# Patient Record
Sex: Male | Born: 1965
Health system: Southern US, Community
[De-identification: ages and names within clinical notes are randomized; demographics above are authoritative.]

## PROBLEM LIST (undated history)

## (undated) ENCOUNTER — Emergency Department (HOSPITAL_COMMUNITY): Admission: EM | Discharge status: Absent without leave | Payer: 59

## (undated) DIAGNOSIS — Z8659 Personal history of other mental and behavioral disorders: Secondary | ICD-10-CM

## (undated) DIAGNOSIS — E559 Vitamin D deficiency, unspecified: Secondary | ICD-10-CM

## (undated) DIAGNOSIS — G8929 Other chronic pain: Secondary | ICD-10-CM

## (undated) DIAGNOSIS — M5440 Lumbago with sciatica, unspecified side: Secondary | ICD-10-CM

## (undated) DIAGNOSIS — S069XAA Unspecified intracranial injury with loss of consciousness status unknown, initial encounter: Secondary | ICD-10-CM

## (undated) DIAGNOSIS — M549 Dorsalgia, unspecified: Secondary | ICD-10-CM

## (undated) DIAGNOSIS — S069X9A Unspecified intracranial injury with loss of consciousness of unspecified duration, initial encounter: Secondary | ICD-10-CM

## (undated) HISTORY — DX: Lumbago with sciatica, unspecified side: M54.40

## (undated) HISTORY — PX: BACK SURGERY: SHX140

---

## 2002-11-24 ENCOUNTER — Emergency Department (HOSPITAL_COMMUNITY): Admission: EM | Admit: 2002-11-24 | Discharge: 2002-11-25 | Payer: Self-pay | Admitting: Emergency Medicine

## 2002-11-30 ENCOUNTER — Emergency Department (HOSPITAL_COMMUNITY): Admission: EM | Admit: 2002-11-30 | Discharge: 2002-11-30 | Payer: Self-pay | Admitting: Emergency Medicine

## 2002-11-30 ENCOUNTER — Encounter: Payer: Self-pay | Admitting: Emergency Medicine

## 2004-08-14 ENCOUNTER — Emergency Department (HOSPITAL_COMMUNITY): Admission: EM | Admit: 2004-08-14 | Discharge: 2004-08-15 | Payer: Self-pay

## 2010-05-24 ENCOUNTER — Emergency Department (HOSPITAL_COMMUNITY): Admission: EM | Admit: 2010-05-24 | Discharge: 2010-05-24 | Payer: Self-pay | Admitting: Emergency Medicine

## 2010-05-29 ENCOUNTER — Emergency Department (HOSPITAL_COMMUNITY): Admission: EM | Admit: 2010-05-29 | Discharge: 2010-05-29 | Payer: Self-pay | Admitting: Emergency Medicine

## 2010-09-28 ENCOUNTER — Emergency Department (HOSPITAL_COMMUNITY)
Admission: EM | Admit: 2010-09-28 | Discharge: 2010-09-28 | Payer: 59 | Source: Home / Self Care | Admitting: Emergency Medicine

## 2012-06-11 ENCOUNTER — Emergency Department (HOSPITAL_COMMUNITY)
Admission: EM | Admit: 2012-06-11 | Discharge: 2012-06-11 | Disposition: A | Discharge status: Home Health | Payer: Worker's Compensation | Attending: Emergency Medicine | Admitting: Emergency Medicine

## 2012-06-11 ENCOUNTER — Emergency Department (HOSPITAL_COMMUNITY): Payer: Worker's Compensation

## 2012-06-11 ENCOUNTER — Encounter (HOSPITAL_COMMUNITY): Payer: Self-pay | Admitting: *Deleted

## 2012-06-11 DIAGNOSIS — F17200 Nicotine dependence, unspecified, uncomplicated: Secondary | ICD-10-CM | POA: Insufficient documentation

## 2012-06-11 DIAGNOSIS — S46912A Strain of unspecified muscle, fascia and tendon at shoulder and upper arm level, left arm, initial encounter: Secondary | ICD-10-CM

## 2012-06-11 DIAGNOSIS — W010XXA Fall on same level from slipping, tripping and stumbling without subsequent striking against object, initial encounter: Secondary | ICD-10-CM | POA: Insufficient documentation

## 2012-06-11 DIAGNOSIS — Y93G1 Activity, food preparation and clean up: Secondary | ICD-10-CM | POA: Insufficient documentation

## 2012-06-11 DIAGNOSIS — IMO0002 Reserved for concepts with insufficient information to code with codable children: Secondary | ICD-10-CM | POA: Insufficient documentation

## 2012-06-11 DIAGNOSIS — Y9269 Other specified industrial and construction area as the place of occurrence of the external cause: Secondary | ICD-10-CM | POA: Insufficient documentation

## 2012-06-11 MED ORDER — DIAZEPAM 5 MG PO TABS
5.0000 mg | ORAL_TABLET | Freq: Once | ORAL | Status: AC
  Administered 2012-06-11: 5 mg via ORAL
  Filled 2012-06-11: qty 1

## 2012-06-11 MED ORDER — IBUPROFEN 800 MG PO TABS
800.0000 mg | ORAL_TABLET | Freq: Once | ORAL | Status: AC
  Administered 2012-06-11: 800 mg via ORAL
  Filled 2012-06-11: qty 1

## 2012-06-11 MED ORDER — OXYCODONE-ACETAMINOPHEN 5-325 MG PO TABS
2.0000 | ORAL_TABLET | Freq: Once | ORAL | Status: AC
  Administered 2012-06-11: 2 via ORAL
  Filled 2012-06-11: qty 2

## 2012-06-11 MED ORDER — DIAZEPAM 5 MG PO TABS: 5.0000 mg | ORAL_TABLET | Freq: Three times a day (TID) | ORAL | Status: DC | PRN

## 2012-06-11 MED ORDER — IBUPROFEN 800 MG PO TABS: 800.0000 mg | ORAL_TABLET | Freq: Three times a day (TID) | ORAL | Status: DC

## 2012-06-11 MED ORDER — OXYCODONE-ACETAMINOPHEN 5-325 MG PO TABS: 2.0000 | ORAL_TABLET | ORAL | Status: DC | PRN

## 2012-06-11 NOTE — ED Provider Notes (Signed)
History     CSN: [id number]  Arrival date & time 06/11/12  0120   First MD Initiated Contact with Patient 06/11/12 0250      Chief Complaint  Patient presents with  . Shoulder Injury    (Consider location/radiation/quality/duration/timing/severity/associated sxs/prior treatment) HPI 46 year old male presents to the emergency department after having fall at work. Patient reports they were cleaning the kitchen when he slipped on some water landing on his left side. He is complaining of pain to his left shoulder, and left paraspinal upper back area. No numbness no weakness. Patient is right-hand dominant. Patient denies any numbness or tingling down to his arm. No shortness of breath no other injuries  History reviewed. No pertinent past medical history.  History reviewed. No pertinent past surgical history.  No family history on file.  History  Substance Use Topics  . Smoking status: Current Every Day Smoker  . Smokeless tobacco: Not on file  . Alcohol Use: No      Review of Systems  All other systems reviewed and are negative.    Allergies  Review of patient's allergies indicates no known allergies.  Home Medications   Current Outpatient Rx  Name Route Sig Dispense Refill  . DIAZEPAM 5 MG PO TABS Oral Take 1 tablet (5 mg total) by mouth every 8 (eight) hours as needed for anxiety (muscle spasm). 15 tablet 0  . IBUPROFEN 800 MG PO TABS Oral Take 1 tablet (800 mg total) by mouth 3 (three) times daily. 21 tablet 0  . OXYCODONE-ACETAMINOPHEN 5-325 MG PO TABS Oral Take 2 tablets by mouth every 4 (four) hours as needed for pain. 20 tablet 0    BP 155/82  Pulse 81  Temp 97.7 F (36.5 C) (Oral)  Resp 18  SpO2 95%  Physical Exam  Nursing note and vitals reviewed. Constitutional: He appears well-developed and well-nourished. He appears distressed (Uncomfortable appearing).  HENT:  Head: Normocephalic and atraumatic.  Nose: Nose normal.  Mouth/Throat: Oropharynx is  clear and moist.  Eyes: EOM are normal. Pupils are equal, round, and reactive to light.  Neck: Normal range of motion. Neck supple. No JVD present. No tracheal deviation present. No thyromegaly present.       Left sided paraspinal muscle tenderness with palpation and spasm noted no tenderness in midline, no step-off no crepitus  Pulmonary/Chest: No stridor.  Musculoskeletal: He exhibits tenderness (tenderness across left shoulder mainly across rotator cuff muscles. No bony tenderness, limited range of motion secondary to pain but no crepitus or deformity). He exhibits no edema.  Lymphadenopathy:    He has no cervical adenopathy.    ED Course  Procedures (including critical care time)  Labs Reviewed - No data to display Dg Shoulder Left  06/11/2012  *RADIOLOGY REPORT*  Clinical Data: Fall.  Landed on left shoulder.  Shoulder pain.  LEFT SHOULDER - 2+ VIEW  Comparison: None.  Findings: No evidence for fracture.  No findings to suggest shoulder separation or dislocation. No worrisome lytic or sclerotic osseous lesion.  IMPRESSION: No acute bony findings.   Original Report Authenticated By: ERIC A. MANSELL, M.D.      1. Left shoulder strain       MDM  46 year old male with left shoulder pain. Suspect shoulder strain after fall. We'll place in sling, give pain and spasm medication. Patient instructed to followup with his primary care doctor and/or orthopedics for further evaluation if symptoms are ongoing past a week time        [place]  Kellie Simmering, MD 06/11/12 [phone]

## 2012-06-11 NOTE — ED Notes (Signed)
Ortho paged for sling immobilizer  

## 2012-06-11 NOTE — ED Notes (Signed)
The pt fell earl;ier today and injured his lt shoulder.  C/o pain

## 2012-06-14 ENCOUNTER — Emergency Department (HOSPITAL_COMMUNITY): Payer: Worker's Compensation

## 2012-06-14 ENCOUNTER — Emergency Department (HOSPITAL_COMMUNITY)
Admission: EM | Admit: 2012-06-14 | Discharge: 2012-06-14 | Disposition: A | Discharge status: Home / Self Care | Payer: Worker's Compensation | Attending: Emergency Medicine | Admitting: Emergency Medicine

## 2012-06-14 ENCOUNTER — Encounter (HOSPITAL_COMMUNITY): Payer: Self-pay | Admitting: Emergency Medicine

## 2012-06-14 DIAGNOSIS — F17200 Nicotine dependence, unspecified, uncomplicated: Secondary | ICD-10-CM | POA: Insufficient documentation

## 2012-06-14 DIAGNOSIS — S161XXA Strain of muscle, fascia and tendon at neck level, initial encounter: Secondary | ICD-10-CM

## 2012-06-14 DIAGNOSIS — S139XXA Sprain of joints and ligaments of unspecified parts of neck, initial encounter: Secondary | ICD-10-CM | POA: Insufficient documentation

## 2012-06-14 DIAGNOSIS — W010XXA Fall on same level from slipping, tripping and stumbling without subsequent striking against object, initial encounter: Secondary | ICD-10-CM | POA: Insufficient documentation

## 2012-06-14 DIAGNOSIS — Y9269 Other specified industrial and construction area as the place of occurrence of the external cause: Secondary | ICD-10-CM | POA: Insufficient documentation

## 2012-06-14 MED ORDER — DIAZEPAM 5 MG PO TABS: 5.0000 mg | ORAL_TABLET | Freq: Three times a day (TID) | ORAL | Status: DC | PRN

## 2012-06-14 MED ORDER — IBUPROFEN 600 MG PO TABS: 600.0000 mg | ORAL_TABLET | Freq: Four times a day (QID) | ORAL | Status: DC | PRN

## 2012-06-14 NOTE — ED Notes (Signed)
Pt reports fell Tuesday night, pt seen here at that time. Pt reports only had his left shoulder x-rayed at that time. Pt c/o pain from low neck down spine. Pt also c/o pain to back of left leg.

## 2012-06-14 NOTE — ED Provider Notes (Signed)
History     CSN: [id number]  Arrival date & time 06/14/12  4098   First MD Initiated Contact with Patient 06/14/12 1009      Chief Complaint  Patient presents with  . Back Pain    (Consider location/radiation/quality/duration/timing/severity/associated sxs/prior treatment) HPI Comments: DOMENIQUE SOUTHERS is a 46 y.o. Male who presents with complaint of neck pain. Pt states he fell 3 days ago on some grease at work, states landed onto left shoulder states was seen here in ED, had x-rays done which were negative. States he also complained of neck pain but no x-rays were taken. States neck pain has been worsening since. States pain is midline radiating into right upper back. Pain worsened with movement of the head. Denies numbness or weakness in hands or legs. Taking percocet, valium, ibuprofen with no relief.    History reviewed. No pertinent past medical history.  History reviewed. No pertinent past surgical history.  No family history on file.  History  Substance Use Topics  . Smoking status: Current Every Day Smoker    Types: Cigarettes  . Smokeless tobacco: Not on file  . Alcohol Use: No      Review of Systems  Constitutional: Negative for fever and chills.  HENT: Positive for neck pain and neck stiffness.   Respiratory: Negative.   Cardiovascular: Negative.   Musculoskeletal: Positive for back pain.  Skin: Negative.   Neurological: Negative for dizziness, weakness, numbness and headaches.    Allergies  Review of patient's allergies indicates no known allergies.  Home Medications   Current Outpatient Rx  Name Route Sig Dispense Refill  . TENSION HEADACHE RELIEF PO Oral Take 1 tablet by mouth daily as needed. For headache    . DIAZEPAM 5 MG PO TABS Oral Take 5 mg by mouth every 8 (eight) hours as needed. For muscle spasms    . IBUPROFEN 800 MG PO TABS Oral Take 800 mg by mouth every 8 (eight) hours as needed. For pain    . OXYCODONE-ACETAMINOPHEN 5-325 MG PO  TABS Oral Take 2 tablets by mouth every 4 (four) hours as needed. For pain      BP 117/68  Pulse 70  Temp 97.8 F (36.6 C) (Oral)  Resp 20  SpO2 96%  Physical Exam  Constitutional: He is oriented to person, place, and time. He appears well-developed and well-nourished. No distress.  HENT:  Head: Normocephalic.  Eyes: Conjunctivae normal are normal.  Neck: Normal range of motion. Neck supple.       Cervical spine tender over midline and left paracervical muscles. No bruising, swelling, deformity  Cardiovascular: Normal rate, regular rhythm and normal heart sounds.   Pulmonary/Chest: Effort normal and breath sounds normal. No respiratory distress. He has no wheezes. He has no rales.  Musculoskeletal: He exhibits no edema.  Neurological: He is alert and oriented to person, place, and time.       Equal upper and lower extremity strength bilaterally. 5/5 and equal grip strength  Skin: Skin is warm and dry.  Psychiatric: He has a normal mood and affect.    ED Course  Procedures (including critical care time)  Pt with a fall 3 days ago. Here with neck pain. Will get x-rays. Pt is neurovascularly intact. Ambulatory. Ran out of pain meds at home.   No results found for this or any previous visit. Dg Cervical Spine Complete  06/14/2012  *RADIOLOGY REPORT*  Clinical Data: Back pain.  Neck pain.  Fell 4 days ago.  Weakness, numbness in the left hand, left leg pain.  CERVICAL SPINE - COMPLETE 4+ VIEW  Comparison: 05/24/2010  Findings: There are moderate degenerative changes in the mid cervical spine.  Disc height loss and osteophytes are present especially at C5-6 and C6-7.  There is no evidence for acute fracture or subluxation.  Lung apices are clear.  IMPRESSION: Moderate degenerative changes. No evidence for acute  abnormality.   Original Report Authenticated By: Patterson Hammersmith, M.D.    Negative c-spine except for probably degenerative disc disease. Pt to d/c home with more medications  and close follow up. He is neurovascularly intact.        1. Cervical strain       MDM          Lottie Mussel, PA 06/14/12 1641

## 2012-06-15 NOTE — ED Provider Notes (Signed)
Medical screening examination/treatment/procedure(s) were performed by non-physician practitioner and as supervising physician I was immediately available for consultation/collaboration.   Loren Racer, MD 06/15/12 0730

## 2012-11-15 ENCOUNTER — Encounter (HOSPITAL_COMMUNITY): Payer: Self-pay | Admitting: Emergency Medicine

## 2012-11-15 ENCOUNTER — Emergency Department (HOSPITAL_COMMUNITY)
Admission: EM | Admit: 2012-11-15 | Discharge: 2012-11-16 | Disposition: A | Discharge status: Home / Self Care | Payer: BC Managed Care – PPO | Attending: Emergency Medicine | Admitting: Emergency Medicine

## 2012-11-15 ENCOUNTER — Emergency Department (HOSPITAL_COMMUNITY): Payer: BC Managed Care – PPO

## 2012-11-15 DIAGNOSIS — W009XXA Unspecified fall due to ice and snow, initial encounter: Secondary | ICD-10-CM

## 2012-11-15 DIAGNOSIS — Y9289 Other specified places as the place of occurrence of the external cause: Secondary | ICD-10-CM | POA: Insufficient documentation

## 2012-11-15 DIAGNOSIS — F17200 Nicotine dependence, unspecified, uncomplicated: Secondary | ICD-10-CM | POA: Insufficient documentation

## 2012-11-15 DIAGNOSIS — W010XXA Fall on same level from slipping, tripping and stumbling without subsequent striking against object, initial encounter: Secondary | ICD-10-CM | POA: Insufficient documentation

## 2012-11-15 DIAGNOSIS — S99929A Unspecified injury of unspecified foot, initial encounter: Secondary | ICD-10-CM | POA: Insufficient documentation

## 2012-11-15 DIAGNOSIS — S99912A Unspecified injury of left ankle, initial encounter: Secondary | ICD-10-CM

## 2012-11-15 DIAGNOSIS — Y9329 Activity, other involving ice and snow: Secondary | ICD-10-CM | POA: Insufficient documentation

## 2012-11-15 DIAGNOSIS — S8990XA Unspecified injury of unspecified lower leg, initial encounter: Secondary | ICD-10-CM | POA: Insufficient documentation

## 2012-11-15 NOTE — ED Notes (Signed)
Pt c/o left ankle pain, reports he slipped on the ice today. Pt has swelling to left ankle. Pt in nad, skin warm and dry, resp e/u.

## 2012-11-15 NOTE — ED Provider Notes (Signed)
History    This chart was scribed for non-physician practitioner working with Vincent Skene, MD by [name], ED Scribe. This patient was seen in room TR10C/TR10C and the patient's care was started at 11:53 PM.    CSN: [id number]  Arrival date & time 11/15/12  5784   First MD Initiated Contact with Patient 11/15/12 2341      Chief Complaint  Patient presents with  . Fall     The history is provided by the patient. No language interpreter was used.  [name] is a 47 y.o. male who presents to the Emergency Department complaining of left ankle pain and swelling with sudden onset at 11:00 AM today when he slipped on ice and heard a "pop."  Pt has used ice and ibuprofen at home with no improvements to pain.  Unable to bear weight secondary to pain.  No further injuries as a result.     History reviewed. No pertinent past medical history.  History reviewed. No pertinent past surgical history.  No family history on file.  History  Substance Use Topics  . Smoking status: Current Every Day Smoker    Types: Cigarettes  . Smokeless tobacco: Not on file  . Alcohol Use: No      Review of Systems  Musculoskeletal: Positive for arthralgias (left ankle).  Skin: Negative for wound.  Neurological: Negative for numbness.  All other systems reviewed and are negative.    Allergies  Review of patient's allergies indicates no known allergies.  Home Medications   Current Outpatient Rx  Name  Route  Sig  Dispense  Refill  . ibuprofen (ADVIL,MOTRIN) 800 MG tablet   Oral   Take 800 mg by mouth every 8 (eight) hours as needed. For pain         . oxyCODONE-acetaminophen (PERCOCET/ROXICET) 5-325 MG per tablet   Oral   Take 2 tablets by mouth every 4 (four) hours as needed. For pain         . naproxen (NAPROSYN) 500 MG tablet   Oral   Take 1 tablet (500 mg total) by mouth 2 (two) times daily with a meal.   30 tablet   0   . oxyCODONE-acetaminophen (PERCOCET) 5-325 MG  per tablet   Oral   Take 1 tablet by mouth every 4 (four) hours as needed for pain.   10 tablet   0     BP 138/95  Pulse 84  Temp(Src) 98.5 F (36.9 C) (Oral)  Resp 18  SpO2 96%  Physical Exam  Nursing note and vitals reviewed. Constitutional: He is oriented to person, place, and time. He appears well-developed and well-nourished. No distress.  HENT:  Head: Normocephalic and atraumatic.  Eyes: Conjunctivae and EOM are normal.  Neck: Normal range of motion. Neck supple.  Cardiovascular: Normal rate, regular rhythm and normal heart sounds.   Intact distal pulses, capillary refill < 3 seconds  Pulmonary/Chest: Effort normal and breath sounds normal. No respiratory distress. He has no wheezes.  Musculoskeletal:  Swelling and pain to palpation on lateral malleolus, decreased ROM of left ankle secondary to pain, left knee good ROM.  Neurological: He is alert and oriented to person, place, and time.  No sensory deficit  Skin: Skin is warm. He is not diaphoretic.  Skin intact, no tenting  Psychiatric: He has a normal mood and affect. His behavior is normal.    ED Course  Procedures (including critical care time) DIAGNOSTIC STUDIES: Oxygen Saturation is 96% on room  air, adequate by my interpretation.    COORDINATION OF CARE: 11:56 PM- Patient informed of clinical course, understands medical decision-making process, and agrees with plan.   Dg Ankle Complete Left  11/15/2012  *RADIOLOGY REPORT*  Clinical Data: Fall, left ankle pain  LEFT ANKLE COMPLETE - 3+ VIEW  Comparison: Concurrently obtained radiographs of the left foot.  Findings: Soft tissue swelling is noted about the ankle, particularly about the medial malleolus.  No definite acute fracture, malalignment or ankle joint effusion.  Osseous mineralization is within normal limits.  IMPRESSION:  1.  No acute fracture, malalignment or joint effusion. 2.  Soft tissue swelling about the ankle anteriorly and around the medial  malleolus.   Original Report Authenticated By: [name], M.D.    Dg Foot Complete Left  11/15/2012  *RADIOLOGY REPORT*  Clinical Data: Fall, left ankle and foot pain  LEFT FOOT - COMPLETE 3+ VIEW  Comparison: Concurrently obtained radiographs of the ankle  Findings: No acute fracture, or malalignment.  Bony mineralization is within normal limits.  Soft tissue swelling noted anterior to the ankle joint.  Small plantar calcaneal spur.  IMPRESSION: No acute osseous abnormality.   Original Report Authenticated By: [name], M.D.      1. Fall from slipping on ice, initial encounter   2. Ankle injury, left, initial encounter       MDM  [name] presents with fall and L ankle pain.  Patient X-Ray negative for obvious fracture or dislocation. Pain managed in ED. Pt advised to follow up with orthopedics if symptoms persist for possibility of missed fracture diagnosis. Patient given brace while in ED, conservative therapy recommended and discussed. Patient will be dc home & is agreeable with above plan.  1. Medications: naproxyn, vicodin, usual home medications 2. Treatment: rest, drink plenty of fluids, compress, elevate, ice 3. Follow Up: Please followup with your primary doctor for discussion of your diagnoses and further evaluation after today's visit; if you do not have a primary care doctor use the resource guide provided to find one; follow-up with orthopedics  I personally performed the services described in this documentation, which was scribed in my presence. The recorded information has been reviewed and is accurate.        Vincent Client Muthersbaugh, PA-C 11/16/12 0021

## 2012-11-15 NOTE — ED Notes (Signed)
Patient here for fall, patient fell this morning at 11.  Patient now having foot and ankle pain on the left.  Swelling noted to left foot.

## 2012-11-16 MED ORDER — POTASSIUM CHLORIDE CRYS ER 20 MEQ PO TBCR
EXTENDED_RELEASE_TABLET | ORAL | Status: AC
  Filled 2012-11-16: qty 2

## 2012-11-16 MED ORDER — NAPROXEN 500 MG PO TABS: 500.0000 mg | ORAL_TABLET | Freq: Two times a day (BID) | ORAL | Status: DC

## 2012-11-16 MED ORDER — OXYCODONE-ACETAMINOPHEN 5-325 MG PO TABS
2.0000 | ORAL_TABLET | Freq: Once | ORAL | Status: AC
  Administered 2012-11-16: 2 via ORAL
  Filled 2012-11-16: qty 2

## 2012-11-16 MED ORDER — OXYCODONE-ACETAMINOPHEN 5-325 MG PO TABS: 1.0000 | ORAL_TABLET | ORAL | Status: DC | PRN

## 2012-11-16 NOTE — ED Notes (Signed)
Pt noted to be walking out once the ortho tech fitted him with his ankle brace and crutches.  This RN asked the pt what was wrong.  Pt stated he needed to go "I have been here for 9 hours.  I have to go.  I can't stay here anymore." RN noted pt was up for discharge.  Discharge paperwork given to pt.  Pt verbalized understanding of discharge and f/u; no additional questions by pt regarding d/c; e-signature obtained; resps e/u;

## 2012-11-18 NOTE — ED Provider Notes (Signed)
Medical screening examination/treatment/procedure(s) were performed by non-physician practitioner and as supervising physician I was immediately available for consultation/collaboration.  John-Adam Dolton Shaker, M.D.     John-Adam Jettson Crable, MD 11/18/12 0728 

## 2012-11-25 ENCOUNTER — Ambulatory Visit: Payer: Self-pay | Admitting: Emergency Medicine

## 2013-11-04 ENCOUNTER — Encounter (HOSPITAL_COMMUNITY): Payer: Self-pay | Admitting: Emergency Medicine

## 2013-11-04 ENCOUNTER — Emergency Department (HOSPITAL_COMMUNITY)
Admission: EM | Admit: 2013-11-04 | Discharge: 2013-11-04 | Disposition: A | Discharge status: Home / Self Care | Payer: PRIVATE HEALTH INSURANCE | Attending: Emergency Medicine | Admitting: Emergency Medicine

## 2013-11-04 ENCOUNTER — Emergency Department (HOSPITAL_COMMUNITY): Payer: PRIVATE HEALTH INSURANCE

## 2013-11-04 DIAGNOSIS — Z8639 Personal history of other endocrine, nutritional and metabolic disease: Secondary | ICD-10-CM | POA: Insufficient documentation

## 2013-11-04 DIAGNOSIS — S39012A Strain of muscle, fascia and tendon of lower back, initial encounter: Secondary | ICD-10-CM

## 2013-11-04 DIAGNOSIS — S335XXA Sprain of ligaments of lumbar spine, initial encounter: Secondary | ICD-10-CM | POA: Insufficient documentation

## 2013-11-04 DIAGNOSIS — Z791 Long term (current) use of non-steroidal anti-inflammatories (NSAID): Secondary | ICD-10-CM | POA: Insufficient documentation

## 2013-11-04 DIAGNOSIS — Y9389 Activity, other specified: Secondary | ICD-10-CM | POA: Insufficient documentation

## 2013-11-04 DIAGNOSIS — IMO0002 Reserved for concepts with insufficient information to code with codable children: Secondary | ICD-10-CM

## 2013-11-04 DIAGNOSIS — Y92410 Unspecified street and highway as the place of occurrence of the external cause: Secondary | ICD-10-CM | POA: Insufficient documentation

## 2013-11-04 DIAGNOSIS — S239XXA Sprain of unspecified parts of thorax, initial encounter: Secondary | ICD-10-CM | POA: Insufficient documentation

## 2013-11-04 DIAGNOSIS — F17200 Nicotine dependence, unspecified, uncomplicated: Secondary | ICD-10-CM | POA: Insufficient documentation

## 2013-11-04 DIAGNOSIS — Z862 Personal history of diseases of the blood and blood-forming organs and certain disorders involving the immune mechanism: Secondary | ICD-10-CM | POA: Insufficient documentation

## 2013-11-04 HISTORY — DX: Vitamin D deficiency, unspecified: E55.9

## 2013-11-04 MED ORDER — HYDROCODONE-ACETAMINOPHEN 5-325 MG PO TABS: 1.0000 | ORAL_TABLET | Freq: Four times a day (QID) | ORAL | Status: DC | PRN

## 2013-11-04 MED ORDER — IBUPROFEN 800 MG PO TABS: 800.0000 mg | ORAL_TABLET | Freq: Three times a day (TID) | ORAL | Status: DC | PRN
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
## 2013-11-04 NOTE — ED Provider Notes (Signed)
Medical screening examination/treatment/procedure(s) were performed by non-physician practitioner and as supervising physician I was immediately available for consultation/collaboration.  EKG Interpretation   None         Delice Bison Ahja Martello, DO 11/04/13 1352

## 2013-11-04 NOTE — Discharge Instructions (Signed)
Return here as needed. Follow up with your doctor. Ice and heat on your back. Your x-rays were normal.
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
## 2015-11-02 ENCOUNTER — Ambulatory Visit (INDEPENDENT_AMBULATORY_CARE_PROVIDER_SITE_OTHER): Payer: 59 | Admitting: Diagnostic Neuroimaging

## 2015-11-02 ENCOUNTER — Encounter: Payer: Self-pay | Admitting: Diagnostic Neuroimaging

## 2015-11-02 ENCOUNTER — Telehealth: Payer: Self-pay | Admitting: Diagnostic Neuroimaging

## 2015-11-02 VITALS — BP 125/75 | HR 70 | Ht 65.5 in | Wt 196.0 lb

## 2015-11-02 DIAGNOSIS — H538 Other visual disturbances: Secondary | ICD-10-CM | POA: Diagnosis not present

## 2015-11-02 DIAGNOSIS — H532 Diplopia: Secondary | ICD-10-CM

## 2015-11-02 DIAGNOSIS — M5441 Lumbago with sciatica, right side: Secondary | ICD-10-CM | POA: Diagnosis not present

## 2015-11-02 DIAGNOSIS — R202 Paresthesia of skin: Secondary | ICD-10-CM

## 2015-11-02 DIAGNOSIS — M5442 Lumbago with sciatica, left side: Secondary | ICD-10-CM | POA: Diagnosis not present

## 2015-11-02 DIAGNOSIS — R29898 Other symptoms and signs involving the musculoskeletal system: Secondary | ICD-10-CM | POA: Diagnosis not present

## 2015-11-02 DIAGNOSIS — R200 Anesthesia of skin: Secondary | ICD-10-CM

## 2015-11-02 DIAGNOSIS — R208 Other disturbances of skin sensation: Secondary | ICD-10-CM

## 2015-11-02 MED ORDER — ALPRAZOLAM 0.5 MG PO TABS
0.5000 mg | ORAL_TABLET | ORAL | Status: DC | PRN
Start: 2015-11-02 — End: 2017-01-12

## 2015-11-02 MED ORDER — GABAPENTIN 300 MG PO CAPS: 300.0000 mg | ORAL_CAPSULE | Freq: Three times a day (TID) | ORAL | Status: DC

## 2015-11-02 NOTE — Telephone Encounter (Signed)
Patient called, was seen today by Dr. Leta Baptist, forgot to ask about getting a back brace to help him while sitting and/or standing.

## 2015-11-02 NOTE — Patient Instructions (Signed)
Thank you for coming to see [place] at [facility]. I hope we have been able to provide you high quality care today.  You may receive a patient satisfaction survey over the next few weeks. We would appreciate your feedback and comments so that we may continue to improve ourselves and the health of our patients.  - I will check additional testing (MRI scans, labs) - I will send you for physical therapy evaluation - try gabapentin 387m at bedtime; increase up to three times per day   ~~~~~~~~~~~~~~~~~~~~~~~~~~~~~~~~~~~~~~~~~~~~~~~~~~~~~~~~~~~~~~~~~  DR. Laresa Oshiro'S GUIDE TO HAPPY AND HEALTHY LIVING These are some of my general health and wellness recommendations. Some of them may apply to you better than others. Please use common sense as you try these suggestions and feel free to ask me any questions.   ACTIVITY/FITNESS Mental, social, emotional and physical stimulation are very important for brain and body health. Try learning a new activity (arts, music, language, sports, games).  Keep moving your body to the best of your abilities. You can do this at home, inside or outside, the park, community center, gym or anywhere you like. Consider a physical therapist or personal trainer to get started. Consider the app Sworkit. Fitness trackers such as smart-watches, smart-phones or Fitbits can help as well.   NUTRITION Eat more plants: colorful vegetables, nuts, seeds and berries.  Eat less sugar, salt, preservatives and processed foods.  Avoid toxins such as cigarettes and alcohol.  Drink water when you are thirsty. Warm water with a slice of lemon is an excellent morning drink to start the day.  Consider these websites for more information The Nutrition Source (hhttps://www.henry-hernandez.biz/ [employer] Nutrition (wWindowBlog.ch   RELAXATION Consider practicing mindfulness meditation or other relaxation techniques such as deep  breathing, prayer, yoga, tai chi, massage. See website mindful.org or the apps Headspace or Calm to help get started.   SLEEP Try to get at least 7-8+ hours sleep per day. Regular exercise and reduced caffeine will help you sleep better. Practice good sleep hygeine techniques. See website sleep.org for more information.   PLANNING Prepare estate planning, living will, healthcare POA documents. Sometimes this is best planned with the help of an attorney. Theconversationproject.org and agingwithdignity.org are excellent resources.

## 2015-11-02 NOTE — Telephone Encounter (Signed)
SPoke with patient and informed him Dr Leta Baptist doesn't recommend back brace. Patient stated "back brace takes pressure off his back". Dr Leta Baptist stated he would recommend only if PT recommends. Patient stated he has already received call from PT and will discuss with PT when he sees them. He verbalized understanding, appreciation for call.

## 2015-11-02 NOTE — Progress Notes (Signed)
[facility]  PATIENT: [name] DOB: Jul 26, 1966  REFERRING CLINICIAN: Othelia Pulling HISTORY FROM: patient  REASON FOR VISIT: new consult    HISTORICAL  CHIEF COMPLAINT:  Chief Complaint  Patient presents with  . Lumbago with sciatica, left side    rm 7, New Patient    HISTORY OF PRESENT ILLNESS:   50 year old right-handed male here for evaluation of low back pain. Symptoms of been going on since 2011 or 2012. He would have intermittent flareups of symptoms once or twice a month which would be self-limited and resolve. Sometimes he would have pain rating his left leg, sometimes right leg. Sometimes he would have weakness in his legs lasting 1-2 months at time and then resolve. Over time he has noticed some intermittent symptoms of blurred vision, especially in the right eye. Sometimes he sees double vision. Sometimes he has to look at the TV using his left eye with his head tilted to the right. Patient has also noticed decreased sensation and numbness in his right arm and right leg. Patient was evaluated by neurology in [place] a few years ago, recommended to have MRI testing and further workup but patient never followed up as patient moved to [place].  Symptoms are worse if he is sitting for a long time and then he has to stand up and walk around. Patient having significant pain at this time. Patient was treated with intramuscular injection of steroid and pain reliever.  Patient reports remote history of low back disc herniation he thinks L5 level, with pain rating left leg, status post discectomy in 1980's, with good results.   REVIEW OF SYSTEMS: Full 14 system review of systems performed and notable only for numbness weakness joint pain aching muscles.  ALLERGIES: No Known Allergies  HOME MEDICATIONS: Outpatient Prescriptions Prior to Visit  Medication Sig Dispense Refill  . Cholecalciferol (VITAMIN D PO) Take 1 capsule by mouth daily.  Reported on 11/02/2015    . HYDROcodone-acetaminophen (NORCO/VICODIN) 5-325 MG per tablet Take 1 tablet by mouth every 6 (six) hours as needed for moderate pain. (Patient not taking: Reported on 11/02/2015) 15 tablet 0  . ibuprofen (ADVIL,MOTRIN) 800 MG tablet Take 1 tablet (800 mg total) by mouth every 8 (eight) hours as needed. (Patient not taking: Reported on 11/02/2015) 21 tablet 0  . Menthol, Topical Analgesic, (BIOFREEZE EX) Apply 1 application topically 2 (two) times daily as needed (back pain).    . Naproxen Sodium (ALEVE PO) Take 2 tablets by mouth daily as needed.     No facility-administered medications prior to visit.    PAST MEDICAL HISTORY: Past Medical History  Diagnosis Date  . Vitamin D deficiency   . Lumbago with sciatica     left side    PAST SURGICAL HISTORY: Past Surgical History  Procedure Laterality Date  . Back surgery      yrs ago    FAMILY HISTORY: Family History  Problem Relation Age of Onset  . Hypothyroidism Mother   . Hypertension Father     SOCIAL HISTORY:  Social History   Social History  . Marital Status: Married    Spouse Name: [name]  . Number of Children: 4  . Years of Education: 12   Occupational History  .      unemployed   Social History Main Topics  . Smoking status: Current Every Day Smoker    Types: Cigarettes  . Smokeless tobacco: Not on file     Comment: 11/02/15 3-4 cigs/day,  trying to quit  . Alcohol Use: No  . Drug Use: No  . Sexual Activity: Not on file   Other Topics Concern  . Not on file   Social History Narrative   Lives at home wife, children   Caffeine use- coffee 2-3 cups      PHYSICAL EXAM  GENERAL EXAM/CONSTITUTIONAL: Vitals:  Filed Vitals:   11/02/15 0820  BP: 125/75  Pulse: 70  Height: 5' 5.5" (1.664 m)  Weight: 196 lb (88.905 kg)     Body mass index is 32.11 kg/(m^2).  Visual Acuity Screening   Right eye Left eye Both eyes  Without correction: 20/50 20/50   With correction:         Patient is in no distress; well developed, nourished and groomed  [place] FLEX  CARDIOVASCULAR:  Examination of carotid arteries is normal; no carotid bruits  Regular rate and rhythm, no murmurs  Examination of peripheral vascular system by observation and palpation is normal  EYES:  Ophthalmoscopic exam of optic discs and posterior segments is normal; no papilledema or hemorrhages  MUSCULOSKELETAL:  Gait, strength, tone, movements noted in Neurologic exam below  NEUROLOGIC: MENTAL STATUS:  No flowsheet data found.  awake, alert, oriented to person, place and time  recent and remote memory intact  normal attention and concentration  language fluent, comprehension intact, naming intact,   fund of knowledge appropriate  CRANIAL NERVE:   2nd - no papilledema on fundoscopic exam  2nd, 3rd, 4th, 6th - pupils equal and reactive to light, visual fields full to confrontation, extraocular muscles --> HORIZONTAL BINOCULAR DOUBLE VISION IN PRIMARY GAZE; RIGHT AND LEFT LATERAL GAZE NORMAL; no nystagmus; LIGHT SENSITIVITY IN RIGHT EYE; BLURRED VISION IN RIGHT EYE  5th - facial sensation symmetric  7th - facial strength symmetric  8th - hearing intact  9th - palate elevates symmetrically, uvula midline  11th - shoulder shrug symmetric  12th - tongue protrusion midline  MOTOR:   normal bulk; DECR TONE IN BLE; full strength in the BUE EXCEPT DECR RIGHT HAND FINGER ABDUCTION; BLE (HIP FLEX 3; KNEE EXT 3-4; KNEE FLEX 3-4; RIGHT FOOT DORSIFLEX 2; LEFT FOOT DORSIFLEX 3; RIGHT TOE EXT 0; LEFT TOE EXT 2)  SENSORY:   DECR IN RUE COMPARED TO LUE; SIG DECR IN BLE (RIGHT FOOT WORSE THAN LEFT) TO ALL MODALITIES  COORDINATION:   finger-nose-finger, fine finger movements normal  REFLEXES:   deep tendon reflexes --> BUE 1; BLE 0; DOWN GOING TOES  GAIT/STATION:   ANTALGIC GAIT; CANNOT WALK ON TOES OR HEELS; romberg is  negative    DIAGNOSTIC DATA (LABS, IMAGING, TESTING) - I reviewed patient records, labs, notes, testing and imaging myself where available.  No results found for: WBC, HGB, HCT, MCV, PLT No results found for: NA, K, CL, CO2, GLUCOSE, BUN, CREATININE, CALCIUM, PROT, ALBUMIN, AST, ALT, ALKPHOS, BILITOT, GFRNONAA, GFRAA No results found for: CHOL, HDL, LDLCALC, LDLDIRECT, TRIG, CHOLHDL No results found for: HGBA1C No results found for: VITAMINB12 No results found for: TSH   11/04/13 xray lumbar [I reviewed images myself and agree with interpretation. -VRP]  1. Lower lumbar spondylosis and degenerative disc disease. Stable mild retrolisthesis at L3-4 and L4-5. No acute bony findings.  11/04/13 xray thoracic [I reviewed images myself and agree with interpretation. -VRP]  1. No acute thoracic spine findings. Thoracic spondylosis and degenerative disc disease noted. 2. Probably incidental exaggerated curvature of the right clavicle. If the patient has pain over the right  clavicle then dedicated shoulder or clavicular imaging would be recommended.      ASSESSMENT AND PLAN  50 y.o. year old male here with history of lumbar spine disc herniation status post surgery in the 1980s, now with increasing pain in the low back radiating to left leg since 2012. Patient also reports right eye blurred vision, binocular double vision, right arm and leg numbness, and intermittent lower extremity weakness episodes lasting 1-2 months at a time. Symptoms suspicious for central nervous system pathology in addition to lumbar spine pathology. Will check additional testing with MRI of the brain and lumbar spine. Will check additional lab testing as well. Symptomatically we will try to help patient with gabapentin and physical therapy evaluation.   Ddx: lumbar radiculopathy / spinal stenosis, neuropathy, cervical radiculopathy, CNS autoimmune/inflamm, metabolic  1. Bilateral low back pain with sciatica, sciatica  laterality unspecified   2. Blurred vision, right eye   3. Weakness of both lower extremities   4. Lower extremity numbness   5. Numbness and tingling in right hand   6. Double vision with both eyes open      PLAN: - I will check additional testing (MRI scans, labs) - I will send you for physical therapy evaluation - try gabapentin 300mg  at bedtime; increase up to three times per day   Orders Placed This Encounter  Procedures  . MR Brain W Wo Contrast  . MR Lumbar Spine Wo Contrast  . CBC with Differential/Platelet  . Comprehensive metabolic panel  . Hemoglobin A1c  . Vitamin B12  . VITAMIN D 25 Hydroxy (Vit-D Deficiency, Fractures)  . TSH  . Ambulatory referral to Physical Therapy   Meds ordered this encounter  Medications  . gabapentin (NEURONTIN) 300 MG capsule    Sig: Take 1 capsule (300 mg total) by mouth 3 (three) times daily.    Dispense:  90 capsule    Refill:  11  . ALPRAZolam (XANAX) 0.5 MG tablet    Sig: Take 1 tablet (0.5 mg total) by mouth as needed for anxiety (for sedation before MRI scan; take 1 hour before scan; may repeat 15 min before scan).    Dispense:  3 tablet    Refill:  0   Return in about 1 month (around 11/30/2015).  I reviewed images, labs, notes, records myself. I summarized findings and reviewed with patient, for this high risk condition (low back pain; lower ext weakness; blurred vision; double vision; possible CNS pathology) requiring high complexity decision making.    Penni Bombard, MD [id number], [id number] AM Certified in Neurology, Neurophysiology and Neuroimaging  [facility] [address], [place], [place] [address] [phone]

## 2015-11-03 ENCOUNTER — Telehealth: Payer: Self-pay | Admitting: *Deleted

## 2015-11-03 LAB — CBC WITH DIFFERENTIAL/PLATELET
BASOS ABS: 0 10*3/uL (ref 0.0–0.2)
Basos: 0 %
EOS (ABSOLUTE): 0.2 10*3/uL (ref 0.0–0.4)
Eos: 3 %
Hematocrit: 44.1 % (ref 37.5–51.0)
Hemoglobin: 14.6 g/dL (ref 12.6–17.7)
IMMATURE GRANS (ABS): 0 10*3/uL (ref 0.0–0.1)
Immature Granulocytes: 0 %
LYMPHS ABS: 2.7 10*3/uL (ref 0.7–3.1)
LYMPHS: 47 %
MCH: 28 pg (ref 26.6–33.0)
MCHC: 33.1 g/dL (ref 31.5–35.7)
MCV: 85 fL (ref 79–97)
MONOCYTES: 11 %
Monocytes Absolute: 0.6 10*3/uL (ref 0.1–0.9)
NEUTROS ABS: 2.3 10*3/uL (ref 1.4–7.0)
Neutrophils: 39 %
PLATELETS: 243 10*3/uL (ref 150–379)
RBC: 5.21 x10E6/uL (ref 4.14–5.80)
RDW: 14.9 % (ref 12.3–15.4)
WBC: 5.8 10*3/uL (ref 3.4–10.8)

## 2015-11-03 LAB — COMPREHENSIVE METABOLIC PANEL
A/G RATIO: 1.7 (ref 1.1–2.5)
ALBUMIN: 4.5 g/dL (ref 3.5–5.5)
ALT: 26 IU/L (ref 0–44)
AST: 17 IU/L (ref 0–40)
Alkaline Phosphatase: 78 IU/L (ref 39–117)
BILIRUBIN TOTAL: 0.6 mg/dL (ref 0.0–1.2)
BUN / CREAT RATIO: 15 (ref 9–20)
BUN: 13 mg/dL (ref 6–24)
CHLORIDE: 99 mmol/L (ref 96–106)
CO2: 25 mmol/L (ref 18–29)
Calcium: 9.3 mg/dL (ref 8.7–10.2)
Creatinine, Ser: 0.84 mg/dL (ref 0.76–1.27)
GFR calc non Af Amer: 104 mL/min/{1.73_m2} (ref >59)
GFR, EST AFRICAN AMERICAN: 120 mL/min/{1.73_m2} (ref >59)
GLOBULIN, TOTAL: 2.6 g/dL (ref 1.5–4.5)
GLUCOSE: 99 mg/dL (ref 65–99)
POTASSIUM: 4.6 mmol/L (ref 3.5–5.2)
Sodium: 138 mmol/L (ref 134–144)
TOTAL PROTEIN: 7.1 g/dL (ref 6.0–8.5)

## 2015-11-03 LAB — HEMOGLOBIN A1C
Est. average glucose Bld gHb Est-mCnc: 123 mg/dL
Hgb A1c MFr Bld: 5.9 % — ABNORMAL HIGH (ref 4.8–5.6)

## 2015-11-03 LAB — VITAMIN B12: Vitamin B-12: 206 pg/mL — ABNORMAL LOW (ref 211–946)

## 2015-11-03 LAB — VITAMIN D 25 HYDROXY (VIT D DEFICIENCY, FRACTURES): Vit D, 25-Hydroxy: 13.9 ng/mL — ABNORMAL LOW (ref 30.0–100.0)

## 2015-11-03 LAB — TSH: TSH: 1.5 u[IU]/mL (ref 0.450–4.500)

## 2015-11-03 NOTE — Telephone Encounter (Signed)
Spoke with patient and informed him per Dr Leta Baptist, his lab results are normal with these exceptions: Vit B12 level low, Vit D low, Hgb A1c elevated. Advised he take multivitamin or Vit B12 OTC and begin taking OTC Vit D 1000 units daily,  He has appointment with PCP next month and will discuss lab results with PCP then. PCP is in [facility]. Patient verbalized understanding, appreciation.

## 2015-11-09 ENCOUNTER — Ambulatory Visit: Payer: 59 | Attending: Diagnostic Neuroimaging | Admitting: Physical Therapy

## 2015-11-09 DIAGNOSIS — R269 Unspecified abnormalities of gait and mobility: Secondary | ICD-10-CM | POA: Insufficient documentation

## 2015-11-09 DIAGNOSIS — M5440 Lumbago with sciatica, unspecified side: Secondary | ICD-10-CM | POA: Diagnosis not present

## 2015-11-09 DIAGNOSIS — R29898 Other symptoms and signs involving the musculoskeletal system: Secondary | ICD-10-CM | POA: Insufficient documentation

## 2015-11-09 NOTE — Therapy (Signed)
[facility] [address] [place], [place], [address] Phone: [phone]   Fax:  [phone]  Physical Therapy Evaluation  Patient Details  Name: [name] MRN: [id number] Date of Birth: 07/15/1966 Referring Provider: Dr. Leta Baptist  Encounter Date: 11/09/2015      PT End of Session - 11/09/15 1220    Visit Number 1   Number of Visits 12   Date for PT Re-Evaluation 12/21/15   PT Start Time 1110  pt arrived late   PT Stop Time 1145   PT Time Calculation (min) 35 min   Activity Tolerance Patient tolerated treatment well   Behavior During Therapy [facility] for tasks assessed/performed      Past Medical History  Diagnosis Date  . Vitamin D deficiency   . Lumbago with sciatica     left side    Past Surgical History  Procedure Laterality Date  . Back surgery      yrs ago    There were no vitals filed for this visit.  Visit Diagnosis:  Bilateral low back pain with sciatica, sciatica laterality unspecified, unspecified chronicity - Plan: PT plan of care cert/re-cert  Weakness of both lower extremities - Plan: PT plan of care cert/re-cert  Abnormality of gait - Plan: PT plan of care cert/re-cert      Subjective Assessment - 11/09/15 1114    Subjective Pt is a 50 y/o male who presents to OPPT for constant LBP x 3-4 weeks.  Pt c/o pain radiating in both legs with LLE worse and constant.  Pt reports 10 year hx of intermittent back pain but reports able to remain active.  Pt reports PT for back "years ago."   Pertinent History chronic LBP   Limitations Sitting;Standing;Walking;House hold activities   How long can you sit comfortably? 20-30 min   How long can you stand comfortably? "I can stand longer than I can sit." 30-45 min   How long can you walk comfortably? about 1/4 mile   Diagnostic tests MRI brain and lumbar spine scheduled   Patient Stated Goals remind pt how to use body to ease pain; inquiring about  back brace as this has worked in the past; improve pain, return to gym   Currently in Pain? Yes   Pain Score 3    Pain Location Back   Pain Orientation Left;Right;Mid;Lower  L>R   Pain Descriptors / Indicators Numbness   Pain Type Chronic pain   Pain Radiating Towards c/o RLE numbness; and LLE pain   Pain Onset More than a month ago  with acute exacerbation   Pain Frequency Intermittent   Aggravating Factors  "just trying to get comfortable" sitting, standing, lifting, bending   Pain Relieving Factors lying on back; massage/heat pad            [facility] PT Assessment - 11/09/15 1121    Assessment   Medical Diagnosis LBP   Referring Provider Dr. Leta Baptist   Onset Date/Surgical Date --  10 yr hx with 3-4 week exacerbation   Next MD Visit 11/30/15   Prior Therapy "years ago"   Precautions   Precautions None   Restrictions   Weight Bearing Restrictions No   Balance Screen   Has the patient fallen in the past 6 months No   Has the patient had a decrease in activity level because of a fear of falling?  Yes   Is the patient reluctant to leave their home because of a fear of falling?  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[employer]; catering orders, etc   Leisure fishing   Cognition   Overall Cognitive Status Within Functional Limits for tasks assessed   Attention Selective   Selective Attention Appears intact   Observation/Other Assessments   Focus on Therapeutic Outcomes (FOTO)  54 (46% limited; predicted 36% limited)   Oswestry Disability Index  56%   Posture/Postural Control   Posture/Postural Control Postural limitations   Postural Limitations Rounded Shoulders;Forward  head   Posture Comments pt frequently readjusting while sitting on mat table   AROM   AROM Assessment Site Lumbar   Lumbar Flexion 37   Lumbar Extension 15   Lumbar - Right Side Bend 19   Lumbar - Left Side Bend 32   Strength   Strength Assessment Site Hip;Knee;Ankle   Right Hip Flexion 3-/5   Right Hip Extension 3-/5   Right Hip ABduction 3-/5   Left Hip Flexion 4/5   Left Hip Extension 3/5   Left Hip ABduction 3/5   Right/Left Knee Right;Left   Right Knee Flexion 3+/5   Right Knee Extension 4/5   Left Knee Flexion 3+/5   Left Knee Extension 5/5   Right Ankle Dorsiflexion 3-/5   Left Ankle Dorsiflexion 4/5   Palpation   Palpation comment pt with significant tenderness to light palpation in low back and bil hips (L>R)   Special Tests    Special Tests Lumbar   Lumbar Tests Straight Leg Raise   Straight Leg Raise   Findings Positive   Side  Left  and Rt   Comment increased pain worse on L than R   Ambulation/Gait   Ambulation/Gait Yes   Ambulation/Gait Assistance 6: Modified independent (Device/Increase time)   Ambulation Distance (Feet) 150 Feet   Assistive device None   Gait Pattern Decreased stance time - left;Decreased dorsiflexion - right;Trendelenburg;Lateral hip instability;Lateral trunk lean to left  R foot slap   Gait velocity 2.86 ft/sec                           PT Education - 11/09/15 1219    Education provided Yes   Education Details clinical findings; POC, transfer to [employer], hamstring stretch   Person(s) Educated Patient   Methods Explanation;Demonstration;Handout             PT Long Term Goals - 11/09/15 1250    PT LONG TERM GOAL #1   Title independent with HEP (12/21/15)   Time 6   Period Weeks   Status New   PT LONG TERM GOAL #2   Title verbalize understanding of posture/body mechanics to decrease risk of reinjury (12/21/15)   Time 6   Period Weeks   Status New   PT LONG TERM GOAL #3   Title report ability to walk >  1/2 mile (~30 min) without increase in pain for improved function (12/21/15)   Time 6   Period Weeks   Status New   PT LONG TERM GOAL #4   Title improve lumbar ROM by 10 degrees forward flexion for improved mobility (12/21/15)   Time  6   Period Weeks   Status New               Plan - 11/09/15 1240    Clinical Impression Statement Pt is a 50 y/o male who presents to OPPT for moderate complexity evaluation of LBP.  Pt's reports 10 year hx of LBP with recent exacerbation resulting in RLE weakness and LLE pain with unknown mechanism of injury.  Pt demonstrates gait abnormalities, weakness, decreased ROM and decreased sitting and standing tolerance and functional mobility.  Pt will benefit from PT to maximize function and improve mobility and strength.   Pt will benefit from skilled therapeutic intervention in order to improve on the following deficits Abnormal gait;Decreased strength;Decreased mobility;Postural dysfunction;Pain;Decreased range of motion   Rehab Potential Fair   PT Frequency 2x / week   PT Duration 6 weeks   PT Treatment/Interventions ADLs/Self Care Home Management;Electrical Stimulation;Cryotherapy;Moist Heat;Therapeutic exercise;Therapeutic activities;Functional mobility training;Gait training;Ultrasound;Traction;Neuromuscular re-education;Balance training;Patient/family education;Manual techniques;Dry needling;Passive range of motion   PT Next Visit Plan modalities PRN, HEP for strengthening/stretching, traction   Consulted and Agree with Plan of Care Patient         Problem List There are no active problems to display for this patient.  Laureen Abrahams, PT, DPT 11/09/2015 12:54 PM  [facility] [address] [place], [place], [address] Phone: [phone]   Fax:  [phone]  Name: [name] MRN: [id number] Date of Birth: [birth year]

## 2015-11-14 ENCOUNTER — Ambulatory Visit
Admission: RE | Admit: 2015-11-14 | Discharge: 2015-11-14 | Disposition: A | Discharge status: Home / Self Care | Payer: 59 | Source: Ambulatory Visit | Attending: Diagnostic Neuroimaging | Admitting: Diagnostic Neuroimaging

## 2015-11-14 DIAGNOSIS — H538 Other visual disturbances: Secondary | ICD-10-CM

## 2015-11-14 DIAGNOSIS — R200 Anesthesia of skin: Secondary | ICD-10-CM

## 2015-11-14 DIAGNOSIS — M5441 Lumbago with sciatica, right side: Secondary | ICD-10-CM | POA: Diagnosis not present

## 2015-11-14 DIAGNOSIS — R202 Paresthesia of skin: Secondary | ICD-10-CM

## 2015-11-14 DIAGNOSIS — M5442 Lumbago with sciatica, left side: Principal | ICD-10-CM

## 2015-11-14 DIAGNOSIS — R29898 Other symptoms and signs involving the musculoskeletal system: Secondary | ICD-10-CM

## 2015-11-14 DIAGNOSIS — H532 Diplopia: Secondary | ICD-10-CM

## 2015-11-14 MED ORDER — GADOBENATE DIMEGLUMINE 529 MG/ML IV SOLN
18.0000 mL | Freq: Once | INTRAVENOUS | Status: AC | PRN
  Administered 2015-11-14: 18 mL via INTRAVENOUS

## 2015-11-15 ENCOUNTER — Ambulatory Visit: Payer: 59 | Admitting: Physical Therapy

## 2015-11-15 DIAGNOSIS — R29898 Other symptoms and signs involving the musculoskeletal system: Secondary | ICD-10-CM

## 2015-11-15 DIAGNOSIS — M5440 Lumbago with sciatica, unspecified side: Secondary | ICD-10-CM

## 2015-11-15 DIAGNOSIS — R269 Unspecified abnormalities of gait and mobility: Secondary | ICD-10-CM

## 2015-11-15 NOTE — Patient Instructions (Signed)
Wall Lean Stretch   With right  hand against wall, slowly stretch hips toward wall, other arm supporting trunk. Hold _15 -30 ___ seconds. Relax. Repeat _3___ times per set. Do __1__ sets per session. Do _2-3___ sessions per day.     Elbow Prop (Extension)   Prop body up on elbows for ____ seconds. Slowly lower it as shown in clinic Repeat __10__ times. Do _2-3___ sessions per day to relieve symptoms  Extension   Lie face down, hands close to chest. Press trunk up, arching back. Keep neck long, shoulders down. Tighten buttocks to protect lower back. Hold _3___ seconds. Repeat _5___ times. To relieve symptoms. Do _2___ sessions per day.  Backward Bend (Standing)   Arch backward to make hollow of back deeper. Hold __3__ seconds. Repeat __3__ times per set. Do _1___ sets per session.  Every hour you are up ,stand and extend back  Copyright  VHI. All rights reserved.   Pelvic Tilt against wall     With back against the wall. Perform pelvic tilt 5 times, hold for 10 seconds 3 times a day.    Copyright  VHI. All rights reserved.    Voncille Lo, PT 11/15/2015 8:24 AM Phone: [phone] Fax: [phone]

## 2015-11-15 NOTE — Therapy (Signed)
[place], [place], [address] Phone: [phone]   Fax:  [phone]  Physical Therapy Treatment  Patient Details  Name: [name] MRN: [id number] Date of Birth: 1966-06-26 Referring Provider: Dr. Leta Baptist  Encounter Date: 11/15/2015      PT End of Session - 11/15/15 0757    Visit Number 2   Number of Visits 12   Date for PT Re-Evaluation 12/21/15   Authorization Type AEtna   PT Start Time 0757   PT Stop Time 0850   PT Time Calculation (min) 53 min   Activity Tolerance Patient tolerated treatment well   Behavior During Therapy [facility] for tasks assessed/performed      Past Medical History  Diagnosis Date  . Vitamin D deficiency   . Lumbago with sciatica     left side    Past Surgical History  Procedure Laterality Date  . Back surgery      yrs ago    There were no vitals filed for this visit.  Visit Diagnosis:  Bilateral low back pain with sciatica, sciatica laterality unspecified, unspecified chronicity  Weakness of both lower extremities  Abnormality of gait      Subjective Assessment - 11/15/15 0758    Subjective I havent slept well in last couple of days. Pt point to upper back for pain in shoulder blades more on right than left. I have not been able to work for 1 month .  I am using a  heating pad.  Left leg keeps giving out on me so I am using a cane now and  my Right leg is numb   Pertinent History chronic LBP   Limitations Sitting;Standing;Walking;House hold activities   Patient Stated Goals remind pt how to use body to ease pain; inquiring about back brace as this has worked in the past; improve pain, return to gym   Currently in Pain? Yes   Pain Score 7    Pain Location Back   Pain Orientation Left;Right;Mid;Lower   Pain Descriptors / Indicators Numbness   Pain Type Chronic pain   Pain Radiating Towards c/0 RLE numbness and LLE pain   Pain Onset More than a month ago    Pain Score 6   Pain Location Thoracic  T-4 to T 6   Pain Orientation Mid   Pain Descriptors / Indicators Aching   Pain Type Acute pain   Pain Onset 1 to 4 weeks ago                         [facility] Adult PT Treatment/Exercise - 11/15/15 0807    Self-Care   Self-Care Posture   Posture educated on  sitting and standing posture   Lumbar Exercises: Standing   Other Standing Lumbar Exercises pelvic tilt next to wall x 10 , wall lean stretch with right hadn against wall and lateral lean toward wall.     Other Standing Lumbar Exercises McKenzie extension x 5 and then left rotation with extension x 5   decreased radiating pain in left leg   Lumbar Exercises: Prone   Other Prone Lumbar Exercises Prone press ups x 5  helps relieve symptoms   Other Prone Lumbar Exercises Prone elbow prop with scap protraction and retraction x 10    Traction   Type of Traction Lumbar   Min (lbs) 50   Max (lbs) 100  Pt tolerated 10 minutes and then was taken off of traction  Hold Time 60   Rest Time 10   Time 15   Manual Therapy   Manual Therapy Joint mobilization   Joint Mobilization Thoracic PA in sitting and prone T-3 to T-7 PA grade 3   pain symptoms decreased post manual                PT Education - 11/15/15 0830    Education provided Yes   Education Details explanation of mckenzie extension exericises and given handout, explanation of traction   Person(s) Educated Patient   Methods Explanation;Demonstration;Tactile cues;Verbal cues;Handout   Comprehension Verbalized understanding;Returned demonstration             PT Long Term Goals - 11/15/15 1043    PT LONG TERM GOAL #1   Title independent with HEP (12/21/15)   Time 6   Period Weeks   Status On-going   PT LONG TERM GOAL #2   Title verbalize understanding of posture/body mechanics to decrease risk of reinjury (12/21/15)   Time 6   Period Weeks   Status On-going   PT LONG TERM GOAL #3   Title report  ability to walk > 1/2 mile (~30 min) without increase in pain for improved function (12/21/15)   Time 6   Period Weeks   Status On-going   PT LONG TERM GOAL #4   Title improve lumbar ROM by 10 degrees forward flexion for improved mobility (12/21/15)   Time 6   Period Weeks   Status On-going               Plan - 11/15/15 0844    Clinical Impression Statement Pt enters clinic with straight point cane and radiating pain in left LE.  Pt is educated on traction and proceeds to try traction after exericises for [name] extensiotn.  Pt with pain in R thoracic T-3 to T-5 region.  which is reduced with manual PA mobs in sitting and prone.  Mckenzie extension excericises reduces radiation of pain and [name] proceeds with traction.  Pt  on max 100 pounds. ( pt weight 200lb). Pt tolerated 10 minutes and becomes anxious and wants to stop traction. Pt was with PT monitoring the entire time.  Pt  may try traction next time with reduced weight  but may not tolerate traction due to anxiety.  Pt responded well to McKenzie exericise and may concentrate on  that for next session. Pt  completed MRI and results should be available next session.   Pt will benefit from skilled therapeutic intervention in order to improve on the following deficits Abnormal gait;Decreased strength;Decreased mobility;Postural dysfunction;Pain;Decreased range of motion   Rehab Potential Fair   PT Frequency 2x / week   PT Duration 6 weeks   PT Treatment/Interventions ADLs/Self Care Home Management;Electrical Stimulation;Cryotherapy;Moist Heat;Therapeutic exercise;Therapeutic activities;Functional mobility training;Gait training;Ultrasound;Traction;Neuromuscular re-education;Balance training;Patient/family education;Manual techniques;Dry needling;Passive range of motion   PT Next Visit Plan modalities PRN, HEP for strengthening/stretching,  assess need/benefit of traction use weight lower than 100#   PT Home Exercise Plan  Mckenzie HEP given extension   Consulted and Agree with Plan of Care Patient        Problem List There are no active problems to display for this patient.   [name], PT 11/15/2015 10:50 AM Phone: [phone] Fax: [facility] [address] [place], [place], [address] Phone: [phone]   Fax:  [phone]  Name: [name] MRN: [id number] Date of Birth: 04/30/1966

## 2015-11-16 ENCOUNTER — Ambulatory Visit: Payer: 59 | Admitting: Physical Therapy

## 2015-11-16 DIAGNOSIS — R29898 Other symptoms and signs involving the musculoskeletal system: Secondary | ICD-10-CM

## 2015-11-16 DIAGNOSIS — R269 Unspecified abnormalities of gait and mobility: Secondary | ICD-10-CM

## 2015-11-16 DIAGNOSIS — M5440 Lumbago with sciatica, unspecified side: Secondary | ICD-10-CM | POA: Diagnosis not present

## 2015-11-16 NOTE — Therapy (Signed)
[place], [place], [address] Phone: [phone]   Fax:  [phone]  Physical Therapy Treatment  Patient Details  Name: [name] MRN: [id number] Date of Birth: [birth year] Referring Provider: Dr. Leta Baptist  Encounter Date: 11/16/2015      PT End of Session - 11/16/15 1038    Visit Number 3   Number of Visits 12   Date for PT Re-Evaluation 12/21/15   Authorization Type AEtna   PT Start Time 1015   PT Stop Time 1050   PT Time Calculation (min) 35 min      Past Medical History  Diagnosis Date  . Vitamin D deficiency   . Lumbago with sciatica     left side    Past Surgical History  Procedure Laterality Date  . Back surgery      yrs ago    There were no vitals filed for this visit.  Visit Diagnosis:  Bilateral low back pain with sciatica, sciatica laterality unspecified, unspecified chronicity  Weakness of both lower extremities  Abnormality of gait      Subjective Assessment - 11/16/15 1036    Subjective The machine yesterday felt like it was breaking me.    Currently in Pain? Yes   Pain Score 8    Pain Location Back   Pain Orientation Lower;Right;Left   Pain Radiating Towards Left leg worse than right                         [facility] Adult PT Treatment/Exercise - 11/16/15 0001    Self-Care   Self-Care Other Self-Care Comments   Other Self-Care Comments  Benefits of swimming for LBP, use of TENS, heat, cold for pain relief   Lumbar Exercises: Stretches   Prone on Elbows Stretch Limitations Prone lying , unable to move to POE   Lumbar Exercises: Standing   Other Standing Lumbar Exercises McKenzie extension x 5 and then left rotation with extension x 5   decreased radiating pain in left leg   Modalities   Modalities Moist Heat;Electrical Stimulation   Moist Heat Therapy   Number Minutes Moist Heat 15 Minutes   Moist Heat Location Lumbar Spine   Electrical  Stimulation   Electrical Stimulation Location Lumbar   Electrical Stimulation Action IFC x 15   Electrical Stimulation Parameters 12 mA   Electrical Stimulation Goals Pain                PT Education - 11/15/15 0830    Education provided Yes   Education Details explanation of mckenzie extension exericises and given handout, explanation of traction   Person(s) Educated Patient   Methods Explanation;Demonstration;Tactile cues;Verbal cues;Handout   Comprehension Verbalized understanding;Returned demonstration             PT Long Term Goals - 11/15/15 1043    PT LONG TERM GOAL #1   Title independent with HEP (12/21/15)   Time 6   Period Weeks   Status On-going   PT LONG TERM GOAL #2   Title verbalize understanding of posture/body mechanics to decrease risk of reinjury (12/21/15)   Time 6   Period Weeks   Status On-going   PT LONG TERM GOAL #3   Title report ability to walk > 1/2 mile (~30 min) without increase in pain for improved function (12/21/15)   Time 6   Period Weeks   Status On-going   PT LONG TERM GOAL #4  Title improve lumbar ROM by 10 degrees forward flexion for improved mobility (12/21/15)   Time 6   Period Weeks   Status On-going               Plan - 11/16/15 1039    Clinical Impression Statement Pt reports the exercises and traction yesterday did not make him worse, however they did not help. Began with lumbar extensions which pt reports take the pressure off the front of his left leg. Moved to prone position with pt unable to get comfortable. Pt unable to find comfort in any position. Trial of estim and heat to lumbar spine with pt choosing to lie prone without pillows. Pt tolerated postion well and reports pain decrease to 4/10 after treatment. Encourgaed pt to call MD for MRI results.    PT Next Visit Plan modalities PRN, HEP for strengthening/stretching,  assess need/benefit of traction use weight lower than 100#- pt did not like traction,  continue IFC   PT Home Exercise Plan Mckenzie HEP given extension        Problem List There are no active problems to display for this patient.   Dorene Ar, [place] 11/16/2015, 10:59 AM  [place], [place], [address] Phone: [phone]   Fax:  [phone]  Name: MANAV SIGEL MRN: [id number] Date of Birth: [birth year]

## 2015-11-28 ENCOUNTER — Ambulatory Visit: Payer: 59

## 2015-11-28 ENCOUNTER — Ambulatory Visit: Payer: 59 | Admitting: Physical Therapy

## 2015-11-28 DIAGNOSIS — R269 Unspecified abnormalities of gait and mobility: Secondary | ICD-10-CM

## 2015-11-28 DIAGNOSIS — M5440 Lumbago with sciatica, unspecified side: Secondary | ICD-10-CM

## 2015-11-28 DIAGNOSIS — R29898 Other symptoms and signs involving the musculoskeletal system: Secondary | ICD-10-CM

## 2015-11-28 NOTE — Therapy (Signed)
[place], [place], [address] Phone: [phone]   Fax:  [phone]  Physical Therapy Treatment  Patient Details  Name: [name] MRN: [id number] Date of Birth: 22-Sep-1965 Referring Provider: Dr. Leta Baptist  Encounter Date: 11/28/2015      PT End of Session - 11/28/15 1240    Visit Number 4   Number of Visits 12   Date for PT Re-Evaluation 12/21/15   Authorization Type AEtna   PT Start Time 1140   PT Stop Time 1225   PT Time Calculation (min) 45 min   Activity Tolerance Patient tolerated treatment well   Behavior During Therapy [facility] for tasks assessed/performed      Past Medical History  Diagnosis Date  . Vitamin D deficiency   . Lumbago with sciatica     left side    Past Surgical History  Procedure Laterality Date  . Back surgery      yrs ago    There were no vitals filed for this visit.  Visit Diagnosis:  Bilateral low back pain with sciatica, sciatica laterality unspecified, unspecified chronicity  Weakness of both lower extremities  Abnormality of gait      Subjective Assessment - 11/28/15 1230    Subjective Pt has follow-up with MD on Weds regarding MRI results. Pain was 4/10 upon arrival. Plans to ask MD for different pain meds.    Currently in Pain? Yes   Pain Score 4   7/10 at worst   Pain Location Back   Pain Orientation Right;Left;Lower   Pain Descriptors / Indicators Tingling;Spasm;Sore;Aching   Pain Type Chronic pain   Pain Onset More than a month ago   Pain Frequency Intermittent                         [facility] Adult PT Treatment/Exercise - 11/28/15 0001    Self-Care   Other Self-Care Comments  EDUcation of pathophys of discal herniation, posture, positions to avoid, sleepin gon stomach, sittin gin hard chair, not inm soft chair or recliner, positioning for car, HEP propping on elbows hourly.    Lumbar Exercises: Stretches   Prone on Elbows  Stretch 5 reps;60 seconds   Prone on Elbows Stretch Limitations alternating with lying prone   x 10 mins    Lumbar Exercises: Prone   Other Prone Lumbar Exercises Prone press ups x 5  increased pain centrally, unable to perform with good tech.    Modalities   Modalities Moist Heat;Electrical Stimulation   Moist Heat Therapy   Number Minutes Moist Heat 20 Minutes   Moist Heat Location Lumbar Spine   Manual Therapy   Manual Therapy Soft tissue mobilization   Soft tissue mobilization STM to lumbar paraspinals, QL, glutes,                 PT Education - 11/28/15 1238    Education provided Yes   Education Details Education of pathophys of discal herniation, posture, positions to avoid, sleeping on stomach, sitting in hard chair, not in soft chair or recliner, positioning for car, HEP propping on elbows hourly.    Person(s) Educated Patient   Methods Explanation;Demonstration   Comprehension Verbalized understanding             PT Long Term Goals - 11/15/15 1043    PT LONG TERM GOAL #1   Title independent with HEP (12/21/15)   Time 6   Period Weeks   Status  On-going   PT LONG TERM GOAL #2   Title verbalize understanding of posture/body mechanics to decrease risk of reinjury (12/21/15)   Time 6   Period Weeks   Status On-going   PT LONG TERM GOAL #3   Title report ability to walk > 1/2 mile (~30 min) without increase in pain for improved function (12/21/15)   Time 6   Period Weeks   Status On-going   PT LONG TERM GOAL #4   Title improve lumbar ROM by 10 degrees forward flexion for improved mobility (12/21/15)   Time 6   Period Weeks   Status On-going               Plan - 11/28/15 1240    Clinical Impression Statement Good relief from IFC at last visit. Pt was appreciative of extensive education about positions to avoid and pathophys of discal herniation with use of spinal model. Pt had decreased pain with prone prop and good relief following STW.    PT  Next Visit Plan modalities PRN, MT: STW- trial of manual traction? HEP for strengthening/stretching,  pt did not like mechanical traction, continue IFC   PT Home Exercise Plan Mckenzie HEP given extension: prone prop hourly.    Consulted and Agree with Plan of Care Patient        Problem List There are no active problems to display for this patient.   Dollene Cleveland, PT 11/28/2015, 12:47 PM  [facility] [address] [place], [place], [address] Phone: [phone]   Fax:  [phone]  Name: [name] MRN: [id number] Date of Birth: 1966-05-19

## 2015-11-28 NOTE — Patient Instructions (Signed)

## 2015-11-30 ENCOUNTER — Ambulatory Visit: Payer: 59 | Admitting: Diagnostic Neuroimaging

## 2015-11-30 ENCOUNTER — Telehealth: Payer: Self-pay | Admitting: Diagnostic Neuroimaging

## 2015-11-30 NOTE — Telephone Encounter (Signed)
Returned patient's call and advised he contact PCP with this new problem. He stated he has appt with PCP tomorrow. He inquired about his MRI brain, if results indicate reason for dizziness. Informed him MRI brain results are normal. Re: balance issues, patient has completed three PT sessions; advised he needs to continue with PT which will further increase his strength and help his balance. He is rescheduled to see Dr Leta Baptist on 12/06/15; advised he can discuss MRI results and balance with dr then. He verbalized understanding, appreciation for call back.

## 2015-11-30 NOTE — Telephone Encounter (Signed)
Called pt to r/s his apt frm 11/30/15 pt r/s to 12/06/15 but he is feeling dizzy and off balance and was planning discussing this at his apt today. Pt needs for the nurse to call him.

## 2015-12-06 ENCOUNTER — Encounter: Payer: Self-pay | Admitting: Diagnostic Neuroimaging

## 2015-12-06 ENCOUNTER — Ambulatory Visit (INDEPENDENT_AMBULATORY_CARE_PROVIDER_SITE_OTHER): Payer: 59 | Admitting: Diagnostic Neuroimaging

## 2015-12-06 VITALS — BP 143/83 | HR 79 | Ht 65.5 in | Wt 200.8 lb

## 2015-12-06 DIAGNOSIS — M5441 Lumbago with sciatica, right side: Secondary | ICD-10-CM

## 2015-12-06 DIAGNOSIS — M5442 Lumbago with sciatica, left side: Secondary | ICD-10-CM

## 2015-12-06 NOTE — Progress Notes (Signed)
[facility]  PATIENT: [name] DOB: 05/20/1966  REFERRING CLINICIAN: Othelia Pulling HISTORY FROM: patient  REASON FOR VISIT: follow up     HISTORICAL  CHIEF COMPLAINT:  Chief Complaint  Patient presents with  . Pain    rm 7, low back pain/sciatica, "discuss MRI results; Ibuprofen helps with back pain""  . Follow-up    1 month    HISTORY OF PRESENT ILLNESS:   UPDATE 12/06/15: Since last visit, doing slightly better. Still with back pain issues. Patient struggling with sleep and pain issues. Gabapentin helped a little.   PRIOR HPI (11/02/15): 50 year old right-handed male here for evaluation of low back pain. Symptoms of been going on since 2011 or 2012. He would have intermittent flareups of symptoms once or twice a month which would be self-limited and resolve. Sometimes he would have pain rating his left leg, sometimes right leg. Sometimes he would have weakness in his legs lasting 1-2 months at time and then resolve. Over time he has noticed some intermittent symptoms of blurred vision, especially in the right eye. Sometimes he sees double vision. Sometimes he has to look at the TV using his left eye with his head tilted to the right. Patient has also noticed decreased sensation and numbness in his right arm and right leg. Patient was evaluated by neurology in [place] a few years ago, recommended to have MRI testing and further workup but patient never followed up as patient moved to [place]. Symptoms are worse if he is sitting for a long time and then he has to stand up and walk around. Patient having significant pain at this time. Patient was treated with intramuscular injection of steroid and pain reliever. Patient reports remote history of low back disc herniation he thinks L5 level, with pain rating left leg, status post discectomy in 1980's, with good results.   REVIEW OF SYSTEMS: Full 14 system review of systems performed and notable only for  numbness weakness joint pain aching muscles.  ALLERGIES: No Known Allergies  HOME MEDICATIONS: Outpatient Prescriptions Prior to Visit  Medication Sig Dispense Refill  . ALPRAZolam (XANAX) 0.5 MG tablet Take 1 tablet (0.5 mg total) by mouth as needed for anxiety (for sedation before MRI scan; take 1 hour before scan; may repeat 15 min before scan). 3 tablet 0  . Cholecalciferol (VITAMIN D PO) Take 1 capsule by mouth daily. Reported on 11/02/2015    . diclofenac sodium (VOLTAREN) 1 % GEL Apply topically 4 (four) times daily. Reported on 11/09/2015    . ibuprofen (ADVIL,MOTRIN) 800 MG tablet Take 1 tablet (800 mg total) by mouth every 8 (eight) hours as needed. 21 tablet 0  . HYDROcodone-acetaminophen (NORCO/VICODIN) 5-325 MG per tablet Take 1 tablet by mouth every 6 (six) hours as needed for moderate pain. 15 tablet 0  . gabapentin (NEURONTIN) 300 MG capsule Take 1 capsule (300 mg total) by mouth 3 (three) times daily. (Patient not taking: Reported on 12/06/2015) 90 capsule 11  . traMADol (ULTRAM) 50 MG tablet Take by mouth every 6 (six) hours as needed. Reported on 12/06/2015     No facility-administered medications prior to visit.    PAST MEDICAL HISTORY: Past Medical History  Diagnosis Date  . Vitamin D deficiency   . Lumbago with sciatica     left side    PAST SURGICAL HISTORY: Past Surgical History  Procedure Laterality Date  . Back surgery      yrs ago    FAMILY HISTORY: Family  History  Problem Relation Age of Onset  . Hypothyroidism Mother   . Hypertension Father     SOCIAL HISTORY:  Social History   Social History  . Marital Status: Married    Spouse Name: [name]  . Number of Children: 4  . Years of Education: 12   Occupational History  .      unemployed   Social History Main Topics  . Smoking status: Current Every Day Smoker    Types: Cigarettes  . Smokeless tobacco: Not on file     Comment: 11/02/15 3-4 cigs/day, trying to quit  . Alcohol Use: No  .  Drug Use: No  . Sexual Activity: Not on file   Other Topics Concern  . Not on file   Social History Narrative   Lives at home wife, children   Caffeine use- coffee 2-3 cups      PHYSICAL EXAM  GENERAL EXAM/CONSTITUTIONAL: Vitals:  Filed Vitals:   12/06/15 1450  BP: 143/83  Pulse: 79  Height: 5' 5.5" (1.664 m)  Weight: 200 lb 12.8 oz (91.082 kg)   Body mass index is 32.89 kg/(m^2). No exam data present  Patient is in no distress; well developed, nourished and groomed  CARDIOVASCULAR:  Examination of carotid arteries is normal; no carotid bruits  Regular rate and rhythm, no murmurs  Examination of peripheral vascular system by observation and palpation is normal  EYES:  Ophthalmoscopic exam of optic discs and posterior segments is normal; no papilledema or hemorrhages  MUSCULOSKELETAL:  Gait, strength, tone, movements noted in Neurologic exam below  NEUROLOGIC: MENTAL STATUS:  No flowsheet data found.  awake, alert, oriented to person, place and time  recent and remote memory intact  normal attention and concentration  language fluent, comprehension intact, naming intact,   fund of knowledge appropriate  CRANIAL NERVE:   2nd - no papilledema on fundoscopic exam  2nd, 3rd, 4th, 6th - pupils equal and reactive to light, visual fields full to confrontation, extraocular muscles --> HORIZONTAL BINOCULAR DOUBLE VISION IN PRIMARY GAZE; RIGHT AND LEFT LATERAL GAZE NORMAL; no nystagmus; LIGHT SENSITIVITY IN RIGHT EYE; BLURRED VISION IN RIGHT EYE  5th - facial sensation symmetric  7th - facial strength symmetric  8th - hearing intact  9th - palate elevates symmetrically, uvula midline  11th - shoulder shrug symmetric  12th - tongue protrusion midline  MOTOR:   normal bulk; DECR TONE IN BLE; full strength in the BUE EXCEPT DECR RIGHT HAND FINGER ABDUCTION; BLE (HIP FLEX 3; KNEE EXT 3-4; KNEE FLEX 3-4; RIGHT FOOT DORSIFLEX 2; LEFT FOOT DORSIFLEX 3;  RIGHT TOE EXT 0; LEFT TOE EXT 2)  SENSORY:   DECR IN RUE COMPARED TO LUE; SIG DECR IN BLE (RIGHT FOOT WORSE THAN LEFT) TO ALL MODALITIES  COORDINATION:   finger-nose-finger, fine finger movements normal  REFLEXES:   deep tendon reflexes --> BUE 1; BLE 0; DOWN GOING TOES  GAIT/STATION:   ANTALGIC GAIT; CANNOT WALK ON TOES OR HEELS; romberg is negative    DIAGNOSTIC DATA (LABS, IMAGING, TESTING) - I reviewed patient records, labs, notes, testing and imaging myself where available.  Lab Results  Component Value Date   WBC 5.8 11/02/2015   HCT 44.1 11/02/2015   MCV 85 11/02/2015   PLT 243 11/02/2015      Component Value Date/Time   NA 138 11/02/2015 0931   K 4.6 11/02/2015 0931   CL 99 11/02/2015 0931   CO2 25 11/02/2015 0931   GLUCOSE 99 11/02/2015 0931  BUN 13 11/02/2015 0931   CREATININE 0.84 11/02/2015 0931   CALCIUM 9.3 11/02/2015 0931   PROT 7.1 11/02/2015 0931   ALBUMIN 4.5 11/02/2015 0931   AST 17 11/02/2015 0931   ALT 26 11/02/2015 0931   ALKPHOS 78 11/02/2015 0931   BILITOT 0.6 11/02/2015 0931   GFRNONAA 104 11/02/2015 0931   GFRAA 120 11/02/2015 0931   No results found for: CHOL, HDL, LDLCALC, LDLDIRECT, TRIG, CHOLHDL Lab Results  Component Value Date   HGBA1C 5.9* 11/02/2015   Lab Results  Component Value Date   VITAMINB12 206* 11/02/2015   Lab Results  Component Value Date   TSH 1.500 11/02/2015     11/04/13 xray lumbar [I reviewed images myself and agree with interpretation. -VRP]  1. Lower lumbar spondylosis and degenerative disc disease. Stable mild retrolisthesis at L3-4 and L4-5. No acute bony findings.  11/04/13 xray thoracic [I reviewed images myself and agree with interpretation. -VRP]  1. No acute thoracic spine findings. Thoracic spondylosis and degenerative disc disease noted. 2. Probably incidental exaggerated curvature of the right clavicle. If the patient has pain over the right clavicle then dedicated shoulder or clavicular  imaging would be recommended.   11/14/15 MRI lumbar spine 1. At L3-4: disc bulging and facet hypertrophy with mild spinal stenosis and mild biforaminal stenosis 2. At L4-5: disc bulging and facet hypertrophy, right laminectomy, with mild biforaminal stenosis 3. At L5-S1: disc bulging and facet hypertrophy with mild right and moderate left foraminal stenosis  11/14/15 MRI brain - normal     ASSESSMENT AND PLAN  50 y.o. year old male here with history of lumbar spine disc herniation status post surgery in the 1980s, now with increasing pain in the low back radiating to left leg since 2012. Patient also reports right eye blurred vision, binocular double vision, right arm and leg numbness, and intermittent lower extremity weakness episodes lasting 1-2 months at a time. MRI brain normal.    Dx: lumbar radiculopathy / spinal stenosis  1. Bilateral low back pain with sciatica, sciatica laterality unspecified      PLAN: I spent 25 minutes of face to face time with patient. Greater than 50% of time was spent in counseling and coordination of care with patient. In summary we discussed:  - continue physical therapy  - consider referral to pain mgmt clinic in 1-2 month if not improving - start B12 replacement orally  Return in about 6 months (around 06/07/2016).     Penni Bombard, MD [id number], 123456 PM Certified in Neurology, Neurophysiology and Neuroimaging  [facility] [address], [place], [place] [address] [phone]

## 2015-12-06 NOTE — Patient Instructions (Signed)
Thank you for coming to see [place] at [facility]. I hope we have been able to provide you high quality care today.  You may receive a patient satisfaction survey over the next few weeks. We would appreciate your feedback and comments so that we may continue to improve ourselves and the health of our patients.  - start vitamin B12 supplement (1000 mcg per day) - continue physical therapy exercises and training   ~~~~~~~~~~~~~~~~~~~~~~~~~~~~~~~~~~~~~~~~~~~~~~~~~~~~~~~~~~~~~~~~~  DR. PENUMALLI'S GUIDE TO HAPPY AND HEALTHY LIVING These are some of my general health and wellness recommendations. Some of them may apply to you better than others. Please use common sense as you try these suggestions and feel free to ask me any questions.   ACTIVITY/FITNESS Mental, social, emotional and physical stimulation are very important for brain and body health. Try learning a new activity (arts, music, language, sports, games).  Keep moving your body to the best of your abilities. You can do this at home, inside or outside, the park, community center, gym or anywhere you like. Consider a physical therapist or personal trainer to get started. Consider the app Sworkit. Fitness trackers such as smart-watches, smart-phones or Fitbits can help as well.   NUTRITION Eat more plants: colorful vegetables, nuts, seeds and berries.  Eat less sugar, salt, preservatives and processed foods.  Avoid toxins such as cigarettes and alcohol.  Drink water when you are thirsty. Warm water with a slice of lemon is an excellent morning drink to start the day.  Consider these websites for more information The Nutrition Source (https://www.henry-hernandez.biz/) Precision Nutrition (WindowBlog.ch)   RELAXATION Consider practicing mindfulness meditation or other relaxation techniques such as deep breathing, prayer, yoga, tai chi, massage. See website mindful.org or  the apps Headspace or Calm to help get started.   SLEEP Try to get at least 7-8+ hours sleep per day. Regular exercise and reduced caffeine will help you sleep better. Practice good sleep hygeine techniques. See website sleep.org for more information.   PLANNING Prepare estate planning, living will, healthcare POA documents. Sometimes this is best planned with the help of an attorney. Theconversationproject.org and agingwithdignity.org are excellent resources.

## 2015-12-08 ENCOUNTER — Ambulatory Visit: Payer: 59 | Admitting: Physical Therapy

## 2015-12-15 ENCOUNTER — Ambulatory Visit: Payer: 59 | Attending: Diagnostic Neuroimaging | Admitting: Physical Therapy

## 2015-12-15 DIAGNOSIS — M545 Low back pain: Secondary | ICD-10-CM | POA: Insufficient documentation

## 2015-12-15 DIAGNOSIS — M6281 Muscle weakness (generalized): Secondary | ICD-10-CM | POA: Insufficient documentation

## 2015-12-15 DIAGNOSIS — R262 Difficulty in walking, not elsewhere classified: Secondary | ICD-10-CM

## 2015-12-15 NOTE — Therapy (Signed)
[place], [place], [address] Phone: [phone]   Fax:  [phone]  Physical Therapy Treatment  Patient Details  Name: [name] MRN: [id number] Date of Birth: 04-18-1966 Referring Provider: Dr. Leta Baptist  Encounter Date: 12/15/2015      PT End of Session - 12/15/15 1712    Visit Number 5   Number of Visits 12   Date for PT Re-Evaluation 12/21/15   Authorization Type AEtna   PT Start Time 0438   PT Stop Time 0535   PT Time Calculation (min) 57 min      Past Medical History  Diagnosis Date  . Vitamin D deficiency   . Lumbago with sciatica     left side    Past Surgical History  Procedure Laterality Date  . Back surgery      yrs ago    There were no vitals filed for this visit.  Visit Diagnosis:  Muscle weakness (generalized)  Difficulty in walking, not elsewhere classified      Subjective Assessment - 12/15/15 1641    Subjective rated pain 9/10 in a.m., 4 after heat. has difficulty sitting up, needs to push up on hands in order to keep his back straight.   Currently in Pain? Yes   Pain Score 4    Pain Location Leg                         [facility] Adult PT Treatment/Exercise - 12/15/15 0001    Lumbar Exercises: Stretches   Prone on Elbows Stretch 5 reps;60 seconds   Prone on Elbows Stretch Limitations alternating with lying prone   x 10 mins    Lumbar Exercises: Prone   Single Arm Raise 10 reps   Straight Leg Raise 10 reps   Opposite Arm/Leg Raise 10 reps   Other Prone Lumbar Exercises Prone press ups x 5  increased pain centrally, unable to perform with good tech.    Moist Heat Therapy   Number Minutes Moist Heat 15 Minutes   Moist Heat Location Lumbar Spine   Electrical Stimulation   Electrical Stimulation Location lumbar   Electrical Stimulation Action IFC x15   Electrical Stimulation Parameters to tolerance   Electrical Stimulation Goals Pain   Manual Therapy   Manual Therapy Soft tissue mobilization   Soft tissue mobilization STM to lumbar paraspinals, QL, glutes,   prone and sidelying                     PT Long Term Goals - 11/15/15 1043    PT LONG TERM GOAL #1   Title independent with HEP (12/21/15)   Time 6   Period Weeks   Status On-going   PT LONG TERM GOAL #2   Title verbalize understanding of posture/body mechanics to decrease risk of reinjury (12/21/15)   Time 6   Period Weeks   Status On-going   PT LONG TERM GOAL #3   Title report ability to walk > 1/2 mile (~30 min) without increase in pain for improved function (12/21/15)   Time 6   Period Weeks   Status On-going   PT LONG TERM GOAL #4   Title improve lumbar ROM by 10 degrees forward flexion for improved mobility (12/21/15)   Time 6   Period Weeks   Status On-going               Plan - 12/15/15 1721  Clinical Impression Statement Review of HEP, with cues to perform prone press ups and hamstring stretch correctly. Instructed pt in prone core/hip strengthening without increased pain. Soft tissue work to left lower back with pt  being very sensitive to pressure and one episode of increased leg pain. Pt prefers sidelying. Pt reports IFC very helpful, makes him feel like he can walk without his cane. Will fax order to MD for TENS. May issue TENS at next appointment if order signed back. Pt reports Prone on elbows and standing lumbar extension help decrease his leg pain and he does those frequently.    Pt will benefit from skilled therapeutic intervention in order to improve on the following deficits --  difficulty in walking, muscle weakness   PT Next Visit Plan gentle soft tisue work in side lying as tolerated, check for TENS order, issue TENS? Try prone or quadruped core strengthening and add to HEP.         Problem List There are no active problems to display for this patient.   Dorene Ar, [place] 12/15/2015, 5:29 PM  [place], [place], [address] Phone: [phone]   Fax:  [phone]  Name: BRADLEE LEBER MRN: [id number] Date of Birth: 1966-04-13

## 2015-12-29 ENCOUNTER — Ambulatory Visit: Payer: 59 | Admitting: Physical Therapy

## 2015-12-29 ENCOUNTER — Encounter: Payer: Self-pay | Admitting: Physical Therapy

## 2015-12-29 DIAGNOSIS — M6281 Muscle weakness (generalized): Secondary | ICD-10-CM | POA: Diagnosis not present

## 2015-12-29 DIAGNOSIS — R262 Difficulty in walking, not elsewhere classified: Secondary | ICD-10-CM

## 2015-12-29 DIAGNOSIS — M545 Low back pain: Secondary | ICD-10-CM

## 2015-12-29 NOTE — Therapy (Signed)
[facility] [address] [place], [place], [address] Phone: [phone]   Fax:  [phone]  Physical Therapy Treatment  Patient Details  Name: [name] MRN: [id number] Date of Birth: 07/01/1966 Referring Provider: Dr. Penumalli  Encounter Date: 12/29/2015      PT End of Session - 12/29/15 1129    Visit Number 6   Number of Visits 12   Date for PT Re-Evaluation 01/19/16   Authorization Type Aetna   PT Start Time 1024   PT Stop Time 1121   PT Time Calculation (min) 57 min   Activity Tolerance Patient tolerated treatment well   Behavior During Therapy WFL for tasks assessed/performed      Past Medical History  Diagnosis Date  . Vitamin D deficiency   . Lumbago with sciatica     left side    Past Surgical History  Procedure Laterality Date  . Back surgery      yrs ago    There were no vitals filed for this visit.      Subjective Assessment - 12/29/15 1025    Subjective May have overdone it, last 3 days increased pain in middle low back. Was feeling like he was making progress but has increased lately.    Limitations Sitting;Standing;Walking   How long can you sit comfortably? "less than I can stand"   How long can you stand comfortably? "hardly any time"   Diagnostic tests See MRI report   Currently in Pain? Yes   Pain Score 9    Pain Location Back   Pain Orientation Right;Left   Pain Descriptors / Indicators Aching;Sharp;Constant   Pain Type Chronic pain   Pain Radiating Towards Bilateral LE, L worse than R   Pain Frequency Constant   Pain Relieving Factors laying prone, heat pad   Multiple Pain Sites Yes                         [facility] Adult PT Treatment/Exercise - 12/29/15 0001    Ambulation/Gait   Gait Pattern Trendelenburg   Gait Comments mirror for visual cues   Posture/Postural Control   Posture Comments Discussion and practice of standing posture   Self-Care   Other  Self-Care Comments  posture, avoidance of flexion, sleeping posture, HEP, TENS unit   Lumbar Exercises: Standing   Other Standing Lumbar Exercises standing hip abduction  5x20 pulses each   Other Standing Lumbar Exercises weighted extension hips against bed   Manual Therapy   Joint Mobilization prone with hip ER (switching PRN) thoracic PA and rib ER                PT Education - 12/29/15 1129    Education provided Yes   Education Details anatomy of condition, explanation of MRI report, plan of care, HEP   Person(s) Educated Patient   Methods Explanation;Demonstration;Verbal cues;Tactile cues;Handout   Comprehension Verbalized understanding;Returned demonstration;Verbal cues required;Tactile cues required;Need further instruction             PT Long Term Goals - 12/29/15 1136    PT LONG TERM GOAL #1   Title independent with HEP (12/21/15)   Status Partially Met  is independent as HEP has been developed   PT LONG TERM GOAL #2   Title verbalize understanding of posture/body mechanics to decrease risk of reinjury by 5/11   Time 3   Period Weeks   Status On-going   PT LONG TERM GOAL #3     Title report ability to walk > 1/2 mile (~30 min) without increase in pain for improved function by 5/11   Time 3   Period Weeks   Status On-going   PT LONG TERM GOAL #4   Title improve lumbar ROM by 10 degrees forward flexion for improved mobility by 5/11   Time 3   Period Weeks   Status On-going  not tested 4/20 due to pain               Plan - 12/29/15 1130    Clinical Impression Statement Patient demonstrated significant R trendelenburg which was addressed and provided with home exercise. Pt discomfort laying prone improved with LE ER, alternated today due to lack of tolerance for long term positioning. Patient reported pain down to 3/10 following treatment today. Was issued a home TENS unit today. Centralization occurred with positioning of prone paired with hip ER. Pt  will continue to benefit from skilled PT to continue centralizing pain and providing strength and support to lumbar spine.    Rehab Potential Fair   Clinical Impairments Affecting Rehab Potential chronic pain   PT Frequency 2x / week   PT Duration 3 weeks   PT Treatment/Interventions ADLs/Self Care Home Management;Electrical Stimulation;Cryotherapy;Moist Heat;Therapeutic exercise;Therapeutic activities;Functional mobility training;Gait training;Ultrasound;Traction;Neuromuscular re-education;Balance training;Patient/family education;Manual techniques;Dry needling;Passive range of motion;Taping   PT Next Visit Plan continue mckenzie approach for extension bias, glut med strengthening (esp R), FOTO   PT Home Exercise Plan prone with hip ER 5min ea, standing hip abduction 5x20 pulses each, standing posture against chair or counter      Patient will benefit from skilled therapeutic intervention in order to improve the following deficits and impairments:  Pain, Abnormal gait, Decreased range of motion, Difficulty walking, Decreased activity tolerance, Improper body mechanics, Impaired flexibility, Decreased mobility, Postural dysfunction  Visit Diagnosis: Muscle weakness (generalized) - Plan: PT plan of care cert/re-cert  Difficulty in walking, not elsewhere classified - Plan: PT plan of care cert/re-cert  Bilateral low back pain, with sciatica presence unspecified - Plan: PT plan of care cert/re-cert     Problem List There are no active problems to display for this patient.    C.  PT, DPT 12/29/2015 12:03 PM   [facility] [address] [place], [place], [address] Phone: [phone]   Fax:  [phone]  Name: Evangelos W Hendel MRN: [id number] Date of Birth: 08/29/1966     

## 2016-01-03 ENCOUNTER — Telehealth: Payer: Self-pay | Admitting: Diagnostic Neuroimaging

## 2016-01-03 NOTE — Telephone Encounter (Signed)
Pt called sts he wants MR released to Dr Sherley Bounds (f) [phone]. Please call pt

## 2016-01-04 ENCOUNTER — Telehealth: Payer: Self-pay | Admitting: *Deleted

## 2016-01-04 NOTE — Telephone Encounter (Signed)
Records faxed, to Dr. Sherley Bounds office on 01/04/16.

## 2016-01-04 NOTE — Telephone Encounter (Signed)
Patient records were faxed on 01/04/16 to Dr. Sherley Bounds.

## 2016-01-05 ENCOUNTER — Ambulatory Visit: Payer: 59 | Admitting: Physical Therapy

## 2016-01-10 ENCOUNTER — Ambulatory Visit: Payer: 59 | Admitting: Physical Therapy

## 2016-01-11 ENCOUNTER — Ambulatory Visit: Payer: 59 | Attending: Diagnostic Neuroimaging | Admitting: Physical Therapy

## 2016-01-11 ENCOUNTER — Ambulatory Visit: Payer: 59 | Admitting: Physical Therapy

## 2016-01-11 DIAGNOSIS — R29898 Other symptoms and signs involving the musculoskeletal system: Secondary | ICD-10-CM | POA: Insufficient documentation

## 2016-01-11 DIAGNOSIS — R262 Difficulty in walking, not elsewhere classified: Secondary | ICD-10-CM | POA: Diagnosis present

## 2016-01-11 DIAGNOSIS — M5440 Lumbago with sciatica, unspecified side: Secondary | ICD-10-CM

## 2016-01-11 DIAGNOSIS — M545 Low back pain: Secondary | ICD-10-CM | POA: Insufficient documentation

## 2016-01-11 DIAGNOSIS — M6281 Muscle weakness (generalized): Secondary | ICD-10-CM

## 2016-01-11 NOTE — Therapy (Addendum)
[facility], [place], [address] Phone: [phone]   Fax:  [phone]  Physical Therapy Treatment  Patient Details  Name: [name] MRN: [id number] Date of Birth: [birth year] Referring Provider: Dr. Leta Baptist  Encounter Date: 01/11/2016      PT End of Session - 01/11/16 1758    Visit Number 7   Number of Visits 12   Date for PT Re-Evaluation 01/19/16   PT Start Time 1500   PT Stop Time 1540   PT Time Calculation (min) 40 min   Activity Tolerance Patient tolerated treatment well   Behavior During Therapy Restless;Anxious;Impulsive      Past Medical History  Diagnosis Date  . Vitamin D deficiency   . Lumbago with sciatica     left side    Past Surgical History  Procedure Laterality Date  . Back surgery      yrs ago    There were no vitals filed for this visit.      Subjective Assessment - 01/11/16 1519    Subjective Able to mow the grass today, Ihave a mower that moves itself.     Currently in Pain? No/denies   Pain Score 9    Pain Location Back   Pain Orientation Right;Left   Pain Descriptors / Indicators Aching;Sharp   Pain Type Chronic pain   Pain Radiating Towards Lt reg >RT     Pain Frequency Constant   Aggravating Factors  prone with legs straight, standing or sitting too long.   Pain Relieving Factors Tens,  massaging  pad, biofreeze                         [facility] Adult PT Treatment/Exercise - 01/11/16 0001    Ambulation/Gait   Pre-Gait Activities trendelengurg work eack hip.  weight shifting away from weight bearing side  5X each  cues to keep on task   Lumbar Exercises: Stretches   Standing Extension 10 seconds;5 reps  standing on2 inch platform at counter   Lumbar Exercises: Standing   Wall Slides 10 reps   Other Standing Lumbar Exercises standing hip extension 10 x alternating  Hip abduction 10 X each alternating   Other Standing Lumbar Exercises  weighted extension hips against bed   Lumbar Exercises: Sidelying   Clam Limitations attempted but unable to do.  RT,  tried hip adduction  with 2 pillow squeeze 3 X stopped  due to uncomfortable   Lumbar Exercises: Prone   Single Arm Raise 5 reps  cues to keep on task,  small motions   Single Arm Raises Limitations not comfortable   Straight Leg Raise 5 reps   Straight Leg Raises Limitations not comfortable   Opposite Arm/Leg Raise 5 reps   Opposite Arm/Leg Raise Limitations not comfortable                     PT Long Term Goals - 01/11/16 1803    PT LONG TERM GOAL #1   Title independent with HEP (12/21/15)   Time 6   Period Weeks   Status Partially Met   PT LONG TERM GOAL #2   Title verbalize understanding of posture/body mechanics to decrease risk of reinjury by 5/11   Period Weeks   Status On-going   PT LONG TERM GOAL #3   Title report ability to walk > 1/2 mile (~30 min) without increase in pain for improved function by 5/11  Baseline unable   Time 3   Period Weeks   Status On-going   PT LONG TERM GOAL #4   Title improve lumbar ROM by 10 degrees forward flexion for improved mobility by 5/11   Time 3   Period Weeks   Status Unable to assess               Plan - 01/11/16 1801    Clinical Impression Statement FOTO Limitation 48 % compared to 46% limitation at intake.  Patient needed frequent cues to stay on task.  He has been able to mow his grass today.  pain 4/10 post session.   PT Next Visit Plan continue mckenzie approach for extension bias, glut med strengthening (esp R),    PT Home Exercise Plan continue   Consulted and Agree with Plan of Care Patient      Patient will benefit from skilled therapeutic intervention in order to improve the following deficits and impairments:  Pain, Abnormal gait, Decreased range of motion, Difficulty walking, Decreased activity tolerance, Improper body mechanics, Impaired flexibility, Decreased mobility,  Postural dysfunction  Visit Diagnosis: Muscle weakness (generalized)  Difficulty in walking, not elsewhere classified  Bilateral low back pain, with sciatica presence unspecified  Bilateral low back pain with sciatica, sciatica laterality unspecified, unspecified chronicity  Weakness of both lower extremities     Problem List There are no active problems to display for this patient.   [facility] 01/11/2016, 6:05 PM  [facility] [address] [place], [place], [address] Phone: [phone]   Fax:  [phone]  Name: [name] MRN: [id number] Date of Birth: 1966-07-19    PHYSICAL THERAPY DISCHARGE SUMMARY  Visits from Start of Care: 7  Current functional level related to goals / functional outcomes: See above   Remaining deficits: See above   Education / Equipment: Exercise form/rationale, anatomy of condition, POC, HEP  Plan: Patient agrees to discharge.  Patient goals were not met. Patient is being discharged due to not returning since the last visit.  ?????  Jessica C. Hightower PT, DPT 04/24/16 3:25 PM

## 2016-01-18 ENCOUNTER — Ambulatory Visit: Payer: 59 | Admitting: Physical Therapy

## 2016-11-21 DIAGNOSIS — Z0271 Encounter for disability determination: Secondary | ICD-10-CM

## 2017-01-08 DIAGNOSIS — Z8659 Personal history of other mental and behavioral disorders: Secondary | ICD-10-CM

## 2017-01-08 HISTORY — DX: Personal history of other mental and behavioral disorders: Z86.59

## 2017-01-11 ENCOUNTER — Emergency Department (HOSPITAL_COMMUNITY)
Admission: EM | Admit: 2017-01-11 | Discharge: 2017-01-12 | Disposition: A | Discharge status: Other Acute Inpatient Hospital | Payer: 59 | Attending: Emergency Medicine | Admitting: Emergency Medicine

## 2017-01-11 ENCOUNTER — Emergency Department (HOSPITAL_COMMUNITY): Payer: 59

## 2017-01-11 ENCOUNTER — Encounter (HOSPITAL_COMMUNITY): Payer: Self-pay | Admitting: Emergency Medicine

## 2017-01-11 DIAGNOSIS — R45851 Suicidal ideations: Secondary | ICD-10-CM

## 2017-01-11 DIAGNOSIS — F1410 Cocaine abuse, uncomplicated: Secondary | ICD-10-CM | POA: Insufficient documentation

## 2017-01-11 DIAGNOSIS — R0789 Other chest pain: Secondary | ICD-10-CM | POA: Insufficient documentation

## 2017-01-11 DIAGNOSIS — Z79899 Other long term (current) drug therapy: Secondary | ICD-10-CM | POA: Insufficient documentation

## 2017-01-11 DIAGNOSIS — F17210 Nicotine dependence, cigarettes, uncomplicated: Secondary | ICD-10-CM | POA: Insufficient documentation

## 2017-01-11 LAB — CBC
HCT: 42 % (ref 39.0–52.0)
Hemoglobin: 14 g/dL (ref 13.0–17.0)
MCH: 27.5 pg (ref 26.0–34.0)
MCHC: 33.3 g/dL (ref 30.0–36.0)
MCV: 82.5 fL (ref 78.0–100.0)
Platelets: 226 10*3/uL (ref 150–400)
RBC: 5.09 MIL/uL (ref 4.22–5.81)
RDW: 15.1 % (ref 11.5–15.5)
WBC: 7.4 10*3/uL (ref 4.0–10.5)

## 2017-01-11 LAB — RAPID URINE DRUG SCREEN, HOSP PERFORMED
Amphetamines: NOT DETECTED
BARBITURATES: NOT DETECTED
BENZODIAZEPINES: NOT DETECTED
COCAINE: POSITIVE — AB
Opiates: NOT DETECTED
Tetrahydrocannabinol: POSITIVE — AB

## 2017-01-11 LAB — COMPREHENSIVE METABOLIC PANEL
ALK PHOS: 73 U/L (ref 38–126)
ALT: 22 U/L (ref 17–63)
AST: 23 U/L (ref 15–41)
Albumin: 3.7 g/dL (ref 3.5–5.0)
Anion gap: 8 (ref 5–15)
BILIRUBIN TOTAL: 0.6 mg/dL (ref 0.3–1.2)
BUN: 10 mg/dL (ref 6–20)
CALCIUM: 9 mg/dL (ref 8.9–10.3)
CO2: 25 mmol/L (ref 22–32)
Chloride: 102 mmol/L (ref 101–111)
Creatinine, Ser: 1.04 mg/dL (ref 0.61–1.24)
Glucose, Bld: 105 mg/dL — ABNORMAL HIGH (ref 65–99)
Potassium: 3.2 mmol/L — ABNORMAL LOW (ref 3.5–5.1)
Sodium: 135 mmol/L (ref 135–145)
TOTAL PROTEIN: 7.2 g/dL (ref 6.5–8.1)

## 2017-01-11 LAB — SALICYLATE LEVEL

## 2017-01-11 LAB — ACETAMINOPHEN LEVEL: Acetaminophen (Tylenol), Serum: <10 ug/mL — ABNORMAL LOW (ref 10–30)

## 2017-01-11 LAB — I-STAT TROPONIN, ED: TROPONIN I, POC: 0 ng/mL (ref 0.00–0.08)

## 2017-01-11 LAB — ETHANOL: Alcohol, Ethyl (B): <5 mg/dL (ref <5)

## 2017-01-11 MED ORDER — ACETAMINOPHEN 325 MG PO TABS: 650.0000 mg | ORAL_TABLET | ORAL | Status: DC | PRN

## 2017-01-11 MED ORDER — NICOTINE 21 MG/24HR TD PT24: 21.0000 mg | MEDICATED_PATCH | Freq: Every day | TRANSDERMAL | Status: DC | PRN

## 2017-01-11 MED ORDER — ONDANSETRON HCL 4 MG PO TABS: 4.0000 mg | ORAL_TABLET | Freq: Three times a day (TID) | ORAL | Status: DC | PRN

## 2017-01-11 MED ORDER — ALUM & MAG HYDROXIDE-SIMETH 200-200-20 MG/5ML PO SUSP: 30.0000 mL | ORAL | Status: DC | PRN

## 2017-01-11 MED ORDER — IBUPROFEN 400 MG PO TABS: 600.0000 mg | ORAL_TABLET | Freq: Three times a day (TID) | ORAL | Status: DC | PRN

## 2017-01-11 MED ORDER — ZOLPIDEM TARTRATE 5 MG PO TABS: 5.0000 mg | ORAL_TABLET | Freq: Every evening | ORAL | Status: DC | PRN

## 2017-01-11 NOTE — ED Provider Notes (Addendum)
[facility] DEPT Provider Note   CSN: [id number] Arrival date & time: 01/11/17  2029     History   Chief Complaint Chief Complaint  Patient presents with  . Chest Pain  . Suicidal    HPI [name] is a 51 y.o. male.  He presents under involuntary commitment, for evaluation of suicidal ideation.  During transport he complained of chest pain, so he was diverted from [facility] to [facility] emergency department.  The pain in his chest is left anterior, sharp in nature and worse with palpation and deep breathing.  He feels he might have injured the chest, lifting a lawnmower, 4 days ago.  The lawnmower was heavy and he had to have a friend help him with it.  He denies associated fever, chills, cough, shortness of breath, nausea, vomiting, weakness or dizziness.  The patient's wife took him to a psychiatric outpatient facility today because he had become more aggressive, and had been staying out at night.  He also threatened to "take a half a bottle of pills." His wife assumed that he was feeling suicidal.  Currently, the patient denies suicidal plan.  He is interested in getting some help.  He has chronic back pain from "a ruptured disc".  He has been put on gabapentin, for leg discomfort.  There are no other known modifying factors.  HPI  Past Medical History:  Diagnosis Date  . Lumbago with sciatica    left side  . Vitamin D deficiency     There are no active problems to display for this patient.   Past Surgical History:  Procedure Laterality Date  . BACK SURGERY     yrs ago       Home Medications    Prior to Admission medications   Medication Sig Start Date End Date Taking? Authorizing Provider  Cholecalciferol (VITAMIN D PO) Take 1 capsule by mouth daily. Reported on 11/02/2015   Yes [provider]  diclofenac sodium (VOLTAREN) 1 % GEL Apply topically 4 (four) times daily. Reported on 11/09/2015   Yes [provider]  gabapentin  (NEURONTIN) 400 MG capsule Take 400 mg by mouth 3 (three) times daily.   Yes [provider]  hydrOXYzine (VISTARIL) 25 MG capsule Take 25 mg by mouth 3 (three) times daily as needed for anxiety.   Yes [provider]  ibuprofen (ADVIL,MOTRIN) 800 MG tablet Take 1 tablet (800 mg total) by mouth every 8 (eight) hours as needed. 11/04/13  Yes Lawyer, Harrell Gave, PA-C  vortioxetine HBr (TRINTELLIX) 5 MG TABS Take 5 mg by mouth daily at 6 (six) AM.   Yes [provider]  ALPRAZolam (XANAX) 0.5 MG tablet Take 1 tablet (0.5 mg total) by mouth as needed for anxiety (for sedation before MRI scan; take 1 hour before scan; may repeat 15 min before scan). Patient not taking: Reported on 01/11/2017 11/02/15   Penumalli, Earlean Polka, MD  citalopram (CELEXA) 10 MG tablet 10 mg daily. 12/01/15   [provider]  gabapentin (NEURONTIN) 300 MG capsule Take 1 capsule (300 mg total) by mouth 3 (three) times daily. Patient not taking: Reported on 12/06/2015 11/02/15   Penumalli, Earlean Polka, MD  hydrochlorothiazide (HYDRODIURIL) 12.5 MG tablet 12.5 mg daily. 12/01/15   [provider]  traMADol (ULTRAM) 50 MG tablet Take by mouth every 6 (six) hours as needed. Reported on 12/06/2015    [provider]    Family History Family History  Problem Relation Age of Onset  .  Hypothyroidism Mother   . Hypertension Father     Social History Social History  Substance Use Topics  . Smoking status: Current Every Day Smoker    Types: Cigarettes  . Smokeless tobacco: Never Used     Comment: 11/02/15 3-4 cigs/day, trying to quit  . Alcohol use Yes     Comment: 1 pint daily     Allergies   Patient has no known allergies.   Review of Systems Review of Systems  All other systems reviewed and are negative.    Physical Exam Updated Vital Signs BP (!) 146/98   Pulse 73   Temp 98 F (36.7 C) (Oral)   Resp (!) 23   Ht 5\' 5"  (1.651 m)   Wt 202 lb (91.6 kg)   SpO2 96%    BMI 33.61 kg/m   Physical Exam  Constitutional: He is oriented to person, place, and time. He appears well-developed and well-nourished. No distress.  HENT:  Head: Normocephalic and atraumatic.  Right Ear: External ear normal.  Left Ear: External ear normal.  Eyes: Conjunctivae and EOM are normal. Pupils are equal, round, and reactive to light.  Neck: Normal range of motion and phonation normal. Neck supple.  Cardiovascular: Normal rate, regular rhythm and normal heart sounds.   Pulmonary/Chest: Effort normal and breath sounds normal. No respiratory distress. He exhibits tenderness (Left anterior chest tenderness without crepitation or deformity.). He exhibits no bony tenderness.  Abdominal: Soft. There is no tenderness.  Musculoskeletal: Normal range of motion.  Neurological: He is alert and oriented to person, place, and time. No cranial nerve deficit or sensory deficit. He exhibits normal muscle tone. Coordination normal.  Skin: Skin is warm, dry and intact.  Psychiatric: He has a normal mood and affect. His behavior is normal. Judgment and thought content normal.  Nursing note and vitals reviewed.    ED Treatments / Results  Labs (all labs ordered are listed, but only abnormal results are displayed) Labs Reviewed  COMPREHENSIVE METABOLIC PANEL - Abnormal; Notable for the following:       Result Value   Potassium 3.2 (*)    Glucose, Bld 105 (*)    All other components within normal limits  ACETAMINOPHEN LEVEL - Abnormal; Notable for the following:    Acetaminophen (Tylenol), Serum <10 (*)    All other components within normal limits  RAPID URINE DRUG SCREEN, HOSP PERFORMED - Abnormal; Notable for the following:    Cocaine POSITIVE (*)    Tetrahydrocannabinol POSITIVE (*)    All other components within normal limits  ETHANOL  SALICYLATE LEVEL  CBC  I-STAT TROPOININ, ED    EKG  EKG Interpretation  Date/Time:  Friday Jan 11 2017 20:39:08 EDT Ventricular Rate:  90 PR  Interval:    QRS Duration: 74 QT Interval:  337 QTC Calculation: 413 R Axis:   7 Text Interpretation:  Sinus rhythm Abnormal R-wave progression, early transition Nonspecific T abnormalities, diffuse leads No old tracing to compare Confirmed by Daleen Bo [phone]) on 01/12/2017 12:13:52 AM       Radiology Dg Chest 2 View  Result Date: 01/11/2017 CLINICAL DATA:  Chest pain EXAM: CHEST  2 VIEW COMPARISON:  None. FINDINGS: The heart size and mediastinal contours are within normal limits. Both lungs are clear. The visualized skeletal structures are unremarkable. IMPRESSION: No active cardiopulmonary disease. Electronically Signed   By: Kerby Moors M.D.   On: 01/11/2017 21:59    Procedures Procedures (including critical care time)  Medications Ordered in  ED Medications - No data to display   Initial Impression / Assessment and Plan / ED Course  I have reviewed the triage vital signs and the nursing notes.  Pertinent labs & imaging results that were available during my care of the patient were reviewed by me and considered in my medical decision making (see chart for details).     Medications - No data to display  Patient Vitals for the past 24 hrs:  BP Temp Temp src Pulse Resp SpO2 Height Weight  01/11/17 2245 (!) 146/98 - - 73 (!) 23 96 % - -  01/11/17 2215 (!) 132/93 - - (!) 111 19 94 % - -  01/11/17 2200 (!) 130/94 - - 79 16 95 % - -  01/11/17 2100 134/90 - - 85 11 96 % - -  01/11/17 2045 (!) 125/96 - - 88 12 95 % 5\' 5"  (1.651 m) 202 lb (91.6 kg)  01/11/17 2037 (!) 122/91 98 F (36.7 C) Oral 87 13 97 % - -   TTS consultation   Final Clinical Impressions(s) / ED Diagnoses   Final diagnoses:  Suicidal ideation  Cocaine abuse  Chest wall pain   Suicidal ideation, cocaine abuse and aggressive behavior.  Incidental chest discomfort which is consistent with chest wall pain, possibly related to recent straining, lifting a lawnmower.  Doubt ACS, PE or pneumonia.  Nursing  Notes Reviewed/ Care Coordinated Applicable Imaging Reviewed Interpretation of Laboratory Data incorporated into ED treatment  Plan-as per TTS in conjunction with oncoming provider team   New Prescriptions New Prescriptions   No medications on file     Daleen Bo, MD 01/11/17 2356    Daleen Bo, MD 01/12/17 [phone]

## 2017-01-11 NOTE — ED Triage Notes (Signed)
Patient from [facility] going to [facility] for medical clearance when he started CO of sharp Left sided intermittent  chest pain with no associated symptoms for the last hour. EMS administered 324 ASA, no nitro. Not complaining of pain at his time. Hy of alcohol and cocaine abuse and states that this has happened before

## 2017-01-11 NOTE — ED Notes (Signed)
Per [facility], states detoxing from [facility] taking papers out for SI

## 2017-01-11 NOTE — ED Notes (Signed)
Food and drink given to pt per provider.

## 2017-01-11 NOTE — ED Notes (Signed)
Pt was becoming anxious wanting to call home, stating that he needed to check on his children.  RN arranged for him to use the phone.  Pt was able to calm down.

## 2017-01-12 ENCOUNTER — Inpatient Hospital Stay (HOSPITAL_COMMUNITY)
Admission: AD | Admit: 2017-01-12 | Discharge: 2017-01-16 | DRG: 885 | Disposition: A | Discharge status: Home / Self Care | Payer: No Typology Code available for payment source | Source: Intra-hospital | Attending: Psychiatry | Admitting: Psychiatry

## 2017-01-12 ENCOUNTER — Encounter (HOSPITAL_COMMUNITY): Payer: Self-pay

## 2017-01-12 DIAGNOSIS — Z915 Personal history of self-harm: Secondary | ICD-10-CM | POA: Diagnosis not present

## 2017-01-12 DIAGNOSIS — G4700 Insomnia, unspecified: Secondary | ICD-10-CM | POA: Diagnosis present

## 2017-01-12 DIAGNOSIS — R45851 Suicidal ideations: Secondary | ICD-10-CM | POA: Diagnosis present

## 2017-01-12 DIAGNOSIS — F1910 Other psychoactive substance abuse, uncomplicated: Secondary | ICD-10-CM | POA: Diagnosis present

## 2017-01-12 DIAGNOSIS — F1994 Other psychoactive substance use, unspecified with psychoactive substance-induced mood disorder: Secondary | ICD-10-CM | POA: Diagnosis present

## 2017-01-12 DIAGNOSIS — F17210 Nicotine dependence, cigarettes, uncomplicated: Secondary | ICD-10-CM | POA: Diagnosis present

## 2017-01-12 DIAGNOSIS — F332 Major depressive disorder, recurrent severe without psychotic features: Principal | ICD-10-CM | POA: Diagnosis present

## 2017-01-12 DIAGNOSIS — Z79899 Other long term (current) drug therapy: Secondary | ICD-10-CM

## 2017-01-12 LAB — TSH: TSH: 2.755 u[IU]/mL (ref 0.350–4.500)

## 2017-01-12 LAB — LIPID PANEL
CHOL/HDL RATIO: 3.4 ratio
Cholesterol: 205 mg/dL — ABNORMAL HIGH (ref 0–200)
HDL: 61 mg/dL (ref >40)
LDL Cholesterol: 130 mg/dL — ABNORMAL HIGH (ref 0–99)
Triglycerides: 70 mg/dL (ref <150)
VLDL: 14 mg/dL (ref 0–40)

## 2017-01-12 MED ORDER — ADULT MULTIVITAMIN W/MINERALS CH
1.0000 | ORAL_TABLET | Freq: Every day | ORAL | Status: DC
  Administered 2017-01-12 – 2017-01-15 (×4): 1 via ORAL
  Filled 2017-01-12 (×7): qty 1

## 2017-01-12 MED ORDER — RISPERIDONE 0.5 MG PO TABS
0.5000 mg | ORAL_TABLET | Freq: Two times a day (BID) | ORAL | Status: DC
  Administered 2017-01-12 – 2017-01-15 (×7): 0.5 mg via ORAL
  Filled 2017-01-12 (×9): qty 1
  Filled 2017-01-12: qty 28
  Filled 2017-01-12: qty 1
  Filled 2017-01-12: qty 28

## 2017-01-12 MED ORDER — THIAMINE HCL 100 MG/ML IJ SOLN
100.0000 mg | Freq: Once | INTRAMUSCULAR | Status: AC
  Administered 2017-01-12: 100 mg via INTRAMUSCULAR
  Filled 2017-01-12: qty 2

## 2017-01-12 MED ORDER — VORTIOXETINE HBR 5 MG PO TABS
5.0000 mg | ORAL_TABLET | Freq: Every day | ORAL | Status: DC
  Filled 2017-01-12 (×3): qty 1

## 2017-01-12 MED ORDER — NICOTINE 21 MG/24HR TD PT24
21.0000 mg | MEDICATED_PATCH | Freq: Every day | TRANSDERMAL | Status: DC
  Administered 2017-01-12 – 2017-01-15 (×5): 21 mg via TRANSDERMAL
  Filled 2017-01-12: qty 14
  Filled 2017-01-12 (×6): qty 1

## 2017-01-12 MED ORDER — MAGNESIUM HYDROXIDE 400 MG/5ML PO SUSP: 30.0000 mL | Freq: Every day | ORAL | Status: DC | PRN

## 2017-01-12 MED ORDER — GABAPENTIN 400 MG PO CAPS
400.0000 mg | ORAL_CAPSULE | Freq: Three times a day (TID) | ORAL | Status: DC
  Administered 2017-01-12 – 2017-01-15 (×12): 400 mg via ORAL
  Filled 2017-01-12 (×2): qty 1
  Filled 2017-01-12: qty 42
  Filled 2017-01-12 (×3): qty 1
  Filled 2017-01-12: qty 42
  Filled 2017-01-12: qty 1
  Filled 2017-01-12: qty 42
  Filled 2017-01-12 (×8): qty 1

## 2017-01-12 MED ORDER — GABAPENTIN 400 MG PO CAPS
400.0000 mg | ORAL_CAPSULE | Freq: Three times a day (TID) | ORAL | Status: DC
  Administered 2017-01-12: 400 mg via ORAL
  Filled 2017-01-12: qty 1

## 2017-01-12 MED ORDER — HYDROXYZINE HCL 25 MG PO TABS
25.0000 mg | ORAL_TABLET | Freq: Four times a day (QID) | ORAL | Status: AC | PRN
  Administered 2017-01-12 (×2): 25 mg via ORAL
  Filled 2017-01-12 (×2): qty 1

## 2017-01-12 MED ORDER — HYDROXYZINE HCL 25 MG PO TABS
25.0000 mg | ORAL_TABLET | Freq: Three times a day (TID) | ORAL | Status: DC | PRN
  Administered 2017-01-12: 25 mg via ORAL
  Filled 2017-01-12: qty 1

## 2017-01-12 MED ORDER — VORTIOXETINE HBR 5 MG PO TABS: 5.0000 mg | ORAL_TABLET | Freq: Every day | ORAL | Status: DC

## 2017-01-12 MED ORDER — VORTIOXETINE HBR 5 MG PO TABS
5.0000 mg | ORAL_TABLET | Freq: Every day | ORAL | Status: DC
  Administered 2017-01-12 – 2017-01-15 (×4): 5 mg via ORAL
  Filled 2017-01-12 (×2): qty 1
  Filled 2017-01-12: qty 14
  Filled 2017-01-12 (×2): qty 1

## 2017-01-12 MED ORDER — HYDROXYZINE PAMOATE 25 MG PO CAPS
25.0000 mg | ORAL_CAPSULE | Freq: Three times a day (TID) | ORAL | Status: DC | PRN
  Filled 2017-01-12: qty 1

## 2017-01-12 MED ORDER — NICOTINE 21 MG/24HR TD PT24
MEDICATED_PATCH | TRANSDERMAL | Status: AC
  Administered 2017-01-12: 21 mg via TRANSDERMAL
  Filled 2017-01-12: qty 1

## 2017-01-12 MED ORDER — TRAZODONE HCL 50 MG PO TABS
50.0000 mg | ORAL_TABLET | Freq: Every evening | ORAL | Status: DC | PRN
  Administered 2017-01-12 – 2017-01-15 (×6): 50 mg via ORAL
  Filled 2017-01-12 (×3): qty 1
  Filled 2017-01-12: qty 28
  Filled 2017-01-12 (×5): qty 1
  Filled 2017-01-12: qty 28
  Filled 2017-01-12 (×3): qty 1

## 2017-01-12 MED ORDER — POTASSIUM CHLORIDE CRYS ER 20 MEQ PO TBCR
40.0000 meq | EXTENDED_RELEASE_TABLET | Freq: Once | ORAL | Status: AC
  Administered 2017-01-12: 40 meq via ORAL
  Filled 2017-01-12: qty 2

## 2017-01-12 MED ORDER — ACETAMINOPHEN 325 MG PO TABS
650.0000 mg | ORAL_TABLET | Freq: Four times a day (QID) | ORAL | Status: DC | PRN
  Administered 2017-01-13 – 2017-01-15 (×3): 650 mg via ORAL
  Filled 2017-01-12 (×3): qty 2

## 2017-01-12 MED ORDER — VITAMIN D3 25 MCG (1000 UNIT) PO TABS
1000.0000 [IU] | ORAL_TABLET | Freq: Every day | ORAL | Status: DC
  Administered 2017-01-12 – 2017-01-15 (×4): 1000 [IU] via ORAL
  Filled 2017-01-12 (×2): qty 1
  Filled 2017-01-12: qty 14
  Filled 2017-01-12 (×4): qty 1

## 2017-01-12 MED ORDER — ALUM & MAG HYDROXIDE-SIMETH 200-200-20 MG/5ML PO SUSP: 30.0000 mL | ORAL | Status: DC | PRN

## 2017-01-12 MED ORDER — VORTIOXETINE HBR 10 MG PO TABS
5.0000 mg | ORAL_TABLET | Freq: Every day | ORAL | Status: DC
Start: 2017-01-12 — End: 2017-01-12
  Filled 2017-01-12: qty 1

## 2017-01-12 MED ORDER — VITAMIN B-1 100 MG PO TABS
100.0000 mg | ORAL_TABLET | Freq: Every day | ORAL | Status: DC
  Administered 2017-01-13 – 2017-01-15 (×3): 100 mg via ORAL
  Filled 2017-01-12 (×5): qty 1

## 2017-01-12 MED ORDER — HYDROCHLOROTHIAZIDE 25 MG PO TABS: 12.5000 mg | ORAL_TABLET | Freq: Every day | ORAL | Status: DC

## 2017-01-12 MED ORDER — LORAZEPAM 1 MG PO TABS
1.0000 mg | ORAL_TABLET | Freq: Four times a day (QID) | ORAL | Status: AC | PRN
  Administered 2017-01-12 – 2017-01-14 (×2): 1 mg via ORAL
  Filled 2017-01-12 (×2): qty 1

## 2017-01-12 MED ORDER — MELOXICAM 7.5 MG PO TABS
7.5000 mg | ORAL_TABLET | Freq: Every day | ORAL | Status: DC
  Administered 2017-01-12 – 2017-01-15 (×4): 7.5 mg via ORAL
  Filled 2017-01-12 (×2): qty 1
  Filled 2017-01-12: qty 14
  Filled 2017-01-12 (×3): qty 1

## 2017-01-12 MED ORDER — ONDANSETRON 4 MG PO TBDP: 4.0000 mg | ORAL_TABLET | Freq: Four times a day (QID) | ORAL | Status: AC | PRN

## 2017-01-12 MED ORDER — LOPERAMIDE HCL 2 MG PO CAPS
2.0000 mg | ORAL_CAPSULE | ORAL | Status: AC | PRN
  Administered 2017-01-12: 4 mg via ORAL
  Filled 2017-01-12: qty 2

## 2017-01-12 NOTE — Tx Team (Signed)
Initial Treatment Plan 01/12/2017 5:36 AM [name] GPQ:[id number]    PATIENT STRESSORS: Financial difficulties Marital or family conflict Substance abuse Traumatic event   PATIENT STRENGTHS: Ability for insight Capable of independent living Communication skills Supportive family/friends   PATIENT IDENTIFIED PROBLEMS: "To stay off drugs and alcohol"  "How to deal with my family when we are in crisis"  Depression  Anxiety   Risk for suicide  Substance Abuse           DISCHARGE CRITERIA:  Ability to meet basic life and health needs Safe-care adequate arrangements made Verbal commitment to aftercare and medication compliance Withdrawal symptoms are absent or subacute and managed without 24-hour nursing intervention  PRELIMINARY DISCHARGE PLAN: Return to previous work or school arrangements  PATIENT/FAMILY INVOLVEMENT: This treatment plan has been presented to and reviewed with the patient, [name], and/or family member.  The patient and family have been given the opportunity to ask questions and make suggestions.  Leia Alf, RN 01/12/2017, 5:36 AM

## 2017-01-12 NOTE — Progress Notes (Signed)
Patient attended group and said that his day was a 8. His wife and kids visited his today and he was able to apologize to them for his behavior. His goal for tomorrow is to see the physician to discuss his medications and get information about the 28 day program.
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
## 2017-01-12 NOTE — Progress Notes (Signed)
D: Patient's self inventory sheet: patient has poor sleep, recieved sleep medication.fair  Appetite, low energy level, poor concentration. Rated depression 8/10, hopeless 9/10, anxiety 8/10. SI/HI/AVH: Endorses thoughts of suicide "everyone would be better off without me, I'm worth more dead". Denies  HI and psychosis. Physical complaints are back and knee pain, severe. Goal is "get rid of the thought of killing myself". Plans to work on "read and call my wife and kids".   A: Medications administered, assessed medication knowledge and education given on medication regimen.  Emotional support and encouragement given patient. R: Continues to have  SI and deny HI , contracts for safety. Safety maintained with 15 minute checks.
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
## 2017-01-12 NOTE — BHH Group Notes (Signed)
Adult Therapy Group Note  Date:  01/12/2017  Time:  10:00-11:00AM  Group Topic/Focus: Unhealthy vs Healthy Coping Techniques  Building Self Esteem:    The focus of this group was to determine what unhealthy coping techniques typically are used or and what healthy coping techniques would be helpful in coping with various problems. Patients were guided in becoming aware of the differences between healthy and unhealthy coping techniques.  Vignettes were used to generate ideas, which led to a discussion about personal application.     Participation Level:  Active  Participation Quality:  Attentive and Sharing  Affect:  Blunted  Cognitive:  Appropriate  Insight: Good  Engagement in Group:  Developing/Improving  Modes of Intervention:  Exercise, Discussion and Support  Additional Comments:  The patient was late to group and so we did not get to talk about his unhealthy or healthy coping skills; however, once he arrived in group he did respond to questions/vignettes/comments.  Selmer Dominion, LCSW 01/12/2017   3:53 PM

## 2017-01-12 NOTE — Progress Notes (Signed)
Admission Note  Pt is a 35 [id number] male admitted onto the 400 I/P adult unit. Pt at the time of assessment endorses moderate anxiety, depression and suicidal ideation; states, "this is not the life I should be leaving; I have tried to kill myself several times and even jump off a bridge and broke my ankles and back." -Pt is unsteady and a high fall risk due to jumping off the bridge. Pt also endorsed chronic back pain that gets worse with activity. Pt admit to alcohol, cocaine and THC use. Pt stated goals are "To stay off drugs and alcohol" and "How to deal with my family when we are in crisis." Support, encouragement, and safe environment provided.  15-minute safety checks initiated and continued. Pt was searched and no contraband found.

## 2017-01-13 DIAGNOSIS — F17210 Nicotine dependence, cigarettes, uncomplicated: Secondary | ICD-10-CM

## 2017-01-13 DIAGNOSIS — F1490 Cocaine use, unspecified, uncomplicated: Secondary | ICD-10-CM

## 2017-01-13 LAB — HEMOGLOBIN A1C
Hgb A1c MFr Bld: 6.2 % — ABNORMAL HIGH (ref 4.8–5.6)
Mean Plasma Glucose: 131 mg/dL

## 2017-01-13 MED ORDER — DICLOFENAC SODIUM 1 % TD GEL
2.0000 g | Freq: Four times a day (QID) | TRANSDERMAL | Status: DC
  Administered 2017-01-13 – 2017-01-15 (×10): 2 g via TOPICAL
  Filled 2017-01-13 (×2): qty 100

## 2017-01-13 MED ORDER — LIDOCAINE 5 % EX PTCH
1.0000 | MEDICATED_PATCH | CUTANEOUS | Status: DC
  Administered 2017-01-13 – 2017-01-14 (×2): 1 via TRANSDERMAL
  Filled 2017-01-13 (×3): qty 1

## 2017-01-13 NOTE — BHH Group Notes (Signed)
[place] Group Notes:  (Nursing/MHT/Case Management/Adjunct)  Date:  01/13/2017  Time:  4:17 PM  Type of Therapy:  Psychoeducational Skills  Participation Level:  Active  Participation Quality:  Attentive  Affect:  Appropriate  Cognitive:  Appropriate  Insight:  Appropriate  Engagement in Group:  Engaged  Modes of Intervention:  Discussion and Education  Summary of Progress/Problems: Healthy Support Systems - patient attended, was engaged with content and peers. [name] [name] 01/13/2017, 4:17 PM
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
## 2017-01-13 NOTE — Progress Notes (Signed)
Patient attended group and said that his day was a 7. His day was exciting because he spoke to his physician and the social worker.

## 2017-01-13 NOTE — Progress Notes (Signed)
D: Pt passive SI/ AH- non command- contracts for safety, denies HI/ VH. Pt is pleasant and cooperative. Pt  Better he felt he wanted to go to a 28 day program when he leaves. Pt appeared to have a brighter affect, pt did visit with his wife this evening.   A: Pt was offered support and encouragement. Pt was given scheduled medications. Pt was encourage to attend groups. Q 15 minute checks were done for safety.   R:Pt attends groups and interacts well with peers and staff. Pt is taking medication. Pt has no complaints.Pt receptive to treatment and safety maintained on unit.

## 2017-01-13 NOTE — Plan of Care (Signed)
Problem: Safety: Goal: Ability to disclose and discuss suicidal ideas will improve Outcome: Progressing Pt SI but contracted for safety

## 2017-01-13 NOTE — Progress Notes (Signed)
[name] had been up and visible in milieu this evening, did attend and participate in evening group activity. He spoke briefly about circumstances leading up to hospitalization and also spoke about his past hospitalizations and the various medications that he had been on. Lotus was able to receive all bedtime medications without incident this evening. A. Support and encouragement provided. R. Safety maintained, will continue to monitor.

## 2017-01-13 NOTE — Progress Notes (Signed)
[facility] MD Progress Note  01/13/2017 2:22 PM [name]  MRN:  [id number] Subjective:  "I feel better today in terms of not wanting to die as much. Still having some mumbling voices and racing thoughts but it's way better. I can focus better today."  Objective: Pt seen and chart reviewed. Pt is alert/oriented x4, calm, cooperative, and appropriate to situation. Pt denies homicidal ideation and does not appear to be responding to internal stimuli. Pt reports that he feels suicidal yet much improved and is able to contract for safety. Pt reports an improvement in hallucinations stating that he has some racing thoughts but they are much more quiet.   Principal Problem: Substance induced mood disorder ([place]) Diagnosis:   Patient Active Problem List   Diagnosis Date Noted  . MDD (major depressive disorder), recurrent severe, without psychosis ([place]) [F33.2] 01/12/2017    Priority: High  . Polysubstance abuse [F19.10] 01/12/2017    Priority: High  . Substance induced mood disorder [facility]) [F19.94] 01/12/2017    Priority: High   Total Time spent with patient: 30 minutes  Past Psychiatric History: see H&P  Past Medical History:  Past Medical History:  Diagnosis Date  . Lumbago with sciatica    left side  . Vitamin D deficiency     Past Surgical History:  Procedure Laterality Date  . BACK SURGERY     yrs ago   Family History:  Family History  Problem Relation Age of Onset  . Hypothyroidism Mother   . Hypertension Father    Family Psychiatric  History: see h&P Social History:  History  Alcohol Use  . Yes    Comment: 1 pint daily     History  Drug Use  . Types: Cocaine    Social History   Social History  . Marital status: Married    Spouse name: [name]  . Number of children: 4  . Years of education: 12   Occupational History  .      unemployed   Social History Main Topics  . Smoking status: Current Every Day Smoker    Types: Cigarettes  . Smokeless tobacco:  Never Used     Comment: 11/02/15 3-4 cigs/day, trying to quit  . Alcohol use Yes     Comment: 1 pint daily  . Drug use: Yes    Types: Cocaine  . Sexual activity: Not Asked   Other Topics Concern  . None   Social History Narrative   Lives at home wife, children   Caffeine use- coffee 2-3 cups    Additional Social History:    Pain Medications: See MAR Prescriptions: See MAR Over the Counter: See MAR History of alcohol / drug use?: Yes Longest period of sobriety (when/how long): 4 years Negative Consequences of Use: Financial, Personal relationships, Work / Youth worker Withdrawal Symptoms: Agitation, Aggressive/Assaultive, Irritability Name of Substance 1: Alcohol 1 - Age of First Use: 24 or 51 years old 1 - Amount (size/oz): 0.5 to 1.0 pints  1 - Frequency: 2x Weekly 1 - Duration: Ongoing 1 - Last Use / Amount: 01/10/2017 Name of Substance 2: Cocaine 2 - Age of First Use: 51 years old 2 - Amount (size/oz): 0.5 grams 2 - Frequency: 2x Monthly 2 - Duration: Ongoing 2 - Last Use / Amount: 01/10/2017 Name of Substance 3: Marijuana 3 - Age of First Use: 51 years old 3 - Amount (size/oz): 3 grams 3 - Frequency: 5x Weekly 3 - Duration: Ongoing 3 - Last Use / Amount: 01/10/2017  Sleep: Good  Appetite:  Good  Current Medications: Current Facility-Administered Medications  Medication Dose Route Frequency Provider Last Rate Last Dose  . acetaminophen (TYLENOL) tablet 650 mg  650 mg Oral Q6H PRN Lindon Romp A, NP   650 mg at 01/13/17 0608  . alum & mag hydroxide-simeth (MAALOX/MYLANTA) 200-200-20 MG/5ML suspension 30 mL  30 mL Oral Q4H PRN Rozetta Nunnery, NP      . cholecalciferol (VITAMIN D) tablet 1,000 Units  1,000 Units Oral Daily Lindon Romp A, NP   1,000 Units at 01/13/17 0908  . diclofenac sodium (VOLTAREN) 1 % transdermal gel 2 g  2 g Topical QID Benjamine Mola, FNP   2 g at 01/13/17 1007  . gabapentin (NEURONTIN) capsule 400 mg  400 mg Oral TID Lindon Romp A, NP   400 mg at 01/13/17 0907  . hydrOXYzine (ATARAX/VISTARIL) tablet 25 mg  25 mg Oral Q6H PRN Lindon Romp A, NP   25 mg at 01/12/17 1821  . lidocaine (LIDODERM) 5 % 1 patch  1 patch Transdermal Q24H Naeemah Jasmer C, FNP      . loperamide (IMODIUM) capsule 2-4 mg  2-4 mg Oral PRN Lindon Romp A, NP   4 mg at 01/12/17 0342  . LORazepam (ATIVAN) tablet 1 mg  1 mg Oral Q6H PRN Lindon Romp A, NP   1 mg at 01/12/17 0342  . magnesium hydroxide (MILK OF MAGNESIA) suspension 30 mL  30 mL Oral Daily PRN Lindon Romp A, NP      . meloxicam (MOBIC) tablet 7.5 mg  7.5 mg Oral Daily Esmerelda Finnigan C, FNP   7.5 mg at 01/12/17 1821  . multivitamin with minerals tablet 1 tablet  1 tablet Oral Daily Lindon Romp A, NP   1 tablet at 01/13/17 0907  . nicotine (NICODERM CQ - dosed in mg/24 hours) patch 21 mg  21 mg Transdermal Daily Benjamine Mola, FNP   21 mg at 01/13/17 0909  . ondansetron (ZOFRAN-ODT) disintegrating tablet 4 mg  4 mg Oral Q6H PRN Lindon Romp A, NP      . risperiDONE (RISPERDAL) tablet 0.5 mg  0.5 mg Oral BID Benjamine Mola, FNP   0.5 mg at 01/13/17 0908  . thiamine (VITAMIN B-1) tablet 100 mg  100 mg Oral Daily Lindon Romp A, NP   100 mg at 01/13/17 0908  . traZODone (DESYREL) tablet 50 mg  50 mg Oral QHS,MR X 1 Lindon Romp A, NP   50 mg at 01/12/17 2110  . vortioxetine HBr (TRINTELLIX) TABS 5 mg  5 mg Oral QHS Cobos, Myer Peer, MD   5 mg at 01/12/17 2110    Lab Results:  Results for orders placed or performed during the hospital encounter of 01/12/17 (from the past 48 hour(s))  Hemoglobin A1c     Status: Abnormal   Collection Time: 01/12/17  6:20 AM  Result Value Ref Range   Hgb A1c MFr Bld 6.2 (H) 4.8 - 5.6 %    Comment: (NOTE)         Pre-diabetes: 5.7 - 6.4         Diabetes: >6.4         Glycemic control for adults with diabetes: <7.0    Mean Plasma Glucose 131 mg/dL    Comment: (NOTE) Performed At: [facility] [place], [place]  [id number] Lindon Romp MD [id number] Performed at [facility], [place] [address], [place], [place] [address]  Lipid panel     Status: Abnormal   Collection Time: 01/12/17  6:20 AM  Result Value Ref Range   Cholesterol 205 (H) 0 - 200 mg/dL   Triglycerides 70 <150 mg/dL   HDL 61 >40 mg/dL   Total CHOL/HDL Ratio 3.4 RATIO   VLDL 14 0 - 40 mg/dL   LDL Cholesterol 130 (H) 0 - 99 mg/dL    Comment:        Total Cholesterol/HDL:CHD Risk Coronary Heart Disease Risk Table                     Men   Women  1/2 Average Risk   3.4   3.3  Average Risk       5.0   4.4  2 X Average Risk   9.6   7.1  3 X Average Risk  23.4   11.0        Use the calculated Patient Ratio above and the CHD Risk Table to determine the patient's CHD Risk.        ATP III CLASSIFICATION (LDL):  <100     mg/dL   Optimal  100-129  mg/dL   Near or Above                    Optimal  130-159  mg/dL   Borderline  160-189  mg/dL   High  >190     mg/dL   Very High Performed at [facility] [address], [place], [place] [address]   TSH     Status: None   Collection Time: 01/12/17  6:20 AM  Result Value Ref Range   TSH 2.755 0.350 - 4.500 uIU/mL    Comment: Performed by a 3rd Generation assay with a functional sensitivity of <=0.01 uIU/mL. Performed at [facility], [place] [address], [place],  [address]     Blood Alcohol level:  Lab Results  Component Value Date   ETH <5 51/88/4166    Metabolic Disorder Labs: Lab Results  Component Value Date   HGBA1C 6.2 (H) 01/12/2017   MPG 131 01/12/2017   No results found for: PROLACTIN Lab Results  Component Value Date   CHOL 205 (H) 01/12/2017   TRIG 70 01/12/2017   HDL 61 01/12/2017   CHOLHDL 3.4 01/12/2017   VLDL 14 01/12/2017   LDLCALC 130 (H) 01/12/2017    Physical Findings: AIMS: Facial and Oral Movements Muscles of Facial Expression: None, normal Lips and Perioral Area: None, normal Jaw:  None, normal Tongue: None, normal,Extremity Movements Upper (arms, wrists, hands, fingers): None, normal Lower (legs, knees, ankles, toes): None, normal, Trunk Movements Neck, shoulders, hips: None, normal, Overall Severity Severity of abnormal movements (highest score from questions above): None, normal Incapacitation due to abnormal movements: None, normal Patient's awareness of abnormal movements (rate only patient's report): No Awareness, Dental Status Current problems with teeth and/or dentures?: Yes Does patient usually wear dentures?: Yes  CIWA:  CIWA-Ar Total: 4 COWS:     Musculoskeletal: Strength & Muscle Tone: within normal limits Gait & Station: normal Patient leans: N/A  Psychiatric Specialty Exam: Physical Exam  Review of Systems  Musculoskeletal: Positive for back pain.  Psychiatric/Behavioral: Positive for depression, hallucinations (improving), substance abuse and suicidal ideas (improving). The patient is nervous/anxious and has insomnia.   All other systems reviewed and are negative.   Blood pressure (!) 122/94, pulse 85, temperature 98.2 F (36.8 C), temperature source Oral, resp. rate 18,  height 5\' 5"  (1.651 m), weight 91.6 kg (202 lb).Body mass index is 33.61 kg/m.  General Appearance: Casual and Fairly Groomed  Eye Contact:  Good  Speech:  Clear and Coherent and Normal Rate  Volume:  Normal  Mood:  Anxious and Depressed  Affect:  Appropriate, Congruent and Depressed  Thought Process:  Coherent, Goal Directed, Linear and Descriptions of Associations: Loose  Orientation:  Full (Time, Place, and Person)  Thought Content:  Focused on treatment plans, back pain  Suicidal Thoughts:  Yes.  without intent/plan  Homicidal Thoughts:  No  Memory:  Immediate;   Fair Recent;   Fair Remote;   Fair  Judgement:  Fair  Insight:  Fair  Psychomotor Activity:  Normal  Concentration:  Concentration: Fair and Attention Span: Fair  Recall:  [employer] of Knowledge:  Fair   Language:  Fair  Akathisia:  No  Handed:    AIMS (if indicated):     Assets:  Communication Skills Desire for Improvement Resilience Social Support  ADL's:  Intact  Cognition:  WNL  Sleep:  Number of Hours: 6.5   Treatment Plan Summary: Substance induced mood disorder ([place]) with MDD, recurrent, severe, with psychosis, treated as below:  On 01/13/2017 , I have reviewed medications below and concur with regimen with the following changes:   Medications: -Vistaril 25mg  po q6h prn anxiety -Ativan CIWA (prn only, not full protocol, minimal risk at this time) -Neurontin 400mg  po tid for chronic pain and anxiety -Mobic 7.5mg  po daily for chronic back/ankle pain (jumped from bridge, chronic injuries; walks with limp) -Risperidone 0.5mg  po bid for racing thoughts and psychotic features -Trazodone 50mg  po qhs prn insomnia -Continue home Trintellix 5mg  po qhs for depression (pt states this works well for him)  -Start Lidoderm 5% patch for lower back pain  Labs/tests/other: (reviewed on 01/12/17) -Reviewed UDS and + cocaine/opiates -Reviewed CBC, CMP (low K+ 3.2 but was treated in ED), lipid panel mildly elevated but could be controlled with diet (borderline high) -Reviewed A1C 6.2H, TSH 2.755 -Reviewed EKG, QTC 413   Benjamine Mola, FNP 01/13/2017, 2:22 PM

## 2017-01-14 NOTE — BHH Group Notes (Signed)
[place] LCSW Group Therapy  01/14/2017 1:15pm  Type of Therapy: Group Therapy   Topic: Overcoming Obstacles  Participation Level: Active  Participation Quality: Appropriate   Affect: Appropriate  Cognitive: Appropriate and Oriented  Insight: Developing/Improving and Improving  Engagement in Therapy: Improving  Modes of Intervention: Discussion, Exploration, Problem-solving and Support  Description of Group:  In this group patients will be encouraged to explore what they see as obstacles to their own wellness and recovery. They will be guided to discuss their thoughts, feelings, and behaviors related to these obstacles. The group will process together ways to cope with barriers, with attention given to specific choices patients can make. Each patient will be challenged to identify changes they are motivated to make in order to overcome their obstacles. This group will be process-oriented, with patients participating in exploration of their own experiences as well as giving and receiving support and challenge from other group members.  Summary of Patient Progress:  Pt states that his biggest obstacle is people. Pt reports that there are certain people he spends time with that are negative influences on him. Pt recognized that the best way to protect his recovery after discharge is to cut his ties with a lot of those negative people. Pt also spoke about the relationship with his mother being strained. Pt stated "I think she hates me and I don't know why". Pt received encouraging feedback from his peers about his mother, however, pt was still focused on the negatives of the relationship and the pain that he feels because of it.  Therapeutic Modalities:  Cognitive Behavioral Therapy Solution Focused Therapy Motivational Interviewing Relapse Prevention [place], MSW, [place]

## 2017-01-14 NOTE — Progress Notes (Signed)
Pt got up at [place] complaining that his heart was beating fast. Pt  Bp/ HR taken 135/93  HR0-79 . Pt seemed to be in no acute distress, pt was visibly anxious , so pt was given 1 mg Ativan per [facility] and pt was observed for the next 5 minutes and appeared to visibly calm down. Pt was educated on breathing techniques.

## 2017-01-14 NOTE — Tx Team (Signed)
Interdisciplinary Treatment and Diagnostic Plan Update 01/14/2017 Time of Session: 9:30am  [name]  MRN: [id number]  Principal Diagnosis: Substance induced mood disorder ([place])  Secondary Diagnoses: Principal Problem:   Substance induced mood disorder ([place]) Active Problems:   MDD (major depressive disorder), recurrent severe, without psychosis ([place])   Polysubstance abuse   Current Medications:  Current Facility-Administered Medications  Medication Dose Route Frequency Provider Last Rate Last Dose  . acetaminophen (TYLENOL) tablet 650 mg  650 mg Oral Q6H PRN Lindon Romp A, NP   650 mg at 01/14/17 1022  . alum & mag hydroxide-simeth (MAALOX/MYLANTA) 200-200-20 MG/5ML suspension 30 mL  30 mL Oral Q4H PRN Rozetta Nunnery, NP      . cholecalciferol (VITAMIN D) tablet 1,000 Units  1,000 Units Oral Daily Lindon Romp A, NP   1,000 Units at 01/14/17 [phone]  . diclofenac sodium (VOLTAREN) 1 % transdermal gel 2 g  2 g Topical QID Benjamine Mola, FNP   2 g at 01/14/17 0834  . gabapentin (NEURONTIN) capsule 400 mg  400 mg Oral TID Lindon Romp A, NP   400 mg at 01/14/17 1210  . hydrOXYzine (ATARAX/VISTARIL) tablet 25 mg  25 mg Oral Q6H PRN Lindon Romp A, NP   25 mg at 01/12/17 1821  . lidocaine (LIDODERM) 5 % 1 patch  1 patch Transdermal Q24H Benjamine Mola, FNP   1 patch at 01/13/17 1825  . loperamide (IMODIUM) capsule 2-4 mg  2-4 mg Oral PRN Lindon Romp A, NP   4 mg at 01/12/17 0342  . LORazepam (ATIVAN) tablet 1 mg  1 mg Oral Q6H PRN Lindon Romp A, NP   1 mg at 01/14/17 0011  . magnesium hydroxide (MILK OF MAGNESIA) suspension 30 mL  30 mL Oral Daily PRN Lindon Romp A, NP      . meloxicam (MOBIC) tablet 7.5 mg  7.5 mg Oral Daily Withrow, John C, FNP   7.5 mg at 01/13/17 1737  . multivitamin with minerals tablet 1 tablet  1 tablet Oral Daily Lindon Romp A, NP   1 tablet at 01/14/17 [phone]  . nicotine (NICODERM CQ - dosed in mg/24 hours) patch 21 mg  21 mg Transdermal Daily Benjamine Mola, FNP   21 mg at 01/14/17 1660  . ondansetron (ZOFRAN-ODT) disintegrating tablet 4 mg  4 mg Oral Q6H PRN Lindon Romp A, NP      . risperiDONE (RISPERDAL) tablet 0.5 mg  0.5 mg Oral BID Benjamine Mola, FNP   0.5 mg at 01/14/17 6301  . thiamine (VITAMIN B-1) tablet 100 mg  100 mg Oral Daily Lindon Romp A, NP   100 mg at 01/14/17 6010  . traZODone (DESYREL) tablet 50 mg  50 mg Oral QHS,MR X 1 Lindon Romp A, NP   50 mg at 01/13/17 2146  . vortioxetine HBr (TRINTELLIX) TABS 5 mg  5 mg Oral QHS Cobos, Myer Peer, MD   5 mg at 01/13/17 2146    PTA Medications: Prescriptions Prior to Admission  Medication Sig Dispense Refill Last Dose  . gabapentin (NEURONTIN) 400 MG capsule Take 400 mg by mouth 3 (three) times daily.   01/12/2017 at Unknown time  . Cholecalciferol (VITAMIN D PO) Take 1 capsule by mouth daily. Reported on 11/02/2015   Unknown at Unknown time  . diclofenac sodium (VOLTAREN) 1 % GEL Apply topically 4 (four) times daily. Reported on 11/09/2015   Unknown at Unknown time  . hydrochlorothiazide (HYDRODIURIL) 12.5 MG tablet  12.5 mg daily.  1 Unknown at Unknown time  . hydrOXYzine (VISTARIL) 25 MG capsule Take 25 mg by mouth 3 (three) times daily as needed for anxiety.   Unknown at Unknown time  . ibuprofen (ADVIL,MOTRIN) 800 MG tablet Take 1 tablet (800 mg total) by mouth every 8 (eight) hours as needed. 21 tablet 0 Unknown at Unknown time  . traMADol (ULTRAM) 50 MG tablet Take by mouth every 6 (six) hours as needed. Reported on 12/06/2015   Unknown at Unknown time  . vortioxetine HBr (TRINTELLIX) 5 MG TABS Take 5 mg by mouth daily at 6 (six) AM.   Unknown at Unknown time  . [DISCONTINUED] ALPRAZolam (XANAX) 0.5 MG tablet Take 1 tablet (0.5 mg total) by mouth as needed for anxiety (for sedation before MRI scan; take 1 hour before scan; may repeat 15 min before scan). (Patient not taking: Reported on 01/11/2017) 3 tablet 0 Unknown at Unknown time    Treatment Modalities: Medication  Management, Group therapy, Case management,  1 to 1 session with clinician, Psychoeducation, Recreational therapy.  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 Supportive family/friends  Physician Treatment Plan for Primary Diagnosis: Substance induced mood disorder ([place]) Long Term Goal(s): Improvement in symptoms so as ready for discharge Short Term Goals: Ability to identify changes in lifestyle to reduce recurrence of condition will improve Ability to verbalize feelings will improve Ability to disclose and discuss suicidal ideas Ability to demonstrate self-control will improve Ability to identify and develop effective coping behaviors will improve Ability to maintain clinical measurements within normal limits will improve Compliance with prescribed medications will improve Ability to identify triggers associated with substance abuse/mental health issues will improve Ability to identify changes in lifestyle to reduce recurrence of condition will improve Ability to verbalize feelings will improve Ability to disclose and discuss suicidal ideas Ability to demonstrate self-control will improve Ability to identify and develop effective coping behaviors will improve Ability to maintain clinical measurements within normal limits will improve Compliance with prescribed medications will improve Ability to identify triggers associated with substance abuse/mental health issues will improve  Medication Management: Evaluate patient's response, side effects, and tolerance of medication regimen.  Therapeutic Interventions: 1 to 1 sessions, Unit Group sessions and Medication administration.  Evaluation of Outcomes: Not Met  Physician Treatment Plan for Secondary Diagnosis: Principal Problem:   Substance induced mood disorder ([place]) Active  Problems:   MDD (major depressive disorder), recurrent severe, without psychosis ([place])   Polysubstance abuse  Long Term Goal(s): Improvement in symptoms so as ready for discharge  Short Term Goals: Ability to identify changes in lifestyle to reduce recurrence of condition will improve Ability to verbalize feelings will improve Ability to disclose and discuss suicidal ideas Ability to demonstrate self-control will improve Ability to identify and develop effective coping behaviors will improve Ability to maintain clinical measurements within normal limits will improve Compliance with prescribed medications will improve Ability to identify triggers associated with substance abuse/mental health issues will improve Ability to identify changes in lifestyle to reduce recurrence of condition will improve Ability to verbalize feelings will improve Ability to disclose and discuss suicidal ideas Ability to demonstrate self-control will improve Ability to identify and develop effective coping behaviors will improve Ability to maintain clinical measurements within normal limits will improve Compliance with prescribed medications will improve Ability to identify triggers associated with substance abuse/mental health issues will improve  Medication Management:  Evaluate patient's response, side effects, and tolerance of medication regimen.  Therapeutic Interventions: 1 to 1 sessions, Unit Group sessions and Medication administration.  Evaluation of Outcomes: Not Met  RN Treatment Plan for Primary Diagnosis: Substance induced mood disorder ([place]) Long Term Goal(s): Knowledge of disease and therapeutic regimen to maintain health will improve  Short Term Goals: Ability to remain free from injury will improve and Compliance with prescribed medications will improve  Medication Management: RN will administer medications as ordered by provider, will assess and evaluate patient's response and provide  education to patient for prescribed medication. RN will report any adverse and/or side effects to prescribing provider.  Therapeutic Interventions: 1 on 1 counseling sessions, Psychoeducation, Medication administration, Evaluate responses to treatment, Monitor vital signs and CBGs as ordered, Perform/monitor CIWA, COWS, AIMS and Fall Risk screenings as ordered, Perform wound care treatments as ordered.  Evaluation of Outcomes: Not Met  LCSW Treatment Plan for Primary Diagnosis: Substance induced mood disorder ([place]) Long Term Goal(s): Safe transition to appropriate next level of care at discharge, Engage patient in therapeutic group addressing interpersonal concerns. Short Term Goals: Engage patient in aftercare planning with referrals and resources, Facilitate patient progression through stages of change regarding substance use diagnoses and concerns, Identify triggers associated with mental health/substance abuse issues and Increase skills for wellness and recovery  Therapeutic Interventions: Assess for all discharge needs, 1 to 1 time with Social worker, Explore available resources and support systems, Assess for adequacy in community support network, Educate family and significant other(s) on suicide prevention, Complete Psychosocial Assessment, Interpersonal group therapy.  Evaluation of Outcomes: Not Met  Progress in Treatment: Attending groups: Pt is new to milieu, continuing to assess  Participating in groups: Pt is new to milieu, continuing to assess  Taking medication as prescribed: Yes, MD continues to assess for medication changes as needed Toleration medication: Yes, no side effects reported at this time Family/Significant other contact made: No, CSW assessing for appropriate contact Patient understands diagnosis: Continuing to assess Discussing patient identified problems/goals with staff: Yes Medical problems stabilized or resolved: Yes Denies suicidal/homicidal ideation:  Yes Issues/concerns per patient self-inventory: None Other: N/A  New problem(s) identified: None identified at this time.   New Short Term/Long Term Goal(s): None identified at this time.   Discharge Plan or Barriers: Pt will discharge to residential treatment. [facility] referral and [facility] referral made 5/7.  Reason for Continuation of Hospitalization:  Anxiety  Depression Medication stabilization Suicidal ideation Withdrawal symptoms  Estimated Length of Stay: 3-5 days  Attendees: Patient: 01/14/2017 1:19 PM  Physician: Dr. Parke Poisson 01/14/2017 1:19 PM  Nursing: Sharl Ma RN; Opal Sidles, RN 01/14/2017 1:19 PM  RN Care Manager: Lars Pinks, RN 01/14/2017 1:19 PM  Social Worker: Matthew Saras, [place] 01/14/2017 1:19 PM  Recreational Therapist:  01/14/2017 1:19 PM  Other: Lindell Spar, NP; Samuel Jester, NP 01/14/2017 1:19 PM  Other:  01/14/2017 1:19 PM  Other: 01/14/2017 1:19 PM   Scribe for Treatment Team: Georga Kaufmann, MSW,LCSWA 01/14/2017 1:19 PM

## 2017-01-14 NOTE — Progress Notes (Signed)
[facility] MD Progress Note  01/14/2017 3:26 PM [name]  MRN:  [id number] Subjective:  "My wife found me with a razor.  She took it from me." Objective:  Pt seen and chart reviewed. Pt is alert/oriented x4, calm, cooperative, and appropriate to situation. Pt denies homicidal ideation and does not appear to be responding to internal stimuli.  He is visible in groups.  He is interacting well with others.  He is still having a hard time with sleep.  He reports that at one point, he was on " Seroquel 400 mg."  Principal Problem: Substance induced mood disorder ([place]) Diagnosis:   Patient Active Problem List   Diagnosis Date Noted  . MDD (major depressive disorder), recurrent severe, without psychosis ([place]) [F33.2] 01/12/2017  . Polysubstance abuse [F19.10] 01/12/2017  . Substance induced mood disorder ([place]) [F19.94] 01/12/2017   Total Time spent with patient: 30 minutes  Past Psychiatric History: see H&P  Past Medical History:  Past Medical History:  Diagnosis Date  . Lumbago with sciatica    left side  . Vitamin D deficiency     Past Surgical History:  Procedure Laterality Date  . BACK SURGERY     yrs ago   Family History:  Family History  Problem Relation Age of Onset  . Hypothyroidism Mother   . Hypertension Father    Family Psychiatric  History: see h&P Social History:  History  Alcohol Use  . Yes    Comment: 1 pint daily     History  Drug Use  . Types: Cocaine    Social History   Social History  . Marital status: Married    Spouse name: [name]  . Number of children: 4  . Years of education: 12   Occupational History  .      unemployed   Social History Main Topics  . Smoking status: Current Every Day Smoker    Types: Cigarettes  . Smokeless tobacco: Never Used     Comment: 11/02/15 3-4 cigs/day, trying to quit  . Alcohol use Yes     Comment: 1 pint daily  . Drug use: Yes    Types: Cocaine  . Sexual activity: Not Asked   Other Topics Concern   . None   Social History Narrative   Lives at home wife, children   Caffeine use- coffee 2-3 cups    Additional Social History:    Pain Medications: See MAR Prescriptions: See MAR Over the Counter: See MAR History of alcohol / drug use?: Yes Longest period of sobriety (when/how long): 4 years Negative Consequences of Use: Financial, Personal relationships, Work / Youth worker Withdrawal Symptoms: Agitation, Aggressive/Assaultive, Irritability Name of Substance 1: Alcohol 1 - Age of First Use: 43 or 51 years old 1 - Amount (size/oz): 0.5 to 1.0 pints  1 - Frequency: 2x Weekly 1 - Duration: Ongoing 1 - Last Use / Amount: 01/10/2017 Name of Substance 2: Cocaine 2 - Age of First Use: 51 years old 2 - Amount (size/oz): 0.5 grams 2 - Frequency: 2x Monthly 2 - Duration: Ongoing 2 - Last Use / Amount: 01/10/2017 Name of Substance 3: Marijuana 3 - Age of First Use: 51 years old 3 - Amount (size/oz): 3 grams 3 - Frequency: 5x Weekly 3 - Duration: Ongoing 3 - Last Use / Amount: 01/10/2017              Sleep: Good  Appetite:  Good  Current Medications: Current Facility-Administered Medications  Medication Dose Route Frequency Provider  Last Rate Last Dose  . acetaminophen (TYLENOL) tablet 650 mg  650 mg Oral Q6H PRN Lindon Romp A, NP   650 mg at 01/14/17 1022  . alum & mag hydroxide-simeth (MAALOX/MYLANTA) 200-200-20 MG/5ML suspension 30 mL  30 mL Oral Q4H PRN Rozetta Nunnery, NP      . cholecalciferol (VITAMIN D) tablet 1,000 Units  1,000 Units Oral Daily Lindon Romp A, NP   1,000 Units at 01/14/17 [phone]  . diclofenac sodium (VOLTAREN) 1 % transdermal gel 2 g  2 g Topical QID Benjamine Mola, FNP   2 g at 01/14/17 0834  . gabapentin (NEURONTIN) capsule 400 mg  400 mg Oral TID Lindon Romp A, NP   400 mg at 01/14/17 1210  . hydrOXYzine (ATARAX/VISTARIL) tablet 25 mg  25 mg Oral Q6H PRN Lindon Romp A, NP   25 mg at 01/12/17 1821  . lidocaine (LIDODERM) 5 % 1 patch  1 patch  Transdermal Q24H Benjamine Mola, FNP   1 patch at 01/13/17 1825  . loperamide (IMODIUM) capsule 2-4 mg  2-4 mg Oral PRN Lindon Romp A, NP   4 mg at 01/12/17 0342  . LORazepam (ATIVAN) tablet 1 mg  1 mg Oral Q6H PRN Lindon Romp A, NP   1 mg at 01/14/17 0011  . magnesium hydroxide (MILK OF MAGNESIA) suspension 30 mL  30 mL Oral Daily PRN Lindon Romp A, NP      . meloxicam (MOBIC) tablet 7.5 mg  7.5 mg Oral Daily Withrow, John C, FNP   7.5 mg at 01/13/17 1737  . multivitamin with minerals tablet 1 tablet  1 tablet Oral Daily Lindon Romp A, NP   1 tablet at 01/14/17 [phone]  . nicotine (NICODERM CQ - dosed in mg/24 hours) patch 21 mg  21 mg Transdermal Daily Benjamine Mola, FNP   21 mg at 01/14/17 8101  . ondansetron (ZOFRAN-ODT) disintegrating tablet 4 mg  4 mg Oral Q6H PRN Lindon Romp A, NP      . risperiDONE (RISPERDAL) tablet 0.5 mg  0.5 mg Oral BID Benjamine Mola, FNP   0.5 mg at 01/14/17 7510  . thiamine (VITAMIN B-1) tablet 100 mg  100 mg Oral Daily Lindon Romp A, NP   100 mg at 01/14/17 2585  . traZODone (DESYREL) tablet 50 mg  50 mg Oral QHS,MR X 1 Lindon Romp A, NP   50 mg at 01/13/17 2146  . vortioxetine HBr (TRINTELLIX) TABS 5 mg  5 mg Oral QHS Cobos, Myer Peer, MD   5 mg at 01/13/17 2146    Lab Results:  No results found for this or any previous visit (from the past 67 hour(s)).  Blood Alcohol level:  Lab Results  Component Value Date   ETH <5 27/78/2423    Metabolic Disorder Labs: Lab Results  Component Value Date   HGBA1C 6.2 (H) 01/12/2017   MPG 131 01/12/2017   No results found for: PROLACTIN Lab Results  Component Value Date   CHOL 205 (H) 01/12/2017   TRIG 70 01/12/2017   HDL 61 01/12/2017   CHOLHDL 3.4 01/12/2017   VLDL 14 01/12/2017   LDLCALC 130 (H) 01/12/2017    Physical Findings: AIMS: Facial and Oral Movements Muscles of Facial Expression: None, normal Lips and Perioral Area: None, normal Jaw: None, normal Tongue: None, normal,Extremity  Movements Upper (arms, wrists, hands, fingers): None, normal Lower (legs, knees, ankles, toes): None, normal, Trunk Movements Neck, shoulders, hips: None, normal, Overall Severity Severity  of abnormal movements (highest score from questions above): None, normal Incapacitation due to abnormal movements: None, normal Patient's awareness of abnormal movements (rate only patient's report): No Awareness, Dental Status Current problems with teeth and/or dentures?: Yes Does patient usually wear dentures?: Yes  CIWA:  CIWA-Ar Total: 0 COWS:     Musculoskeletal: Strength & Muscle Tone: within normal limits Gait & Station: normal Patient leans: N/A  Psychiatric Specialty Exam: Physical Exam  Nursing note and vitals reviewed.   Review of Systems  Musculoskeletal: Positive for back pain.  Psychiatric/Behavioral: Positive for depression, hallucinations (improving), substance abuse and suicidal ideas (improving). The patient is nervous/anxious and has insomnia.   All other systems reviewed and are negative.   Blood pressure (!) 151/98, pulse 94, temperature 97.9 F (36.6 C), temperature source Oral, resp. rate 18, height 5\' 5"  (1.651 m), weight 91.6 kg (202 lb).Body mass index is 33.61 kg/m.  General Appearance: Casual and Fairly Groomed  Eye Contact:  Good  Speech:  Clear and Coherent and Normal Rate  Volume:  Normal  Mood:  Anxious and Depressed  Affect:  Appropriate, Congruent and Depressed  Thought Process:  Coherent, Goal Directed, Linear and Descriptions of Associations: Loose  Orientation:  Full (Time, Place, and Person)  Thought Content:  Focused on treatment plans, back pain  Suicidal Thoughts:  Yes.  without intent/plan  Homicidal Thoughts:  No  Memory:  Immediate;   Fair Recent;   Fair Remote;   Fair  Judgement:  Fair  Insight:  Fair  Psychomotor Activity:  Normal  Concentration:  Concentration: Fair and Attention Span: Fair  Recall:  [employer] of Knowledge:  Fair   Language:  Fair  Akathisia:  No  Handed:    AIMS (if indicated):     Assets:  Communication Skills Desire for Improvement Resilience Social Support  ADL's:  Intact  Cognition:  WNL  Sleep:  Number of Hours: 5   Treatment Plan Summary: Substance induced mood disorder ([place]) with MDD, recurrent, severe, with psychosis, treated as below:  On 01/14/2017, I have reviewed medications below and concur with regimen with the following changes:   Medications: -Vistaril 25mg  po q6h prn anxiety -Ativan CIWA (prn only, not full protocol, minimal risk at this time) -Neurontin 400mg  po tid for chronic pain and anxiety -Mobic 7.5mg  po daily for chronic back/ankle pain (jumped from bridge, chronic injuries; walks with limp) -Risperidone 0.5mg  po bid for racing thoughts and psychotic features -Trazodone 50mg  po qhs prn insomnia - DC'd  -Continue home Trintellix 5mg  po qhs for depression (pt states this works well for him) -Start Lidoderm 5% patch for lower back pain  Labs/tests/other: (reviewed on 01/12/17) -Reviewed UDS and + cocaine/opiates -Reviewed CBC, CMP (low K+ 3.2 but was treated in ED), lipid panel mildly elevated but could be controlled with diet (borderline high) -Reviewed A1C 6.2H, TSH 2.755 -Reviewed EKG, QTC [place], [place] 01/14/2017, 3:26 PM

## 2017-01-14 NOTE — Progress Notes (Signed)
Recreation Therapy Notes  Date: 01/14/17 Time: 0930 Location: [address] Dayroom  Group Topic: Stress Management  Goal Area(s) Addresses:  Patient will verbalize importance of using healthy stress management.  Patient will identify positive emotions associated with healthy stress management.   Intervention: Stress Management  Activity :  De-escalating Stress.  LRT introduced the stress management technique of meditation.  LRT played a meditation from the Calm app to help patients engage in the practice of de-escalating stress.  Patients were to follow along as the meditation played to participate in the technique.  Education:  Stress Management, Discharge Planning.   Education Outcome: Acknowledges edcuation/In group clarification offered/Needs additional education  Clinical Observations/Feedback: Pt did not attend group.    Victorino Sparrow, LRT/CTRS         Victorino Sparrow A 01/14/2017 11:33 AM

## 2017-01-14 NOTE — Progress Notes (Signed)
Adult Psychoeducational Group Note  Date:  01/14/2017 Time:  9:09 PM  Group Topic/Focus:  Wrap-Up Group:   The focus of this group is to help patients review their daily goal of treatment and discuss progress on daily workbooks.  Participation Level:  Active  Participation Quality:  Appropriate  Affect:  Appropriate  Cognitive:  Appropriate  Insight: Appropriate  Engagement in Group:  Engaged  Modes of Intervention:  Discussion  Additional Comments:  The patient expressed that he attended the stress group.the patient also said that he rates today a 8.  [name] 01/14/2017, 9:09 PM

## 2017-01-14 NOTE — Progress Notes (Signed)
  D: Pt presents with animated affect and anxious mood. Pt fidgety, impulsive and hyper-verbal this morning. Pt rates depression 6/10. Anxiety 8/10. Pt endorses passive suicidal thoughts. No active thoughts verbalized this morning. Pt last endorsed active suicidal thoughts last night at bedtime. Pt verbally contracts for safety. Pt endorses AH telling him to hurt himself. No withdrawal symptoms reported. Pt seeking a 28 day substance abuse tx facility. A: Medications reviewed with pt. Medications administered as ordered per MD. Verbal support provided. Pt encouraged to attend groups. 15 minute checks performed for safety.  R: Pt compliant with tx.

## 2017-01-14 NOTE — Progress Notes (Signed)
Per pt request, CSW made a referral for residential treatment to [facility] and [facility]. Pt has a [facility] screening date on 5/9 at 7:45AM. Pt will need a 2 week supply of meds and 30 day scripts.   Georga Kaufmann, MSW, [place]

## 2017-01-14 NOTE — [facility] Group Notes (Signed)
[facility] LCSW Aftercare Discharge Planning Group Note   01/14/2017  8:45 AM  Participation Quality: Pt invited. Did not attend.  Georga Kaufmann, MSW, LCSWA 01/14/2017 1:13 PM

## 2017-01-15 MED ORDER — NITROGLYCERIN 0.4 MG SL SUBL
SUBLINGUAL_TABLET | SUBLINGUAL | Status: AC
  Filled 2017-01-15: qty 1

## 2017-01-15 MED ORDER — LISINOPRIL 10 MG PO TABS
10.0000 mg | ORAL_TABLET | Freq: Every day | ORAL | Status: DC
  Administered 2017-01-15: 10 mg via ORAL
  Filled 2017-01-15 (×2): qty 1
  Filled 2017-01-15: qty 2

## 2017-01-15 MED ORDER — HYDROCHLOROTHIAZIDE 12.5 MG PO CAPS
12.5000 mg | ORAL_CAPSULE | Freq: Every day | ORAL | Status: DC
  Filled 2017-01-15: qty 1

## 2017-01-15 MED ORDER — HYDROXYZINE HCL 50 MG PO TABS: 50.0000 mg | ORAL_TABLET | Freq: Four times a day (QID) | ORAL | 0 refills | Status: DC | PRN

## 2017-01-15 MED ORDER — RISPERIDONE 0.5 MG PO TABS: 0.5000 mg | ORAL_TABLET | Freq: Two times a day (BID) | ORAL | 0 refills | Status: DC

## 2017-01-15 MED ORDER — NITROGLYCERIN 0.4 MG SL SUBL
0.4000 mg | SUBLINGUAL_TABLET | SUBLINGUAL | Status: DC | PRN
  Administered 2017-01-15 (×2): 0.4 mg via SUBLINGUAL

## 2017-01-15 MED ORDER — HYDROXYZINE HCL 50 MG PO TABS
ORAL_TABLET | ORAL | Status: AC
  Administered 2017-01-15: 50 mg
  Filled 2017-01-15: qty 1

## 2017-01-15 MED ORDER — LIDOCAINE 5 % EX PTCH
1.0000 | MEDICATED_PATCH | Freq: Every day | CUTANEOUS | Status: DC
  Administered 2017-01-15: 1 via TRANSDERMAL
  Filled 2017-01-15 (×2): qty 1

## 2017-01-15 MED ORDER — DICLOFENAC SODIUM 1 % TD GEL: 2.0000 g | Freq: Four times a day (QID) | TRANSDERMAL | 0 refills | Status: DC

## 2017-01-15 MED ORDER — HYDROCHLOROTHIAZIDE 12.5 MG PO TABS: 12.5000 mg | ORAL_TABLET | Freq: Every day | ORAL | 1 refills | Status: DC

## 2017-01-15 MED ORDER — GABAPENTIN 400 MG PO CAPS: 400.0000 mg | ORAL_CAPSULE | Freq: Three times a day (TID) | ORAL | 0 refills | Status: DC

## 2017-01-15 MED ORDER — NICOTINE 21 MG/24HR TD PT24: 21.0000 mg | MEDICATED_PATCH | Freq: Every day | TRANSDERMAL | 0 refills | Status: DC

## 2017-01-15 MED ORDER — MELOXICAM 7.5 MG PO TABS: 7.5000 mg | ORAL_TABLET | Freq: Every day | ORAL | 0 refills | Status: DC

## 2017-01-15 MED ORDER — LIDOCAINE 5 % EX PTCH: 1.0000 | MEDICATED_PATCH | Freq: Every day | CUTANEOUS | 0 refills | Status: DC

## 2017-01-15 MED ORDER — LISINOPRIL 10 MG PO TABS: 10.0000 mg | ORAL_TABLET | Freq: Every day | ORAL | 0 refills | Status: DC

## 2017-01-15 MED ORDER — TRAZODONE HCL 50 MG PO TABS: 50.0000 mg | ORAL_TABLET | Freq: Every evening | ORAL | 0 refills | Status: DC | PRN

## 2017-01-15 MED ORDER — VORTIOXETINE HBR 5 MG PO TABS: 5.0000 mg | ORAL_TABLET | Freq: Every day | ORAL | 0 refills | Status: DC

## 2017-01-15 MED ORDER — HYDROXYZINE HCL 50 MG PO TABS
50.0000 mg | ORAL_TABLET | Freq: Four times a day (QID) | ORAL | Status: DC | PRN
  Filled 2017-01-15: qty 20
  Filled 2017-01-15: qty 1

## 2017-01-15 NOTE — BHH Suicide Risk Assessment (Signed)
[place] INPATIENT:  Family/Significant Other Suicide Prevention Education  Suicide Prevention Education:  Education Completed; [name] (wife [phone]), has been identified by the patient as the family member/significant other with whom the patient will be residing, and identified as the person(s) who will aid the patient in the event of a mental health crisis (suicidal ideations/suicide attempt).  With written consent from the patient, the family member/significant other has been provided the following suicide prevention education, prior to the and/or following the discharge of the patient.  The suicide prevention education provided includes the following:  Suicide risk factors  Suicide prevention and interventions  National Suicide Hotline telephone number  [facility] assessment telephone number  [facility] Emergency Assistance [place] and/or Residential Mobile Crisis Unit telephone number  Request made of family/significant other to:  Remove weapons (e.g., guns, rifles, knives), all items previously/currently identified as safety concern.    Remove drugs/medications (over-the-counter, prescriptions, illicit drugs), all items previously/currently identified as a safety concern.  The family member/significant other verbalizes understanding of the suicide prevention education information provided.  The family member/significant other agrees to remove the items of safety concern listed above.  Pt's wife expressed no concerns about pt's discharge tomorrow. Pt's wife is agreeable to pt discharging to [facility] and will provide transportation. Pt's wife confirms that the pt does not have access to guns or weapons.  Georga Kaufmann, MSW, LCSWA  01/15/2017, 1:43 PM

## 2017-01-15 NOTE — BHH Suicide Risk Assessment (Signed)
[facility] Discharge Suicide Risk Assessment   Principal Problem: Substance induced mood disorder [facility]) Discharge Diagnoses:  Patient Active Problem List   Diagnosis Date Noted  . MDD (major depressive disorder), recurrent severe, without psychosis ([place]) [F33.2] 01/12/2017  . Polysubstance abuse [F19.10] 01/12/2017  . Substance induced mood disorder ([place]) [F19.94] 01/12/2017    Total Time spent with patient: 45 minutes  Musculoskeletal: Strength & Muscle Tone: within normal limits Gait & Station: normal Patient leans: N/A  Psychiatric Specialty Exam: Review of Systems  Constitutional: Negative.   HENT: Negative.   Eyes: Negative.   Respiratory: Negative.   Cardiovascular: Negative.   Gastrointestinal: Negative.   Genitourinary: Negative.   Musculoskeletal: Negative.   Skin: Negative.   Neurological: Negative.   Endo/Heme/Allergies: Negative.   Psychiatric/Behavioral: Negative.  Negative for depression, hallucinations, memory loss, substance abuse and suicidal ideas. The patient is not nervous/anxious and does not have insomnia.     Blood pressure 134/79, pulse 97, temperature 97.6 F (36.4 C), temperature source Oral, resp. rate 20, height 5\' 5"  (1.651 m), weight 91.6 kg (202 lb).Body mass index is 33.61 kg/m.  General Appearance: Neatly dressed, pleasant, engaging well and cooperative. Appropriate behavior. Not in any distress. Good relatedness. Not internally stimulated  Eye Contact::  Good  Speech:  Spontaneous, normal prosody. Normal tone and rate.   Volume:  Normal  Mood:  Euthymic  Affect:  Appropriate and Full Range  Thought Process:  Goal Directed and Linear  Orientation:  Full (Time, Place, and Person)  Thought Content:  No delusional theme. No preoccupation with violent thoughts. No negative ruminations. No obsession.  No hallucination in any modality.   Suicidal Thoughts:  No  Homicidal Thoughts:  No  Memory:  Immediate;   Good Recent;   Good Remote;   Good   Judgement:  Good  Insight:  Good  Psychomotor Activity:  Normal  Concentration:  Good  Recall:  Good  Fund of Knowledge:Good  Language: Good  Akathisia:  No  Handed:    AIMS (if indicated):     Assets:  Communication Skills Desire for Improvement Housing Intimacy Resilience Social Support Vocational/Educational  Sleep:  Number of Hours: 6.5  Cognition: WNL  ADL's:  Intact   Mental Status Per Nursing Assessment::   51 yo AAM, married, lives with his family. History of SUD. Was suicidal at presentation has detoxed from substances. Tells me today that he is in good spirits. States that use of substances makes his angry and dysphoric. Says now he has come off the substances he feels good. Plans to get into rehab. Says for the first time he is doing it because he wants to do it. Plans to turn his life around. Says he cannot allow addiction to dictate his life anymore. States that he is now able to think clearly. No racing thoughts. No irritability. No thoughts of violence. No anxiety. No evidence of psychosis. No evidence of mania. States that he is tolerating his medications well.  Nursing staff reports that patient has been appropriate on the unit. Patient has been interacting well with peers. No behavioral issues. Patient has not voiced any suicidal thoughts. Patient has not been observed to be internally stimulated. Patient has been adherent with treatment recommendations. Patient has been tolerating their medication well.   Patient was discussed at team. Team members feels that patient is back to his baseline level of function. Team agrees with plan to discharge patient today.  Demographic Factors:  Male  Loss Factors: NA  Historical Factors: Prior suicide attempts and Impulsivity  Risk Reduction Factors:   Responsible for children under 90 years of age, Sense of responsibility to family, Religious beliefs about death, Living with another person, especially a relative,  Positive social support, Positive therapeutic relationship and Positive coping skills or problem solving skills  Continued Clinical Symptoms:  As above   Cognitive Features That Contribute To Risk:  None    Suicide Risk:  Minimal: No identifiable suicidal ideation.  .Patient is not having any thoughts of suicide at this time. Modifiable risk factors targeted during this admission includes depression and substance use. Demographical and historical risk factors cannot be modified. Patient is now engaging well. Patient is reliable and is future oriented. We have buffered patient's support structures. At this point, patient is at low risk of suicide. Patient is aware of the effects of psychoactive substances on decision making process. Patient has been provided with emergency contacts. Patient acknowledges to use resources provided if unforseen circumstances changes their current risk stratification.    Follow-up Information    [facility], [facility]. Go on 01/16/2017.   Why:  Social worker has made a referral on your behalf. Please arrive on this date for a screening appointment no later than 8AM. Be sure to bring a photo ID, 2 week supply of meds, and 30 days of prescriptions. Thank you. Contact information: [place] [address] [phone]           Plan Of Care/Follow-up recommendations:  1. Continue current psychotropic medications 2. Mental health and addiction follow up as arranged.  3. Discharge in care of their family 4. Provided limited quantity of prescriptions   [place], MD 01/15/2017, 5:49 PM

## 2017-01-15 NOTE — BHH Group Notes (Signed)
[facility] Mental Health Association Group Therapy 01/15/2017 1:15pm  Type of Therapy: Mental Health Association Presentation  Participation Level: Active  Participation Quality: Attentive  Affect: Appropriate  Cognitive: Oriented  Insight: Developing/Improving  Engagement in Therapy: Engaged  Modes of Intervention: Discussion, Education and Socialization  Summary of Progress/Problems: Mental Health Association ([place]) Speaker came to talk about his personal journey with substance abuse and addiction. The pt processed ways by which to relate to the speaker. [place] speaker provided handouts and educational information pertaining to groups and services offered by the [facility]. Pt was engaged in speaker's presentation and was receptive to resources provided.    Kara Mead. Marshell Levan, MSW, [place] 01/15/2017 3:01 PM

## 2017-01-15 NOTE — Progress Notes (Signed)
Recreation Therapy Notes  Animal-Assisted Activity (AAA) Program Checklist/Progress Notes Patient Eligibility Criteria Checklist & Daily Group note for Rec TxIntervention  Date: 05.08.2018 Time: 2:45pm Location: [address]   AAA/T Program Assumption of Risk Form signed by Patient/ or Parent Legal Guardian Yes  Patient is free of allergies or sever asthma Yes  Patient reports no fear of animals Yes  Patient reports no history of cruelty to animals Yes  Patient understands his/her participation is voluntary Yes  Patient washes hands before animal contact Yes  Patient washes hands after animal contact Yes  Behavioral Response: Engaged, Attentive   Education:Hand Washing, Appropriate Animal Interaction   Education Outcome: Acknowledges education.   Clinical Observations/Feedback: Patient attended session and interacted appropriately with therapy dog and peers.   Laureen Ochs Devany Aja, LRT/CTRS        Livianna Petraglia L 01/15/2017 3:07 PM

## 2017-01-15 NOTE — BHH Group Notes (Signed)
[place] Group Notes:  (Nursing/MHT/Case Management/Adjunct)  Date:  01/15/2017  Time:  0900 am  Type of Therapy:  Mindfulness Meditation  Participation Level:  Active  Participation Quality:  Appropriate and Attentive  Affect:  Appropriate  Cognitive:  Alert and Appropriate  Insight:  Good  Engagement in Group:  Engaged  Modes of Intervention:  Support  Summary of Progress/Problems: Patient was engaged during the meditation exercise.  Patient appeared to enjoy it and participated in discussion afterward.  [name] 01/15/2017, 12:04 PM

## 2017-01-15 NOTE — Progress Notes (Signed)
[name] had been up and visible in milieu this evening, did attend and participate in evening group activity. [name] spoke of his day and going to earlier groups and spoke about the need to become clean and sober again. [name] spoke about how he is hopeful to go to a treatment facility on Wednesday and the need to complete the program. [name] did receive all bedtime medications without incident this evening. A. Support and encouragement provided. R. Safety maintained, will continue to monitor.

## 2017-01-15 NOTE — Plan of Care (Signed)
Problem: Education: Goal: Verbalization of understanding the information provided will improve Outcome: Progressing Patient verbalizes understanding of education, information provided.  Problem: Medication: Goal: Compliance with prescribed medication regimen will improve Outcome: Progressing Patient is med compliant.

## 2017-01-15 NOTE — Progress Notes (Signed)
  [facility] Adult Case Management Discharge Plan :  Will you be returning to the same living situation after discharge:  No. Pt discharging to [facility] for treatment. At discharge, do you have transportation home?: Yes,  pt's wife will transport. Do you have the ability to pay for your medications: Yes,  prescriptions and samples provided.  Release of information consent forms completed and in the chart;  Patient's signature needed at discharge.  Patient to Follow up at: Follow-up Information    [facility]. Go on 01/16/2017.   Why:  Social worker has made a referral on your behalf. Please arrive on this date for a screening appointment no later than 8AM. Be sure to bring a photo ID, 2 week supply of meds, and 30 days of prescriptions. Thank you. Contact information: [place] [address] [phone]           Next level of care provider has access to [place] and Suicide Prevention discussed: Yes,  with pt and with pt's wife.  Have you used any form of tobacco in the last 30 days? (Cigarettes, Smokeless Tobacco, Cigars, and/or Pipes): Yes  Has patient been referred to the Quitline?: Patient refused referral  Patient has been referred for addiction treatment: Yes  Georga Kaufmann, MSW, LCSWA  01/15/2017, 3:03 PM

## 2017-01-15 NOTE — Progress Notes (Signed)
D: Patient up and visible in the milieu. Spoke with patient 1:1. Rating depression at a 6/10, hopelessness at a 5/10 and anxiety at a 7/10. Rates sleep as fair, appetite as fair, energy as hyper and concentration as poor. Patient's affect animated, anxious with congruent mood. States goal for today is to "prepare myself for discharge tomorrow, speak to doctor, caseworker and family about program." Continues to report chronic back pain and also complained of stabbing chest pain mid morning. States he has had this before, related to anxiety and cardiac workup was negative. Rated pain at an 8/10.    A: VS obtained (see doc flowsheets), NP made aware and orders received. Medicated per orders, nitrostat x 2 and vistaril prn given. PRN tylenol also given for back pain. Emotional support offered and self inventory reviewed. Encouraged completion of Suicide Safety Plan. Discussed POC with MD, SW.    R: Patient verbalizes understanding of POC. On reassess, patient's pain resolved and CIWA is a 0/10. Patient denies SI/HI and remains safe on level III obs. Aware of discharge plan to [facility] rehab in the AM.

## 2017-01-15 NOTE — Discharge Summary (Signed)
Physician Discharge Summary Note  Patient:  [name] is an 51 y.o., male MRN:  [id number] DOB:  [birth year] Patient phone:  [phone] (home)  Patient address:   [place] [address],  Total Time spent with patient: 30 minutes  Date of Admission:  01/12/2017 Date of Discharge: 01/22/2017  Reason for Admission:  Suicidal ideation Via overdose  Principal Problem: Substance induced mood disorder [facility]) Discharge Diagnoses: Patient Active Problem List   Diagnosis Date Noted  . MDD (major depressive disorder), recurrent severe, without psychosis ([place]) [F33.2] 01/12/2017  . Polysubstance abuse [F19.10] 01/12/2017  . Substance induced mood disorder [facility]) [F19.94] 01/12/2017    Past Psychiatric History: see HPI  Past Medical History:  Past Medical History:  Diagnosis Date  . Lumbago with sciatica    left side  . Vitamin D deficiency     Past Surgical History:  Procedure Laterality Date  . BACK SURGERY     yrs ago   Family History:  Family History  Problem Relation Age of Onset  . Hypothyroidism Mother   . Hypertension Father    Family Psychiatric  History: see HPI Social History:  History  Alcohol Use  . Yes    Comment: 1 pint daily     History  Drug Use  . Types: Cocaine    Social History   Social History  . Marital status: Married    Spouse name: [name]  . Number of children: 4  . Years of education: 12   Occupational History  .      unemployed   Social History Main Topics  . Smoking status: Current Every Day Smoker    Types: Cigarettes  . Smokeless tobacco: Never Used     Comment: 11/02/15 3-4 cigs/day, trying to quit  . Alcohol use Yes     Comment: 1 pint daily  . Drug use: Yes    Types: Cocaine  . Sexual activity: Not Asked   Other Topics Concern  . None   Social History Narrative   Lives at home wife, children   Caffeine use- coffee 2-3 cups     Hospital Course:  [name] an 51  y.o.married male, who was. IVCed by MD, after being brought into [facility] by EMT staff while in transit to [facility] to receive a medical clearance. Patient initially was seen at [facility] due to recent use of Alcohol/Cocaine/Marijuana on 01/10/2017, which resulted in an argument with his wife.  Pt continues to endorse suicidal ideation with plan to OD or jump from bridge, stating he feels hopeless. Pt stated that he heard voices mumbling and telling him to harm himself, worse when high on drugs but still present while not intoxicated.   [name] was admitted for Substance induced mood disorder [facility]) and crisis management.  Patient was treated with medications with their indications listed below in detail under Medication List.  Medical problems were identified and treated as needed.  Home medications were restarted as appropriate.  Improvement was monitored by observation and [name] daily report of symptom reduction.  Emotional and mental status was monitored by daily self inventory reports completed by [name] and clinical staff.  Patient reported continued improvement, denied any new concerns.  Patient had been compliant on medications and denied side effects.  Support and encouragement was provided.    [name] was evaluated by the treatment team for stability and plans for continued recovery upon discharge.  Patient was  offered further treatment options upon discharge including Residential, Intensive Outpatient and Outpatient treatment. Patient will follow up with agency listed below for medication management and counseling.  Encouraged patient to maintain satisfactory support network and home environment.  Advised to adhere to medication compliance and outpatient treatment follow up.  Prescriptions provided.       [name] motivation was an integral factor for scheduling further treatment.  Employment, transportation, bed  availability, health status, family support, and any pending legal issues were also considered during patient's hospital stay.  Upon completion of this admission the patient was both mentally and medically stable for discharge denying suicidal/homicidal ideation, auditory/visual/tactile hallucinations, delusional thoughts and paranoia.      Physical Findings: AIMS: Facial and Oral Movements Muscles of Facial Expression: None, normal Lips and Perioral Area: None, normal Jaw: None, normal Tongue: None, normal,Extremity Movements Upper (arms, wrists, hands, fingers): None, normal Lower (legs, knees, ankles, toes): None, normal, Trunk Movements Neck, shoulders, hips: None, normal, Overall Severity Severity of abnormal movements (highest score from questions above): None, normal Incapacitation due to abnormal movements: None, normal Patient's awareness of abnormal movements (rate only patient's report): No Awareness, Dental Status Current problems with teeth and/or dentures?: Yes Does patient usually wear dentures?: Yes  CIWA:  CIWA-Ar Total: 0 COWS:     Musculoskeletal: Strength & Muscle Tone: within normal limits Gait & Station: normal Patient leans: N/A  Psychiatric Specialty Exam:  See MD SRA Physical Exam  Nursing note and vitals reviewed.   ROS  Blood pressure (!) 152/84, pulse 86, temperature 97.6 F (36.4 C), temperature source Oral, resp. rate 20, height 5\' 5"  (1.651 m), weight 91.6 kg (202 lb).Body mass index is 33.61 kg/m.    Have you used any form of tobacco in the last 30 days? (Cigarettes, Smokeless Tobacco, Cigars, and/or Pipes): Yes  Has this patient used any form of tobacco in the last 30 days? (Cigarettes, Smokeless Tobacco, Cigars, and/or Pipes) Yes, N/A  Blood Alcohol level:  Lab Results  Component Value Date   ETH <5 40/97/3532    Metabolic Disorder Labs:  Lab Results  Component Value Date   HGBA1C 6.2 (H) 01/12/2017   MPG 131 01/12/2017   No results  found for: PROLACTIN Lab Results  Component Value Date   CHOL 205 (H) 01/12/2017   TRIG 70 01/12/2017   HDL 61 01/12/2017   CHOLHDL 3.4 01/12/2017   VLDL 14 01/12/2017   LDLCALC 130 (H) 01/12/2017    See Psychiatric Specialty Exam and Suicide Risk Assessment completed by Attending Physician prior to discharge.  Discharge destination:  Home  Is patient on multiple antipsychotic therapies at discharge:  No   Has Patient had three or more failed trials of antipsychotic monotherapy by history:  No  Recommended Plan for Multiple Antipsychotic Therapies: NA   Allergies as of 01/15/2017   No Known Allergies     Medication List    STOP taking these medications   diclofenac sodium 1 % Gel Commonly known as:  VOLTAREN   hydrochlorothiazide 12.5 MG tablet Commonly known as:  HYDRODIURIL   hydrOXYzine 25 MG capsule Commonly known as:  VISTARIL   ibuprofen 800 MG tablet Commonly known as:  ADVIL,MOTRIN   traMADol 50 MG tablet Commonly known as:  ULTRAM   VITAMIN D PO     TAKE these medications     Indication  gabapentin 400 MG capsule Commonly known as:  NEURONTIN Take 1 capsule (400 mg total) by mouth 3 (three) times  daily.  Indication:  Agitation, Neuropathic Pain   hydrOXYzine 50 MG tablet Commonly known as:  ATARAX/VISTARIL Take 1 tablet (50 mg total) by mouth every 6 (six) hours as needed for anxiety.  Indication:  Anxiety Neurosis   lidocaine 5 % Commonly known as:  LIDODERM Place 1 patch onto the skin daily at 6 PM. Remove & Discard patch within 12 hours or as directed by MD  Indication:  Allodynia   meloxicam 7.5 MG tablet Commonly known as:  MOBIC Take 1 tablet (7.5 mg total) by mouth daily.  Indication:  Joint Damage causing Pain and Loss of Function   nicotine 21 mg/24hr patch Commonly known as:  NICODERM CQ - dosed in mg/24 hours Place 1 patch (21 mg total) onto the skin daily. Start taking on:  01/16/2017  Indication:  Nicotine Addiction    risperiDONE 0.5 MG tablet Commonly known as:  RISPERDAL Take 1 tablet (0.5 mg total) by mouth 2 (two) times daily.  Indication:  Major Depressive Disorder, mood stabilization   traZODone 50 MG tablet Commonly known as:  DESYREL Take 1 tablet (50 mg total) by mouth at bedtime and may repeat dose one time if needed.  Indication:  Trouble Sleeping   vortioxetine HBr 5 MG Tabs Commonly known as:  TRINTELLIX Take 1 tablet (5 mg total) by mouth at bedtime. What changed:  when to take this  Indication:  Major Depressive Disorder      Follow-up Information    [facility]. Go on 01/16/2017.   Why:  Social worker has made a referral on your behalf. Please arrive on this date for a screening appointment no later than 8AM. Be sure to bring a photo ID, 2 week supply of meds, and 30 days of prescriptions. Thank you. Contact information: [place] [address] [phone]           Follow-up recommendations:  Activity:  as tol Diet:  as tol  Comments:  1.  Take all your medications as prescribed.   2.  Report any adverse side effects to outpatient provider. 3.  Patient instructed to not use alcohol or illegal drugs while on prescription medicines. 4.  In the event of worsening symptoms, instructed patient to call 911, the crisis hotline or go to nearest emergency room for evaluation of symptoms.  Signed: Janett Labella, NP [facility] 01/15/2017, 3:08 PM

## 2017-01-16 NOTE — Tx Team (Signed)
Interdisciplinary Treatment and Diagnostic Plan Update 01/16/2017 Time of Session: 9:30am  [name]  MRN: [id number]  Principal Diagnosis: Substance induced mood disorder ([place])  Secondary Diagnoses: Principal Problem:   Substance induced mood disorder ([place]) Active Problems:   MDD (major depressive disorder), recurrent severe, without psychosis ([place])   Polysubstance abuse   Current Medications:  Current Facility-Administered Medications  Medication Dose Route Frequency Provider Last Rate Last Dose  . acetaminophen (TYLENOL) tablet 650 mg  650 mg Oral Q6H PRN Lindon Romp A, NP   650 mg at 01/15/17 0949  . alum & mag hydroxide-simeth (MAALOX/MYLANTA) 200-200-20 MG/5ML suspension 30 mL  30 mL Oral Q4H PRN Rozetta Nunnery, NP      . cholecalciferol (VITAMIN D) tablet 1,000 Units  1,000 Units Oral Daily Lindon Romp A, NP   1,000 Units at 01/15/17 [phone]  . diclofenac sodium (VOLTAREN) 1 % transdermal gel 2 g  2 g Topical QID Benjamine Mola, FNP   2 g at 01/15/17 2107  . gabapentin (NEURONTIN) capsule 400 mg  400 mg Oral TID Lindon Romp A, NP   400 mg at 01/15/17 1659  . hydrOXYzine (ATARAX/VISTARIL) tablet 50 mg  50 mg Oral Q6H PRN Nwoko, Herbert Pun I, NP      . lidocaine (LIDODERM) 5 % 1 patch  1 patch Transdermal q1800 Kerrie Buffalo, NP   1 patch at 01/15/17 2106  . lisinopril (PRINIVIL,ZESTRIL) tablet 10 mg  10 mg Oral Daily Kerrie Buffalo, NP   10 mg at 01/15/17 1659  . magnesium hydroxide (MILK OF MAGNESIA) suspension 30 mL  30 mL Oral Daily PRN Lindon Romp A, NP      . meloxicam (MOBIC) tablet 7.5 mg  7.5 mg Oral Daily Withrow, John C, FNP   7.5 mg at 01/15/17 1659  . multivitamin with minerals tablet 1 tablet  1 tablet Oral Daily Lindon Romp A, NP   1 tablet at 01/15/17 9381  . nicotine (NICODERM CQ - dosed in mg/24 hours) patch 21 mg  21 mg Transdermal Daily Benjamine Mola, FNP   21 mg at 01/15/17 8299  . nitroGLYCERIN (NITROSTAT) SL tablet 0.4 mg  0.4 mg Sublingual Q5  min PRN Lindell Spar I, NP   0.4 mg at 01/15/17 1020  . risperiDONE (RISPERDAL) tablet 0.5 mg  0.5 mg Oral BID Benjamine Mola, FNP   0.5 mg at 01/15/17 1659  . thiamine (VITAMIN B-1) tablet 100 mg  100 mg Oral Daily Lindon Romp A, NP   100 mg at 01/15/17 0808  . traZODone (DESYREL) tablet 50 mg  50 mg Oral QHS,MR X 1 Lindon Romp A, NP   50 mg at 01/15/17 2156  . vortioxetine HBr (TRINTELLIX) TABS 5 mg  5 mg Oral QHS Cobos, Myer Peer, MD   5 mg at 01/15/17 2105   Current Outpatient Prescriptions  Medication Sig Dispense Refill  . diclofenac sodium (VOLTAREN) 1 % GEL Apply 2 g topically 4 (four) times daily. 1 Tube 0  . gabapentin (NEURONTIN) 400 MG capsule Take 1 capsule (400 mg total) by mouth 3 (three) times daily. 90 capsule 0  . hydrochlorothiazide (HYDRODIURIL) 12.5 MG tablet Take 1 tablet (12.5 mg total) by mouth daily. 30 tablet 1  . hydrOXYzine (ATARAX/VISTARIL) 50 MG tablet Take 1 tablet (50 mg total) by mouth every 6 (six) hours as needed for anxiety. 30 tablet 0  . lidocaine (LIDODERM) 5 % Place 1 patch onto the skin daily at 6 PM. Remove &  Discard patch within 12 hours or as directed by MD 30 patch 0  . lisinopril (PRINIVIL,ZESTRIL) 10 MG tablet Take 1 tablet (10 mg total) by mouth daily. 30 tablet 0  . meloxicam (MOBIC) 7.5 MG tablet Take 1 tablet (7.5 mg total) by mouth daily. 30 tablet 0  . nicotine (NICODERM CQ - DOSED IN MG/24 HOURS) 21 mg/24hr patch Place 1 patch (21 mg total) onto the skin daily. 28 patch 0  . risperiDONE (RISPERDAL) 0.5 MG tablet Take 1 tablet (0.5 mg total) by mouth 2 (two) times daily. 60 tablet 0  . traZODone (DESYREL) 50 MG tablet Take 1 tablet (50 mg total) by mouth at bedtime and may repeat dose one time if needed. 30 tablet 0  . vortioxetine HBr (TRINTELLIX) 5 MG TABS Take 1 tablet (5 mg total) by mouth at bedtime. 30 tablet 0    PTA Medications: No prescriptions prior to admission.    Treatment Modalities: Medication Management, Group therapy,  Case management,  1 to 1 session with clinician, Psychoeducation, Recreational therapy.  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 Supportive family/friends  Physician Treatment Plan for Primary Diagnosis: Substance induced mood disorder ([place]) Long Term Goal(s): Improvement in symptoms so as ready for discharge Short Term Goals: Ability to identify changes in lifestyle to reduce recurrence of condition will improve Ability to verbalize feelings will improve Ability to disclose and discuss suicidal ideas Ability to demonstrate self-control will improve Ability to identify and develop effective coping behaviors will improve Ability to maintain clinical measurements within normal limits will improve Compliance with prescribed medications will improve Ability to identify triggers associated with substance abuse/mental health issues will improve Ability to identify changes in lifestyle to reduce recurrence of condition will improve Ability to verbalize feelings will improve Ability to disclose and discuss suicidal ideas Ability to demonstrate self-control will improve Ability to identify and develop effective coping behaviors will improve Ability to maintain clinical measurements within normal limits will improve Compliance with prescribed medications will improve Ability to identify triggers associated with substance abuse/mental health issues will improve  Medication Management: Evaluate patient's response, side effects, and tolerance of medication regimen.  Therapeutic Interventions: 1 to 1 sessions, Unit Group sessions and Medication administration.  Evaluation of Outcomes: Adequate for Discharge  Physician Treatment Plan for Secondary Diagnosis: Principal Problem:   Substance induced mood disorder ([place]) Active Problems:   MDD  (major depressive disorder), recurrent severe, without psychosis ([place])   Polysubstance abuse  Long Term Goal(s): Improvement in symptoms so as ready for discharge  Short Term Goals: Ability to identify changes in lifestyle to reduce recurrence of condition will improve Ability to verbalize feelings will improve Ability to disclose and discuss suicidal ideas Ability to demonstrate self-control will improve Ability to identify and develop effective coping behaviors will improve Ability to maintain clinical measurements within normal limits will improve Compliance with prescribed medications will improve Ability to identify triggers associated with substance abuse/mental health issues will improve Ability to identify changes in lifestyle to reduce recurrence of condition will improve Ability to verbalize feelings will improve Ability to disclose and discuss suicidal ideas Ability to demonstrate self-control will improve Ability to identify and develop effective coping behaviors will improve Ability to maintain clinical measurements within normal limits will improve Compliance with prescribed medications will improve Ability to identify triggers associated with substance abuse/mental health issues will improve  Medication Management: Evaluate patient's  response, side effects, and tolerance of medication regimen.  Therapeutic Interventions: 1 to 1 sessions, Unit Group sessions and Medication administration.  Evaluation of Outcomes: Adequate for Discharge  RN Treatment Plan for Primary Diagnosis: Substance induced mood disorder ([place]) Long Term Goal(s): Knowledge of disease and therapeutic regimen to maintain health will improve  Short Term Goals: Ability to remain free from injury will improve and Compliance with prescribed medications will improve  Medication Management: RN will administer medications as ordered by provider, will assess and evaluate patient's response and provide  education to patient for prescribed medication. RN will report any adverse and/or side effects to prescribing provider.  Therapeutic Interventions: 1 on 1 counseling sessions, Psychoeducation, Medication administration, Evaluate responses to treatment, Monitor vital signs and CBGs as ordered, Perform/monitor CIWA, COWS, AIMS and Fall Risk screenings as ordered, Perform wound care treatments as ordered.  Evaluation of Outcomes: Adequate for Discharge  LCSW Treatment Plan for Primary Diagnosis: Substance induced mood disorder ([place]) Long Term Goal(s): Safe transition to appropriate next level of care at discharge, Engage patient in therapeutic group addressing interpersonal concerns. Short Term Goals: Engage patient in aftercare planning with referrals and resources, Facilitate patient progression through stages of change regarding substance use diagnoses and concerns, Identify triggers associated with mental health/substance abuse issues and Increase skills for wellness and recovery  Therapeutic Interventions: Assess for all discharge needs, 1 to 1 time with Social worker, Explore available resources and support systems, Assess for adequacy in community support network, Educate family and significant other(s) on suicide prevention, Complete Psychosocial Assessment, Interpersonal group therapy.  Evaluation of Outcomes: Adequate for Discharge  Progress in Treatment: Attending groups: Yes  Participating in groups: Yes Taking medication as prescribed: Yes, MD continues to assess for medication changes as needed Toleration medication: Yes, no side effects reported at this time Family/Significant other contact made: Yes, pt's wife contacted. Patient understands diagnosis: Yes, AEB pt's willingness to participate in treatment. Discussing patient identified problems/goals with staff: Yes Medical problems stabilized or resolved: Yes Denies suicidal/homicidal ideation: Yes Issues/concerns per patient  self-inventory: None Other: N/A  New problem(s) identified: None identified at this time.   New Short Term/Long Term Goal(s): None identified at this time.   Discharge Plan or Barriers: Pt will discharge to residential treatment. [facility] referral and [facility] referral made 5/7.  01/16/17: Pt discharged to [facility] for treatment.  Reason for Continuation of Hospitalization:  None identified at this time.  Estimated Length of Stay: 0 days  Attendees: Patient: 01/16/2017 8:38 AM  Physician: Dr. Parke Poisson 01/16/2017 8:38 AM  Nursing: Chrys Racer RN; [place], RN 01/16/2017 8:38 AM  RN Care Manager: Lars Pinks, RN 01/16/2017 8:38 AM  Social Worker: Matthew Saras, [place] 01/16/2017 8:38 AM  Recreational Therapist:  01/16/2017 8:38 AM  Other: Lindell Spar, NP; Samuel Jester, NP 01/16/2017 8:38 AM  Other:  01/16/2017 8:38 AM  Other: 01/16/2017 8:38 AM   Scribe for Treatment Team: Georga Kaufmann, MSW,LCSWA 01/16/2017 8:38 AM

## 2017-01-16 NOTE — Progress Notes (Signed)
Patient ID: [name], male   DOB: [birth year], 51 y.o.   MRN: [id number] [name] was discharged to the lobby at this time in anticipation of his wife picking him up for further treatment at [facility]. He denies SI/HI, he was provided with supply of medications and prescriptions. Nicotine patch and lidocaine patch were removed prior to discharge. Discharge instructions were provided and [name] verbalized understanding. All belongings were returned.

## 2017-01-16 NOTE — Progress Notes (Signed)
[name] had been up and visible in milieu, did attend and participate in evening group activity. Torion has appeared in good spirits and spoke about getting discharged in the morning and going to [facility] and wanting to live clean and sober again. Keenan was able to receive all bedtime medications without incident this evening. A. Support and encouragement provided. R. Safety maintained, will continue to monitor.

## 2017-05-02 DIAGNOSIS — S7221XA Displaced subtrochanteric fracture of right femur, initial encounter for closed fracture: Secondary | ICD-10-CM | POA: Diagnosis not present

## 2017-05-02 DIAGNOSIS — S81012A Laceration without foreign body, left knee, initial encounter: Secondary | ICD-10-CM | POA: Diagnosis not present

## 2017-05-02 DIAGNOSIS — S82302C Unspecified fracture of lower end of left tibia, initial encounter for open fracture type IIIA, IIIB, or IIIC: Secondary | ICD-10-CM | POA: Diagnosis not present

## 2017-05-02 DIAGNOSIS — S82832A Other fracture of upper and lower end of left fibula, initial encounter for closed fracture: Secondary | ICD-10-CM | POA: Diagnosis not present

## 2017-05-03 ENCOUNTER — Inpatient Hospital Stay (HOSPITAL_COMMUNITY): Payer: Medicaid Other

## 2017-05-03 ENCOUNTER — Encounter (HOSPITAL_COMMUNITY): Payer: Self-pay

## 2017-05-03 ENCOUNTER — Inpatient Hospital Stay (HOSPITAL_COMMUNITY): Payer: Medicaid Other | Admitting: Anesthesiology

## 2017-05-03 ENCOUNTER — Inpatient Hospital Stay (HOSPITAL_COMMUNITY)
Admission: EM | Admit: 2017-05-03 | Discharge: 2017-05-21 | DRG: 003 | Disposition: A | Discharge status: Rehabilitation Facility | Payer: Medicaid Other | Attending: General Surgery | Admitting: General Surgery

## 2017-05-03 ENCOUNTER — Emergency Department (HOSPITAL_COMMUNITY): Payer: Medicaid Other

## 2017-05-03 ENCOUNTER — Encounter (HOSPITAL_COMMUNITY): Admission: EM | Disposition: A | Discharge status: Rehabilitation Facility | Payer: Self-pay | Source: Home / Self Care

## 2017-05-03 DIAGNOSIS — S02652A Fracture of angle of left mandible, initial encounter for closed fracture: Secondary | ICD-10-CM | POA: Diagnosis present

## 2017-05-03 DIAGNOSIS — S82252C Displaced comminuted fracture of shaft of left tibia, initial encounter for open fracture type IIIA, IIIB, or IIIC: Secondary | ICD-10-CM | POA: Diagnosis present

## 2017-05-03 DIAGNOSIS — R6510 Systemic inflammatory response syndrome (SIRS) of non-infectious origin without acute organ dysfunction: Secondary | ICD-10-CM

## 2017-05-03 DIAGNOSIS — R4182 Altered mental status, unspecified: Secondary | ICD-10-CM | POA: Diagnosis present

## 2017-05-03 DIAGNOSIS — S92221A Displaced fracture of lateral cuneiform of right foot, initial encounter for closed fracture: Secondary | ICD-10-CM | POA: Diagnosis present

## 2017-05-03 DIAGNOSIS — S2243XA Multiple fractures of ribs, bilateral, initial encounter for closed fracture: Secondary | ICD-10-CM | POA: Diagnosis present

## 2017-05-03 DIAGNOSIS — D62 Acute posthemorrhagic anemia: Secondary | ICD-10-CM

## 2017-05-03 DIAGNOSIS — Z8249 Family history of ischemic heart disease and other diseases of the circulatory system: Secondary | ICD-10-CM

## 2017-05-03 DIAGNOSIS — S81012A Laceration without foreign body, left knee, initial encounter: Secondary | ICD-10-CM | POA: Diagnosis present

## 2017-05-03 DIAGNOSIS — S14111A Complete lesion at C1 level of cervical spinal cord, initial encounter: Secondary | ICD-10-CM | POA: Diagnosis present

## 2017-05-03 DIAGNOSIS — H4902 Third [oculomotor] nerve palsy, left eye: Secondary | ICD-10-CM | POA: Diagnosis present

## 2017-05-03 DIAGNOSIS — S01512A Laceration without foreign body of oral cavity, initial encounter: Secondary | ICD-10-CM | POA: Diagnosis present

## 2017-05-03 DIAGNOSIS — R402342 Coma scale, best motor response, flexion withdrawal, at arrival to emergency department: Secondary | ICD-10-CM | POA: Diagnosis present

## 2017-05-03 DIAGNOSIS — S2239XA Fracture of one rib, unspecified side, initial encounter for closed fracture: Secondary | ICD-10-CM

## 2017-05-03 DIAGNOSIS — D72829 Elevated white blood cell count, unspecified: Secondary | ICD-10-CM

## 2017-05-03 DIAGNOSIS — Z931 Gastrostomy status: Secondary | ICD-10-CM

## 2017-05-03 DIAGNOSIS — S27321A Contusion of lung, unilateral, initial encounter: Secondary | ICD-10-CM | POA: Diagnosis present

## 2017-05-03 DIAGNOSIS — R402132 Coma scale, eyes open, to sound, at arrival to emergency department: Secondary | ICD-10-CM | POA: Diagnosis present

## 2017-05-03 DIAGNOSIS — E559 Vitamin D deficiency, unspecified: Secondary | ICD-10-CM | POA: Diagnosis present

## 2017-05-03 DIAGNOSIS — R402212 Coma scale, best verbal response, none, at arrival to emergency department: Secondary | ICD-10-CM | POA: Diagnosis present

## 2017-05-03 DIAGNOSIS — L03211 Cellulitis of face: Secondary | ICD-10-CM | POA: Diagnosis not present

## 2017-05-03 DIAGNOSIS — S92334A Nondisplaced fracture of third metatarsal bone, right foot, initial encounter for closed fracture: Secondary | ICD-10-CM | POA: Diagnosis present

## 2017-05-03 DIAGNOSIS — S066X9A Traumatic subarachnoid hemorrhage with loss of consciousness of unspecified duration, initial encounter: Secondary | ICD-10-CM | POA: Diagnosis present

## 2017-05-03 DIAGNOSIS — S92241A Displaced fracture of medial cuneiform of right foot, initial encounter for closed fracture: Secondary | ICD-10-CM | POA: Diagnosis present

## 2017-05-03 DIAGNOSIS — S8292XB Unspecified fracture of left lower leg, initial encounter for open fracture type I or II: Secondary | ICD-10-CM

## 2017-05-03 DIAGNOSIS — S92324A Nondisplaced fracture of second metatarsal bone, right foot, initial encounter for closed fracture: Secondary | ICD-10-CM | POA: Diagnosis present

## 2017-05-03 DIAGNOSIS — L03213 Periorbital cellulitis: Secondary | ICD-10-CM | POA: Diagnosis not present

## 2017-05-03 DIAGNOSIS — M532X1 Spinal instabilities, occipito-atlanto-axial region: Secondary | ICD-10-CM | POA: Diagnosis present

## 2017-05-03 DIAGNOSIS — M24831 Other specific joint derangements of right wrist, not elsewhere classified: Secondary | ICD-10-CM

## 2017-05-03 DIAGNOSIS — S069X9A Unspecified intracranial injury with loss of consciousness of unspecified duration, initial encounter: Secondary | ICD-10-CM | POA: Diagnosis present

## 2017-05-03 DIAGNOSIS — J9601 Acute respiratory failure with hypoxia: Secondary | ICD-10-CM | POA: Diagnosis present

## 2017-05-03 DIAGNOSIS — S36115A Moderate laceration of liver, initial encounter: Secondary | ICD-10-CM | POA: Diagnosis present

## 2017-05-03 DIAGNOSIS — J9690 Respiratory failure, unspecified, unspecified whether with hypoxia or hypercapnia: Secondary | ICD-10-CM

## 2017-05-03 DIAGNOSIS — S82452A Displaced comminuted fracture of shaft of left fibula, initial encounter for closed fracture: Secondary | ICD-10-CM | POA: Diagnosis present

## 2017-05-03 DIAGNOSIS — Z23 Encounter for immunization: Secondary | ICD-10-CM | POA: Diagnosis not present

## 2017-05-03 DIAGNOSIS — S82872A Displaced pilon fracture of left tibia, initial encounter for closed fracture: Secondary | ICD-10-CM | POA: Diagnosis present

## 2017-05-03 DIAGNOSIS — S7222XA Displaced subtrochanteric fracture of left femur, initial encounter for closed fracture: Secondary | ICD-10-CM | POA: Diagnosis present

## 2017-05-03 DIAGNOSIS — J189 Pneumonia, unspecified organism: Secondary | ICD-10-CM | POA: Diagnosis not present

## 2017-05-03 DIAGNOSIS — S069XAA Unspecified intracranial injury with loss of consciousness status unknown, initial encounter: Secondary | ICD-10-CM | POA: Diagnosis present

## 2017-05-03 DIAGNOSIS — G8250 Quadriplegia, unspecified: Secondary | ICD-10-CM | POA: Diagnosis present

## 2017-05-03 DIAGNOSIS — T1490XA Injury, unspecified, initial encounter: Secondary | ICD-10-CM

## 2017-05-03 DIAGNOSIS — F17210 Nicotine dependence, cigarettes, uncomplicated: Secondary | ICD-10-CM | POA: Diagnosis present

## 2017-05-03 DIAGNOSIS — S92112A Displaced fracture of neck of left talus, initial encounter for closed fracture: Secondary | ICD-10-CM | POA: Diagnosis present

## 2017-05-03 DIAGNOSIS — T148XXA Other injury of unspecified body region, initial encounter: Secondary | ICD-10-CM

## 2017-05-03 DIAGNOSIS — R000 Tachycardia, unspecified: Secondary | ICD-10-CM

## 2017-05-03 DIAGNOSIS — F1910 Other psychoactive substance abuse, uncomplicated: Secondary | ICD-10-CM

## 2017-05-03 DIAGNOSIS — R509 Fever, unspecified: Secondary | ICD-10-CM

## 2017-05-03 DIAGNOSIS — Y95 Nosocomial condition: Secondary | ICD-10-CM | POA: Diagnosis not present

## 2017-05-03 DIAGNOSIS — S7292XA Unspecified fracture of left femur, initial encounter for closed fracture: Secondary | ICD-10-CM

## 2017-05-03 DIAGNOSIS — S82209A Unspecified fracture of shaft of unspecified tibia, initial encounter for closed fracture: Secondary | ICD-10-CM

## 2017-05-03 DIAGNOSIS — Z419 Encounter for procedure for purposes other than remedying health state, unspecified: Secondary | ICD-10-CM

## 2017-05-03 DIAGNOSIS — H4923 Sixth [abducent] nerve palsy, bilateral: Secondary | ICD-10-CM | POA: Diagnosis present

## 2017-05-03 DIAGNOSIS — M25521 Pain in right elbow: Secondary | ICD-10-CM

## 2017-05-03 DIAGNOSIS — G8918 Other acute postprocedural pain: Secondary | ICD-10-CM

## 2017-05-03 DIAGNOSIS — M24832 Other specific joint derangements of left wrist, not elsewhere classified: Secondary | ICD-10-CM

## 2017-05-03 HISTORY — DX: Dorsalgia, unspecified: M54.9

## 2017-05-03 HISTORY — PX: EXTERNAL FIXATION LEG: SHX1549

## 2017-05-03 HISTORY — PX: INCISION AND DRAINAGE OF WOUND: SHX1803

## 2017-05-03 HISTORY — DX: Other chronic pain: G89.29

## 2017-05-03 HISTORY — PX: FEMUR IM NAIL: SHX1597

## 2017-05-03 HISTORY — DX: Personal history of other mental and behavioral disorders: Z86.59

## 2017-05-03 HISTORY — PX: I&D EXTREMITY: SHX5045

## 2017-05-03 LAB — I-STAT ARTERIAL BLOOD GAS, ED
Acid-base deficit: 4 mmol/L — ABNORMAL HIGH (ref 0.0–2.0)
Bicarbonate: 20.9 mmol/L (ref 20.0–28.0)
O2 Saturation: 100 %
PCO2 ART: 36.5 mmHg (ref 32.0–48.0)
TCO2: 22 mmol/L (ref 22–32)
pH, Arterial: 7.363 (ref 7.350–7.450)
pO2, Arterial: 210 mmHg — ABNORMAL HIGH (ref 83.0–108.0)

## 2017-05-03 LAB — CBC
HCT: 41 % (ref 39.0–52.0)
HEMATOCRIT: 30.9 % — AB (ref 39.0–52.0)
Hemoglobin: 13.8 g/dL (ref 13.0–17.0)
Hemoglobin: 10.2 g/dL — ABNORMAL LOW (ref 13.0–17.0)
MCH: 27.4 pg (ref 26.0–34.0)
MCH: 27.6 pg (ref 26.0–34.0)
MCHC: 33.7 g/dL (ref 30.0–36.0)
MCHC: 33 g/dL (ref 30.0–36.0)
MCV: 81.3 fL (ref 78.0–100.0)
MCV: 83.5 fL (ref 78.0–100.0)
PLATELETS: 260 10*3/uL (ref 150–400)
PLATELETS: 146 10*3/uL — AB (ref 150–400)
RBC: 5.04 MIL/uL (ref 4.22–5.81)
RBC: 3.7 MIL/uL — ABNORMAL LOW (ref 4.22–5.81)
RDW: 14.9 % (ref 11.5–15.5)
RDW: 15.4 % (ref 11.5–15.5)
WBC: 16.3 10*3/uL — AB (ref 4.0–10.5)
WBC: 7.5 10*3/uL (ref 4.0–10.5)

## 2017-05-03 LAB — URINALYSIS, ROUTINE W REFLEX MICROSCOPIC
BILIRUBIN URINE: NEGATIVE
Bacteria, UA: NONE SEEN
GLUCOSE, UA: NEGATIVE mg/dL
Ketones, ur: 5 mg/dL — AB
LEUKOCYTES UA: NEGATIVE
NITRITE: NEGATIVE
PH: 5 (ref 5.0–8.0)
Protein, ur: NEGATIVE mg/dL
SPECIFIC GRAVITY, URINE: 1.033 — AB (ref 1.005–1.030)
SQUAMOUS EPITHELIAL / LPF: NONE SEEN

## 2017-05-03 LAB — I-STAT CHEM 8, ED
BUN: 13 mg/dL (ref 6–20)
CALCIUM ION: 1.08 mmol/L — AB (ref 1.15–1.40)
CHLORIDE: 106 mmol/L (ref 101–111)
CREATININE: 1 mg/dL (ref 0.61–1.24)
GLUCOSE: 184 mg/dL — AB (ref 65–99)
HCT: 43 % (ref 39.0–52.0)
Hemoglobin: 14.6 g/dL (ref 13.0–17.0)
POTASSIUM: 4.2 mmol/L (ref 3.5–5.1)
Sodium: 138 mmol/L (ref 135–145)
TCO2: 23 mmol/L (ref 22–32)

## 2017-05-03 LAB — COMPREHENSIVE METABOLIC PANEL
ALK PHOS: 64 U/L (ref 38–126)
ALT: 199 U/L — AB (ref 17–63)
ANION GAP: 14 (ref 5–15)
AST: 234 U/L — ABNORMAL HIGH (ref 15–41)
Albumin: 3.5 g/dL (ref 3.5–5.0)
BUN: 12 mg/dL (ref 6–20)
CALCIUM: 8.8 mg/dL — AB (ref 8.9–10.3)
CO2: 17 mmol/L — ABNORMAL LOW (ref 22–32)
CREATININE: 1.03 mg/dL (ref 0.61–1.24)
Chloride: 106 mmol/L (ref 101–111)
Glucose, Bld: 179 mg/dL — ABNORMAL HIGH (ref 65–99)
Potassium: 4.2 mmol/L (ref 3.5–5.1)
Sodium: 137 mmol/L (ref 135–145)
TOTAL PROTEIN: 6.9 g/dL (ref 6.5–8.1)
Total Bilirubin: 0.8 mg/dL (ref 0.3–1.2)

## 2017-05-03 LAB — POCT I-STAT 3, ART BLOOD GAS (G3+)
ACID-BASE DEFICIT: 4 mmol/L — AB (ref 0.0–2.0)
Bicarbonate: 21.6 mmol/L (ref 20.0–28.0)
O2 SAT: 98 %
PCO2 ART: 39.9 mmHg (ref 32.0–48.0)
Patient temperature: 97.7
TCO2: 23 mmol/L (ref 22–32)
pH, Arterial: 7.34 — ABNORMAL LOW (ref 7.350–7.450)
pO2, Arterial: 105 mmHg (ref 83.0–108.0)

## 2017-05-03 LAB — PREPARE FRESH FROZEN PLASMA
UNIT DIVISION: 0
Unit division: 0

## 2017-05-03 LAB — BPAM FFP
BLOOD PRODUCT EXPIRATION DATE: 201808282359
BLOOD PRODUCT EXPIRATION DATE: 201808282359
ISSUE DATE / TIME: 201808240250
ISSUE DATE / TIME: 201808240250
Unit Type and Rh: 600
Unit Type and Rh: 6200

## 2017-05-03 LAB — PROTIME-INR
INR: 1.08
Prothrombin Time: 14.1 seconds (ref 11.4–15.2)

## 2017-05-03 LAB — RAPID URINE DRUG SCREEN, HOSP PERFORMED
Amphetamines: NOT DETECTED
BENZODIAZEPINES: NOT DETECTED
Barbiturates: NOT DETECTED
COCAINE: POSITIVE — AB
OPIATES: NOT DETECTED
TETRAHYDROCANNABINOL: POSITIVE — AB

## 2017-05-03 LAB — ABO/RH: ABO/RH(D): O POS

## 2017-05-03 LAB — I-STAT CG4 LACTIC ACID, ED: Lactic Acid, Venous: 2.67 mmol/L (ref 0.5–1.9)

## 2017-05-03 LAB — MRSA PCR SCREENING: MRSA by PCR: NEGATIVE

## 2017-05-03 LAB — BLOOD PRODUCT ORDER (VERBAL) VERIFICATION

## 2017-05-03 LAB — ETHANOL

## 2017-05-03 LAB — TRIGLYCERIDES: Triglycerides: 50 mg/dL (ref <150)

## 2017-05-03 LAB — CDS SEROLOGY

## 2017-05-03 SURGERY — IRRIGATION AND DEBRIDEMENT EXTREMITY
Anesthesia: General | Site: Leg Lower | Laterality: Right

## 2017-05-03 MED ORDER — FENTANYL CITRATE (PF) 250 MCG/5ML IJ SOLN
INTRAMUSCULAR | Status: DC | PRN
  Administered 2017-05-03 (×2): 100 ug via INTRAVENOUS
  Administered 2017-05-03 (×3): 50 ug via INTRAVENOUS
  Administered 2017-05-03: 100 ug via INTRAVENOUS
  Administered 2017-05-03: 50 ug via INTRAVENOUS

## 2017-05-03 MED ORDER — VECURONIUM BROMIDE 10 MG IV SOLR
INTRAVENOUS | Status: AC | PRN
  Administered 2017-05-03: 10 mg via INTRAVENOUS

## 2017-05-03 MED ORDER — PROPOFOL 1000 MG/100ML IV EMUL
0.0000 ug/kg/min | INTRAVENOUS | Status: DC
  Administered 2017-05-03: 45 ug/kg/min via INTRAVENOUS
  Administered 2017-05-03: 15 ug/kg/min via INTRAVENOUS
  Administered 2017-05-04 – 2017-05-05 (×2): 10 ug/kg/min via INTRAVENOUS
  Administered 2017-05-06: 30 ug/kg/min via INTRAVENOUS
  Administered 2017-05-06 (×2): 10 ug/kg/min via INTRAVENOUS
  Administered 2017-05-07: 20 ug/kg/min via INTRAVENOUS
  Administered 2017-05-07: 10 ug/kg/min via INTRAVENOUS
  Administered 2017-05-08 (×2): 30 ug/kg/min via INTRAVENOUS
  Filled 2017-05-03 (×12): qty 100

## 2017-05-03 MED ORDER — PROPOFOL 1000 MG/100ML IV EMUL
INTRAVENOUS | Status: AC
  Filled 2017-05-03: qty 100

## 2017-05-03 MED ORDER — SODIUM CHLORIDE 0.9 % IV SOLN
INTRAVENOUS | Status: DC
  Administered 2017-05-03 (×2): via INTRAVENOUS

## 2017-05-03 MED ORDER — ONDANSETRON HCL 4 MG/2ML IJ SOLN: 4.0000 mg | Freq: Four times a day (QID) | INTRAMUSCULAR | Status: DC | PRN

## 2017-05-03 MED ORDER — SODIUM CHLORIDE 0.9 % IV BOLUS (SEPSIS)
1000.0000 mL | Freq: Once | INTRAVENOUS | Status: AC
  Administered 2017-05-03: 1000 mL via INTRAVENOUS

## 2017-05-03 MED ORDER — SODIUM CHLORIDE 0.9 % IR SOLN
Status: DC | PRN
  Administered 2017-05-03: 3000 mL

## 2017-05-03 MED ORDER — PANTOPRAZOLE SODIUM 40 MG PO TBEC: 40.0000 mg | DELAYED_RELEASE_TABLET | Freq: Every day | ORAL | Status: DC

## 2017-05-03 MED ORDER — VANCOMYCIN HCL 500 MG IV SOLR
INTRAVENOUS | Status: AC
  Filled 2017-05-03: qty 500

## 2017-05-03 MED ORDER — FENTANYL CITRATE (PF) 100 MCG/2ML IJ SOLN
50.0000 ug | Freq: Once | INTRAMUSCULAR | Status: DC
  Filled 2017-05-03: qty 2

## 2017-05-03 MED ORDER — FENTANYL 2500MCG IN NS 250ML (10MCG/ML) PREMIX INFUSION
25.0000 ug/h | INTRAVENOUS | Status: DC
  Filled 2017-05-03: qty 250

## 2017-05-03 MED ORDER — PNEUMOCOCCAL VAC POLYVALENT 25 MCG/0.5ML IJ INJ: 0.5000 mL | INJECTION | INTRAMUSCULAR | Status: DC | PRN

## 2017-05-03 MED ORDER — SODIUM CHLORIDE 0.9 % IV SOLN
500.0000 mg | Freq: Two times a day (BID) | INTRAVENOUS | Status: AC
  Administered 2017-05-03 – 2017-05-10 (×14): 500 mg via INTRAVENOUS
  Filled 2017-05-03 (×14): qty 5

## 2017-05-03 MED ORDER — FENTANYL 2500MCG IN NS 250ML (10MCG/ML) PREMIX INFUSION
25.0000 ug/h | INTRAVENOUS | Status: DC
  Administered 2017-05-03: 50 ug/h via INTRAVENOUS
  Administered 2017-05-04: 100 ug/h via INTRAVENOUS
  Administered 2017-05-04: 150 ug/h via INTRAVENOUS
  Administered 2017-05-05: 125 ug/h via INTRAVENOUS
  Administered 2017-05-06: 150 ug/h via INTRAVENOUS
  Administered 2017-05-06: 125 ug/h via INTRAVENOUS
  Administered 2017-05-07: 14:00:00 via INTRAVENOUS
  Administered 2017-05-10 – 2017-05-11 (×2): 75 ug/h via INTRAVENOUS
  Administered 2017-05-12: 100 ug/h via INTRAVENOUS
  Administered 2017-05-14 – 2017-05-16 (×2): 50 ug/h via INTRAVENOUS
  Filled 2017-05-03 (×12): qty 250

## 2017-05-03 MED ORDER — ROCURONIUM BROMIDE 100 MG/10ML IV SOLN
INTRAVENOUS | Status: DC | PRN
  Administered 2017-05-03: 60 mg via INTRAVENOUS
  Administered 2017-05-03: 40 mg via INTRAVENOUS

## 2017-05-03 MED ORDER — METOPROLOL TARTRATE 5 MG/5ML IV SOLN: 5.0000 mg | Freq: Four times a day (QID) | INTRAVENOUS | Status: DC | PRN

## 2017-05-03 MED ORDER — TETANUS-DIPHTH-ACELL PERTUSSIS 5-2.5-18.5 LF-MCG/0.5 IM SUSP
0.5000 mL | Freq: Once | INTRAMUSCULAR | Status: AC
Start: 2017-05-03 — End: 2017-05-03
  Administered 2017-05-03: 0.5 mL via INTRAMUSCULAR
  Filled 2017-05-03: qty 0.5

## 2017-05-03 MED ORDER — SUCCINYLCHOLINE CHLORIDE 20 MG/ML IJ SOLN
INTRAMUSCULAR | Status: AC | PRN
  Administered 2017-05-03: 150 mg via INTRAVENOUS

## 2017-05-03 MED ORDER — LACTATED RINGERS IV SOLN
INTRAVENOUS | Status: DC | PRN
  Administered 2017-05-03: 07:00:00 via INTRAVENOUS

## 2017-05-03 MED ORDER — MUPIROCIN CALCIUM 2 % EX CREA
TOPICAL_CREAM | CUTANEOUS | Status: DC | PRN
  Administered 2017-05-03: 1 via TOPICAL

## 2017-05-03 MED ORDER — ONDANSETRON 4 MG PO TBDP
4.0000 mg | ORAL_TABLET | Freq: Four times a day (QID) | ORAL | Status: DC | PRN
  Filled 2017-05-03: qty 1

## 2017-05-03 MED ORDER — IOPAMIDOL (ISOVUE-300) INJECTION 61%
INTRAVENOUS | Status: AC
  Filled 2017-05-03: qty 100

## 2017-05-03 MED ORDER — POTASSIUM CHLORIDE IN NACL 20-0.9 MEQ/L-% IV SOLN
INTRAVENOUS | Status: DC
Start: 2017-05-03 — End: 2017-05-18
  Administered 2017-05-03: 13:00:00 via INTRAVENOUS
  Administered 2017-05-04: 100 mL/h via INTRAVENOUS
  Administered 2017-05-04 – 2017-05-18 (×22): via INTRAVENOUS
  Filled 2017-05-03 (×35): qty 1000

## 2017-05-03 MED ORDER — FENTANYL CITRATE (PF) 250 MCG/5ML IJ SOLN
INTRAMUSCULAR | Status: AC
  Filled 2017-05-03: qty 5

## 2017-05-03 MED ORDER — VANCOMYCIN HCL 500 MG IV SOLR
INTRAVENOUS | Status: DC | PRN
  Administered 2017-05-03: 500 mg

## 2017-05-03 MED ORDER — GENTAMICIN SULFATE 40 MG/ML IJ SOLN
INTRAMUSCULAR | Status: AC
  Filled 2017-05-03: qty 4

## 2017-05-03 MED ORDER — ARTIFICIAL TEARS OPHTHALMIC OINT
TOPICAL_OINTMENT | OPHTHALMIC | Status: DC | PRN
  Administered 2017-05-03: 1 via OPHTHALMIC

## 2017-05-03 MED ORDER — ORAL CARE MOUTH RINSE
15.0000 mL | OROMUCOSAL | Status: DC
  Administered 2017-05-03 – 2017-05-21 (×170): 15 mL via OROMUCOSAL

## 2017-05-03 MED ORDER — PROPOFOL 1000 MG/100ML IV EMUL
5.0000 ug/kg/min | Freq: Once | INTRAVENOUS | Status: DC
  Administered 2017-05-03: 04:00:00 via INTRAVENOUS

## 2017-05-03 MED ORDER — DEXAMETHASONE SODIUM PHOSPHATE 10 MG/ML IJ SOLN
INTRAMUSCULAR | Status: AC
  Filled 2017-05-03: qty 1

## 2017-05-03 MED ORDER — DEXAMETHASONE SODIUM PHOSPHATE 10 MG/ML IJ SOLN
INTRAMUSCULAR | Status: DC | PRN
  Administered 2017-05-03: 10 mg via INTRAVENOUS

## 2017-05-03 MED ORDER — MUPIROCIN CALCIUM 2 % EX CREA
TOPICAL_CREAM | CUTANEOUS | Status: AC
  Filled 2017-05-03: qty 15

## 2017-05-03 MED ORDER — PROPOFOL 500 MG/50ML IV EMUL
INTRAVENOUS | Status: DC | PRN
  Administered 2017-05-03: 75 ug/kg/min via INTRAVENOUS

## 2017-05-03 MED ORDER — PROPOFOL 10 MG/ML IV BOLUS
INTRAVENOUS | Status: AC
  Filled 2017-05-03: qty 20

## 2017-05-03 MED ORDER — ALBUMIN HUMAN 5 % IV SOLN
INTRAVENOUS | Status: DC | PRN
  Administered 2017-05-03 (×2): via INTRAVENOUS

## 2017-05-03 MED ORDER — CEFAZOLIN SODIUM-DEXTROSE 2-4 GM/100ML-% IV SOLN
2.0000 g | Freq: Once | INTRAVENOUS | Status: AC
  Administered 2017-05-03: 2 g via INTRAVENOUS
  Filled 2017-05-03: qty 100

## 2017-05-03 MED ORDER — GENTAMICIN SULFATE 40 MG/ML IJ SOLN
INTRAMUSCULAR | Status: DC | PRN
  Administered 2017-05-03: 120 mg

## 2017-05-03 MED ORDER — CEFAZOLIN SODIUM-DEXTROSE 1-4 GM/50ML-% IV SOLN
1.0000 g | Freq: Three times a day (TID) | INTRAVENOUS | Status: AC
  Administered 2017-05-03 – 2017-05-05 (×6): 1 g via INTRAVENOUS
  Filled 2017-05-03 (×6): qty 50

## 2017-05-03 MED ORDER — ONDANSETRON HCL 4 MG/2ML IJ SOLN
INTRAMUSCULAR | Status: DC | PRN
  Administered 2017-05-03: 4 mg via INTRAVENOUS

## 2017-05-03 MED ORDER — FENTANYL 2500MCG IN NS 250ML (10MCG/ML) PREMIX INFUSION
50.0000 ug/h | INTRAVENOUS | Status: DC
  Administered 2017-05-03: 50 ug/h via INTRAVENOUS

## 2017-05-03 MED ORDER — VECURONIUM BROMIDE 10 MG IV SOLR
INTRAVENOUS | Status: AC
Start: 2017-05-03 — End: 2017-05-03
  Filled 2017-05-03: qty 10

## 2017-05-03 MED ORDER — ARTIFICIAL TEARS OPHTHALMIC OINT
TOPICAL_OINTMENT | OPHTHALMIC | Status: AC
  Filled 2017-05-03: qty 3.5

## 2017-05-03 MED ORDER — CEFAZOLIN SODIUM-DEXTROSE 1-4 GM/50ML-% IV SOLN
INTRAVENOUS | Status: DC | PRN
  Administered 2017-05-03: 2 g via INTRAVENOUS

## 2017-05-03 MED ORDER — 0.9 % SODIUM CHLORIDE (POUR BTL) OPTIME
TOPICAL | Status: DC | PRN
  Administered 2017-05-03: 1000 mL

## 2017-05-03 MED ORDER — CHLORHEXIDINE GLUCONATE 0.12% ORAL RINSE (MEDLINE KIT)
15.0000 mL | Freq: Two times a day (BID) | OROMUCOSAL | Status: DC
  Administered 2017-05-03 – 2017-05-20 (×35): 15 mL via OROMUCOSAL

## 2017-05-03 MED ORDER — FENTANYL BOLUS VIA INFUSION
50.0000 ug | INTRAVENOUS | Status: DC | PRN
  Administered 2017-05-03 – 2017-05-16 (×6): 50 ug via INTRAVENOUS
  Filled 2017-05-03: qty 50

## 2017-05-03 MED ORDER — ONDANSETRON HCL 4 MG/2ML IJ SOLN
INTRAMUSCULAR | Status: AC
Start: 2017-05-03 — End: 2017-05-03
  Filled 2017-05-03: qty 2

## 2017-05-03 MED ORDER — CEFAZOLIN SODIUM 1 G IJ SOLR
INTRAMUSCULAR | Status: AC
  Filled 2017-05-03: qty 20

## 2017-05-03 MED ORDER — MUPIROCIN 2 % EX OINT
TOPICAL_OINTMENT | CUTANEOUS | Status: AC
  Filled 2017-05-03: qty 22

## 2017-05-03 MED ORDER — ETOMIDATE 2 MG/ML IV SOLN
INTRAVENOUS | Status: AC | PRN
  Administered 2017-05-03: 40 mg via INTRAVENOUS

## 2017-05-03 MED ORDER — PANTOPRAZOLE SODIUM 40 MG IV SOLR
40.0000 mg | Freq: Every day | INTRAVENOUS | Status: DC
  Administered 2017-05-03 – 2017-05-08 (×6): 40 mg via INTRAVENOUS
  Filled 2017-05-03 (×5): qty 40

## 2017-05-03 SURGICAL SUPPLY — 114 items
ALCOHOL 70% 16 OZ (MISCELLANEOUS) ×4 IMPLANT
BANDAGE ACE 4X5 VEL STRL LF (GAUZE/BANDAGES/DRESSINGS) ×8 IMPLANT
BANDAGE ACE 6X5 VEL STRL LF (GAUZE/BANDAGES/DRESSINGS) ×8 IMPLANT
BANDAGE ESMARK 6X9 LF (GAUZE/BANDAGES/DRESSINGS) ×2 IMPLANT
BAR GLASS FIBER EXFX 11X350 (EXFIX) ×8 IMPLANT
BIT DRILL 3.8X6 NS (BIT) ×8 IMPLANT
BIT DRILL 5.3 (BIT) ×4 IMPLANT
BIT DRILL 6.5X4.8 (BIT) ×4 IMPLANT
BLADE CLIPPER SURG (BLADE) IMPLANT
BLADE SURG 15 STRL LF DISP TIS (BLADE) ×2 IMPLANT
BLADE SURG 15 STRL SS (BLADE) ×2
BNDG COHESIVE 4X5 TAN STRL (GAUZE/BANDAGES/DRESSINGS) IMPLANT
BNDG COHESIVE 6X5 TAN STRL LF (GAUZE/BANDAGES/DRESSINGS) ×4 IMPLANT
BNDG ESMARK 6X9 LF (GAUZE/BANDAGES/DRESSINGS) ×4
BNDG GAUZE ELAST 4 BULKY (GAUZE/BANDAGES/DRESSINGS) ×8 IMPLANT
CAP PROTECTIVE TRANSFX 4.5X5MM (EXFIX) ×8 IMPLANT
CLAMP BLUE BAR TO PIN (EXFIX) ×8 IMPLANT
CLEANER TIP ELECTROSURG 2X2 (MISCELLANEOUS) ×4 IMPLANT
CLOSURE WOUND 1/2 X4 (GAUZE/BANDAGES/DRESSINGS)
COVER PERINEAL POST (MISCELLANEOUS) ×4 IMPLANT
COVER SURGICAL LIGHT HANDLE (MISCELLANEOUS) ×4 IMPLANT
CUFF TOURNIQUET SINGLE 18IN (TOURNIQUET CUFF) IMPLANT
CUFF TOURNIQUET SINGLE 24IN (TOURNIQUET CUFF) IMPLANT
CUFF TOURNIQUET SINGLE 34IN LL (TOURNIQUET CUFF) IMPLANT
CUFF TOURNIQUET SINGLE 44IN (TOURNIQUET CUFF) IMPLANT
DRAPE C-ARM 42X72 X-RAY (DRAPES) IMPLANT
DRAPE HALF SHEET 40X57 (DRAPES) ×8 IMPLANT
DRAPE OEC MINIVIEW 54X84 (DRAPES) ×4 IMPLANT
DRAPE ORTHO SPLIT 77X108 STRL (DRAPES) ×4
DRAPE STERI IOBAN 125X83 (DRAPES) ×4 IMPLANT
DRAPE SURG ORHT 6 SPLT 77X108 (DRAPES) ×4 IMPLANT
DRAPE U-SHAPE 47X51 STRL (DRAPES) ×4 IMPLANT
DRSG ADAPTIC 3X8 NADH LF (GAUZE/BANDAGES/DRESSINGS) ×4 IMPLANT
DRSG AQUACEL AG ADV 3.5X 4 (GAUZE/BANDAGES/DRESSINGS) ×8 IMPLANT
DRSG AQUACEL AG ADV 3.5X 6 (GAUZE/BANDAGES/DRESSINGS) ×4 IMPLANT
DRSG MEPILEX BORDER 4X4 (GAUZE/BANDAGES/DRESSINGS) IMPLANT
DRSG MEPILEX BORDER 4X8 (GAUZE/BANDAGES/DRESSINGS) IMPLANT
DRSG PAD ABDOMINAL 8X10 ST (GAUZE/BANDAGES/DRESSINGS) IMPLANT
DURAPREP 26ML APPLICATOR (WOUND CARE) IMPLANT
ELECT REM PT RETURN 9FT ADLT (ELECTROSURGICAL) ×4
ELECTRODE REM PT RTRN 9FT ADLT (ELECTROSURGICAL) ×2 IMPLANT
EVACUATOR 1/8 PVC DRAIN (DRAIN) IMPLANT
FACESHIELD WRAPAROUND (MASK) IMPLANT
GAUZE SPONGE 4X4 12PLY STRL (GAUZE/BANDAGES/DRESSINGS) ×8 IMPLANT
GAUZE XEROFORM 5X9 LF (GAUZE/BANDAGES/DRESSINGS) ×4 IMPLANT
GLOVE BIOGEL PI IND STRL 7.5 (GLOVE) ×2 IMPLANT
GLOVE BIOGEL PI IND STRL 8 (GLOVE) ×2 IMPLANT
GLOVE BIOGEL PI INDICATOR 7.5 (GLOVE) ×2
GLOVE BIOGEL PI INDICATOR 8 (GLOVE) ×2
GLOVE ECLIPSE 7.0 STRL STRAW (GLOVE) ×4 IMPLANT
GLOVE SURG ORTHO 8.0 STRL STRW (GLOVE) ×4 IMPLANT
GOWN STRL REUS W/ TWL LRG LVL3 (GOWN DISPOSABLE) ×6 IMPLANT
GOWN STRL REUS W/ TWL XL LVL3 (GOWN DISPOSABLE) ×2 IMPLANT
GOWN STRL REUS W/TWL LRG LVL3 (GOWN DISPOSABLE) ×6
GOWN STRL REUS W/TWL XL LVL3 (GOWN DISPOSABLE) ×2
GUIDEPIN 3.2X17.5 THRD DISP (PIN) ×8 IMPLANT
GUIDEWIRE BALL NOSE 100CM (WIRE) ×4 IMPLANT
HANDPIECE INTERPULSE COAX TIP (DISPOSABLE)
KIT BASIN OR (CUSTOM PROCEDURE TRAY) ×4 IMPLANT
KIT ROOM TURNOVER OR (KITS) ×4 IMPLANT
KIT STIMULAN RAPID CURE 5CC (Orthopedic Implant) ×4 IMPLANT
LINER BOOT UNIVERSAL DISP (MISCELLANEOUS) IMPLANT
MANIFOLD NEPTUNE II (INSTRUMENTS) ×4 IMPLANT
NAIL FEMORAL 11X36CM (Nail) ×4 IMPLANT
NEEDLE 22X1 1/2 (OR ONLY) (NEEDLE) IMPLANT
NS IRRIG 1000ML POUR BTL (IV SOLUTION) ×4 IMPLANT
PACK GENERAL/GYN (CUSTOM PROCEDURE TRAY) ×4 IMPLANT
PACK ORTHO EXTREMITY (CUSTOM PROCEDURE TRAY) ×4 IMPLANT
PAD ABD 8X10 STRL (GAUZE/BANDAGES/DRESSINGS) ×32 IMPLANT
PAD ARMBOARD 7.5X6 YLW CONV (MISCELLANEOUS) ×8 IMPLANT
PAD CAST 4YDX4 CTTN HI CHSV (CAST SUPPLIES) IMPLANT
PADDING CAST ABS 6INX4YD NS (CAST SUPPLIES) ×8
PADDING CAST ABS COTTON 6X4 NS (CAST SUPPLIES) ×8 IMPLANT
PADDING CAST COTTON 4X4 STRL (CAST SUPPLIES)
PADDING CAST COTTON 6X4 STRL (CAST SUPPLIES) ×12 IMPLANT
PIN CLAMP 2BAR 75MM BLUE (EXFIX) ×4 IMPLANT
PIN HALF YELLOW 5X160X35 (EXFIX) ×8 IMPLANT
PIN TRANSFIXING 5.0 (EXFIX) ×4 IMPLANT
SCREW ACE CORTICAL 6.5X70MML (Screw) ×4 IMPLANT
SCREW ACECAP 40MM (Screw) ×4 IMPLANT
SCREW ACECAP 44MM (Screw) ×4 IMPLANT
SCREW LAG 6.5MMX95MM (Screw) ×4 IMPLANT
SCREWDRIVER HEX TIP 3.5MM (MISCELLANEOUS) ×4 IMPLANT
SET HNDPC FAN SPRY TIP SCT (DISPOSABLE) IMPLANT
SPONGE LAP 18X18 X RAY DECT (DISPOSABLE) ×4 IMPLANT
SPONGE LAP 4X18 X RAY DECT (DISPOSABLE) IMPLANT
STAPLER VISISTAT 35W (STAPLE) ×4 IMPLANT
STOCKINETTE IMPERVIOUS 9X36 MD (GAUZE/BANDAGES/DRESSINGS) IMPLANT
STOCKINETTE IMPERVIOUS LG (DRAPES) ×4 IMPLANT
STRIP CLOSURE SKIN 1/2X4 (GAUZE/BANDAGES/DRESSINGS) IMPLANT
SUCTION FRAZIER HANDLE 10FR (MISCELLANEOUS)
SUCTION TUBE FRAZIER 10FR DISP (MISCELLANEOUS) IMPLANT
SUT ETHILON 2 0 FS 18 (SUTURE) ×8 IMPLANT
SUT ETHILON 3 0 FSL (SUTURE) ×8 IMPLANT
SUT ETHILON 3 0 PS 1 (SUTURE) IMPLANT
SUT VIC AB 0 CT1 27 (SUTURE) ×4
SUT VIC AB 0 CT1 27XBRD ANBCTR (SUTURE) ×4 IMPLANT
SUT VIC AB 2-0 CT1 27 (SUTURE) ×4
SUT VIC AB 2-0 CT1 TAPERPNT 27 (SUTURE) ×4 IMPLANT
SUT VIC AB 2-0 CTB1 (SUTURE) ×4 IMPLANT
SUT VIC AB 3-0 SH 27 (SUTURE)
SUT VIC AB 3-0 SH 27X BRD (SUTURE) IMPLANT
SUT VICRYL 0 UR6 27IN ABS (SUTURE) ×8 IMPLANT
SWAB CULTURE ESWAB REG 1ML (MISCELLANEOUS) IMPLANT
SWAB CULTURE LIQ STUART DBL (MISCELLANEOUS) IMPLANT
SYR CONTROL 10ML LL (SYRINGE) IMPLANT
TAPE STRIPS DRAPE STRL (GAUZE/BANDAGES/DRESSINGS) IMPLANT
TOWEL OR 17X24 6PK STRL BLUE (TOWEL DISPOSABLE) ×8 IMPLANT
TOWEL OR 17X26 10 PK STRL BLUE (TOWEL DISPOSABLE) ×4 IMPLANT
TUBE CONNECTING 12'X1/4 (SUCTIONS) ×1
TUBE CONNECTING 12X1/4 (SUCTIONS) ×3 IMPLANT
UNDERPAD 30X30 (UNDERPADS AND DIAPERS) ×4 IMPLANT
WATER STERILE IRR 1000ML POUR (IV SOLUTION) ×8 IMPLANT
YANKAUER SUCT BULB TIP NO VENT (SUCTIONS) ×4 IMPLANT

## 2017-05-03 NOTE — Anesthesia Preprocedure Evaluation (Signed)
Anesthesia Evaluation  Patient identified by MRN, date of birth, ID band Patient unresponsive    Reviewed: Allergy & Precautions, Patient's Chart, lab work & pertinent test results  Airway Mallampati: Intubated       Dental no notable dental hx.    Pulmonary neg pulmonary ROS,    Pulmonary exam normal breath sounds clear to auscultation       Cardiovascular negative cardio ROS Normal cardiovascular exam Rhythm:Regular Rate:Normal     Neuro/Psych TBI negative psych ROS   GI/Hepatic negative GI ROS, Neg liver ROS,   Endo/Other  negative endocrine ROS  Renal/GU negative Renal ROS     Musculoskeletal negative musculoskeletal ROS (+)   Abdominal   Peds  Hematology negative hematology ROS (+)   Anesthesia Other Findings   Reproductive/Obstetrics                             Anesthesia Physical Anesthesia Plan  ASA: II  Anesthesia Plan: General   Post-op Pain Management:    Induction: Inhalational  PONV Risk Score and Plan: 3 and Ondansetron, Dexamethasone and Propofol infusion  Airway Management Planned: Oral ETT  Additional Equipment:   Intra-op Plan:   Post-operative Plan: Post-operative intubation/ventilation  Informed Consent: I have reviewed the patients History and Physical, chart, labs and discussed the procedure including the risks, benefits and alternatives for the proposed anesthesia with the patient or authorized representative who has indicated his/her understanding and acceptance.   Dental advisory given  Plan Discussed with: CRNA  Anesthesia Plan Comments:         Anesthesia Quick Evaluation

## 2017-05-03 NOTE — ED Notes (Signed)
Called respiratory spoke with Mirium to bring pt to OR
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
## 2017-05-03 NOTE — Transfer of Care (Signed)
Immediate Anesthesia Transfer of Care Note  Patient: Vincent OLAZABAL Sr.  Procedure(s) Performed: Procedure(s): IRRIGATION AND DEBRIDEMENT LEFT LOWER EXTREMITY (Left) EXTERNAL FIXATION LEFT LOWER LEG (Left) LEFT INTRAMEDULLARY (IM) NAIL FEMORAL (Left)  Patient Location: ICU  Anesthesia Type:General  Level of Consciousness: sedated and Patient remains intubated per anesthesia plan  Airway & Oxygen Therapy: Patient remains intubated per anesthesia plan and Patient placed on Ventilator (see vital sign flow sheet for setting)  Post-op Assessment: Report given to RN and Post -op Vital signs reviewed and stable  Post vital signs: Reviewed and stable  Last Vitals:  Vitals:   05/03/17 0545 05/03/17 0600  BP: 139/90 127/86  Pulse: 91 84  Resp: (!) 22 17  Temp:    SpO2: 100% 100%    Last Pain: There were no vitals filed for this visit.       Complications: No apparent anesthesia complications
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
## 2017-05-03 NOTE — Procedures (Signed)
Pt transported to ED room to CT them back to ED room without complications.
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
## 2017-05-03 NOTE — ED Notes (Signed)
Wallet given to wife with Chaplain
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
## 2017-05-03 NOTE — Procedures (Signed)
Preop diagnosis: Tongue laceration Postop diagnosis: same Procedure: Simple closure of tongue lacerations, 9 cm Surgeon: Redmond Baseman Anesth: IV sedation Compl: None Findings: 5 cm dorsal laceration and 4 cm ventral laceration, not through-and-through Description: At the bedside, his mouth was retracted open allowing good visualization of the tongue.  The lacerations were closed in a simple, interrupted fashion using 3-0 Vicryl.  A couple of smaller lacerations were not closed on the ventral tongue.  He tolerated the procedure well.

## 2017-05-03 NOTE — Plan of Care (Signed)
Problem: Pain Management: Goal: Pain level will decrease with appropriate interventions Outcome: Progressing Pt tolerating passive ROM, q2hr turns. Fentanyl gtt and propofol gtt for sedation and pain management.
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
## 2017-05-03 NOTE — ED Notes (Signed)
Wedding band removed by OR given to this RN who gave ring to daughter in consultation room.  Gave Aunt clothing.
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
## 2017-05-03 NOTE — Progress Notes (Signed)
Talus fracture on right. This is a closed injury. Plan for splint for now CT scan later and fixation at a later time as well.
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
## 2017-05-04 ENCOUNTER — Inpatient Hospital Stay (HOSPITAL_COMMUNITY): Payer: Medicaid Other

## 2017-05-04 LAB — BASIC METABOLIC PANEL
Anion gap: 8 (ref 5–15)
BUN: 10 mg/dL (ref 6–20)
CHLORIDE: 109 mmol/L (ref 101–111)
CO2: 22 mmol/L (ref 22–32)
CREATININE: 1.01 mg/dL (ref 0.61–1.24)
Calcium: 7.8 mg/dL — ABNORMAL LOW (ref 8.9–10.3)
GFR calc non Af Amer: >60 mL/min (ref >60)
Glucose, Bld: 106 mg/dL — ABNORMAL HIGH (ref 65–99)
POTASSIUM: 4.2 mmol/L (ref 3.5–5.1)
Sodium: 139 mmol/L (ref 135–145)

## 2017-05-04 LAB — BLOOD GAS, ARTERIAL
ACID-BASE DEFICIT: 2.3 mmol/L — AB (ref 0.0–2.0)
BICARBONATE: 21.9 mmol/L (ref 20.0–28.0)
Drawn by: 43707
FIO2: 40
LHR: 16 {breaths}/min
MECHVT: 520 mL
O2 SAT: 98.4 %
PATIENT TEMPERATURE: 99.2
PCO2 ART: 37.6 mmHg (ref 32.0–48.0)
PEEP/CPAP: 5 cmH2O
PH ART: 7.385 (ref 7.350–7.450)
PO2 ART: 126 mmHg — AB (ref 83.0–108.0)

## 2017-05-04 LAB — CBC
HCT: 27.1 % — ABNORMAL LOW (ref 39.0–52.0)
Hemoglobin: 8.8 g/dL — ABNORMAL LOW (ref 13.0–17.0)
MCH: 26.7 pg (ref 26.0–34.0)
MCHC: 32.5 g/dL (ref 30.0–36.0)
MCV: 82.4 fL (ref 78.0–100.0)
PLATELETS: 159 10*3/uL (ref 150–400)
RBC: 3.29 MIL/uL — ABNORMAL LOW (ref 4.22–5.81)
RDW: 15.4 % (ref 11.5–15.5)
WBC: 7.9 10*3/uL (ref 4.0–10.5)

## 2017-05-04 LAB — HIV ANTIBODY (ROUTINE TESTING W REFLEX): HIV SCREEN 4TH GENERATION: NONREACTIVE

## 2017-05-04 LAB — MAGNESIUM
Magnesium: 2 mg/dL (ref 1.7–2.4)
Magnesium: 2.1 mg/dL (ref 1.7–2.4)

## 2017-05-04 LAB — PHOSPHORUS
PHOSPHORUS: 2.2 mg/dL — AB (ref 2.5–4.6)
Phosphorus: 2.4 mg/dL — ABNORMAL LOW (ref 2.5–4.6)

## 2017-05-04 MED ORDER — PIVOT 1.5 CAL PO LIQD
1000.0000 mL | ORAL | Status: DC
  Administered 2017-05-04 – 2017-05-14 (×9): 1000 mL
  Filled 2017-05-04 (×12): qty 1000

## 2017-05-04 MED ORDER — PRO-STAT SUGAR FREE PO LIQD
60.0000 mL | Freq: Three times a day (TID) | ORAL | Status: DC
  Administered 2017-05-04 – 2017-05-14 (×27): 60 mL
  Filled 2017-05-04 (×25): qty 60

## 2017-05-04 NOTE — Progress Notes (Signed)
Patient is intubated in the trauma ICU. He is stable from an orthopedic standpoint. His dressings are clean dry and intact. The pin sites are hemostatic. His compartments are soft. Plan is for definitive fixation of his fractures this coming week with either Dr. Marlou Sa or Dr. Marcelino Scot.  Azucena Cecil, MD [place] 10:54 AM

## 2017-05-04 NOTE — Progress Notes (Signed)
Rt note-Placed patient on SBT, failed due to apnea and effort, decreased sp02 46%, placed back to full support, sp02 now 100%. RN in room and is aware of failed wean.
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
## 2017-05-04 NOTE — Progress Notes (Signed)
Pt seen and examined.  Remains intubated and sedated  EXAM: Temp:  [96.2 F (35.7 C)-100.9 F (38.3 C)] 99.1 F (37.3 C) (08/25 0400) Pulse Rate:  [58-96] 67 (08/25 0400) Resp:  [7-22] 14 (08/25 0400) BP: (97-140)/(56-90) 99/56 (08/25 0400) SpO2:  [99 %-100 %] 100 % (08/25 0400) FiO2 (%):  [40 %-100 %] 40 % (08/25 0358) Weight:  [97.7 kg (215 lb 6.2 oz)-127 kg (280 lb)] 97.7 kg (215 lb 6.2 oz) (08/24 1030) Intake/Output      08/24 0701 - 08/25 0700   I.V. (mL/kg) 3688.5 (37.8)   IV Piggyback 810   Total Intake(mL/kg) 4498.5 (46)   Urine (mL/kg/hr) 925 (0.4)   Blood 100   Total Output 1025   Net +3473.5        Intubated Sedated  Plan Repeat head CT stable from yesterday Attempt to light sedation to monitor neuro exam No new NS recs Continue Keppra

## 2017-05-04 NOTE — Progress Notes (Signed)
Asked by Dr Redmond Pulling to check on Hepatitis Panel. Lab states this lab is a "send out" and will be resulted tomorrow.

## 2017-05-05 ENCOUNTER — Inpatient Hospital Stay (HOSPITAL_COMMUNITY): Payer: Medicaid Other

## 2017-05-05 ENCOUNTER — Encounter (HOSPITAL_COMMUNITY): Payer: Self-pay

## 2017-05-05 LAB — COMPREHENSIVE METABOLIC PANEL
ALBUMIN: 2.4 g/dL — AB (ref 3.5–5.0)
ALK PHOS: 47 U/L (ref 38–126)
ALT: 86 U/L — ABNORMAL HIGH (ref 17–63)
ANION GAP: 5 (ref 5–15)
AST: 85 U/L — ABNORMAL HIGH (ref 15–41)
BILIRUBIN TOTAL: 0.7 mg/dL (ref 0.3–1.2)
BUN: 8 mg/dL (ref 6–20)
CALCIUM: 7.9 mg/dL — AB (ref 8.9–10.3)
CO2: 26 mmol/L (ref 22–32)
Chloride: 107 mmol/L (ref 101–111)
Creatinine, Ser: 0.84 mg/dL (ref 0.61–1.24)
GFR calc Af Amer: >60 mL/min (ref >60)
GLUCOSE: 113 mg/dL — AB (ref 65–99)
POTASSIUM: 4.5 mmol/L (ref 3.5–5.1)
Sodium: 138 mmol/L (ref 135–145)
TOTAL PROTEIN: 4.9 g/dL — AB (ref 6.5–8.1)

## 2017-05-05 LAB — GLUCOSE, CAPILLARY
GLUCOSE-CAPILLARY: 137 mg/dL — AB (ref 65–99)
GLUCOSE-CAPILLARY: 133 mg/dL — AB (ref 65–99)
Glucose-Capillary: 108 mg/dL — ABNORMAL HIGH (ref 65–99)
Glucose-Capillary: 117 mg/dL — ABNORMAL HIGH (ref 65–99)
Glucose-Capillary: 122 mg/dL — ABNORMAL HIGH (ref 65–99)
Glucose-Capillary: 130 mg/dL — ABNORMAL HIGH (ref 65–99)

## 2017-05-05 LAB — CBC
HCT: 25.1 % — ABNORMAL LOW (ref 39.0–52.0)
Hemoglobin: 8.1 g/dL — ABNORMAL LOW (ref 13.0–17.0)
MCH: 27.6 pg (ref 26.0–34.0)
MCHC: 32.3 g/dL (ref 30.0–36.0)
MCV: 85.7 fL (ref 78.0–100.0)
Platelets: 132 10*3/uL — ABNORMAL LOW (ref 150–400)
RBC: 2.93 MIL/uL — ABNORMAL LOW (ref 4.22–5.81)
RDW: 16 % — ABNORMAL HIGH (ref 11.5–15.5)
WBC: 6.9 10*3/uL (ref 4.0–10.5)

## 2017-05-05 LAB — PHOSPHORUS
Phosphorus: 1.6 mg/dL — ABNORMAL LOW (ref 2.5–4.6)
Phosphorus: 1.5 mg/dL — ABNORMAL LOW (ref 2.5–4.6)

## 2017-05-05 LAB — MAGNESIUM
MAGNESIUM: 2.1 mg/dL (ref 1.7–2.4)
Magnesium: 2.1 mg/dL (ref 1.7–2.4)

## 2017-05-05 MED ORDER — ACETAMINOPHEN 650 MG RE SUPP
650.0000 mg | RECTAL | Status: DC | PRN
  Administered 2017-05-11: 650 mg via RECTAL
  Filled 2017-05-05: qty 1

## 2017-05-05 MED ORDER — ACETAMINOPHEN 325 MG PO TABS: 650.0000 mg | ORAL_TABLET | Freq: Four times a day (QID) | ORAL | Status: DC | PRN

## 2017-05-05 MED ORDER — ACETAMINOPHEN 160 MG/5ML PO SOLN
650.0000 mg | Freq: Four times a day (QID) | ORAL | Status: DC | PRN
  Administered 2017-05-05 – 2017-05-17 (×15): 650 mg
  Filled 2017-05-05 (×15): qty 20.3

## 2017-05-05 MED ORDER — SODIUM CHLORIDE 0.9 % IV BOLUS (SEPSIS)
1000.0000 mL | Freq: Once | INTRAVENOUS | Status: AC
  Administered 2017-05-05: 1000 mL via INTRAVENOUS
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
## 2017-05-06 ENCOUNTER — Inpatient Hospital Stay (HOSPITAL_COMMUNITY): Payer: Medicaid Other

## 2017-05-06 ENCOUNTER — Encounter (HOSPITAL_COMMUNITY): Payer: Self-pay | Admitting: Orthopedic Surgery

## 2017-05-06 LAB — CBC
HCT: 20.9 % — ABNORMAL LOW (ref 39.0–52.0)
Hemoglobin: 6.6 g/dL — CL (ref 13.0–17.0)
MCH: 26.8 pg (ref 26.0–34.0)
MCHC: 31.6 g/dL (ref 30.0–36.0)
MCV: 85 fL (ref 78.0–100.0)
PLATELETS: 141 10*3/uL — AB (ref 150–400)
RBC: 2.46 MIL/uL — ABNORMAL LOW (ref 4.22–5.81)
RDW: 15.6 % — AB (ref 11.5–15.5)
WBC: 8.1 10*3/uL (ref 4.0–10.5)

## 2017-05-06 LAB — URINE CULTURE: CULTURE: NO GROWTH

## 2017-05-06 LAB — HEMOGLOBIN AND HEMATOCRIT, BLOOD
HEMATOCRIT: 24.5 % — AB (ref 39.0–52.0)
HEMOGLOBIN: 7.7 g/dL — AB (ref 13.0–17.0)

## 2017-05-06 LAB — GLUCOSE, CAPILLARY
GLUCOSE-CAPILLARY: 123 mg/dL — AB (ref 65–99)
GLUCOSE-CAPILLARY: 131 mg/dL — AB (ref 65–99)
GLUCOSE-CAPILLARY: 150 mg/dL — AB (ref 65–99)
GLUCOSE-CAPILLARY: 163 mg/dL — AB (ref 65–99)
GLUCOSE-CAPILLARY: 110 mg/dL — AB (ref 65–99)
Glucose-Capillary: 139 mg/dL — ABNORMAL HIGH (ref 65–99)
Glucose-Capillary: 133 mg/dL — ABNORMAL HIGH (ref 65–99)

## 2017-05-06 LAB — BASIC METABOLIC PANEL
ANION GAP: 4 — AB (ref 5–15)
BUN: 11 mg/dL (ref 6–20)
CALCIUM: 8 mg/dL — AB (ref 8.9–10.3)
CO2: 28 mmol/L (ref 22–32)
CREATININE: 0.66 mg/dL (ref 0.61–1.24)
Chloride: 108 mmol/L (ref 101–111)
GFR calc non Af Amer: >60 mL/min (ref >60)
GLUCOSE: 144 mg/dL — AB (ref 65–99)
Potassium: 4 mmol/L (ref 3.5–5.1)
Sodium: 140 mmol/L (ref 135–145)

## 2017-05-06 LAB — PREPARE RBC (CROSSMATCH)

## 2017-05-06 LAB — LACTIC ACID, PLASMA: LACTIC ACID, VENOUS: 0.8 mmol/L (ref 0.5–1.9)

## 2017-05-06 LAB — TRIGLYCERIDES: TRIGLYCERIDES: 127 mg/dL (ref <150)

## 2017-05-06 MED ORDER — QUETIAPINE FUMARATE 25 MG PO TABS
50.0000 mg | ORAL_TABLET | Freq: Two times a day (BID) | ORAL | Status: DC
  Administered 2017-05-06 – 2017-05-21 (×30): 50 mg
  Filled 2017-05-06 (×30): qty 2

## 2017-05-06 MED ORDER — SODIUM CHLORIDE 0.9 % IV SOLN
Freq: Once | INTRAVENOUS | Status: AC
  Administered 2017-05-06: 13:00:00 via INTRAVENOUS

## 2017-05-06 MED ORDER — CLONAZEPAM 0.5 MG PO TBDP
0.5000 mg | ORAL_TABLET | Freq: Two times a day (BID) | ORAL | Status: DC
  Administered 2017-05-06 – 2017-05-17 (×23): 0.5 mg
  Filled 2017-05-06 (×23): qty 1

## 2017-05-06 MED ORDER — VECURONIUM BROMIDE 10 MG IV SOLR
INTRAVENOUS | Status: AC
  Administered 2017-05-06: 10 mg
  Filled 2017-05-06: qty 10

## 2017-05-06 MED ORDER — VECURONIUM BROMIDE 10 MG IV SOLR: 10.0000 mg | Freq: Once | INTRAVENOUS | Status: AC

## 2017-05-06 MED ORDER — GADOBENATE DIMEGLUMINE 529 MG/ML IV SOLN
20.0000 mL | Freq: Once | INTRAVENOUS | Status: AC | PRN
  Administered 2017-05-06: 20 mL via INTRAVENOUS

## 2017-05-06 MED ORDER — PIPERACILLIN-TAZOBACTAM 3.375 G IVPB
3.3750 g | Freq: Three times a day (TID) | INTRAVENOUS | Status: DC
  Administered 2017-05-06 – 2017-05-08 (×6): 3.375 g via INTRAVENOUS
  Filled 2017-05-06 (×8): qty 50

## 2017-05-06 NOTE — Progress Notes (Signed)
Patient transported on vent to MRI and back to 2J-03 without complication.
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
## 2017-05-06 NOTE — Anesthesia Postprocedure Evaluation (Signed)
Anesthesia Post Note  Patient: Vincent RAMTHUN Sr.  Procedure(Elliott) Performed: Procedure(Elliott) (LRB): IRRIGATION AND DEBRIDEMENT LEFT LOWER EXTREMITY (Left) EXTERNAL FIXATION LEFT LOWER LEG (Left) LEFT INTRAMEDULLARY (IM) NAIL FEMORAL (Left) IRRIGATION AND DEBRIDEMENT WOUND (Right)     Patient location during evaluation: SICU Anesthesia Type: General Level of consciousness: sedated Pain management: pain level controlled Vital Signs Assessment: post-procedure vital signs reviewed and stable Respiratory status: patient remains intubated per anesthesia plan Cardiovascular status: stable Anesthetic complications: no    Last Vitals:  Vitals:   05/06/17 0638 05/06/17 0700  BP:  (!) 100/49  Pulse:  72  Resp:  (!) 0  Temp: 37.5 C   SpO2:  99%    Last Pain:  Vitals:   05/06/17 0638  TempSrc: Axillary                 [name]

## 2017-05-06 NOTE — Progress Notes (Signed)
Patient ID: Vincent Barfoot., male   DOB: Sep 07, 1966, 51 y.o.   MRN: [id number] I called his wife, [name], to update her ans discuss planned PEG tomorrow. I left VM. Georganna Skeans, MD, MPH, FACS Trauma: [phone] General Surgery: [phone]

## 2017-05-06 NOTE — Progress Notes (Signed)
Wife unavailable for consent at this time via telephone.  Notified of PICC for 8-28, so no hurry.  RN notified that consent needed once wife on unit.
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
## 2017-05-06 NOTE — Progress Notes (Signed)
Spoke with MRI and pt not able to go to MRI until the external fixator is removed.  Will notify MRI when fixator is removed.  Glenda Chroman RN

## 2017-05-06 NOTE — Progress Notes (Signed)
Spoke with RN regarding PICC order.  States 2 PIV working well, but expect LT IV needs.  Blood cultures pending.  Dr Grandville Silos states okay to place when [facility] negative x 48 hrs.

## 2017-05-06 NOTE — Progress Notes (Signed)
CRITICAL VALUE ALERT  Critical Value:  Hemoglobin 6.6   Date & Time Notied:  05/06/17 1127 am  Provider Notified: Dr Grandville Silos  Orders Received/Actions taken: 2 U RBC
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
## 2017-05-07 ENCOUNTER — Encounter (HOSPITAL_COMMUNITY): Admission: EM | Disposition: A | Discharge status: Rehabilitation Facility | Payer: Self-pay | Source: Home / Self Care

## 2017-05-07 ENCOUNTER — Inpatient Hospital Stay (HOSPITAL_COMMUNITY): Payer: Medicaid Other | Admitting: Certified Registered Nurse Anesthetist

## 2017-05-07 ENCOUNTER — Inpatient Hospital Stay (HOSPITAL_COMMUNITY): Payer: Medicaid Other

## 2017-05-07 ENCOUNTER — Encounter (HOSPITAL_COMMUNITY): Payer: Self-pay | Admitting: Certified Registered Nurse Anesthetist

## 2017-05-07 HISTORY — PX: PEG PLACEMENT: SHX5437

## 2017-05-07 HISTORY — PX: TRACHEOSTOMY TUBE PLACEMENT: SHX814

## 2017-05-07 HISTORY — PX: ESOPHAGOGASTRODUODENOSCOPY: SHX5428

## 2017-05-07 HISTORY — PX: CLOSED REDUCTION MANDIBLE: SHX5307

## 2017-05-07 HISTORY — PX: I&D EXTREMITY: SHX5045

## 2017-05-07 LAB — TYPE AND SCREEN
ABO/RH(D): O POS
ANTIBODY SCREEN: NEGATIVE
UNIT DIVISION: 0
Unit division: 0
Unit division: 0
Unit division: 0

## 2017-05-07 LAB — HEPATITIS PANEL, ACUTE
HEP B C IGM: NEGATIVE
HEP B S AG: NEGATIVE
Hep A IgM: NEGATIVE

## 2017-05-07 LAB — GLUCOSE, CAPILLARY
GLUCOSE-CAPILLARY: 118 mg/dL — AB (ref 65–99)
GLUCOSE-CAPILLARY: 122 mg/dL — AB (ref 65–99)
GLUCOSE-CAPILLARY: 124 mg/dL — AB (ref 65–99)
Glucose-Capillary: 121 mg/dL — ABNORMAL HIGH (ref 65–99)

## 2017-05-07 LAB — BPAM RBC
BLOOD PRODUCT EXPIRATION DATE: 201808312359
BLOOD PRODUCT EXPIRATION DATE: 201809242359
BLOOD PRODUCT EXPIRATION DATE: 201809252359
Blood Product Expiration Date: 201809112359
ISSUE DATE / TIME: 201808252325
ISSUE DATE / TIME: 201808270737
ISSUE DATE / TIME: 201808271217
ISSUE DATE / TIME: 201808271457
Unit Type and Rh: 5100
Unit Type and Rh: 5100
Unit Type and Rh: 9500
Unit Type and Rh: 9500

## 2017-05-07 LAB — BASIC METABOLIC PANEL
ANION GAP: 7 (ref 5–15)
BUN: 14 mg/dL (ref 6–20)
CALCIUM: 8.1 mg/dL — AB (ref 8.9–10.3)
CHLORIDE: 108 mmol/L (ref 101–111)
CO2: 28 mmol/L (ref 22–32)
CREATININE: 0.65 mg/dL (ref 0.61–1.24)
GFR calc non Af Amer: >60 mL/min (ref >60)
Glucose, Bld: 128 mg/dL — ABNORMAL HIGH (ref 65–99)
Potassium: 3.9 mmol/L (ref 3.5–5.1)
SODIUM: 143 mmol/L (ref 135–145)

## 2017-05-07 LAB — PREPARE RBC (CROSSMATCH)

## 2017-05-07 LAB — POCT I-STAT 7, (LYTES, BLD GAS, ICA,H+H)
Acid-base deficit: 5 mmol/L — ABNORMAL HIGH (ref 0.0–2.0)
Bicarbonate: 20.6 mmol/L (ref 20.0–28.0)
Calcium, Ion: 1.1 mmol/L — ABNORMAL LOW (ref 1.15–1.40)
HEMATOCRIT: 30 % — AB (ref 39.0–52.0)
HEMOGLOBIN: 10.2 g/dL — AB (ref 13.0–17.0)
O2 SAT: 99 %
PCO2 ART: 35.1 mmHg (ref 32.0–48.0)
PO2 ART: 161 mmHg — AB (ref 83.0–108.0)
POTASSIUM: 3.7 mmol/L (ref 3.5–5.1)
Patient temperature: 35.2
Sodium: 140 mmol/L (ref 135–145)
TCO2: 22 mmol/L (ref 22–32)
pH, Arterial: 7.369 (ref 7.350–7.450)

## 2017-05-07 LAB — CBC
HEMATOCRIT: 24.1 % — AB (ref 39.0–52.0)
Hemoglobin: 7.8 g/dL — ABNORMAL LOW (ref 13.0–17.0)
MCH: 27.9 pg (ref 26.0–34.0)
MCHC: 32.4 g/dL (ref 30.0–36.0)
MCV: 86.1 fL (ref 78.0–100.0)
Platelets: 143 10*3/uL — ABNORMAL LOW (ref 150–400)
RBC: 2.8 MIL/uL — ABNORMAL LOW (ref 4.22–5.81)
RDW: 15.2 % (ref 11.5–15.5)
WBC: 6.8 10*3/uL (ref 4.0–10.5)

## 2017-05-07 LAB — SURGICAL PCR SCREEN
MRSA, PCR: NEGATIVE
STAPHYLOCOCCUS AUREUS: NEGATIVE

## 2017-05-07 SURGERY — IRRIGATION AND DEBRIDEMENT EXTREMITY
Anesthesia: General | Site: Neck

## 2017-05-07 SURGERY — EGD (ESOPHAGOGASTRODUODENOSCOPY)

## 2017-05-07 MED ORDER — SODIUM CHLORIDE 0.9 % IR SOLN
Status: DC | PRN
Start: 2017-05-07 — End: 2017-05-07
  Administered 2017-05-07: 3000 mL

## 2017-05-07 MED ORDER — VECURONIUM BROMIDE 10 MG IV SOLR
INTRAVENOUS | Status: DC | PRN
  Administered 2017-05-07: 3 mg via INTRAVENOUS
  Administered 2017-05-07: 5 mg via INTRAVENOUS

## 2017-05-07 MED ORDER — FENTANYL CITRATE (PF) 250 MCG/5ML IJ SOLN
INTRAMUSCULAR | Status: AC
  Filled 2017-05-07: qty 5

## 2017-05-07 MED ORDER — LIDOCAINE 2% (20 MG/ML) 5 ML SYRINGE
INTRAMUSCULAR | Status: AC
  Filled 2017-05-07: qty 5

## 2017-05-07 MED ORDER — LIDOCAINE-EPINEPHRINE 2 %-1:100000 IJ SOLN
INTRAMUSCULAR | Status: AC
  Filled 2017-05-07: qty 1

## 2017-05-07 MED ORDER — LIDOCAINE-EPINEPHRINE 1 %-1:100000 IJ SOLN
INTRAMUSCULAR | Status: DC | PRN
Start: 2017-05-07 — End: 2017-05-07
  Administered 2017-05-07: 7 mL

## 2017-05-07 MED ORDER — PROPOFOL 10 MG/ML IV BOLUS
INTRAVENOUS | Status: AC
Start: 2017-05-07 — End: 2017-05-07
  Filled 2017-05-07: qty 20

## 2017-05-07 MED ORDER — ROCURONIUM BROMIDE 100 MG/10ML IV SOLN
INTRAVENOUS | Status: DC | PRN
  Administered 2017-05-07 (×4): 50 mg via INTRAVENOUS

## 2017-05-07 MED ORDER — ROCURONIUM BROMIDE 10 MG/ML (PF) SYRINGE
PREFILLED_SYRINGE | INTRAVENOUS | Status: AC
  Filled 2017-05-07: qty 15

## 2017-05-07 MED ORDER — SODIUM CHLORIDE 0.9 % IV SOLN
Freq: Once | INTRAVENOUS | Status: AC
  Administered 2017-05-18: 22:00:00 via INTRAVENOUS

## 2017-05-07 MED ORDER — PHENYLEPHRINE 40 MCG/ML (10ML) SYRINGE FOR IV PUSH (FOR BLOOD PRESSURE SUPPORT)
PREFILLED_SYRINGE | INTRAVENOUS | Status: AC
  Filled 2017-05-07: qty 10

## 2017-05-07 MED ORDER — VANCOMYCIN HCL 1000 MG IV SOLR
INTRAVENOUS | Status: AC
  Filled 2017-05-07: qty 1000

## 2017-05-07 MED ORDER — VANCOMYCIN HCL 1000 MG IV SOLR
INTRAVENOUS | Status: DC | PRN
  Administered 2017-05-07: 1000 mg via TOPICAL

## 2017-05-07 MED ORDER — ONDANSETRON HCL 4 MG/2ML IJ SOLN
INTRAMUSCULAR | Status: AC
  Filled 2017-05-07: qty 2

## 2017-05-07 MED ORDER — LIDOCAINE-EPINEPHRINE 1 %-1:100000 IJ SOLN
INTRAMUSCULAR | Status: AC
  Filled 2017-05-07: qty 1

## 2017-05-07 MED ORDER — LACTATED RINGERS IV SOLN
INTRAVENOUS | Status: DC | PRN
  Administered 2017-05-07: 13:00:00 via INTRAVENOUS

## 2017-05-07 MED ORDER — SODIUM CHLORIDE 0.9 % IJ SOLN
INTRAMUSCULAR | Status: AC
  Filled 2017-05-07: qty 10

## 2017-05-07 MED ORDER — DEXAMETHASONE SODIUM PHOSPHATE 10 MG/ML IJ SOLN
INTRAMUSCULAR | Status: AC
  Filled 2017-05-07: qty 1

## 2017-05-07 MED ORDER — MIDAZOLAM HCL 5 MG/5ML IJ SOLN
INTRAMUSCULAR | Status: DC | PRN
  Administered 2017-05-07: 2 mg via INTRAVENOUS

## 2017-05-07 MED ORDER — MIDAZOLAM HCL 2 MG/2ML IJ SOLN
INTRAMUSCULAR | Status: AC
  Filled 2017-05-07: qty 2

## 2017-05-07 MED ORDER — FENTANYL CITRATE (PF) 100 MCG/2ML IJ SOLN
INTRAMUSCULAR | Status: DC | PRN
  Administered 2017-05-07: 150 ug via INTRAVENOUS
  Administered 2017-05-07: 50 ug via INTRAVENOUS
  Administered 2017-05-07: 100 ug via INTRAVENOUS
  Administered 2017-05-07 (×4): 50 ug via INTRAVENOUS

## 2017-05-07 SURGICAL SUPPLY — 87 items
BAR GLASS FIBER EXFX 11X150 (EXFIX) ×14 IMPLANT
BAR GLASS FIBER EXFX 11X400 (EXFIX) ×14 IMPLANT
BIT DRILL 3.2 XTRAFIX BLUE (BIT) ×7 IMPLANT
BLADE CLIPPER SURG (BLADE) IMPLANT
BLADE SURG 15 STRL LF DISP TIS (BLADE) ×5 IMPLANT
BLADE SURG 15 STRL SS (BLADE) ×2
BNDG COHESIVE 4X5 TAN STRL (GAUZE/BANDAGES/DRESSINGS) ×7 IMPLANT
BNDG GAUZE ELAST 4 BULKY (GAUZE/BANDAGES/DRESSINGS) ×7 IMPLANT
BNDG GAUZE STRTCH 6 (GAUZE/BANDAGES/DRESSINGS) ×21 IMPLANT
BRUSH SCRUB SURG 4.25 DISP (MISCELLANEOUS) ×14 IMPLANT
CANISTER SUCT 3000ML PPV (MISCELLANEOUS) ×7 IMPLANT
CANISTER WOUND CARE 500ML ATS (WOUND CARE) ×7 IMPLANT
CLAMP BLUE BAR TO BAR (EXFIX) ×14 IMPLANT
CLAMP BLUE BAR TO PIN (EXFIX) ×28 IMPLANT
CLEANER TIP ELECTROSURG 2X2 (MISCELLANEOUS) ×7 IMPLANT
COVER SURGICAL LIGHT HANDLE (MISCELLANEOUS) ×28 IMPLANT
CRADLE DONUT ADULT HEAD (MISCELLANEOUS) IMPLANT
DECANTER SPIKE VIAL GLASS SM (MISCELLANEOUS) IMPLANT
DRAPE HALF SHEET 40X57 (DRAPES) ×7 IMPLANT
DRAPE U-SHAPE 47X51 STRL (DRAPES) ×7 IMPLANT
DRESSING PEEL AND PLAC PRVNA20 (GAUZE/BANDAGES/DRESSINGS) ×5 IMPLANT
DRSG ADAPTIC 3X8 NADH LF (GAUZE/BANDAGES/DRESSINGS) ×7 IMPLANT
DRSG MEPILEX BORDER 4X4 (GAUZE/BANDAGES/DRESSINGS) ×14 IMPLANT
DRSG PEEL AND PLACE PREVENA 20 (GAUZE/BANDAGES/DRESSINGS) ×7
ELECT COATED BLADE 2.86 ST (ELECTRODE) ×21 IMPLANT
ELECT REM PT RETURN 9FT ADLT (ELECTROSURGICAL) ×7
ELECTRODE REM PT RTRN 9FT ADLT (ELECTROSURGICAL) ×5 IMPLANT
GAUZE PETROLATUM 1 X8 (GAUZE/BANDAGES/DRESSINGS) IMPLANT
GAUZE SPONGE 4X4 12PLY STRL (GAUZE/BANDAGES/DRESSINGS) ×7 IMPLANT
GAUZE SPONGE 4X4 16PLY XRAY LF (GAUZE/BANDAGES/DRESSINGS) ×7 IMPLANT
GLOVE BIO SURGEON STRL SZ7.5 (GLOVE) ×35 IMPLANT
GLOVE BIO SURGEON STRL SZ8 (GLOVE) ×7 IMPLANT
GLOVE BIOGEL PI IND STRL 7.5 (GLOVE) ×5 IMPLANT
GLOVE BIOGEL PI IND STRL 8 (GLOVE) ×5 IMPLANT
GLOVE BIOGEL PI INDICATOR 7.5 (GLOVE) ×2
GLOVE BIOGEL PI INDICATOR 8 (GLOVE) ×2
GOWN STRL REUS W/ TWL LRG LVL3 (GOWN DISPOSABLE) ×40 IMPLANT
GOWN STRL REUS W/ TWL XL LVL3 (GOWN DISPOSABLE) ×10 IMPLANT
GOWN STRL REUS W/TWL LRG LVL3 (GOWN DISPOSABLE) ×16
GOWN STRL REUS W/TWL XL LVL3 (GOWN DISPOSABLE) ×4
HANDPIECE INTERPULSE COAX TIP (DISPOSABLE) ×2
HOLDER TRACH TUBE VELCRO 19.5 (MISCELLANEOUS) ×7 IMPLANT
KIT BASIN OR (CUSTOM PROCEDURE TRAY) ×21 IMPLANT
KIT ROOM TURNOVER OR (KITS) ×21 IMPLANT
KIT SUCTION CATH 14FR (SUCTIONS) IMPLANT
MANIFOLD NEPTUNE II (INSTRUMENTS) ×14 IMPLANT
NEEDLE HYPO 25GX1X1/2 BEV (NEEDLE) ×14 IMPLANT
NEEDLE PRECISIONGLIDE 27X1.5 (NEEDLE) IMPLANT
NS IRRIG 1000ML POUR BTL (IV SOLUTION) ×21 IMPLANT
PACK EENT II TURBAN DRAPE (CUSTOM PROCEDURE TRAY) ×7 IMPLANT
PACK ORTHO EXTREMITY (CUSTOM PROCEDURE TRAY) ×7 IMPLANT
PAD ARMBOARD 7.5X6 YLW CONV (MISCELLANEOUS) ×35 IMPLANT
PADDING CAST COTTON 6X4 STRL (CAST SUPPLIES) ×7 IMPLANT
PENCIL BUTTON HOLSTER BLD 10FT (ELECTRODE) ×14 IMPLANT
PIN 4X100X20MM (EXFIX) ×14 IMPLANT
SCREW UPPER FACE 2.0X12MM (Screw) ×14 IMPLANT
SCREW UPPER FACE 2.0X8MM (Screw) ×14 IMPLANT
SET HNDPC FAN SPRY TIP SCT (DISPOSABLE) ×5 IMPLANT
SPONGE DRAIN TRACH 4X4 STRL 2S (GAUZE/BANDAGES/DRESSINGS) ×7 IMPLANT
SPONGE INTESTINAL PEANUT (DISPOSABLE) IMPLANT
SPONGE LAP 18X18 X RAY DECT (DISPOSABLE) ×7 IMPLANT
STAPLER VISISTAT 35W (STAPLE) ×7 IMPLANT
STOCKINETTE IMPERVIOUS 9X36 MD (GAUZE/BANDAGES/DRESSINGS) ×7 IMPLANT
SUCTION FRAZIER HANDLE 10FR (MISCELLANEOUS)
SUCTION TUBE FRAZIER 10FR DISP (MISCELLANEOUS) IMPLANT
SUT CHROMIC 3 0 PS 2 (SUTURE) IMPLANT
SUT CHROMIC 4 0 PS 2 18 (SUTURE) IMPLANT
SUT ETHILON 3 0 PS 1 (SUTURE) ×14 IMPLANT
SUT MNCRL AB 3-0 PS2 18 (SUTURE) ×7 IMPLANT
SUT MON AB 2-0 CT1 36 (SUTURE) ×14 IMPLANT
SUT PDS AB 2-0 CT1 27 (SUTURE) IMPLANT
SUT SILK 0 FSL (SUTURE) ×7 IMPLANT
SUT SILK 2 0 REEL (SUTURE) IMPLANT
SUT SILK 2 0 SH CR/8 (SUTURE) ×7 IMPLANT
SUT VIC AB 2-0 FS1 27 (SUTURE) IMPLANT
SWAB CULTURE ESWAB REG 1ML (MISCELLANEOUS) IMPLANT
SYR 20ML ECCENTRIC (SYRINGE) ×7 IMPLANT
SYR BULB 3OZ (MISCELLANEOUS) IMPLANT
SYR CONTROL 10ML LL (SYRINGE) IMPLANT
TOWEL OR 17X24 6PK STRL BLUE (TOWEL DISPOSABLE) ×14 IMPLANT
TOWEL OR 17X26 10 PK STRL BLUE (TOWEL DISPOSABLE) ×14 IMPLANT
TRAY ENT MC OR (CUSTOM PROCEDURE TRAY) ×7 IMPLANT
TUBE CONNECTING 12'X1/4 (SUCTIONS) ×1
TUBE CONNECTING 12X1/4 (SUCTIONS) ×6 IMPLANT
TUBE TRACH SHILEY 8 DIST CUF (TUBING) ×7 IMPLANT
UNDERPAD 30X30 (UNDERPADS AND DIAPERS) ×7 IMPLANT
YANKAUER SUCT BULB TIP NO VENT (SUCTIONS) ×7 IMPLANT

## 2017-05-07 NOTE — Progress Notes (Signed)
Pt intubated and sedated  Family at bedside today  EXAM:  BP (!) 109/59   Pulse 78   Temp 99.1 F (37.3 C) (Axillary)   Resp 16   Ht 5\' 6"  (1.676 m)   Wt 101.9 kg (224 lb 10.4 oz)   SpO2 100%   BMI 36.26 kg/m   Intubated Sedated  PLAN: MRI C Spine ordered. NCN to review
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
## 2017-05-07 NOTE — Op Note (Signed)
[facility] Patient Name: [name] Procedure Date : 05/07/2017 MRN: [id number] Attending MD: Georganna Skeans , MD Date of Birth: 1965-12-09 CSN: [id number] Age: 51 Admit Type: Inpatient Procedure:                Upper GI endoscopy Indications:              Place PEG because patient is unable to eat due to                            intracranial injury Providers:                Georganna Skeans, MD, Jackson Latino, PAS;Marcene Duos, Technician, Alan Mulder, Technician, Elna Breslow, RN Referring MD:              Medicines:                General Anesthesia Complications:            No immediate complications. Estimated Blood Loss:     Estimated blood loss: none. Procedure:                Pre-Anesthesia Assessment:                           - Prior to the procedure, a History and Physical                            was performed, and patient medications and                            allergies were reviewed. The patient is unable to                            give consent secondary to the patient's altered                            mental status. The risks and benefits of the                            procedure and the sedation options and risks were                            discussed with the patient's spouse. All questions                            were answered and informed consent was obtained.                            Patient identification and proposed procedure were                            verified  by the physician, the nurse, the                            anesthetist and the technician in the procedure                            room. Mental Status Examination: sedated. Airway                            Examination: orotracheal intubation. Respiratory                            Examination: clear to auscultation. CV Examination:                            regular rate and rhythm. ASA Grade  Assessment: III                            - A patient with severe systemic disease. After                            reviewing the risks and benefits, the patient was                            deemed in satisfactory condition to undergo the                            procedure. The anesthesia plan was to use general                            anesthesia. Immediately prior to administration of                            medications, the patient was re-assessed for                            adequacy to receive sedatives. The heart rate,                            respiratory rate, oxygen saturations, blood                            pressure, adequacy of pulmonary ventilation, and                            response to care were monitored throughout the                            procedure. The physical status of the patient was                            re-assessed after the procedure.  After obtaining informed consent, the endoscope was                            passed under direct vision. Throughout the                            procedure, the patient's blood pressure, pulse, and                            oxygen saturations were monitored continuously. The                            was introduced through the mouth, and advanced to                            the duodenal bulb. The upper GI endoscopy was                            accomplished without difficulty. The patient                            tolerated the procedure well. Scope In: Scope Out: Findings:      No gross lesions were noted in the entire esophagus.      No gross lesions were noted in the stomach. Placement of an externally       removable PEG with no T-fasteners was successfully completed. The       external bumper was at the 3.5 cm marking on the tube.      No gross lesions were noted in the duodenal bulb. Impression:               - No gross lesions in esophagus.                            - No gross lesions in the stomach.                           - No gross lesions in the duodenal bulb.                           - An externally removable PEG placement was                            successfully completed.                           - No specimens collected. Recommendation:           - Please follow the post-PEG recommendations                            including: start using PEG today. Procedure Code(s):        --- Professional ---                           [phone], Esophagogastroduodenoscopy, flexible,  transoral; with directed placement of percutaneous                            gastrostomy tube Diagnosis Code(s):        --- Professional ---                           R63.3, Feeding difficulties                           S06.9X0S, Unspecified intracranial injury without                            loss of consciousness, sequela                           Z43.1, Encounter for attention to gastrostomy CPT copyright 2016 [employer]. All rights reserved. The codes documented in this report are preliminary and upon coder review may  be revised to meet current compliance requirements. Georganna Skeans, MD 05/07/2017 2:21:27 PM This report has been signed electronically. Number of Addenda: 0

## 2017-05-07 NOTE — Anesthesia Preprocedure Evaluation (Signed)
Anesthesia Evaluation  Patient identified by MRN, date of birth, ID band Patient unresponsive    Reviewed: Allergy & Precautions, Patient's Chart, lab work & pertinent test results  Airway Mallampati: Intubated       Dental no notable dental hx.    Pulmonary  Hypoxic respiratory failure   Pulmonary exam normal breath sounds clear to auscultation       Cardiovascular negative cardio ROS Normal cardiovascular exam Rhythm:Regular Rate:Normal     Neuro/Psych TBI quadraparesis negative psych ROS   GI/Hepatic negative GI ROS, Neg liver ROS,   Endo/Other  negative endocrine ROS  Renal/GU negative Renal ROS     Musculoskeletal negative musculoskeletal ROS (+)   Abdominal   Peds  Hematology  (+) anemia ,   Anesthesia Other Findings   Reproductive/Obstetrics                             Lab Results  Component Value Date   WBC 6.8 05/07/2017   HGB 7.8 (L) 05/07/2017   HCT 24.1 (L) 05/07/2017   MCV 86.1 05/07/2017   PLT 143 (L) 05/07/2017   Lab Results  Component Value Date   CREATININE 0.65 05/07/2017   BUN 14 05/07/2017   NA 143 05/07/2017   K 3.9 05/07/2017   CL 108 05/07/2017   CO2 28 05/07/2017    Anesthesia Physical  Anesthesia Plan  ASA: IV  Anesthesia Plan: General   Post-op Pain Management:    Induction: Inhalational  PONV Risk Score and Plan: 3 and Ondansetron, Dexamethasone and Propofol infusion  Airway Management Planned: Oral ETT  Additional Equipment:   Intra-op Plan:   Post-operative Plan: Post-operative intubation/ventilation  Informed Consent: I have reviewed the patients History and Physical, chart, labs and discussed the procedure including the risks, benefits and alternatives for the proposed anesthesia with the patient or authorized representative who has indicated his/her understanding and acceptance.   Dental advisory given  Plan Discussed  with: CRNA  Anesthesia Plan Comments:         Anesthesia Quick Evaluation

## 2017-05-07 NOTE — Progress Notes (Signed)
Orthopedic Tech Progress Note Patient Details:  Vincent DINEEN Sr. June 14, 1966 [id number]  Ortho Devices Type of Ortho Device: CAM walker Ortho Device/Splint Interventions: [name] 05/07/2017, 3:39 PM
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
## 2017-05-07 NOTE — Transfer of Care (Signed)
Immediate Anesthesia Transfer of Care Note  Patient: Vincent BUDA Sr.  Procedure(s) Performed: Procedure(s): IRRIGATION AND DEBRIDEMENT, LEFT TIBIA  EX-FIX ADJUSTMENT (Left) ESOPHAGOGASTRODUODENOSCOPY (EGD) (N/A) PERCUTANEOUS ENDOSCOPIC GASTROSTOMY (PEG) PLACEMENT (N/A) TRACHEOSTOMY (N/A) CLOSED REDUCTION MANDIBULAR (N/A)  Patient Location: ICU  Anesthesia Type:General  Level of Consciousness: Patient remains intubated per anesthesia plan  Airway & Oxygen Therapy: Patient remains intubated per anesthesia plan and Patient placed on Ventilator (see vital sign flow sheet for setting)  Post-op Assessment: Report given to RN and Post -op Vital signs reviewed and stable  Post vital signs: Reviewed and stable  Last Vitals:  Vitals:   05/07/17 1142 05/07/17 1200  BP: (!) 109/59 113/63  Pulse: 78 81  Resp: 16 16  Temp:  37.3 C  SpO2: 100% 98%    Last Pain:  Vitals:   05/07/17 1200  TempSrc: Axillary         Complications: No apparent anesthesia complications

## 2017-05-08 ENCOUNTER — Other Ambulatory Visit: Payer: Self-pay | Admitting: Neurosurgery

## 2017-05-08 ENCOUNTER — Encounter (HOSPITAL_COMMUNITY): Payer: Self-pay | Admitting: *Deleted

## 2017-05-08 ENCOUNTER — Inpatient Hospital Stay (HOSPITAL_COMMUNITY): Payer: Medicaid Other

## 2017-05-08 LAB — CULTURE, RESPIRATORY W GRAM STAIN

## 2017-05-08 LAB — CBC
HCT: 24.8 % — ABNORMAL LOW (ref 39.0–52.0)
Hemoglobin: 7.8 g/dL — ABNORMAL LOW (ref 13.0–17.0)
MCH: 27.3 pg (ref 26.0–34.0)
MCHC: 31.5 g/dL (ref 30.0–36.0)
MCV: 86.7 fL (ref 78.0–100.0)
PLATELETS: 189 10*3/uL (ref 150–400)
RBC: 2.86 MIL/uL — ABNORMAL LOW (ref 4.22–5.81)
RDW: 15.2 % (ref 11.5–15.5)
WBC: 7.3 10*3/uL (ref 4.0–10.5)

## 2017-05-08 LAB — GLUCOSE, CAPILLARY
GLUCOSE-CAPILLARY: 125 mg/dL — AB (ref 65–99)
GLUCOSE-CAPILLARY: 117 mg/dL — AB (ref 65–99)
GLUCOSE-CAPILLARY: 138 mg/dL — AB (ref 65–99)
GLUCOSE-CAPILLARY: 146 mg/dL — AB (ref 65–99)
GLUCOSE-CAPILLARY: 145 mg/dL — AB (ref 65–99)
Glucose-Capillary: 145 mg/dL — ABNORMAL HIGH (ref 65–99)
Glucose-Capillary: 158 mg/dL — ABNORMAL HIGH (ref 65–99)

## 2017-05-08 LAB — BASIC METABOLIC PANEL
Anion gap: 7 (ref 5–15)
BUN: 13 mg/dL (ref 6–20)
CO2: 30 mmol/L (ref 22–32)
CREATININE: 0.68 mg/dL (ref 0.61–1.24)
Calcium: 8.1 mg/dL — ABNORMAL LOW (ref 8.9–10.3)
Chloride: 107 mmol/L (ref 101–111)
GFR calc Af Amer: >60 mL/min (ref >60)
Glucose, Bld: 130 mg/dL — ABNORMAL HIGH (ref 65–99)
Potassium: 3.8 mmol/L (ref 3.5–5.1)
SODIUM: 144 mmol/L (ref 135–145)

## 2017-05-08 LAB — CULTURE, RESPIRATORY

## 2017-05-08 MED ORDER — SODIUM CHLORIDE 0.9% FLUSH
10.0000 mL | INTRAVENOUS | Status: DC | PRN
  Administered 2017-05-18: 10 mL
  Filled 2017-05-08: qty 40

## 2017-05-08 MED ORDER — SODIUM CHLORIDE 0.9% FLUSH
10.0000 mL | Freq: Two times a day (BID) | INTRAVENOUS | Status: DC
  Administered 2017-05-09: 20 mL
  Administered 2017-05-09 – 2017-05-10 (×2): 10 mL
  Administered 2017-05-10: 20 mL
  Administered 2017-05-10 – 2017-05-11 (×3): 10 mL
  Administered 2017-05-12: 20 mL
  Administered 2017-05-12 – 2017-05-13 (×2): 10 mL
  Administered 2017-05-13: 20 mL
  Administered 2017-05-14 – 2017-05-20 (×9): 10 mL

## 2017-05-08 MED ORDER — PANTOPRAZOLE SODIUM 40 MG PO PACK
40.0000 mg | PACK | Freq: Every day | ORAL | Status: DC
  Administered 2017-05-09 – 2017-05-21 (×12): 40 mg
  Filled 2017-05-08 (×12): qty 20

## 2017-05-08 MED ORDER — CEFEPIME HCL 2 G IJ SOLR
2.0000 g | Freq: Two times a day (BID) | INTRAMUSCULAR | Status: AC
  Administered 2017-05-08 – 2017-05-15 (×16): 2 g via INTRAVENOUS
  Filled 2017-05-08 (×16): qty 2

## 2017-05-08 MED ORDER — CHLORHEXIDINE GLUCONATE CLOTH 2 % EX PADS
6.0000 | MEDICATED_PAD | Freq: Every day | CUTANEOUS | Status: DC
  Administered 2017-05-09 – 2017-05-18 (×9): 6 via TOPICAL
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
## 2017-05-08 NOTE — Progress Notes (Signed)
   Subjective:    Patient ID: [name]., male    DOB: 07/23/1966, 51 y.o.   MRN: [id number]  HPI Remains on mechanical ventilator.  Not breathing much over ventilator.  Opening eyes.  No problems with trach tube per nursing.   Review of Systems     Objective:   Physical Exam Opens eyes to touch. Hard cervical collar in place.  Trach tube in place with cuff inflated, no bleeding. MMF wires in place.     Assessment & Plan:  Left angle mandible fracture, tongue laceration, and respiratory failure.  Lurline Idol site looks good.  Continue cuffed tube until ventilator no longer needed.  Mandible in tight fixation.  Plan to leave wires in place for four weeks.
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
## 2017-05-08 NOTE — Anesthesia Postprocedure Evaluation (Signed)
Anesthesia Post Note  Patient: Vincent POLLITT Sr.  Procedure(s) Performed: Procedure(s) (LRB): IRRIGATION AND DEBRIDEMENT, LEFT TIBIA  EX-FIX ADJUSTMENT (Left) ESOPHAGOGASTRODUODENOSCOPY (EGD) (N/A) PERCUTANEOUS ENDOSCOPIC GASTROSTOMY (PEG) PLACEMENT (N/A) TRACHEOSTOMY (N/A) CLOSED REDUCTION MANDIBULAR (N/A)     Patient location during evaluation: SICU Anesthesia Type: General Level of consciousness: sedated Pain management: pain level controlled Vital Signs Assessment: post-procedure vital signs reviewed and stable Respiratory status: patient remains intubated per anesthesia plan Cardiovascular status: stable Anesthetic complications: no    Last Vitals:  Vitals:   05/08/17 1541 05/08/17 1600  BP: (!) 141/76   Pulse: 87   Resp: 16   Temp:  (!) 38 C  SpO2: 100%     Last Pain:  Vitals:   05/08/17 1600  TempSrc: Axillary   Pain Goal:                 Tiajuana Amass

## 2017-05-08 NOTE — Progress Notes (Signed)
Peripherally Inserted Central Catheter/Midline Placement  The IV Nurse has discussed with the patient and/or persons authorized to consent for the patient, the purpose of this procedure and the potential benefits and risks involved with this procedure.  The benefits include less needle sticks, lab draws from the catheter, and the patient may be discharged home with the catheter. Risks include, but not limited to, infection, bleeding, blood clot (thrombus formation), and puncture of an artery; nerve damage and irregular heartbeat and possibility to perform a PICC exchange if needed/ordered by physician.  Alternatives to this procedure were also discussed.  Bard Power PICC patient education guide, fact sheet on infection prevention and patient information card has been provided to patient /or left at bedside.    PICC/Midline Placement Documentation  PICC Double Lumen 82/80/03 PICC Right Basilic 38 cm 0 cm (Active)  Indication for Insertion or Continuance of Line Prolonged intravenous therapies 05/08/2017 10:24 PM  Exposed Catheter (cm) 0 cm 05/08/2017 10:24 PM  Site Assessment Clean;Dry;Intact 05/08/2017 10:24 PM  Lumen #1 Status Flushed;Saline locked;Blood return noted 05/08/2017 10:24 PM  Lumen #2 Status Flushed;Saline locked;Blood return noted 05/08/2017 10:24 PM  Dressing Type Transparent 05/08/2017 10:24 PM  Dressing Status Clean;Dry;Intact 05/08/2017 10:24 PM  Dressing Change Due 05/15/17 05/08/2017 10:24 PM       Gordan Payment 05/08/2017, 10:25 PM

## 2017-05-09 ENCOUNTER — Encounter (HOSPITAL_COMMUNITY): Payer: Self-pay | Admitting: Student

## 2017-05-09 LAB — CBC
HCT: 20.6 % — ABNORMAL LOW (ref 39.0–52.0)
HEMATOCRIT: 27.5 % — AB (ref 39.0–52.0)
HEMOGLOBIN: 8.9 g/dL — AB (ref 13.0–17.0)
Hemoglobin: 6.6 g/dL — CL (ref 13.0–17.0)
MCH: 27.7 pg (ref 26.0–34.0)
MCH: 28.3 pg (ref 26.0–34.0)
MCHC: 32 g/dL (ref 30.0–36.0)
MCHC: 32.4 g/dL (ref 30.0–36.0)
MCV: 86.6 fL (ref 78.0–100.0)
MCV: 87.3 fL (ref 78.0–100.0)
PLATELETS: 181 10*3/uL (ref 150–400)
Platelets: 229 10*3/uL (ref 150–400)
RBC: 2.38 MIL/uL — ABNORMAL LOW (ref 4.22–5.81)
RBC: 3.15 MIL/uL — AB (ref 4.22–5.81)
RDW: 15.6 % — AB (ref 11.5–15.5)
RDW: 15.5 % (ref 11.5–15.5)
WBC: 7.5 10*3/uL (ref 4.0–10.5)
WBC: 9.2 10*3/uL (ref 4.0–10.5)

## 2017-05-09 LAB — BASIC METABOLIC PANEL
Anion gap: 4 — ABNORMAL LOW (ref 5–15)
BUN: 11 mg/dL (ref 6–20)
CALCIUM: 6.5 mg/dL — AB (ref 8.9–10.3)
CO2: 23 mmol/L (ref 22–32)
Chloride: 118 mmol/L — ABNORMAL HIGH (ref 101–111)
Creatinine, Ser: 0.45 mg/dL — ABNORMAL LOW (ref 0.61–1.24)
GFR calc Af Amer: >60 mL/min (ref >60)
GLUCOSE: 123 mg/dL — AB (ref 65–99)
Potassium: 3.8 mmol/L (ref 3.5–5.1)
Sodium: 145 mmol/L (ref 135–145)

## 2017-05-09 LAB — GLUCOSE, CAPILLARY
GLUCOSE-CAPILLARY: 145 mg/dL — AB (ref 65–99)
GLUCOSE-CAPILLARY: 141 mg/dL — AB (ref 65–99)
GLUCOSE-CAPILLARY: 157 mg/dL — AB (ref 65–99)
Glucose-Capillary: 131 mg/dL — ABNORMAL HIGH (ref 65–99)
Glucose-Capillary: 127 mg/dL — ABNORMAL HIGH (ref 65–99)
Glucose-Capillary: 116 mg/dL — ABNORMAL HIGH (ref 65–99)

## 2017-05-09 LAB — PREPARE RBC (CROSSMATCH)

## 2017-05-09 MED ORDER — BISACODYL 10 MG RE SUPP
10.0000 mg | Freq: Every day | RECTAL | Status: DC
  Administered 2017-05-09 – 2017-05-21 (×10): 10 mg via RECTAL
  Filled 2017-05-09 (×10): qty 1

## 2017-05-09 MED ORDER — ADULT MULTIVITAMIN LIQUID CH
15.0000 mL | Freq: Every day | ORAL | Status: DC
  Administered 2017-05-09 – 2017-05-21 (×12): 15 mL
  Filled 2017-05-09 (×12): qty 15

## 2017-05-09 MED ORDER — HYDROCODONE-ACETAMINOPHEN 7.5-325 MG/15ML PO SOLN
10.0000 mL | ORAL | Status: DC | PRN
  Administered 2017-05-09 – 2017-05-20 (×13): 10 mL
  Filled 2017-05-09 (×13): qty 15

## 2017-05-09 MED ORDER — SODIUM CHLORIDE 0.9 % IV SOLN
Freq: Once | INTRAVENOUS | Status: AC
  Administered 2017-05-09: 06:00:00 via INTRAVENOUS

## 2017-05-09 MED ORDER — FLEET ENEMA 7-19 GM/118ML RE ENEM: 1.0000 | ENEMA | Freq: Every day | RECTAL | Status: DC | PRN

## 2017-05-09 MED ORDER — SODIUM CHLORIDE 0.9 % IV SOLN
Freq: Once | INTRAVENOUS | Status: AC
  Administered 2017-05-09: 09:00:00 via INTRAVENOUS

## 2017-05-09 MED ORDER — POLYETHYLENE GLYCOL 3350 17 G PO PACK
17.0000 g | PACK | Freq: Every day | ORAL | Status: DC
  Administered 2017-05-09 – 2017-05-18 (×9): 17 g
  Filled 2017-05-09 (×11): qty 1

## 2017-05-09 NOTE — Progress Notes (Signed)
Patient ID: [name]., male   DOB: 08/21/1966, 51 y.o.   MRN: [id number] CBC pending I spoke with his family members at the bedside.  Georganna Skeans, MD, MPH, FACS Trauma: [phone] General Surgery: [phone]
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
## 2017-05-09 NOTE — Progress Notes (Signed)
141mls expired fentanyl wasted in sink, witnessed by Lianne Bushy, RN
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
## 2017-05-09 NOTE — Progress Notes (Signed)
CRITICAL VALUE ALERT  Critical Value:  Hgb 6.6  Date & Time Notied:  05/09/17 @ 0500  Provider Notified: Dr Grandville Silos  Orders Received/Actions taken: 2U PRBCs
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
## 2017-05-10 ENCOUNTER — Inpatient Hospital Stay (HOSPITAL_COMMUNITY): Payer: Medicaid Other

## 2017-05-10 ENCOUNTER — Inpatient Hospital Stay (HOSPITAL_COMMUNITY): Payer: Medicaid Other | Admitting: Anesthesiology

## 2017-05-10 ENCOUNTER — Encounter (HOSPITAL_COMMUNITY): Payer: Self-pay | Admitting: *Deleted

## 2017-05-10 ENCOUNTER — Encounter (HOSPITAL_COMMUNITY): Admission: EM | Disposition: A | Discharge status: Rehabilitation Facility | Payer: Self-pay | Source: Home / Self Care

## 2017-05-10 HISTORY — PX: POSTERIOR CERVICAL FUSION/FORAMINOTOMY: SHX5038

## 2017-05-10 LAB — CBC
HCT: 29.4 % — ABNORMAL LOW (ref 39.0–52.0)
Hemoglobin: 9.4 g/dL — ABNORMAL LOW (ref 13.0–17.0)
MCH: 28.1 pg (ref 26.0–34.0)
MCHC: 32 g/dL (ref 30.0–36.0)
MCV: 88 fL (ref 78.0–100.0)
PLATELETS: 245 10*3/uL (ref 150–400)
RBC: 3.34 MIL/uL — AB (ref 4.22–5.81)
RDW: 15.8 % — ABNORMAL HIGH (ref 11.5–15.5)
WBC: 9.8 10*3/uL (ref 4.0–10.5)

## 2017-05-10 LAB — TYPE AND SCREEN
ABO/RH(D): O POS
ANTIBODY SCREEN: NEGATIVE
UNIT DIVISION: 0
UNIT DIVISION: 0
Unit division: 0
Unit division: 0

## 2017-05-10 LAB — GLUCOSE, CAPILLARY
GLUCOSE-CAPILLARY: 114 mg/dL — AB (ref 65–99)
GLUCOSE-CAPILLARY: 116 mg/dL — AB (ref 65–99)
GLUCOSE-CAPILLARY: 117 mg/dL — AB (ref 65–99)
Glucose-Capillary: 115 mg/dL — ABNORMAL HIGH (ref 65–99)
Glucose-Capillary: 115 mg/dL — ABNORMAL HIGH (ref 65–99)

## 2017-05-10 LAB — BASIC METABOLIC PANEL
Anion gap: 5 (ref 5–15)
BUN: 18 mg/dL (ref 6–20)
CO2: 28 mmol/L (ref 22–32)
Calcium: 8.4 mg/dL — ABNORMAL LOW (ref 8.9–10.3)
Chloride: 111 mmol/L (ref 101–111)
Creatinine, Ser: 0.6 mg/dL — ABNORMAL LOW (ref 0.61–1.24)
GFR calc Af Amer: >60 mL/min (ref >60)
Glucose, Bld: 116 mg/dL — ABNORMAL HIGH (ref 65–99)
POTASSIUM: 3.9 mmol/L (ref 3.5–5.1)
SODIUM: 144 mmol/L (ref 135–145)

## 2017-05-10 LAB — BPAM RBC
BLOOD PRODUCT EXPIRATION DATE: 201809242359
Blood Product Expiration Date: 201809242359
Blood Product Expiration Date: 201810012359
Blood Product Expiration Date: 201810012359
ISSUE DATE / TIME: 201808300543
ISSUE DATE / TIME: 201808300822
ISSUE DATE / TIME: 201808310716
UNIT TYPE AND RH: 5100
UNIT TYPE AND RH: 5100
UNIT TYPE AND RH: 5100
Unit Type and Rh: 5100

## 2017-05-10 LAB — PREPARE RBC (CROSSMATCH)

## 2017-05-10 LAB — CULTURE, BLOOD (ROUTINE X 2)
Culture: NO GROWTH
Culture: NO GROWTH
SPECIAL REQUESTS: ADEQUATE

## 2017-05-10 SURGERY — POSTERIOR CERVICAL FUSION/FORAMINOTOMY LEVEL 4
Anesthesia: General | Site: Spine Cervical

## 2017-05-10 MED ORDER — FENTANYL CITRATE (PF) 250 MCG/5ML IJ SOLN
INTRAMUSCULAR | Status: AC
  Filled 2017-05-10: qty 5

## 2017-05-10 MED ORDER — HEMOSTATIC AGENTS (NO CHARGE) OPTIME
TOPICAL | Status: DC | PRN
  Administered 2017-05-10: 1 via TOPICAL

## 2017-05-10 MED ORDER — THROMBIN 5000 UNITS EX SOLR
CUTANEOUS | Status: AC
Start: 2017-05-10 — End: 2017-05-10
  Filled 2017-05-10: qty 5000

## 2017-05-10 MED ORDER — LACTATED RINGERS IV SOLN
INTRAVENOUS | Status: DC | PRN
  Administered 2017-05-10 (×2): via INTRAVENOUS

## 2017-05-10 MED ORDER — FENTANYL CITRATE (PF) 250 MCG/5ML IJ SOLN
INTRAMUSCULAR | Status: DC | PRN
  Administered 2017-05-10: 250 ug via INTRAVENOUS
  Administered 2017-05-10: 150 ug via INTRAVENOUS
  Administered 2017-05-10: 50 ug via INTRAVENOUS
  Administered 2017-05-10 (×2): 100 ug via INTRAVENOUS
  Administered 2017-05-10: 250 ug via INTRAVENOUS

## 2017-05-10 MED ORDER — BUPIVACAINE HCL (PF) 0.5 % IJ SOLN
INTRAMUSCULAR | Status: DC | PRN
  Administered 2017-05-10: 5 mL

## 2017-05-10 MED ORDER — ALBUMIN HUMAN 5 % IV SOLN
INTRAVENOUS | Status: DC | PRN
  Administered 2017-05-10 (×2): via INTRAVENOUS

## 2017-05-10 MED ORDER — THROMBIN 20000 UNITS EX SOLR
CUTANEOUS | Status: DC | PRN
  Administered 2017-05-10: 17:00:00 via TOPICAL

## 2017-05-10 MED ORDER — SUGAMMADEX SODIUM 200 MG/2ML IV SOLN
INTRAVENOUS | Status: AC
  Filled 2017-05-10: qty 2

## 2017-05-10 MED ORDER — ROCURONIUM BROMIDE 10 MG/ML (PF) SYRINGE
PREFILLED_SYRINGE | INTRAVENOUS | Status: AC
  Filled 2017-05-10: qty 15

## 2017-05-10 MED ORDER — DEXMEDETOMIDINE HCL IN NACL 200 MCG/50ML IV SOLN
INTRAVENOUS | Status: AC
  Filled 2017-05-10: qty 50

## 2017-05-10 MED ORDER — MIDAZOLAM HCL 5 MG/5ML IJ SOLN
INTRAMUSCULAR | Status: DC | PRN
  Administered 2017-05-10 (×2): 2 mg via INTRAVENOUS

## 2017-05-10 MED ORDER — LIDOCAINE 2% (20 MG/ML) 5 ML SYRINGE
INTRAMUSCULAR | Status: AC
  Filled 2017-05-10: qty 10

## 2017-05-10 MED ORDER — ONDANSETRON HCL 4 MG/2ML IJ SOLN
INTRAMUSCULAR | Status: AC
  Filled 2017-05-10: qty 2

## 2017-05-10 MED ORDER — THROMBIN 5000 UNITS EX SOLR
CUTANEOUS | Status: DC | PRN
  Administered 2017-05-10 (×2): via TOPICAL

## 2017-05-10 MED ORDER — MIDAZOLAM HCL 2 MG/2ML IJ SOLN
INTRAMUSCULAR | Status: AC
  Filled 2017-05-10: qty 2

## 2017-05-10 MED ORDER — THROMBIN 20000 UNITS EX SOLR
CUTANEOUS | Status: AC
  Filled 2017-05-10: qty 20000

## 2017-05-10 MED ORDER — SODIUM CHLORIDE 0.9 % IR SOLN
Status: DC | PRN
  Administered 2017-05-10: 17:00:00

## 2017-05-10 MED ORDER — DEXMEDETOMIDINE HCL 200 MCG/2ML IV SOLN
INTRAVENOUS | Status: DC | PRN
  Administered 2017-05-10 (×2): 12 ug via INTRAVENOUS

## 2017-05-10 MED ORDER — BACITRACIN ZINC 500 UNIT/GM EX OINT
TOPICAL_OINTMENT | CUTANEOUS | Status: DC | PRN
  Administered 2017-05-10: 1 via TOPICAL

## 2017-05-10 MED ORDER — THROMBIN 5000 UNITS EX SOLR
CUTANEOUS | Status: AC
  Filled 2017-05-10: qty 5000

## 2017-05-10 MED ORDER — ROCURONIUM BROMIDE 100 MG/10ML IV SOLN
INTRAVENOUS | Status: DC | PRN
  Administered 2017-05-10: 50 mg via INTRAVENOUS
  Administered 2017-05-10: 100 mg via INTRAVENOUS
  Administered 2017-05-10 (×5): 50 mg via INTRAVENOUS

## 2017-05-10 MED ORDER — BACITRACIN ZINC 500 UNIT/GM EX OINT
TOPICAL_OINTMENT | CUTANEOUS | Status: AC
  Filled 2017-05-10: qty 28.35

## 2017-05-10 MED ORDER — LIDOCAINE-EPINEPHRINE 1 %-1:100000 IJ SOLN
INTRAMUSCULAR | Status: DC | PRN
Start: 2017-05-10 — End: 2017-05-10
  Administered 2017-05-10: 5 mL

## 2017-05-10 MED ORDER — BUPIVACAINE HCL (PF) 0.5 % IJ SOLN
INTRAMUSCULAR | Status: AC
  Filled 2017-05-10: qty 30

## 2017-05-10 MED ORDER — SODIUM CHLORIDE 0.9 % IV SOLN: Freq: Once | INTRAVENOUS | Status: DC

## 2017-05-10 MED ORDER — LIDOCAINE-EPINEPHRINE 1 %-1:100000 IJ SOLN
INTRAMUSCULAR | Status: AC
  Filled 2017-05-10: qty 1

## 2017-05-10 SURGICAL SUPPLY — 73 items
BAG DECANTER FOR FLEXI CONT (MISCELLANEOUS) ×3 IMPLANT
BENZOIN TINCTURE PRP APPL 2/3 (GAUZE/BANDAGES/DRESSINGS) ×6 IMPLANT
BIT DRILL 2.4 (BIT) ×2 IMPLANT
BIT DRILL 2.4MM (BIT) ×1
BIT DRILL OC (BIT) ×3 IMPLANT
BLADE CLIPPER SURG (BLADE) ×3 IMPLANT
BLADE ULTRA TIP 2M (BLADE) IMPLANT
BUR MATCHSTICK NEURO 3.0 LAGG (BURR) ×3 IMPLANT
BUR PRECISION FLUTE 5.0 (BURR) ×3 IMPLANT
CANISTER SUCT 3000ML PPV (MISCELLANEOUS) ×3 IMPLANT
CARTRIDGE OIL MAESTRO DRILL (MISCELLANEOUS) ×1 IMPLANT
CLOSURE WOUND 1/2 X4 (GAUZE/BANDAGES/DRESSINGS)
CONNECTOR OC EYELET OFFSET (Connector) ×6 IMPLANT
CONNECTOR OC EYELET STANDARD (Connector) ×12 IMPLANT
CONNECTOR OC EYELET STD (Connector) ×4 IMPLANT
DIFFUSER DRILL AIR PNEUMATIC (MISCELLANEOUS) ×3 IMPLANT
DRAPE C-ARM 42X72 X-RAY (DRAPES) ×6 IMPLANT
DRAPE LAPAROTOMY 100X72 PEDS (DRAPES) ×3 IMPLANT
DRAPE POUCH INSTRU U-SHP 10X18 (DRAPES) ×3 IMPLANT
DRSG OPSITE POSTOP 4X8 (GAUZE/BANDAGES/DRESSINGS) ×3 IMPLANT
DURAPREP 6ML APPLICATOR 50/CS (WOUND CARE) ×3 IMPLANT
ELECT REM PT RETURN 9FT ADLT (ELECTROSURGICAL) ×3
ELECTRODE REM PT RTRN 9FT ADLT (ELECTROSURGICAL) ×1 IMPLANT
FORCEPS BIPOLAR SPETZLER 8 1.0 (NEUROSURGERY SUPPLIES) ×3 IMPLANT
GAUZE SPONGE 4X4 12PLY STRL (GAUZE/BANDAGES/DRESSINGS) IMPLANT
GAUZE SPONGE 4X4 16PLY XRAY LF (GAUZE/BANDAGES/DRESSINGS) IMPLANT
GLOVE BIO SURGEON STRL SZ7 (GLOVE) IMPLANT
GLOVE BIOGEL PI IND STRL 7.0 (GLOVE) ×2 IMPLANT
GLOVE BIOGEL PI IND STRL 7.5 (GLOVE) ×3 IMPLANT
GLOVE BIOGEL PI INDICATOR 7.0 (GLOVE) ×4
GLOVE BIOGEL PI INDICATOR 7.5 (GLOVE) ×6
GLOVE ECLIPSE 7.0 STRL STRAW (GLOVE) ×3 IMPLANT
GOWN STRL REUS W/ TWL LRG LVL3 (GOWN DISPOSABLE) ×4 IMPLANT
GOWN STRL REUS W/ TWL XL LVL3 (GOWN DISPOSABLE) ×2 IMPLANT
GOWN STRL REUS W/TWL LRG LVL3 (GOWN DISPOSABLE) ×8
GOWN STRL REUS W/TWL XL LVL3 (GOWN DISPOSABLE) ×4
GRAFT BN 5X1XSPNE CVD POST DBM (Bone Implant) ×1 IMPLANT
GRAFT BONE MAGNIFUSE 1X5CM (Bone Implant) ×2 IMPLANT
HEMOSTAT POWDER KIT SURGIFOAM (HEMOSTASIS) ×6 IMPLANT
KIT BASIN OR (CUSTOM PROCEDURE TRAY) ×3 IMPLANT
KIT INFUSE SMALL (Orthopedic Implant) ×3 IMPLANT
KIT ROOM TURNOVER OR (KITS) ×3 IMPLANT
NEEDLE HYPO 21X1.5 ECLIPSE (NEEDLE) ×3 IMPLANT
NEEDLE HYPO 25X1 1.5 SAFETY (NEEDLE) ×3 IMPLANT
NS IRRIG 1000ML POUR BTL (IV SOLUTION) ×3 IMPLANT
OIL CARTRIDGE MAESTRO DRILL (MISCELLANEOUS) ×3
PACK LAMINECTOMY NEURO (CUSTOM PROCEDURE TRAY) ×3 IMPLANT
PAD ARMBOARD 7.5X6 YLW CONV (MISCELLANEOUS) ×9 IMPLANT
PIN MAYFIELD SKULL DISP (PIN) ×3 IMPLANT
PUTTY DBM GRAFTON 5CC (Putty) ×3 IMPLANT
ROD OCCIP 3.5X200M CCM PRE CVD (Rod) ×6 IMPLANT
SCREW 4.5X6MM OC (Screw) ×12 IMPLANT
SCREW BN 6X4.5XNS LF SPNE OC (Screw) ×5 IMPLANT
SCREW BN 6X5XNS LF SPNE OC (Screw) ×1 IMPLANT
SCREW MULTI AXIAL 3.5X14MM (Screw) ×6 IMPLANT
SCREW MULTI AXIAL 3.5X18MM (Screw) ×8 IMPLANT
SCREW MULTI AXIAL 3.5X26MM (Screw) ×4 IMPLANT
SCREW SET 3600315 STANDARD (Screw) ×18 IMPLANT
SCREW SET 3600315 STD (Screw) ×6 IMPLANT
SEALANT ADHERUS EXTEND TIP (MISCELLANEOUS) ×3 IMPLANT
SPONGE LAP 4X18 X RAY DECT (DISPOSABLE) IMPLANT
SPONGE SURGIFOAM ABS GEL 100 (HEMOSTASIS) ×3 IMPLANT
STAPLER SKIN PROX WIDE 3.9 (STAPLE) ×3 IMPLANT
STRIP CLOSURE SKIN 1/2X4 (GAUZE/BANDAGES/DRESSINGS) IMPLANT
SUT NURALON 4 0 TF (SUTURE) ×3 IMPLANT
SUT VIC AB 0 CT1 18XCR BRD8 (SUTURE) ×1 IMPLANT
SUT VIC AB 0 CT1 8-18 (SUTURE) ×2
SUT VIC AB 2-0 CT1 18 (SUTURE) ×3 IMPLANT
SUT VICRYL 3-0 RB1 18 ABS (SUTURE) ×3 IMPLANT
TOWEL GREEN STERILE (TOWEL DISPOSABLE) ×3 IMPLANT
TOWEL GREEN STERILE FF (TOWEL DISPOSABLE) ×3 IMPLANT
UNDERPAD 30X30 (UNDERPADS AND DIAPERS) ×3 IMPLANT
WATER STERILE IRR 1000ML POUR (IV SOLUTION) ×3 IMPLANT
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
## 2017-05-10 NOTE — Transfer of Care (Signed)
Immediate Anesthesia Transfer of Care Note  Patient: Vincent BOYAR Sr.  Procedure(s) Performed: Procedure(s): POSTERIOR CERVICAL FUSION OCCIPUT- CERVICAL FOUR (N/A)  Patient Location: ICU  Anesthesia Type:General  Level of Consciousness: unresponsive  Airway & Oxygen Therapy: Patient placed on Ventilator (see vital sign flow sheet for setting)  Post-op Assessment: Report given to RN and Post -op Vital signs reviewed and stable  Post vital signs: Reviewed and stable  Last Vitals:  Vitals:   05/10/17 1500 05/10/17 1924  BP: 137/76   Pulse: 88 (!) 115  Resp: (!) 22 16  Temp:    SpO2: 100% 100%    Last Pain:  Vitals:   05/10/17 1200  TempSrc: Oral         Complications: No apparent anesthesia complications

## 2017-05-10 NOTE — Brief Op Note (Signed)
PREOP DX:  Occipitoatlantal dislocation, [place]-axial dislocation  POSTOP DX:  Same  PROCEDURE:  1. Occiput - C3 stabilization/fusion  SURGEON: Shlonda Dolloff  ASSISTANT: Jenkins  ANESTHESIA: GETA  EBL: 300cc  SPECIMENS: None  DRAINS: None  COMPLICATIONS: None immediate  CONDITION: Stable to ICU  FINDINGS:  Significant C1 and C2 instability noted. Good placement of hardware construct upon final fluoro.
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
## 2017-05-10 NOTE — Anesthesia Preprocedure Evaluation (Signed)
Anesthesia Evaluation  Patient identified by MRN, date of birth, ID band Patient unresponsive    Reviewed: Allergy & Precautions, H&P , NPO status , Patient's Chart, lab work & pertinent test results  Airway Mallampati: Intubated       Dental no notable dental hx.    Pulmonary  Hypoxic respiratory failure   Pulmonary exam normal breath sounds clear to auscultation       Cardiovascular negative cardio ROS Normal cardiovascular exam Rhythm:Regular Rate:Normal     Neuro/Psych TBI quadraparesis negative psych ROS   GI/Hepatic negative GI ROS, Neg liver ROS,   Endo/Other  negative endocrine ROS  Renal/GU negative Renal ROS     Musculoskeletal negative musculoskeletal ROS (+)   Abdominal   Peds  Hematology  (+) anemia ,   Anesthesia Other Findings   Reproductive/Obstetrics                             Lab Results  Component Value Date   WBC 9.8 05/10/2017   HGB 9.4 (L) 05/10/2017   HCT 29.4 (L) 05/10/2017   MCV 88.0 05/10/2017   PLT 245 05/10/2017   Lab Results  Component Value Date   CREATININE 0.60 (L) 05/10/2017   BUN 18 05/10/2017   NA 144 05/10/2017   K 3.9 05/10/2017   CL 111 05/10/2017   CO2 28 05/10/2017    Anesthesia Physical  Anesthesia Plan  ASA: IV  Anesthesia Plan: General   Post-op Pain Management:    Induction: Inhalational  PONV Risk Score and Plan: 3 and Ondansetron, Dexamethasone and Midazolam  Airway Management Planned: Tracheostomy  Additional Equipment:   Intra-op Plan:   Post-operative Plan: Post-operative intubation/ventilation  Informed Consent: I have reviewed the patients History and Physical, chart, labs and discussed the procedure including the risks, benefits and alternatives for the proposed anesthesia with the patient or authorized representative who has indicated his/her understanding and acceptance.   Dental advisory given  Plan  Discussed with: CRNA, Anesthesiologist and Surgeon  Anesthesia Plan Comments:         Anesthesia Quick Evaluation

## 2017-05-10 NOTE — Progress Notes (Signed)
Pt taken off vent to transport to OR with CRNA using an ambu bag.
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
## 2017-05-11 LAB — POCT I-STAT 3, ART BLOOD GAS (G3+)
Acid-Base Excess: 4 mmol/L — ABNORMAL HIGH (ref 0.0–2.0)
BICARBONATE: 27.8 mmol/L (ref 20.0–28.0)
O2 Saturation: 97 %
PCO2 ART: 40.6 mmHg (ref 32.0–48.0)
PO2 ART: 102 mmHg (ref 83.0–108.0)
Patient temperature: 103.1
TCO2: 29 mmol/L (ref 22–32)
pH, Arterial: 7.453 — ABNORMAL HIGH (ref 7.350–7.450)

## 2017-05-11 LAB — BASIC METABOLIC PANEL
ANION GAP: 6 (ref 5–15)
BUN: 15 mg/dL (ref 6–20)
CHLORIDE: 106 mmol/L (ref 101–111)
CO2: 27 mmol/L (ref 22–32)
CREATININE: 0.62 mg/dL (ref 0.61–1.24)
Calcium: 8.4 mg/dL — ABNORMAL LOW (ref 8.9–10.3)
GLUCOSE: 137 mg/dL — AB (ref 65–99)
POTASSIUM: 4.1 mmol/L (ref 3.5–5.1)
SODIUM: 139 mmol/L (ref 135–145)

## 2017-05-11 LAB — GLUCOSE, CAPILLARY
GLUCOSE-CAPILLARY: 132 mg/dL — AB (ref 65–99)
GLUCOSE-CAPILLARY: 106 mg/dL — AB (ref 65–99)
GLUCOSE-CAPILLARY: 105 mg/dL — AB (ref 65–99)
Glucose-Capillary: 117 mg/dL — ABNORMAL HIGH (ref 65–99)
Glucose-Capillary: 134 mg/dL — ABNORMAL HIGH (ref 65–99)
Glucose-Capillary: 135 mg/dL — ABNORMAL HIGH (ref 65–99)

## 2017-05-11 LAB — CBC
HCT: 30.2 % — ABNORMAL LOW (ref 39.0–52.0)
HEMOGLOBIN: 9.6 g/dL — AB (ref 13.0–17.0)
MCH: 27.7 pg (ref 26.0–34.0)
MCHC: 31.8 g/dL (ref 30.0–36.0)
MCV: 87 fL (ref 78.0–100.0)
PLATELETS: 306 10*3/uL (ref 150–400)
RBC: 3.47 MIL/uL — AB (ref 4.22–5.81)
RDW: 15.9 % — ABNORMAL HIGH (ref 11.5–15.5)
WBC: 11.2 10*3/uL — AB (ref 4.0–10.5)

## 2017-05-11 MED ORDER — IBUPROFEN 100 MG/5ML PO SUSP: 800.0000 mg | Freq: Three times a day (TID) | ORAL | Status: DC | PRN

## 2017-05-11 MED ORDER — IBUPROFEN 100 MG/5ML PO SUSP: 800.0000 mg | Freq: Three times a day (TID) | ORAL | Status: DC

## 2017-05-11 MED ORDER — IBUPROFEN 100 MG/5ML PO SUSP
400.0000 mg | Freq: Three times a day (TID) | ORAL | Status: DC | PRN
  Administered 2017-05-11 – 2017-05-14 (×7): 400 mg via ORAL
  Filled 2017-05-11 (×8): qty 20

## 2017-05-11 NOTE — Progress Notes (Signed)
At request of pt.'s wife who is a [employer] employee PRN Gertha Calkin) prayed with patient, children and grandchildren. A consult has been put in such that the pt. can receive prayer regularly.
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
## 2017-05-11 NOTE — Anesthesia Postprocedure Evaluation (Signed)
Anesthesia Post Note  Patient: Vincent WESTERHOLD Sr.  Procedure(s) Performed: Procedure(s) (LRB): POSTERIOR CERVICAL FUSION OCCIPUT- CERVICAL FOUR (N/A)     Patient location during evaluation: PACU Anesthesia Type: General Level of consciousness: awake Pain management: pain level controlled Vital Signs Assessment: post-procedure vital signs reviewed and stable Respiratory status: spontaneous breathing Cardiovascular status: stable Postop Assessment: no signs of nausea or vomiting Anesthetic complications: no    Last Vitals:  Vitals:   05/10/17 2300 05/11/17 0000  BP: (!) 143/81 (!) 144/83  Pulse: 98 96  Resp: 18 (!) 23  Temp:  36.9 C  SpO2: 96% 98%    Last Pain:  Vitals:   05/11/17 0000  TempSrc: Axillary                 Mikalah Skyles

## 2017-05-11 NOTE — Progress Notes (Signed)
Patient ID: [name]., male   DOB: May 11, 1966, 51 y.o.   MRN: [id number] Subjective:  The patient is alert. He is in no apparent distress.  Objective: Vital signs in last 24 hours: Temp:  [98.4 F (36.9 C)-103.2 F (39.6 C)] 103.2 F (39.6 C) (09/01 0800) Pulse Rate:  [84-115] 109 (09/01 0955) Resp:  [16-30] 28 (09/01 0955) BP: (136-173)/(74-89) 173/83 (09/01 0955) SpO2:  [88 %-100 %] 88 % (09/01 0955) FiO2 (%):  [40 %-50 %] 50 % (09/01 1005) Weight:  [104.4 kg (230 lb 2.6 oz)] 104.4 kg (230 lb 2.6 oz) (09/01 0413)  Intake/Output from previous day: 08/31 0701 - 09/01 0700 In: 5229.4 [I.V.:4486.4; NG/GT:193; IV Piggyback:550] Out: 2355 [Urine:3250; Blood:300] Intake/Output this shift: Total I/O In: 255 [I.V.:215; NG/GT:40] Out: 675 [Urine:675]  Physical exam the patient is alert. He will follow commands by blinking. He is quadriplegic.  Lab Results:  Recent Labs  05/10/17 0401 05/11/17 0411  WBC 9.8 11.2*  HGB 9.4* 9.6*  HCT 29.4* 30.2*  PLT 245 306   BMET  Recent Labs  05/10/17 0401 05/11/17 0411  NA 144 139  K 3.9 4.1  CL 111 106  CO2 28 27  GLUCOSE 116* 137*  BUN 18 15  CREATININE 0.60* 0.62  CALCIUM 8.4* 8.4*    Studies/Results: Dg Cervical Spine 1 View  Result Date: 05/10/2017 CLINICAL DATA:  Occipital to C4 fusion EXAM: CERVICAL SPINE 1 VIEW COMPARISON:  CT 05/03/2017, MRI 05/06/2017 FINDINGS: Single low resolution spot intraoperative view of the upper cervical spine in the lateral projection submitted postoperatively. Total fluoroscopy time was 25 seconds. Surgical retracting devices posterior to the upper cervical spine. Posterior rods and fixating screws from the skullbase to C3 IMPRESSION: Intraoperative fluoroscopic assistance provided during cervical spine surgery Electronically Signed   By: Donavan Foil M.Elliott.   On: 05/10/2017 22:48   Dg Chest Port 1 View  Result Date: 05/10/2017 CLINICAL DATA:  Respiratory failure. EXAM: PORTABLE  CHEST 1 VIEW COMPARISON:  Chest x-ray dated May 08, 2017. FINDINGS: Tracheostomy to in good position with the tip approximately 3.7 cm above the level of the carina. Interval insertion of a right-sided PICC catheter with the tip projecting over the cavoatrial junction. The cardiomediastinal silhouette remains mildly enlarged. Mild pulmonary vascular congestion again noted. Unchanged small right pleural lesion with fluid in the minor fissure. Low lung volumes with improved aeration of the right lung base. No pneumothorax. No acute osseous abnormality. IMPRESSION: 1. Tracheostomy tube remains in good position. 2. Unchanged small right pleural effusion. Improved aeration of the right lung base. Electronically Signed   By: Titus Dubin M.Elliott.   On: 05/10/2017 09:28   Dg C-arm Gt 120 Min  Result Date: 05/10/2017 CLINICAL DATA:  Occipital to C4 fusion EXAM: CERVICAL SPINE 1 VIEW COMPARISON:  CT 05/03/2017, MRI 05/06/2017 FINDINGS: Single low resolution spot intraoperative view of the upper cervical spine in the lateral projection submitted postoperatively. Total fluoroscopy time was 25 seconds. Surgical retracting devices posterior to the upper cervical spine. Posterior rods and fixating screws from the skullbase to C3 IMPRESSION: Intraoperative fluoroscopic assistance provided during cervical spine surgery Electronically Signed   By: Donavan Foil M.Elliott.   On: 05/10/2017 22:48    Assessment/Plan: Postop day #1 status post occipitocervical fusion. The patient is stable clinically.  LOS: 8 days     [name] 05/11/2017, 10:09 AM

## 2017-05-11 NOTE — Progress Notes (Signed)
On call MD notified of patients tmax 105, despite efforts of cooling blanket and medication. New orders given and carried out. Will continue to monitor.Lianne Bushy RN BSN.

## 2017-05-12 LAB — GLUCOSE, CAPILLARY
GLUCOSE-CAPILLARY: 129 mg/dL — AB (ref 65–99)
GLUCOSE-CAPILLARY: 115 mg/dL — AB (ref 65–99)
Glucose-Capillary: 145 mg/dL — ABNORMAL HIGH (ref 65–99)

## 2017-05-12 MED ORDER — OXYCODONE HCL 5 MG PO TABS: 5.0000 mg | ORAL_TABLET | Freq: Four times a day (QID) | ORAL | Status: DC

## 2017-05-12 NOTE — Progress Notes (Signed)
Vital signs stable Dressings are intact Patient remains intubated No mobility in upper or lower extremities noted
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
## 2017-05-13 LAB — GLUCOSE, CAPILLARY
GLUCOSE-CAPILLARY: 109 mg/dL — AB (ref 65–99)
GLUCOSE-CAPILLARY: 113 mg/dL — AB (ref 65–99)
GLUCOSE-CAPILLARY: 122 mg/dL — AB (ref 65–99)
GLUCOSE-CAPILLARY: 132 mg/dL — AB (ref 65–99)
GLUCOSE-CAPILLARY: 95 mg/dL (ref 65–99)

## 2017-05-13 NOTE — Progress Notes (Signed)
Vital signs are stable Patient has been off of ventilator most of day breathing on his own with Passy-Muir valve Some motor function is being noted in the hands by the nurses and I have seen this with trace wiggle of the fingertips Patient also has trace movement in his toes that is appearing Findings are very encouraging
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
## 2017-05-14 ENCOUNTER — Inpatient Hospital Stay (HOSPITAL_COMMUNITY): Payer: Medicaid Other

## 2017-05-14 ENCOUNTER — Encounter (HOSPITAL_COMMUNITY): Payer: Self-pay | Admitting: Physical Medicine and Rehabilitation

## 2017-05-14 DIAGNOSIS — R6510 Systemic inflammatory response syndrome (SIRS) of non-infectious origin without acute organ dysfunction: Secondary | ICD-10-CM

## 2017-05-14 DIAGNOSIS — R509 Fever, unspecified: Secondary | ICD-10-CM

## 2017-05-14 DIAGNOSIS — F1910 Other psychoactive substance abuse, uncomplicated: Secondary | ICD-10-CM

## 2017-05-14 DIAGNOSIS — S069X9D Unspecified intracranial injury with loss of consciousness of unspecified duration, subsequent encounter: Secondary | ICD-10-CM

## 2017-05-14 DIAGNOSIS — J9690 Respiratory failure, unspecified, unspecified whether with hypoxia or hypercapnia: Secondary | ICD-10-CM

## 2017-05-14 DIAGNOSIS — T148XXA Other injury of unspecified body region, initial encounter: Secondary | ICD-10-CM

## 2017-05-14 DIAGNOSIS — T1490XA Injury, unspecified, initial encounter: Secondary | ICD-10-CM

## 2017-05-14 DIAGNOSIS — D72829 Elevated white blood cell count, unspecified: Secondary | ICD-10-CM

## 2017-05-14 DIAGNOSIS — Z931 Gastrostomy status: Secondary | ICD-10-CM

## 2017-05-14 DIAGNOSIS — J9601 Acute respiratory failure with hypoxia: Secondary | ICD-10-CM

## 2017-05-14 DIAGNOSIS — G8918 Other acute postprocedural pain: Secondary | ICD-10-CM

## 2017-05-14 DIAGNOSIS — D62 Acute posthemorrhagic anemia: Secondary | ICD-10-CM

## 2017-05-14 DIAGNOSIS — R000 Tachycardia, unspecified: Secondary | ICD-10-CM

## 2017-05-14 LAB — BASIC METABOLIC PANEL
ANION GAP: 5 (ref 5–15)
BUN: 16 mg/dL (ref 6–20)
CALCIUM: 8.1 mg/dL — AB (ref 8.9–10.3)
CHLORIDE: 102 mmol/L (ref 101–111)
CO2: 29 mmol/L (ref 22–32)
Creatinine, Ser: 0.58 mg/dL — ABNORMAL LOW (ref 0.61–1.24)
GFR calc non Af Amer: >60 mL/min (ref >60)
GLUCOSE: 107 mg/dL — AB (ref 65–99)
POTASSIUM: 4.1 mmol/L (ref 3.5–5.1)
Sodium: 136 mmol/L (ref 135–145)

## 2017-05-14 LAB — BPAM RBC
Blood Product Expiration Date: 201810022359
Blood Product Expiration Date: 201810022359
UNIT TYPE AND RH: 5100
Unit Type and Rh: 5100

## 2017-05-14 LAB — TYPE AND SCREEN
ABO/RH(D): O POS
Antibody Screen: NEGATIVE
UNIT DIVISION: 0
Unit division: 0

## 2017-05-14 LAB — GLUCOSE, CAPILLARY
GLUCOSE-CAPILLARY: 125 mg/dL — AB (ref 65–99)
GLUCOSE-CAPILLARY: 150 mg/dL — AB (ref 65–99)
GLUCOSE-CAPILLARY: 106 mg/dL — AB (ref 65–99)
Glucose-Capillary: 114 mg/dL — ABNORMAL HIGH (ref 65–99)
Glucose-Capillary: 135 mg/dL — ABNORMAL HIGH (ref 65–99)
Glucose-Capillary: 128 mg/dL — ABNORMAL HIGH (ref 65–99)

## 2017-05-14 LAB — CBC
HEMATOCRIT: 28.8 % — AB (ref 39.0–52.0)
Hemoglobin: 9 g/dL — ABNORMAL LOW (ref 13.0–17.0)
MCH: 27.4 pg (ref 26.0–34.0)
MCHC: 31.3 g/dL (ref 30.0–36.0)
MCV: 87.8 fL (ref 78.0–100.0)
Platelets: 353 10*3/uL (ref 150–400)
RBC: 3.28 MIL/uL — ABNORMAL LOW (ref 4.22–5.81)
RDW: 15.8 % — AB (ref 11.5–15.5)
WBC: 12.2 10*3/uL — AB (ref 4.0–10.5)

## 2017-05-14 MED ORDER — PRO-STAT SUGAR FREE PO LIQD
30.0000 mL | Freq: Every day | ORAL | Status: DC
  Administered 2017-05-15 – 2017-05-21 (×6): 30 mL
  Filled 2017-05-14 (×6): qty 30

## 2017-05-14 MED ORDER — PIVOT 1.5 CAL PO LIQD
1000.0000 mL | ORAL | Status: DC
  Administered 2017-05-14 – 2017-05-21 (×7): 1000 mL
  Filled 2017-05-14 (×9): qty 1000

## 2017-05-14 MED ORDER — IOPAMIDOL (ISOVUE-300) INJECTION 61%
INTRAVENOUS | Status: AC
  Administered 2017-05-14: 75 mL
  Filled 2017-05-14: qty 75

## 2017-05-14 NOTE — Progress Notes (Signed)
Orthopedic Tech Progress Note Patient Details:  Vincent HOBIN Sr. 1966-08-21 [id number]  Ortho Devices Type of Ortho Device: CAM walker Ortho Device/Splint Location: 2''and 3'' web wrap Ortho Device/Splint Interventions: Veleta Miners 05/14/2017, 11:13 AM

## 2017-05-14 NOTE — Progress Notes (Signed)
Rehab Admissions Coordinator Note:  Patient was screened by Retta Diones for appropriateness for an Inpatient Acute Rehab Consult.  At this time, we are recommending Inpatient Rehab consult.  Retta Diones 05/14/2017, 10:47 AM  I can be reached at [phone].

## 2017-05-14 NOTE — Progress Notes (Signed)
OT Cancellation Note  Patient Details Name: Vincent PHARO Sr. MRN: [id number] DOB: 08-Feb-1966   Cancelled Treatment:    Reason Eval/Treat Not Completed: Patient not medically ready (on bedrest). Please update activity orders when appropriate for therapy. thanks  [place], OT/L  [phone] 05/14/2017 05/14/2017, 9:05 AM
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
## 2017-05-14 NOTE — Progress Notes (Signed)
   Subjective:    Patient ID: [name]., male    DOB: September 11, 1965, 51 y.o.   MRN: [id number]  HPI  Off ventilator.  [place] working well.  Review of Systems     Objective:   Physical Exam Alert, following commands. MMF wires in good position, firm fixation. Lurline Idol site looks good with cuffed #8 Shiley in place, sutures were removed by nursing today. Hard cervical collar. Left eye with some proptosis and limited movement in all directions, conjunctival edema and redness.  PERRL.     Assessment & Plan:  Left angle mandible fracture, tongue laceration, and respiratory failure s/p tongue laceration closure, MMF, and tracheostomy now with left-sided eye proptosis and ophthalmoplegia.  The trach site and MMF fixation look good.  Plan to keep mandible fixated for four weeks.  May down-size to cuffless trach tube when definitely does not need ventilator.  Left eye findings are new by my exam.  Not sure how long present.  Review of maxillofacial CT at admission with no left orbital fracture.  Reason for findings not clear.  Will order orbital CT.

## 2017-05-14 NOTE — Progress Notes (Signed)
Orthopedic Tech Progress Note Patient Details:  Vincent TETRICK Sr. [birth year] [id number]  Ortho Devices Type of Ortho Device: Abdominal binder Ortho Device/Splint Location: 2''and 3'' web wrap Ortho Device/Splint Interventions: Veleta Miners 05/14/2017, 1:25 PM

## 2017-05-15 LAB — BASIC METABOLIC PANEL
Anion gap: 7 (ref 5–15)
BUN: 11 mg/dL (ref 6–20)
CHLORIDE: 102 mmol/L (ref 101–111)
CO2: 27 mmol/L (ref 22–32)
CREATININE: 0.55 mg/dL — AB (ref 0.61–1.24)
Calcium: 8.4 mg/dL — ABNORMAL LOW (ref 8.9–10.3)
GFR calc Af Amer: >60 mL/min (ref >60)
GFR calc non Af Amer: >60 mL/min (ref >60)
Glucose, Bld: 143 mg/dL — ABNORMAL HIGH (ref 65–99)
POTASSIUM: 4 mmol/L (ref 3.5–5.1)
Sodium: 136 mmol/L (ref 135–145)

## 2017-05-15 LAB — GLUCOSE, CAPILLARY
GLUCOSE-CAPILLARY: 144 mg/dL — AB (ref 65–99)
GLUCOSE-CAPILLARY: 140 mg/dL — AB (ref 65–99)
GLUCOSE-CAPILLARY: 132 mg/dL — AB (ref 65–99)
Glucose-Capillary: 131 mg/dL — ABNORMAL HIGH (ref 65–99)
Glucose-Capillary: 129 mg/dL — ABNORMAL HIGH (ref 65–99)
Glucose-Capillary: 133 mg/dL — ABNORMAL HIGH (ref 65–99)

## 2017-05-15 LAB — CBC
HEMATOCRIT: 29.1 % — AB (ref 39.0–52.0)
HEMOGLOBIN: 9.1 g/dL — AB (ref 13.0–17.0)
MCH: 27 pg (ref 26.0–34.0)
MCHC: 31.3 g/dL (ref 30.0–36.0)
MCV: 86.4 fL (ref 78.0–100.0)
Platelets: 433 10*3/uL — ABNORMAL HIGH (ref 150–400)
RBC: 3.37 MIL/uL — ABNORMAL LOW (ref 4.22–5.81)
RDW: 15.6 % — ABNORMAL HIGH (ref 11.5–15.5)
WBC: 11.8 10*3/uL — ABNORMAL HIGH (ref 4.0–10.5)

## 2017-05-15 MED ORDER — CEFAZOLIN SODIUM-DEXTROSE 2-4 GM/100ML-% IV SOLN
2.0000 g | INTRAVENOUS | Status: AC
  Administered 2017-05-16 (×2): 2 g via INTRAVENOUS
  Filled 2017-05-15: qty 100

## 2017-05-15 MED ORDER — DEXTROSE 5 % IV SOLN
500.0000 mg | Freq: Three times a day (TID) | INTRAVENOUS | Status: DC
  Administered 2017-05-15 – 2017-05-21 (×19): 500 mg via INTRAVENOUS
  Filled 2017-05-15 (×2): qty 5
  Filled 2017-05-15: qty 550
  Filled 2017-05-15 (×4): qty 5
  Filled 2017-05-15: qty 550
  Filled 2017-05-15 (×8): qty 5
  Filled 2017-05-15: qty 550
  Filled 2017-05-15: qty 5
  Filled 2017-05-15: qty 550
  Filled 2017-05-15 (×2): qty 5
  Filled 2017-05-15: qty 550
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
## 2017-05-15 NOTE — Anesthesia Preprocedure Evaluation (Signed)
Anesthesia Evaluation  Patient identified by MRN, date of birth, ID band Patient unresponsive    Reviewed: Allergy & Precautions, H&P , NPO status , Patient's Chart, lab work & pertinent test results  Airway Mallampati: Intubated       Dental no notable dental hx.    Pulmonary  Hypoxic respiratory failure   Pulmonary exam normal breath sounds clear to auscultation       Cardiovascular negative cardio ROS Normal cardiovascular exam Rhythm:Regular Rate:Normal     Neuro/Psych TBI quadraparesis negative psych ROS   GI/Hepatic negative GI ROS, Neg liver ROS,   Endo/Other  negative endocrine ROS  Renal/GU negative Renal ROS     Musculoskeletal negative musculoskeletal ROS (+)   Abdominal   Peds  Hematology  (+) anemia ,   Anesthesia Other Findings   Reproductive/Obstetrics                             Lab Results  Component Value Date   WBC 11.8 (H) 05/15/2017   HGB 9.1 (L) 05/15/2017   HCT 29.1 (L) 05/15/2017   MCV 86.4 05/15/2017   PLT 433 (H) 05/15/2017   Lab Results  Component Value Date   CREATININE 0.55 (L) 05/15/2017   BUN 11 05/15/2017   NA 136 05/15/2017   K 4.0 05/15/2017   CL 102 05/15/2017   CO2 27 05/15/2017    Anesthesia Physical  Anesthesia Plan  ASA: IV  Anesthesia Plan: General   Post-op Pain Management:    Induction: Inhalational  PONV Risk Score and Plan: 3 and Ondansetron, Dexamethasone and Midazolam  Airway Management Planned: Tracheostomy  Additional Equipment:   Intra-op Plan:   Post-operative Plan: Post-operative intubation/ventilation  Informed Consent: I have reviewed the patients History and Physical, chart, labs and discussed the procedure including the risks, benefits and alternatives for the proposed anesthesia with the patient or authorized representative who has indicated his/her understanding and acceptance.   Dental advisory  given  Plan Discussed with: CRNA, Anesthesiologist and Surgeon  Anesthesia Plan Comments:         Anesthesia Quick Evaluation

## 2017-05-15 NOTE — Progress Notes (Signed)
Rehab admissions - I am following for potential acute inpatient rehab admission once patient is medically ready.  I will contact family to discuss rehab venue.  Not yet ready for inpatient rehab admission.  Call me for questions.  [phone]
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
## 2017-05-16 ENCOUNTER — Inpatient Hospital Stay (HOSPITAL_COMMUNITY): Payer: Medicaid Other | Admitting: Anesthesiology

## 2017-05-16 ENCOUNTER — Encounter (HOSPITAL_COMMUNITY): Payer: Self-pay | Admitting: Anesthesiology

## 2017-05-16 ENCOUNTER — Encounter (HOSPITAL_COMMUNITY): Admission: EM | Disposition: A | Discharge status: Rehabilitation Facility | Payer: Self-pay | Source: Home / Self Care

## 2017-05-16 ENCOUNTER — Inpatient Hospital Stay (HOSPITAL_COMMUNITY): Payer: Medicaid Other

## 2017-05-16 HISTORY — PX: EXTERNAL FIXATION REMOVAL: SHX5040

## 2017-05-16 HISTORY — PX: ORIF TIBIA FRACTURE: SHX5416

## 2017-05-16 LAB — COMPREHENSIVE METABOLIC PANEL
ALK PHOS: 189 U/L — AB (ref 38–126)
ALT: 168 U/L — AB (ref 17–63)
ANION GAP: 7 (ref 5–15)
AST: 95 U/L — AB (ref 15–41)
Albumin: 2.2 g/dL — ABNORMAL LOW (ref 3.5–5.0)
BILIRUBIN TOTAL: 0.8 mg/dL (ref 0.3–1.2)
BUN: 11 mg/dL (ref 6–20)
CALCIUM: 8.7 mg/dL — AB (ref 8.9–10.3)
CO2: 26 mmol/L (ref 22–32)
Chloride: 101 mmol/L (ref 101–111)
Creatinine, Ser: 0.49 mg/dL — ABNORMAL LOW (ref 0.61–1.24)
GFR calc Af Amer: >60 mL/min (ref >60)
GLUCOSE: 116 mg/dL — AB (ref 65–99)
Potassium: 4.3 mmol/L (ref 3.5–5.1)
SODIUM: 134 mmol/L — AB (ref 135–145)
TOTAL PROTEIN: 6.4 g/dL — AB (ref 6.5–8.1)

## 2017-05-16 LAB — CBC
HCT: 30.1 % — ABNORMAL LOW (ref 39.0–52.0)
Hemoglobin: 9.6 g/dL — ABNORMAL LOW (ref 13.0–17.0)
MCH: 27.5 pg (ref 26.0–34.0)
MCHC: 31.9 g/dL (ref 30.0–36.0)
MCV: 86.2 fL (ref 78.0–100.0)
PLATELETS: 491 10*3/uL — AB (ref 150–400)
RBC: 3.49 MIL/uL — AB (ref 4.22–5.81)
RDW: 15.7 % — AB (ref 11.5–15.5)
WBC: 13.6 10*3/uL — ABNORMAL HIGH (ref 4.0–10.5)

## 2017-05-16 LAB — GLUCOSE, CAPILLARY
GLUCOSE-CAPILLARY: 133 mg/dL — AB (ref 65–99)
Glucose-Capillary: 122 mg/dL — ABNORMAL HIGH (ref 65–99)
Glucose-Capillary: 116 mg/dL — ABNORMAL HIGH (ref 65–99)
Glucose-Capillary: 110 mg/dL — ABNORMAL HIGH (ref 65–99)
Glucose-Capillary: 123 mg/dL — ABNORMAL HIGH (ref 65–99)

## 2017-05-16 LAB — PROTIME-INR
INR: 1.05
Prothrombin Time: 13.7 seconds (ref 11.4–15.2)

## 2017-05-16 LAB — APTT: aPTT: 32 seconds (ref 24–36)

## 2017-05-16 SURGERY — OPEN REDUCTION INTERNAL FIXATION (ORIF) TIBIA FRACTURE
Anesthesia: General | Laterality: Left

## 2017-05-16 MED ORDER — PHENYLEPHRINE 40 MCG/ML (10ML) SYRINGE FOR IV PUSH (FOR BLOOD PRESSURE SUPPORT)
PREFILLED_SYRINGE | INTRAVENOUS | Status: AC
  Filled 2017-05-16: qty 10

## 2017-05-16 MED ORDER — PHENYLEPHRINE HCL 10 MG/ML IJ SOLN
INTRAVENOUS | Status: DC | PRN
  Administered 2017-05-16: 25 ug/min via INTRAVENOUS

## 2017-05-16 MED ORDER — ONDANSETRON HCL 4 MG/2ML IJ SOLN
INTRAMUSCULAR | Status: DC | PRN
  Administered 2017-05-16: 4 mg via INTRAVENOUS

## 2017-05-16 MED ORDER — LACTATED RINGERS IV SOLN
INTRAVENOUS | Status: DC | PRN
  Administered 2017-05-16 (×3): via INTRAVENOUS

## 2017-05-16 MED ORDER — SUGAMMADEX SODIUM 500 MG/5ML IV SOLN
INTRAVENOUS | Status: DC | PRN
  Administered 2017-05-16: 200 mg via INTRAVENOUS

## 2017-05-16 MED ORDER — ENOXAPARIN SODIUM 40 MG/0.4ML ~~LOC~~ SOLN: 40.0000 mg | SUBCUTANEOUS | Status: DC

## 2017-05-16 MED ORDER — PHENYLEPHRINE HCL 10 MG/ML IJ SOLN
INTRAMUSCULAR | Status: DC | PRN
  Administered 2017-05-16: 80 ug via INTRAVENOUS

## 2017-05-16 MED ORDER — VANCOMYCIN HCL 1000 MG IV SOLR
INTRAVENOUS | Status: DC | PRN
  Administered 2017-05-16: 1000 mg via TOPICAL

## 2017-05-16 MED ORDER — LACTATED RINGERS IV SOLN
INTRAVENOUS | Status: DC
  Administered 2017-05-16 (×2): via INTRAVENOUS

## 2017-05-16 MED ORDER — ONDANSETRON HCL 4 MG/2ML IJ SOLN
INTRAMUSCULAR | Status: AC
Start: 2017-05-16 — End: 2017-05-16
  Filled 2017-05-16: qty 2

## 2017-05-16 MED ORDER — MEPERIDINE HCL 25 MG/ML IJ SOLN: 6.2500 mg | INTRAMUSCULAR | Status: DC | PRN

## 2017-05-16 MED ORDER — LIDOCAINE HCL (CARDIAC) 20 MG/ML IV SOLN
INTRAVENOUS | Status: DC | PRN
  Administered 2017-05-16: 40 mg via INTRAVENOUS
  Administered 2017-05-16: 60 mg via INTRAVENOUS

## 2017-05-16 MED ORDER — CEFAZOLIN SODIUM-DEXTROSE 2-4 GM/100ML-% IV SOLN
2.0000 g | Freq: Three times a day (TID) | INTRAVENOUS | Status: AC
  Administered 2017-05-16 – 2017-05-17 (×3): 2 g via INTRAVENOUS
  Filled 2017-05-16 (×4): qty 100

## 2017-05-16 MED ORDER — FENTANYL CITRATE (PF) 250 MCG/5ML IJ SOLN
INTRAMUSCULAR | Status: AC
  Filled 2017-05-16: qty 5

## 2017-05-16 MED ORDER — DEXMEDETOMIDINE HCL 200 MCG/2ML IV SOLN
INTRAVENOUS | Status: DC | PRN
  Administered 2017-05-16 (×3): 4 ug via INTRAVENOUS

## 2017-05-16 MED ORDER — MIDAZOLAM HCL 5 MG/5ML IJ SOLN
INTRAMUSCULAR | Status: DC | PRN
  Administered 2017-05-16 (×2): 0.5 mg via INTRAVENOUS
  Administered 2017-05-16: 1 mg via INTRAVENOUS

## 2017-05-16 MED ORDER — FENTANYL CITRATE (PF) 100 MCG/2ML IJ SOLN: 25.0000 ug | INTRAMUSCULAR | Status: DC | PRN

## 2017-05-16 MED ORDER — LIDOCAINE 2% (20 MG/ML) 5 ML SYRINGE
INTRAMUSCULAR | Status: AC
  Filled 2017-05-16: qty 5

## 2017-05-16 MED ORDER — ENOXAPARIN SODIUM 40 MG/0.4ML ~~LOC~~ SOLN
40.0000 mg | SUBCUTANEOUS | Status: DC
  Administered 2017-05-17 – 2017-05-21 (×5): 40 mg via SUBCUTANEOUS
  Filled 2017-05-16 (×5): qty 0.4

## 2017-05-16 MED ORDER — ONDANSETRON HCL 4 MG/2ML IJ SOLN: 4.0000 mg | Freq: Once | INTRAMUSCULAR | Status: DC | PRN

## 2017-05-16 MED ORDER — VANCOMYCIN HCL 1000 MG IV SOLR
INTRAVENOUS | Status: AC
  Filled 2017-05-16: qty 1000

## 2017-05-16 MED ORDER — ROCURONIUM BROMIDE 100 MG/10ML IV SOLN
INTRAVENOUS | Status: DC | PRN
  Administered 2017-05-16 (×2): 30 mg via INTRAVENOUS
  Administered 2017-05-16: 20 mg via INTRAVENOUS

## 2017-05-16 MED ORDER — SUCCINYLCHOLINE CHLORIDE 200 MG/10ML IV SOSY
PREFILLED_SYRINGE | INTRAVENOUS | Status: AC
  Filled 2017-05-16: qty 10

## 2017-05-16 MED ORDER — 0.9 % SODIUM CHLORIDE (POUR BTL) OPTIME
TOPICAL | Status: DC | PRN
  Administered 2017-05-16: 1000 mL

## 2017-05-16 MED ORDER — PROPOFOL 10 MG/ML IV BOLUS
INTRAVENOUS | Status: AC
  Filled 2017-05-16: qty 40

## 2017-05-16 MED ORDER — ROCURONIUM BROMIDE 10 MG/ML (PF) SYRINGE
PREFILLED_SYRINGE | INTRAVENOUS | Status: AC
  Filled 2017-05-16: qty 5

## 2017-05-16 MED ORDER — AMPICILLIN-SULBACTAM SODIUM 1.5 (1-0.5) G IJ SOLR
1.5000 g | Freq: Four times a day (QID) | INTRAMUSCULAR | Status: DC
  Administered 2017-05-16 – 2017-05-21 (×19): 1.5 g via INTRAVENOUS
  Filled 2017-05-16 (×24): qty 1.5

## 2017-05-16 MED ORDER — MIDAZOLAM HCL 2 MG/2ML IJ SOLN
INTRAMUSCULAR | Status: AC
  Filled 2017-05-16: qty 2

## 2017-05-16 MED ORDER — FENTANYL CITRATE (PF) 100 MCG/2ML IJ SOLN
INTRAMUSCULAR | Status: DC | PRN
  Administered 2017-05-16: 100 ug via INTRAVENOUS
  Administered 2017-05-16 (×8): 50 ug via INTRAVENOUS

## 2017-05-16 MED ORDER — ALBUMIN HUMAN 5 % IV SOLN
INTRAVENOUS | Status: DC | PRN
  Administered 2017-05-16: 09:00:00 via INTRAVENOUS

## 2017-05-16 SURGICAL SUPPLY — 84 items
BANDAGE ACE 4X5 VEL STRL LF (GAUZE/BANDAGES/DRESSINGS) ×3 IMPLANT
BANDAGE ACE 6X5 VEL STRL LF (GAUZE/BANDAGES/DRESSINGS) ×3 IMPLANT
BANDAGE ESMARK 6X9 LF (GAUZE/BANDAGES/DRESSINGS) ×1 IMPLANT
BIT DRILL LCP QC 2X140 (BIT) ×3 IMPLANT
BLADE CLIPPER SURG (BLADE) IMPLANT
BNDG COHESIVE 4X5 TAN STRL (GAUZE/BANDAGES/DRESSINGS) IMPLANT
BNDG ESMARK 6X9 LF (GAUZE/BANDAGES/DRESSINGS) ×3
BNDG GAUZE ELAST 4 BULKY (GAUZE/BANDAGES/DRESSINGS) ×3 IMPLANT
BRUSH SCRUB SURG 4.25 DISP (MISCELLANEOUS) ×6 IMPLANT
CANISTER SUCTION 1500CC (MISCELLANEOUS) ×3 IMPLANT
CHLORAPREP W/TINT 26ML (MISCELLANEOUS) ×3 IMPLANT
COVER MAYO STAND STRL (DRAPES) ×3 IMPLANT
COVER SURGICAL LIGHT HANDLE (MISCELLANEOUS) ×3 IMPLANT
DRAPE C-ARM 42X72 X-RAY (DRAPES) ×3 IMPLANT
DRAPE C-ARMOR (DRAPES) IMPLANT
DRAPE HALF SHEET 40X57 (DRAPES) ×6 IMPLANT
DRAPE INCISE IOBAN 66X45 STRL (DRAPES) ×3 IMPLANT
DRAPE ORTHO SPLIT 77X108 STRL (DRAPES) ×2
DRAPE SURG ORHT 6 SPLT 77X108 (DRAPES) ×1 IMPLANT
DRAPE U-SHAPE 47X51 STRL (DRAPES) ×3 IMPLANT
DRESSING PREVENA PLUS CUSTOM (GAUZE/BANDAGES/DRESSINGS) ×1 IMPLANT
DRSG ADAPTIC 3X8 NADH LF (GAUZE/BANDAGES/DRESSINGS) IMPLANT
DRSG MEPITEL 4X7.2 (GAUZE/BANDAGES/DRESSINGS) ×3 IMPLANT
DRSG PAD ABDOMINAL 8X10 ST (GAUZE/BANDAGES/DRESSINGS) ×6 IMPLANT
DRSG PREVENA PLUS CUSTOM (GAUZE/BANDAGES/DRESSINGS) ×3
DRSG VAC ATS SM SENSATRAC (GAUZE/BANDAGES/DRESSINGS) ×3 IMPLANT
ELECT REM PT RETURN 9FT ADLT (ELECTROSURGICAL) ×3
ELECTRODE REM PT RTRN 9FT ADLT (ELECTROSURGICAL) ×1 IMPLANT
GAUZE SPONGE 4X4 12PLY STRL (GAUZE/BANDAGES/DRESSINGS) IMPLANT
GAUZE SPONGE 4X4 12PLY STRL LF (GAUZE/BANDAGES/DRESSINGS) ×3 IMPLANT
GAUZE SPONGE 4X4 16PLY XRAY LF (GAUZE/BANDAGES/DRESSINGS) ×3 IMPLANT
GLOVE BIO SURGEON STRL SZ7.5 (GLOVE) ×12 IMPLANT
GLOVE BIOGEL PI IND STRL 7.5 (GLOVE) ×1 IMPLANT
GLOVE BIOGEL PI INDICATOR 7.5 (GLOVE) ×2
GLOVE PROGUARD SZ 7 1/2 (GLOVE) ×3 IMPLANT
GOWN STRL REUS W/ TWL LRG LVL3 (GOWN DISPOSABLE) ×2 IMPLANT
GOWN STRL REUS W/TWL LRG LVL3 (GOWN DISPOSABLE) ×4
KIT BASIN OR (CUSTOM PROCEDURE TRAY) ×3 IMPLANT
KIT PREVENA INCISION MGT20CM45 (CANNISTER) IMPLANT
KIT ROOM TURNOVER OR (KITS) ×3 IMPLANT
LOOP VESSEL MINI RED (MISCELLANEOUS) ×3 IMPLANT
MANIFOLD NEPTUNE II (INSTRUMENTS) IMPLANT
NS IRRIG 1000ML POUR BTL (IV SOLUTION) ×3 IMPLANT
PACK TOTAL JOINT (CUSTOM PROCEDURE TRAY) ×3 IMPLANT
PAD ABD 8X10 STRL (GAUZE/BANDAGES/DRESSINGS) ×3 IMPLANT
PAD ARMBOARD 7.5X6 YLW CONV (MISCELLANEOUS) ×6 IMPLANT
PAD CAST 4YDX4 CTTN HI CHSV (CAST SUPPLIES) IMPLANT
PADDING CAST COTTON 4X4 STRL (CAST SUPPLIES)
PADDING CAST COTTON 6X4 STRL (CAST SUPPLIES) IMPLANT
PLATE LOCK TIB 18H 2.7/3.5 (Plate) ×3 IMPLANT
SCREW 2.7X40MM (Screw) ×3 IMPLANT
SCREW LOCK VA 3.5X40MM (Screw) ×3 IMPLANT
SCREW LOCKING 2.7X44MM VA (Screw) ×6 IMPLANT
SCREW LOCKING VA 2.7X42 (Screw) ×3 IMPLANT
SCREW LOCKING VA 2.7X46 (Screw) ×6 IMPLANT
SCREW LOCKING VA 3.5X34MM (Screw) ×3 IMPLANT
SCREW THRD STARDRIVE ST 2.7X44 (Screw) ×6 IMPLANT
SPONGE LAP 18X18 X RAY DECT (DISPOSABLE) IMPLANT
STAPLER VISISTAT 35W (STAPLE) IMPLANT
STOCKINETTE IMPERVIOUS LG (DRAPES) IMPLANT
SUCTION FRAZIER HANDLE 10FR (MISCELLANEOUS) ×2
SUCTION TUBE FRAZIER 10FR DISP (MISCELLANEOUS) ×1 IMPLANT
SUT ETHILON 3 0 PS 1 (SUTURE) ×3 IMPLANT
SUT MNCRL AB 3-0 PS2 18 (SUTURE) IMPLANT
SUT MON AB 2-0 CT1 36 (SUTURE) IMPLANT
SUT PROLENE 0 CT (SUTURE) IMPLANT
SUT VIC AB 0 CT1 18XCR BRD 8 (SUTURE) ×1 IMPLANT
SUT VIC AB 0 CT1 27 (SUTURE) ×6
SUT VIC AB 0 CT1 27XBRD ANBCTR (SUTURE) ×3 IMPLANT
SUT VIC AB 0 CT1 8-18 (SUTURE) ×2
SUT VIC AB 1 CT1 27 (SUTURE)
SUT VIC AB 1 CT1 27XBRD ANBCTR (SUTURE) IMPLANT
SUT VIC AB 2-0 CT1 27 (SUTURE) ×2
SUT VIC AB 2-0 CT1 TAPERPNT 27 (SUTURE) ×1 IMPLANT
SUT VICRYL 2 0 18  TIES (SUTURE) ×2
SUT VICRYL 2 0 18 TIES (SUTURE) ×1 IMPLANT
TOWEL OR 17X24 6PK STRL BLUE (TOWEL DISPOSABLE) ×3 IMPLANT
TOWEL OR 17X26 10 PK STRL BLUE (TOWEL DISPOSABLE) ×6 IMPLANT
TRAY FOLEY W/METER SILVER 16FR (SET/KITS/TRAYS/PACK) IMPLANT
TUBE CONNECTING 12'X1/4 (SUCTIONS)
TUBE CONNECTING 12X1/4 (SUCTIONS) IMPLANT
UNDERPAD 30X30 (UNDERPADS AND DIAPERS) ×3 IMPLANT
WATER STERILE IRR 1000ML POUR (IV SOLUTION) IMPLANT
YANKAUER SUCT BULB TIP NO VENT (SUCTIONS) IMPLANT

## 2017-05-16 NOTE — Progress Notes (Signed)
PT Cancellation Note  Patient Details Name: Vincent HEHL Sr. MRN: [id number] DOB: May 14, 1966   Cancelled Treatment:    Reason Eval/Treat Not Completed: Patient at procedure or test/unavailable; patient going to OR for tibial fixation.  Will attempt again another day.   Reginia Naas 05/16/2017, 8:45 AM  Magda Kiel, [place] 05/16/2017

## 2017-05-16 NOTE — Interval H&P Note (Signed)
History and Physical Interval Note:  05/16/2017 8:44 AM  Vincent Apley Sr.  has presented today for surgery, with the diagnosis of Fracture/removal of ExFix   The various methods of treatment have been discussed with the patient and family. After consideration of risks, benefits and other options for treatment, the patient has consented to  Procedure(s): OPEN REDUCTION INTERNAL FIXATION (ORIF) LEFT DISTAL TIBIA FRACTURE, REMOVAL OF EX-FIX (Left) REMOVAL EXTERNAL FIXATION LEG (Left) as a surgical intervention .  The patient's history has been reviewed, patient examined, no change in status, stable for surgery.  I have reviewed the patient's chart and labs.  Questions were answered to the patient's satisfaction.     Datron Brakebill, Thomasene Lot

## 2017-05-16 NOTE — Progress Notes (Signed)
SLP Cancellation Note  Patient Details Name: Vincent CARREON Sr. MRN: [id number] DOB: [birth year]   Cancelled treatment:       Reason Eval/Treat Not Completed: Patient at procedure or test/unavailable (In surgery. Will f/u 9/7. )  [name], [name]  [name] 05/16/2017, 9:35 AM

## 2017-05-16 NOTE — Progress Notes (Signed)
OT Cancellation    05/16/17 0900  OT Visit Information  Last OT Received On 05/16/17  Reason Eval/Treat Not Completed Patient at procedure or test/ unavailable (surgery for tibial fixation)  [facility], OT/L  [phone] 05/16/2017

## 2017-05-16 NOTE — Anesthesia Procedure Notes (Signed)
Date/Time: 05/16/2017 8:58 AM Performed by: Ebbie Latus E Patient Re-evaluated:Patient Re-evaluated prior to induction Oxygen Delivery Method: Circle system utilized Induction Type: Combination inhalational/ intravenous induction and Tracheostomy

## 2017-05-16 NOTE — Anesthesia Postprocedure Evaluation (Signed)
Anesthesia Post Note  Patient: Vincent MIRABAL Sr.  Procedure(s) Performed: Procedure(s) (LRB): OPEN REDUCTION INTERNAL FIXATION (ORIF) LEFT DISTAL TIBIA FRACTURE, REMOVAL OF EX-FIX (Left) REMOVAL EXTERNAL FIXATION LEG (Left)     Patient location during evaluation: PACU Anesthesia Type: General Level of consciousness: awake and alert Pain management: pain level controlled Vital Signs Assessment: post-procedure vital signs reviewed and stable Respiratory status: spontaneous breathing, nonlabored ventilation, respiratory function stable and patient connected to nasal cannula oxygen Cardiovascular status: blood pressure returned to baseline and stable Postop Assessment: no signs of nausea or vomiting Anesthetic complications: no    Last Vitals:  Vitals:   05/16/17 1330 05/16/17 1345  BP: 135/82 132/85  Pulse: 100 100  Resp: 18 16  Temp: 36.9 C   SpO2: 92% 91%    Last Pain:  Vitals:   05/16/17 1330  TempSrc:   PainSc: 0-No pain                 Erice Ahles
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
## 2017-05-16 NOTE — Transfer of Care (Signed)
Immediate Anesthesia Transfer of Care Note  Patient: Vincent SWAYNE Sr.  Procedure(s) Performed: Procedure(s): OPEN REDUCTION INTERNAL FIXATION (ORIF) LEFT DISTAL TIBIA FRACTURE, REMOVAL OF EX-FIX (Left) REMOVAL EXTERNAL FIXATION LEG (Left)  Patient Location: PACU  Anesthesia Type:General  Level of Consciousness: awake, alert , oriented and sedated  Airway & Oxygen Therapy: Patient Spontanous Breathing and Patient connected to tracheostomy mask oxygen  Post-op Assessment: Report given to RN and Post -op Vital signs reviewed and stable  Post vital signs: Reviewed and stable  Last Vitals:  Vitals:   05/16/17 0802 05/16/17 1330  BP: 127/76   Pulse: 77   Resp: 14 (P) 18  Temp:  (P) 36.9 C  SpO2: 97% (P) 92%    Last Pain:  Vitals:   05/16/17 0400  TempSrc: Axillary  PainSc:          Complications: No apparent anesthesia complications
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
## 2017-05-16 NOTE — H&P (View-Only) (Signed)
Orthopedic Tech Progress Note Patient Details:  Vincent GLIDDEN Sr. [birth year] [id number]  Ortho Devices Type of Ortho Device: Abdominal binder Ortho Device/Splint Location: 2''and 3'' web wrap Ortho Device/Splint Interventions: Veleta Miners 05/14/2017, 1:25 PM

## 2017-05-17 ENCOUNTER — Encounter (HOSPITAL_COMMUNITY): Payer: Self-pay | Admitting: Neurosurgery

## 2017-05-17 LAB — GLUCOSE, CAPILLARY
GLUCOSE-CAPILLARY: 142 mg/dL — AB (ref 65–99)
GLUCOSE-CAPILLARY: 135 mg/dL — AB (ref 65–99)
Glucose-Capillary: 131 mg/dL — ABNORMAL HIGH (ref 65–99)
Glucose-Capillary: 133 mg/dL — ABNORMAL HIGH (ref 65–99)
Glucose-Capillary: 148 mg/dL — ABNORMAL HIGH (ref 65–99)
Glucose-Capillary: 182 mg/dL — ABNORMAL HIGH (ref 65–99)

## 2017-05-17 LAB — CBC
HCT: 28.4 % — ABNORMAL LOW (ref 39.0–52.0)
HEMOGLOBIN: 9 g/dL — AB (ref 13.0–17.0)
MCH: 27.4 pg (ref 26.0–34.0)
MCHC: 31.7 g/dL (ref 30.0–36.0)
MCV: 86.3 fL (ref 78.0–100.0)
PLATELETS: 407 10*3/uL — AB (ref 150–400)
RBC: 3.29 MIL/uL — AB (ref 4.22–5.81)
RDW: 15.6 % — ABNORMAL HIGH (ref 11.5–15.5)
WBC: 9.5 10*3/uL (ref 4.0–10.5)

## 2017-05-17 MED ORDER — FENTANYL BOLUS VIA INFUSION
25.0000 ug | INTRAVENOUS | Status: DC | PRN
  Filled 2017-05-17: qty 50

## 2017-05-17 MED ORDER — FENTANYL CITRATE (PF) 100 MCG/2ML IJ SOLN
25.0000 ug | INTRAMUSCULAR | Status: DC | PRN
  Administered 2017-05-20 – 2017-05-21 (×2): 50 ug via INTRAVENOUS
  Filled 2017-05-17 (×2): qty 2

## 2017-05-17 MED ORDER — ARTIFICIAL TEARS OPHTHALMIC OINT
TOPICAL_OINTMENT | Freq: Three times a day (TID) | OPHTHALMIC | Status: DC
  Administered 2017-05-17 (×2): via OPHTHALMIC
  Administered 2017-05-18 (×2): 1 via OPHTHALMIC
  Administered 2017-05-18: 22:00:00 via OPHTHALMIC
  Administered 2017-05-19 (×3): 1 via OPHTHALMIC
  Administered 2017-05-20: 14:00:00 via OPHTHALMIC
  Administered 2017-05-20: 1 via OPHTHALMIC
  Administered 2017-05-20 – 2017-05-21 (×2): via OPHTHALMIC
  Filled 2017-05-17: qty 3.5

## 2017-05-17 MED ORDER — TRAMADOL 5 MG/ML ORAL SUSPENSION: 50.0000 mg | Freq: Four times a day (QID) | ORAL | Status: DC

## 2017-05-17 MED ORDER — TRAMADOL HCL 50 MG PO TABS
50.0000 mg | ORAL_TABLET | Freq: Four times a day (QID) | ORAL | Status: DC
  Administered 2017-05-17 – 2017-05-21 (×17): 50 mg
  Filled 2017-05-17 (×17): qty 1

## 2017-05-17 NOTE — Progress Notes (Signed)
Orthopedic Tech Progress Note Patient Details:  Vincent KEAY Sr. 09-03-1966 [id number]  Patient ID: Glean Hess., male   DOB: 04-04-1966, 51 y.o.   MRN: [id number]   Hildred Priest 05/17/2017, 3:05 PMh Called in bio-tech brace order; spoke with Judeen Hammans

## 2017-05-17 NOTE — Consult Note (Signed)
OPHTHALMOLOGY CONSULT NOTE  Date: @CURDATE @ Time: 12:20 PM  Patient Name: Vincent TIETJE Sr.  DOB: [birth year] MRN: [id number]  Reason for Consult: Evaluate ocular motility after TBI    HPI:  This is a 51 y.o. AA male for whom ophthalmology was consulted to evaluate ocular motility. Pt  Is on a trach and has his mandible wire shut due to fracture and is only able to answer yes or no questions. The patient does endorse diplopia.    Prior to Admission medications   Medication Sig Start Date End Date Taking? Authorizing Provider  gabapentin (NEURONTIN) 400 MG capsule Take 400 mg by mouth 3 (three) times daily.   Yes [provider]  hydrOXYzine (ATARAX/VISTARIL) 25 MG tablet Take 25 mg by mouth daily as needed for anxiety (insomnia).   Yes [provider]  QUEtiapine (SEROQUEL) 300 MG tablet Take 300 mg by mouth at bedtime.   Yes [provider]  venlafaxine (EFFEXOR) 75 MG tablet Take 75 mg by mouth 2 (two) times daily with a meal.   Yes [provider]  vortioxetine HBr (TRINTELLIX) 5 MG TABS Take 5 mg by mouth daily.   Yes [provider]    Past Medical History:  Diagnosis Date  . Chronic back pain   . History of suicidal ideation 01/2017   with overdose/ [facility] admission  . Vitamin D deficiency     family history includes Hypertension in his father; Hypothyroidism in his mother.  Social History   Occupational History  . Not on file.   Social History Main Topics  . Smoking status: Unknown If Ever Smoked  . Smokeless tobacco: Not on file  . Alcohol use Not on file  . Drug use: Unknown  . Sexual activity: Not on file    No Known Allergies  ROS: Positive as above, otherwise negative.  EXAM:  Mental Status: A&O  Unable to communicate  Base Exam: Right Eye Left Eye  Visual Acuity (At near) FF FF  IOP ([place]) 19 23  Pupillary Exam No RAPD  No RAPD  Motility -4 in abduction full in adduction limited in up and down gaze -4  in all fields of gaze  Confrontation VF Unable Unable   Anterior Segment Exam    Lids/Lashes WNL Substantial ptosis, mildly tense orbit  Conjuctiva White and Quiet Chemosis  Cornea Clear Clear  Anterior Chamber Deep and Quiet Deep and Quiet  Iris Round, Reactive Round, Reactive  Lens Clear Clear  Vitreous WNL WNL   Poster Segment Exam -limited by paresis   Disc Sharp Sharp  CD ratio 0.6 0.6  Macula Flat Flat  Vessels WNL WNL  Periphery Attached Attached   Radiographic Studies Reviewed:  IMPRESSION: 1. Skullbase arachnoid hemorrhage with minimal blood in the posterior horn of the right lateral ventricle. No hydrocephalus. No skull fracture. 2. Comminuted left mandible fracture extending into the root to the posterior molars. Displaced tooth fragment adjacent to the fracture. 3. No fracture or subluxation of the cervical spine. Minimal skullbase hemorrhage tracks into the upper cervical canal.  Critical Value/emergent results were discussed in person at the time of the exam on 05/03/2017 at 4:38 am to Dr. Grandville Silos, who acknowledged these results.  Assessment and Recommendation: 1. Traumatic brain injury:  -Exam is limited but patient appears to have a bilateral 6th nerve palsy with a 3rd nerve palsy on the left given ptosis and opthalmoplegia, however, given tense orbit ptosis could be mechanical and given tense orbit neuromuscular paresis in  the orbit/s could be at play. Rec observation and evaluation after discharge.  2. Chemosis: Recommend artificial tear ointment every 3 hours until resolved.   Please call with any questions.  Jola Schmidt MD [facility] Ophthalmology [phone]
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
## 2017-05-17 NOTE — Progress Notes (Signed)
   Subjective:    Patient ID: Vincent Hess., male    DOB: [birth year], 51 y.o.   MRN: [id number]  HPI Up in chair.  Not on ventilator.  Following commands.  Review of Systems     Objective:   Physical Exam AF VSS Alert, NAD. MMF in tight occlusion. Trach site stable with cuffed tube in place. Left globe a bit less proptotic, conjunctiva remains edematous and erythematous, limited motion in all directions. Right globe looks good but does not abduct.    Assessment & Plan:  Tongue laceration, left mandible angle fracture, respiratory failure, left orbital cellulitis  May down-size trach tube to cuffless tube when ventilator certainly not needed.  Passy-Muir valve will work better with cuffless tube. Plan MMF for four weeks. Continue Unasyn for left orbital cellulitis.  Would recommend ophthalmology consultation to document thorough exam.
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
## 2017-05-18 LAB — CBC
HCT: 28 % — ABNORMAL LOW (ref 39.0–52.0)
Hemoglobin: 8.9 g/dL — ABNORMAL LOW (ref 13.0–17.0)
MCH: 27.4 pg (ref 26.0–34.0)
MCHC: 31.8 g/dL (ref 30.0–36.0)
MCV: 86.2 fL (ref 78.0–100.0)
PLATELETS: 513 10*3/uL — AB (ref 150–400)
RBC: 3.25 MIL/uL — ABNORMAL LOW (ref 4.22–5.81)
RDW: 15.9 % — AB (ref 11.5–15.5)
WBC: 12.9 10*3/uL — ABNORMAL HIGH (ref 4.0–10.5)

## 2017-05-18 LAB — GLUCOSE, CAPILLARY
GLUCOSE-CAPILLARY: 156 mg/dL — AB (ref 65–99)
GLUCOSE-CAPILLARY: 137 mg/dL — AB (ref 65–99)
GLUCOSE-CAPILLARY: 124 mg/dL — AB (ref 65–99)
Glucose-Capillary: 145 mg/dL — ABNORMAL HIGH (ref 65–99)
Glucose-Capillary: 121 mg/dL — ABNORMAL HIGH (ref 65–99)

## 2017-05-18 LAB — BASIC METABOLIC PANEL
ANION GAP: 7 (ref 5–15)
BUN: 13 mg/dL (ref 6–20)
CHLORIDE: 101 mmol/L (ref 101–111)
CO2: 27 mmol/L (ref 22–32)
Calcium: 8.6 mg/dL — ABNORMAL LOW (ref 8.9–10.3)
Creatinine, Ser: 0.44 mg/dL — ABNORMAL LOW (ref 0.61–1.24)
GFR calc Af Amer: >60 mL/min (ref >60)
GFR calc non Af Amer: >60 mL/min (ref >60)
GLUCOSE: 148 mg/dL — AB (ref 65–99)
POTASSIUM: 4.1 mmol/L (ref 3.5–5.1)
Sodium: 135 mmol/L (ref 135–145)

## 2017-05-18 MED ORDER — CLONAZEPAM 0.5 MG PO TBDP
0.5000 mg | ORAL_TABLET | Freq: Every day | ORAL | Status: DC
  Administered 2017-05-18 – 2017-05-20 (×3): 0.5 mg
  Filled 2017-05-18 (×3): qty 1

## 2017-05-18 NOTE — Progress Notes (Signed)
Patient ID: [name]., male   DOB: 10-22-1965, 51 y.o.   MRN: [id number] Patient awakened appears neurologically stable  Incision clean dry and intact  Continue cervical collar for now No new neurosurgical recommendations
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
## 2017-05-18 NOTE — Plan of Care (Signed)
Problem: Respiratory: Goal: Ability to maintain adequate ventilation will improve Outcome: Progressing Tolerated trach collar during entire day shift

## 2017-05-19 LAB — BASIC METABOLIC PANEL
Anion gap: 8 (ref 5–15)
BUN: 13 mg/dL (ref 6–20)
CHLORIDE: 101 mmol/L (ref 101–111)
CO2: 27 mmol/L (ref 22–32)
CREATININE: 0.54 mg/dL — AB (ref 0.61–1.24)
Calcium: 8.9 mg/dL (ref 8.9–10.3)
Glucose, Bld: 134 mg/dL — ABNORMAL HIGH (ref 65–99)
Potassium: 3.9 mmol/L (ref 3.5–5.1)
SODIUM: 136 mmol/L (ref 135–145)

## 2017-05-19 LAB — CBC
HEMATOCRIT: 29.2 % — AB (ref 39.0–52.0)
Hemoglobin: 9.1 g/dL — ABNORMAL LOW (ref 13.0–17.0)
MCH: 27.1 pg (ref 26.0–34.0)
MCHC: 31.2 g/dL (ref 30.0–36.0)
MCV: 86.9 fL (ref 78.0–100.0)
Platelets: 643 10*3/uL — ABNORMAL HIGH (ref 150–400)
RBC: 3.36 MIL/uL — ABNORMAL LOW (ref 4.22–5.81)
RDW: 16.3 % — AB (ref 11.5–15.5)
WBC: 12.2 10*3/uL — AB (ref 4.0–10.5)

## 2017-05-19 LAB — GLUCOSE, CAPILLARY
GLUCOSE-CAPILLARY: 135 mg/dL — AB (ref 65–99)
Glucose-Capillary: 110 mg/dL — ABNORMAL HIGH (ref 65–99)
Glucose-Capillary: 111 mg/dL — ABNORMAL HIGH (ref 65–99)
Glucose-Capillary: 122 mg/dL — ABNORMAL HIGH (ref 65–99)
Glucose-Capillary: 122 mg/dL — ABNORMAL HIGH (ref 65–99)
Glucose-Capillary: 123 mg/dL — ABNORMAL HIGH (ref 65–99)
Glucose-Capillary: 138 mg/dL — ABNORMAL HIGH (ref 65–99)

## 2017-05-19 LAB — MAGNESIUM: Magnesium: 2.3 mg/dL (ref 1.7–2.4)

## 2017-05-19 MED ORDER — CHLORHEXIDINE GLUCONATE CLOTH 2 % EX PADS
6.0000 | MEDICATED_PAD | Freq: Every day | CUTANEOUS | Status: DC
  Administered 2017-05-19 – 2017-05-20 (×3): 6 via TOPICAL
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
## 2017-05-19 NOTE — Progress Notes (Signed)
Patient ID: [name]., male   DOB: Apr 18, 1966, 51 y.o.   MRN: [id number] Awake alert no changes neurologically incision clean dry and intact, c-collar in place

## 2017-05-20 ENCOUNTER — Inpatient Hospital Stay (HOSPITAL_COMMUNITY): Payer: Medicaid Other

## 2017-05-20 LAB — GLUCOSE, CAPILLARY
GLUCOSE-CAPILLARY: 140 mg/dL — AB (ref 65–99)
GLUCOSE-CAPILLARY: 132 mg/dL — AB (ref 65–99)
GLUCOSE-CAPILLARY: 126 mg/dL — AB (ref 65–99)
Glucose-Capillary: 162 mg/dL — ABNORMAL HIGH (ref 65–99)
Glucose-Capillary: 120 mg/dL — ABNORMAL HIGH (ref 65–99)

## 2017-05-20 MED ORDER — HYDROXYZINE HCL 25 MG PO TABS: 25.0000 mg | ORAL_TABLET | Freq: Every day | ORAL | Status: DC | PRN

## 2017-05-20 MED ORDER — GABAPENTIN 400 MG PO CAPS
400.0000 mg | ORAL_CAPSULE | Freq: Three times a day (TID) | ORAL | Status: DC
  Administered 2017-05-20 – 2017-05-21 (×3): 400 mg via ORAL
  Filled 2017-05-20 (×3): qty 1

## 2017-05-20 MED ORDER — VENLAFAXINE HCL 75 MG PO TABS
75.0000 mg | ORAL_TABLET | Freq: Two times a day (BID) | ORAL | Status: DC
  Administered 2017-05-21: 75 mg via ORAL
  Filled 2017-05-20 (×4): qty 1
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
## 2017-05-20 NOTE — Progress Notes (Signed)
Patient ID: Vincent Pacella., male   DOB: 11/02/1965, 51 y.o.   MRN: [id number] [name] will be disabled for greater than 12 months.  Georganna Skeans, MD, MPH, FACS Trauma: [phone] General Surgery: [phone]

## 2017-05-20 NOTE — Progress Notes (Signed)
Placed PT on ATC set up.
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
## 2017-05-20 NOTE — Progress Notes (Signed)
Rehab admissions - I spoke with patient's wife by phone on Friday.  She would like inpatient rehab admission.  She works, but patient's mom and step daughters may assist while wife is working.  Pending bed availability and medical readiness, can potentially admit to inpatient rehab tomorrow.  Call me for questions.  #[phone]

## 2017-05-20 NOTE — Progress Notes (Signed)
Downsized patient to #6 cuffless tracheostomy tube without event.  [name]. [name], MD, [place] [phone] Trauma Surgeon
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
## 2017-05-20 NOTE — Clinical Social Work Note (Signed)
Clinical Social Worker following patient for possible SBIRT completion and discharge planning.  Patient is not appropriate to complete SBIRT at this time and is being evaluated for possible inpatient rehab admission.  CSW remains available for support to patient and family as needed.  Barbette Or, [place]

## 2017-05-21 ENCOUNTER — Inpatient Hospital Stay (HOSPITAL_COMMUNITY)
Admission: RE | Admit: 2017-05-21 | Discharge: 2017-07-19 | DRG: 981 | Disposition: A | Discharge status: Home Health | Payer: BC Managed Care – PPO | Source: Intra-hospital | Attending: Physical Medicine & Rehabilitation | Admitting: Physical Medicine & Rehabilitation

## 2017-05-21 ENCOUNTER — Encounter (HOSPITAL_COMMUNITY): Payer: Self-pay

## 2017-05-21 ENCOUNTER — Inpatient Hospital Stay (HOSPITAL_COMMUNITY)
Admission: RE | Admit: 2017-05-21 | Payer: No Typology Code available for payment source | Source: Intra-hospital | Admitting: Physical Medicine & Rehabilitation

## 2017-05-21 DIAGNOSIS — F1410 Cocaine abuse, uncomplicated: Secondary | ICD-10-CM | POA: Diagnosis present

## 2017-05-21 DIAGNOSIS — G894 Chronic pain syndrome: Secondary | ICD-10-CM

## 2017-05-21 DIAGNOSIS — Z981 Arthrodesis status: Secondary | ICD-10-CM

## 2017-05-21 DIAGNOSIS — S14104S Unspecified injury at C4 level of cervical spinal cord, sequela: Secondary | ICD-10-CM

## 2017-05-21 DIAGNOSIS — G8250 Quadriplegia, unspecified: Secondary | ICD-10-CM | POA: Diagnosis present

## 2017-05-21 DIAGNOSIS — R140 Abdominal distension (gaseous): Secondary | ICD-10-CM | POA: Diagnosis not present

## 2017-05-21 DIAGNOSIS — J988 Other specified respiratory disorders: Secondary | ICD-10-CM

## 2017-05-21 DIAGNOSIS — Z79899 Other long term (current) drug therapy: Secondary | ICD-10-CM

## 2017-05-21 DIAGNOSIS — H4923 Sixth [abducent] nerve palsy, bilateral: Secondary | ICD-10-CM | POA: Diagnosis present

## 2017-05-21 DIAGNOSIS — S2249XD Multiple fractures of ribs, unspecified side, subsequent encounter for fracture with routine healing: Secondary | ICD-10-CM | POA: Diagnosis not present

## 2017-05-21 DIAGNOSIS — S2241XD Multiple fractures of ribs, right side, subsequent encounter for fracture with routine healing: Secondary | ICD-10-CM

## 2017-05-21 DIAGNOSIS — S02609D Fracture of mandible, unspecified, subsequent encounter for fracture with routine healing: Secondary | ICD-10-CM | POA: Diagnosis not present

## 2017-05-21 DIAGNOSIS — S81012D Laceration without foreign body, left knee, subsequent encounter: Secondary | ICD-10-CM | POA: Diagnosis not present

## 2017-05-21 DIAGNOSIS — N319 Neuromuscular dysfunction of bladder, unspecified: Secondary | ICD-10-CM

## 2017-05-21 DIAGNOSIS — F1210 Cannabis abuse, uncomplicated: Secondary | ICD-10-CM | POA: Diagnosis present

## 2017-05-21 DIAGNOSIS — R109 Unspecified abdominal pain: Secondary | ICD-10-CM

## 2017-05-21 DIAGNOSIS — D62 Acute posthemorrhagic anemia: Secondary | ICD-10-CM | POA: Diagnosis present

## 2017-05-21 DIAGNOSIS — H4902 Third [oculomotor] nerve palsy, left eye: Secondary | ICD-10-CM | POA: Diagnosis present

## 2017-05-21 DIAGNOSIS — M4802 Spinal stenosis, cervical region: Secondary | ICD-10-CM | POA: Diagnosis present

## 2017-05-21 DIAGNOSIS — H11422 Conjunctival edema, left eye: Secondary | ICD-10-CM | POA: Diagnosis present

## 2017-05-21 DIAGNOSIS — N3289 Other specified disorders of bladder: Secondary | ICD-10-CM | POA: Diagnosis present

## 2017-05-21 DIAGNOSIS — Z4889 Encounter for other specified surgical aftercare: Secondary | ICD-10-CM

## 2017-05-21 DIAGNOSIS — F17210 Nicotine dependence, cigarettes, uncomplicated: Secondary | ICD-10-CM | POA: Diagnosis present

## 2017-05-21 DIAGNOSIS — K9423 Gastrostomy malfunction: Secondary | ICD-10-CM | POA: Diagnosis not present

## 2017-05-21 DIAGNOSIS — L03211 Cellulitis of face: Secondary | ICD-10-CM | POA: Diagnosis present

## 2017-05-21 DIAGNOSIS — A419 Sepsis, unspecified organism: Secondary | ICD-10-CM | POA: Diagnosis not present

## 2017-05-21 DIAGNOSIS — M25562 Pain in left knee: Secondary | ICD-10-CM | POA: Diagnosis not present

## 2017-05-21 DIAGNOSIS — S7222XD Displaced subtrochanteric fracture of left femur, subsequent encounter for closed fracture with routine healing: Secondary | ICD-10-CM | POA: Diagnosis not present

## 2017-05-21 DIAGNOSIS — S36113D Laceration of liver, unspecified degree, subsequent encounter: Secondary | ICD-10-CM

## 2017-05-21 DIAGNOSIS — J189 Pneumonia, unspecified organism: Secondary | ICD-10-CM | POA: Diagnosis not present

## 2017-05-21 DIAGNOSIS — R52 Pain, unspecified: Secondary | ICD-10-CM

## 2017-05-21 DIAGNOSIS — N390 Urinary tract infection, site not specified: Secondary | ICD-10-CM | POA: Diagnosis not present

## 2017-05-21 DIAGNOSIS — Z23 Encounter for immunization: Secondary | ICD-10-CM | POA: Diagnosis not present

## 2017-05-21 DIAGNOSIS — S062X9A Diffuse traumatic brain injury with loss of consciousness of unspecified duration, initial encounter: Secondary | ICD-10-CM | POA: Diagnosis present

## 2017-05-21 DIAGNOSIS — Y838 Other surgical procedures as the cause of abnormal reaction of the patient, or of later complication, without mention of misadventure at the time of the procedure: Secondary | ICD-10-CM | POA: Diagnosis not present

## 2017-05-21 DIAGNOSIS — S82832E Other fracture of upper and lower end of left fibula, subsequent encounter for open fracture type I or II with routine healing: Secondary | ICD-10-CM

## 2017-05-21 DIAGNOSIS — M542 Cervicalgia: Secondary | ICD-10-CM

## 2017-05-21 DIAGNOSIS — S02113D Unspecified occipital condyle fracture, subsequent encounter for fracture with routine healing: Secondary | ICD-10-CM

## 2017-05-21 DIAGNOSIS — R51 Headache: Secondary | ICD-10-CM | POA: Diagnosis present

## 2017-05-21 DIAGNOSIS — K567 Ileus, unspecified: Secondary | ICD-10-CM

## 2017-05-21 DIAGNOSIS — Z973 Presence of spectacles and contact lenses: Secondary | ICD-10-CM

## 2017-05-21 DIAGNOSIS — R740 Nonspecific elevation of levels of transaminase and lactic acid dehydrogenase [LDH]: Secondary | ICD-10-CM | POA: Diagnosis present

## 2017-05-21 DIAGNOSIS — R079 Chest pain, unspecified: Secondary | ICD-10-CM

## 2017-05-21 DIAGNOSIS — R7401 Elevation of levels of liver transaminase levels: Secondary | ICD-10-CM

## 2017-05-21 DIAGNOSIS — Z931 Gastrostomy status: Secondary | ICD-10-CM

## 2017-05-21 DIAGNOSIS — F331 Major depressive disorder, recurrent, moderate: Secondary | ICD-10-CM

## 2017-05-21 DIAGNOSIS — F419 Anxiety disorder, unspecified: Secondary | ICD-10-CM | POA: Diagnosis not present

## 2017-05-21 DIAGNOSIS — B965 Pseudomonas (aeruginosa) (mallei) (pseudomallei) as the cause of diseases classified elsewhere: Secondary | ICD-10-CM | POA: Diagnosis not present

## 2017-05-21 DIAGNOSIS — S066X9D Traumatic subarachnoid hemorrhage with loss of consciousness of unspecified duration, subsequent encounter: Secondary | ICD-10-CM

## 2017-05-21 DIAGNOSIS — M21372 Foot drop, left foot: Secondary | ICD-10-CM | POA: Diagnosis present

## 2017-05-21 DIAGNOSIS — T148XXA Other injury of unspecified body region, initial encounter: Secondary | ICD-10-CM

## 2017-05-21 DIAGNOSIS — M79672 Pain in left foot: Secondary | ICD-10-CM | POA: Diagnosis not present

## 2017-05-21 DIAGNOSIS — S82202E Unspecified fracture of shaft of left tibia, subsequent encounter for open fracture type I or II with routine healing: Secondary | ICD-10-CM | POA: Diagnosis not present

## 2017-05-21 DIAGNOSIS — S01512D Laceration without foreign body of oral cavity, subsequent encounter: Secondary | ICD-10-CM

## 2017-05-21 DIAGNOSIS — L03213 Periorbital cellulitis: Secondary | ICD-10-CM | POA: Diagnosis present

## 2017-05-21 DIAGNOSIS — Z930 Tracheostomy status: Secondary | ICD-10-CM

## 2017-05-21 DIAGNOSIS — Z915 Personal history of self-harm: Secondary | ICD-10-CM

## 2017-05-21 DIAGNOSIS — R197 Diarrhea, unspecified: Secondary | ICD-10-CM | POA: Diagnosis not present

## 2017-05-21 LAB — GLUCOSE, CAPILLARY
GLUCOSE-CAPILLARY: 113 mg/dL — AB (ref 65–99)
GLUCOSE-CAPILLARY: 114 mg/dL — AB (ref 65–99)
GLUCOSE-CAPILLARY: 131 mg/dL — AB (ref 65–99)
GLUCOSE-CAPILLARY: 138 mg/dL — AB (ref 65–99)
Glucose-Capillary: 113 mg/dL — ABNORMAL HIGH (ref 65–99)
Glucose-Capillary: 119 mg/dL — ABNORMAL HIGH (ref 65–99)
Glucose-Capillary: 140 mg/dL — ABNORMAL HIGH (ref 65–99)

## 2017-05-21 LAB — URINALYSIS, ROUTINE W REFLEX MICROSCOPIC
Bilirubin Urine: NEGATIVE
GLUCOSE, UA: NEGATIVE mg/dL
KETONES UR: NEGATIVE mg/dL
Leukocytes, UA: NEGATIVE
NITRITE: NEGATIVE
PROTEIN: 100 mg/dL — AB
SQUAMOUS EPITHELIAL / LPF: NONE SEEN
Specific Gravity, Urine: 1.023 (ref 1.005–1.030)
pH: 7 (ref 5.0–8.0)

## 2017-05-21 MED ORDER — CHLORHEXIDINE GLUCONATE CLOTH 2 % EX PADS: 6.0000 | MEDICATED_PAD | Freq: Every day | CUTANEOUS | Status: DC

## 2017-05-21 MED ORDER — SODIUM CHLORIDE 0.9% FLUSH
10.0000 mL | Freq: Two times a day (BID) | INTRAVENOUS | Status: DC
Start: 2017-05-21 — End: 2017-05-27

## 2017-05-21 MED ORDER — ENOXAPARIN SODIUM 40 MG/0.4ML ~~LOC~~ SOLN
40.0000 mg | SUBCUTANEOUS | Status: DC
  Administered 2017-05-22 – 2017-07-19 (×59): 40 mg via SUBCUTANEOUS
  Filled 2017-05-21 (×58): qty 0.4

## 2017-05-21 MED ORDER — PROCHLORPERAZINE 25 MG RE SUPP
12.5000 mg | Freq: Four times a day (QID) | RECTAL | Status: DC | PRN
  Filled 2017-05-21: qty 1

## 2017-05-21 MED ORDER — POLYETHYLENE GLYCOL 3350 17 G PO PACK
17.0000 g | PACK | Freq: Every day | ORAL | Status: DC
  Administered 2017-05-22 – 2017-06-05 (×9): 17 g
  Filled 2017-05-21 (×12): qty 1

## 2017-05-21 MED ORDER — HYDROCODONE-ACETAMINOPHEN 7.5-325 MG/15ML PO SOLN
10.0000 mL | ORAL | Status: DC | PRN
  Administered 2017-05-21 – 2017-05-31 (×18): 10 mL
  Filled 2017-05-21 (×19): qty 15

## 2017-05-21 MED ORDER — TRAZODONE HCL 50 MG PO TABS
25.0000 mg | ORAL_TABLET | Freq: Every evening | ORAL | Status: DC | PRN
  Administered 2017-05-21 – 2017-07-10 (×20): 50 mg via ORAL
  Filled 2017-05-21 (×20): qty 1

## 2017-05-21 MED ORDER — BISACODYL 10 MG RE SUPP
10.0000 mg | Freq: Every day | RECTAL | Status: DC
  Administered 2017-05-23 – 2017-06-05 (×11): 10 mg via RECTAL
  Filled 2017-05-21 (×17): qty 1

## 2017-05-21 MED ORDER — PROCHLORPERAZINE MALEATE 5 MG PO TABS: 5.0000 mg | ORAL_TABLET | Freq: Four times a day (QID) | ORAL | Status: DC | PRN

## 2017-05-21 MED ORDER — METHOCARBAMOL 500 MG PO TABS
500.0000 mg | ORAL_TABLET | Freq: Four times a day (QID) | ORAL | Status: DC | PRN
  Administered 2017-05-21 – 2017-05-22 (×3): 500 mg
  Filled 2017-05-21 (×3): qty 1

## 2017-05-21 MED ORDER — QUETIAPINE FUMARATE 50 MG PO TABS
50.0000 mg | ORAL_TABLET | Freq: Two times a day (BID) | ORAL | Status: DC
  Administered 2017-05-21 – 2017-06-17 (×54): 50 mg
  Filled 2017-05-21 (×12): qty 1
  Filled 2017-05-21: qty 2
  Filled 2017-05-21 (×42): qty 1

## 2017-05-21 MED ORDER — CHLORHEXIDINE GLUCONATE CLOTH 2 % EX PADS
6.0000 | MEDICATED_PAD | Freq: Every day | CUTANEOUS | Status: DC
  Administered 2017-05-22 – 2017-05-26 (×5): 6 via TOPICAL

## 2017-05-21 MED ORDER — GABAPENTIN 400 MG PO CAPS
400.0000 mg | ORAL_CAPSULE | Freq: Three times a day (TID) | ORAL | Status: DC
  Administered 2017-05-21 – 2017-06-17 (×81): 400 mg
  Filled 2017-05-21 (×82): qty 1

## 2017-05-21 MED ORDER — PRO-STAT SUGAR FREE PO LIQD
30.0000 mL | Freq: Every day | ORAL | Status: DC
  Administered 2017-05-22: 30 mL
  Filled 2017-05-21: qty 30

## 2017-05-21 MED ORDER — ALUM & MAG HYDROXIDE-SIMETH 200-200-20 MG/5ML PO SUSP
30.0000 mL | ORAL | Status: DC | PRN
  Administered 2017-06-03 – 2017-06-17 (×3): 30 mL via ORAL
  Filled 2017-05-21 (×3): qty 30

## 2017-05-21 MED ORDER — WHITE PETROLATUM GEL
Status: AC
  Administered 2017-05-21: 17:00:00
  Filled 2017-05-21: qty 1

## 2017-05-21 MED ORDER — BISACODYL 10 MG RE SUPP
10.0000 mg | Freq: Every day | RECTAL | Status: DC | PRN
  Administered 2017-06-15 – 2017-07-11 (×2): 10 mg via RECTAL
  Filled 2017-05-21 (×3): qty 1

## 2017-05-21 MED ORDER — FLEET ENEMA 7-19 GM/118ML RE ENEM: 1.0000 | ENEMA | Freq: Once | RECTAL | Status: DC | PRN

## 2017-05-21 MED ORDER — DIPHENHYDRAMINE HCL 12.5 MG/5ML PO ELIX: 12.5000 mg | ORAL_SOLUTION | Freq: Four times a day (QID) | ORAL | Status: DC | PRN

## 2017-05-21 MED ORDER — ONDANSETRON 4 MG PO TBDP
4.0000 mg | ORAL_TABLET | Freq: Four times a day (QID) | ORAL | Status: DC | PRN
  Filled 2017-05-21: qty 1

## 2017-05-21 MED ORDER — SODIUM CHLORIDE 0.9 % IV SOLN
1.5000 g | Freq: Four times a day (QID) | INTRAVENOUS | Status: DC
  Administered 2017-05-21 – 2017-05-23 (×7): 1.5 g via INTRAVENOUS
  Filled 2017-05-21 (×8): qty 1.5

## 2017-05-21 MED ORDER — ORAL CARE MOUTH RINSE
15.0000 mL | OROMUCOSAL | Status: DC
  Administered 2017-05-21 – 2017-05-30 (×72): 15 mL via OROMUCOSAL

## 2017-05-21 MED ORDER — ARTIFICIAL TEARS OPHTHALMIC OINT
TOPICAL_OINTMENT | Freq: Three times a day (TID) | OPHTHALMIC | Status: DC
  Administered 2017-05-21 – 2017-05-22 (×4): via OPHTHALMIC
  Filled 2017-05-21: qty 3.5

## 2017-05-21 MED ORDER — CLONAZEPAM 0.5 MG PO TBDP
0.5000 mg | ORAL_TABLET | Freq: Every day | ORAL | Status: DC
  Administered 2017-05-21 – 2017-06-16 (×27): 0.5 mg
  Filled 2017-05-21 (×27): qty 1

## 2017-05-21 MED ORDER — ADULT MULTIVITAMIN LIQUID CH
15.0000 mL | Freq: Every day | ORAL | Status: DC
  Administered 2017-05-22 – 2017-06-17 (×27): 15 mL
  Filled 2017-05-21 (×28): qty 15

## 2017-05-21 MED ORDER — CHLORHEXIDINE GLUCONATE 0.12% ORAL RINSE (MEDLINE KIT)
15.0000 mL | Freq: Two times a day (BID) | OROMUCOSAL | Status: DC
  Administered 2017-05-21 – 2017-06-15 (×41): 15 mL via OROMUCOSAL
  Filled 2017-05-21: qty 15

## 2017-05-21 MED ORDER — ACETAMINOPHEN 160 MG/5ML PO SOLN
325.0000 mg | ORAL | Status: DC | PRN
  Administered 2017-05-21 – 2017-07-04 (×19): 650 mg via ORAL
  Filled 2017-05-21 (×20): qty 20.3

## 2017-05-21 MED ORDER — TRAMADOL HCL 50 MG PO TABS
50.0000 mg | ORAL_TABLET | Freq: Four times a day (QID) | ORAL | Status: DC
  Administered 2017-05-21 – 2017-06-17 (×107): 50 mg
  Filled 2017-05-21 (×108): qty 1

## 2017-05-21 MED ORDER — PROCHLORPERAZINE EDISYLATE 5 MG/ML IJ SOLN: 5.0000 mg | Freq: Four times a day (QID) | INTRAMUSCULAR | Status: DC | PRN

## 2017-05-21 MED ORDER — VENLAFAXINE HCL 75 MG PO TABS
75.0000 mg | ORAL_TABLET | Freq: Two times a day (BID) | ORAL | Status: DC
  Administered 2017-05-21 – 2017-06-17 (×54): 75 mg
  Filled 2017-05-21 (×55): qty 1

## 2017-05-21 MED ORDER — INSULIN ASPART 100 UNIT/ML ~~LOC~~ SOLN
0.0000 [IU] | SUBCUTANEOUS | Status: DC
  Administered 2017-05-21 – 2017-05-25 (×13): 1 [IU] via SUBCUTANEOUS
  Administered 2017-05-25: 2 [IU] via SUBCUTANEOUS
  Administered 2017-05-25 – 2017-05-28 (×10): 1 [IU] via SUBCUTANEOUS
  Administered 2017-05-29: 2 [IU] via SUBCUTANEOUS
  Administered 2017-05-29 – 2017-05-30 (×3): 1 [IU] via SUBCUTANEOUS
  Administered 2017-05-31: 2 [IU] via SUBCUTANEOUS
  Administered 2017-05-31 – 2017-06-01 (×4): 1 [IU] via SUBCUTANEOUS
  Administered 2017-06-01: 2 [IU] via SUBCUTANEOUS
  Administered 2017-06-01 – 2017-06-03 (×9): 1 [IU] via SUBCUTANEOUS
  Administered 2017-06-03: 2 [IU] via SUBCUTANEOUS
  Administered 2017-06-03 – 2017-06-05 (×9): 1 [IU] via SUBCUTANEOUS
  Administered 2017-06-07: 2 [IU] via SUBCUTANEOUS
  Administered 2017-06-07 – 2017-06-10 (×2): 1 [IU] via SUBCUTANEOUS
  Administered 2017-06-11: 2 [IU] via SUBCUTANEOUS

## 2017-05-21 MED ORDER — ONDANSETRON HCL 4 MG/2ML IJ SOLN: 4.0000 mg | Freq: Four times a day (QID) | INTRAMUSCULAR | Status: DC | PRN

## 2017-05-21 MED ORDER — PIVOT 1.5 CAL PO LIQD
1000.0000 mL | ORAL | Status: DC
  Administered 2017-05-21 – 2017-05-22 (×2): 1000 mL
  Filled 2017-05-21 (×3): qty 1000

## 2017-05-21 MED ORDER — HYDROXYZINE HCL 25 MG PO TABS
25.0000 mg | ORAL_TABLET | Freq: Every day | ORAL | Status: DC | PRN
  Administered 2017-05-21: 25 mg
  Filled 2017-05-21: qty 1

## 2017-05-21 MED ORDER — PIVOT 1.5 CAL PO LIQD
1000.0000 mL | ORAL | Status: DC
  Filled 2017-05-21: qty 1000

## 2017-05-21 MED ORDER — SODIUM CHLORIDE 0.9% FLUSH: 10.0000 mL | INTRAVENOUS | Status: DC | PRN

## 2017-05-21 MED ORDER — GUAIFENESIN-DM 100-10 MG/5ML PO SYRP: 5.0000 mL | ORAL_SOLUTION | Freq: Four times a day (QID) | ORAL | Status: DC | PRN

## 2017-05-21 MED ORDER — PANTOPRAZOLE SODIUM 40 MG PO PACK
40.0000 mg | PACK | Freq: Every day | ORAL | Status: DC
  Administered 2017-05-22 – 2017-06-17 (×27): 40 mg
  Filled 2017-05-21 (×14): qty 20

## 2017-05-21 MED ORDER — ACETAMINOPHEN 325 MG PO TABS: 325.0000 mg | ORAL_TABLET | ORAL | Status: DC | PRN

## 2017-05-21 MED ORDER — GUAIFENESIN-DM 100-10 MG/5ML PO SYRP
5.0000 mL | ORAL_SOLUTION | Freq: Four times a day (QID) | ORAL | Status: DC | PRN
  Administered 2017-05-22: 10 mL
  Filled 2017-05-21: qty 10
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
## 2017-05-21 NOTE — Progress Notes (Signed)
Rehab admissions - I met with patient at the bedside.  He is speaking with PMV in place today.  He is ready for inpatient rehab today per trauma team.  Bed available and will admit to acute inpatient rehab today.  Call me for questions.  #[phone] 
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
## 2017-05-21 NOTE — Clinical Social Work Note (Signed)
Clinical Social Worker following patient for discharge planning.  Patient has been accepted to inpatient rehab with plans to transfer today.  Clinical Social Worker will sign off for now as social work intervention is no longer needed. Please consult [place] again if new need arises.  Barbette Or, [place]
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
## 2017-05-22 ENCOUNTER — Inpatient Hospital Stay (HOSPITAL_COMMUNITY): Payer: BC Managed Care – PPO | Admitting: Occupational Therapy

## 2017-05-22 ENCOUNTER — Inpatient Hospital Stay (HOSPITAL_COMMUNITY): Payer: BC Managed Care – PPO

## 2017-05-22 ENCOUNTER — Encounter (HOSPITAL_COMMUNITY): Payer: Self-pay | Admitting: Student

## 2017-05-22 ENCOUNTER — Inpatient Hospital Stay (HOSPITAL_COMMUNITY): Payer: Self-pay | Admitting: Physical Therapy

## 2017-05-22 ENCOUNTER — Inpatient Hospital Stay (HOSPITAL_COMMUNITY): Payer: Self-pay | Admitting: Speech Pathology

## 2017-05-22 DIAGNOSIS — D473 Essential (hemorrhagic) thrombocythemia: Secondary | ICD-10-CM

## 2017-05-22 DIAGNOSIS — D62 Acute posthemorrhagic anemia: Secondary | ICD-10-CM

## 2017-05-22 LAB — CBC WITH DIFFERENTIAL/PLATELET
BASOS PCT: 0 %
Basophils Absolute: 0 10*3/uL (ref 0.0–0.1)
EOS ABS: 0.2 10*3/uL (ref 0.0–0.7)
EOS PCT: 2 %
HCT: 30.2 % — ABNORMAL LOW (ref 39.0–52.0)
HEMOGLOBIN: 9.4 g/dL — AB (ref 13.0–17.0)
Lymphocytes Relative: 26 %
Lymphs Abs: 2.7 10*3/uL (ref 0.7–4.0)
MCH: 27.1 pg (ref 26.0–34.0)
MCHC: 31.1 g/dL (ref 30.0–36.0)
MCV: 87 fL (ref 78.0–100.0)
MONO ABS: 0.8 10*3/uL (ref 0.1–1.0)
MONOS PCT: 8 %
NEUTROS PCT: 64 %
Neutro Abs: 6.6 10*3/uL (ref 1.7–7.7)
Platelets: 651 10*3/uL — ABNORMAL HIGH (ref 150–400)
RBC: 3.47 MIL/uL — ABNORMAL LOW (ref 4.22–5.81)
RDW: 16 % — AB (ref 11.5–15.5)
WBC: 10.4 10*3/uL (ref 4.0–10.5)

## 2017-05-22 LAB — COMPREHENSIVE METABOLIC PANEL
ALK PHOS: 227 U/L — AB (ref 38–126)
ALT: 111 U/L — ABNORMAL HIGH (ref 17–63)
ANION GAP: 6 (ref 5–15)
AST: 37 U/L (ref 15–41)
Albumin: 2.5 g/dL — ABNORMAL LOW (ref 3.5–5.0)
BUN: 23 mg/dL — AB (ref 6–20)
CALCIUM: 9.1 mg/dL (ref 8.9–10.3)
CO2: 27 mmol/L (ref 22–32)
Chloride: 104 mmol/L (ref 101–111)
Creatinine, Ser: 0.52 mg/dL — ABNORMAL LOW (ref 0.61–1.24)
GFR calc Af Amer: >60 mL/min (ref >60)
Glucose, Bld: 128 mg/dL — ABNORMAL HIGH (ref 65–99)
POTASSIUM: 4 mmol/L (ref 3.5–5.1)
Sodium: 137 mmol/L (ref 135–145)
TOTAL PROTEIN: 7 g/dL (ref 6.5–8.1)
Total Bilirubin: 0.6 mg/dL (ref 0.3–1.2)

## 2017-05-22 LAB — HEMOGLOBIN A1C
HEMOGLOBIN A1C: 5.7 % — AB (ref 4.8–5.6)
Mean Plasma Glucose: 116.89 mg/dL

## 2017-05-22 LAB — GLUCOSE, CAPILLARY
GLUCOSE-CAPILLARY: 120 mg/dL — AB (ref 65–99)
GLUCOSE-CAPILLARY: 114 mg/dL — AB (ref 65–99)
Glucose-Capillary: 135 mg/dL — ABNORMAL HIGH (ref 65–99)
Glucose-Capillary: 139 mg/dL — ABNORMAL HIGH (ref 65–99)
Glucose-Capillary: 121 mg/dL — ABNORMAL HIGH (ref 65–99)

## 2017-05-22 MED ORDER — FREE WATER
200.0000 mL | Freq: Three times a day (TID) | Status: DC
  Administered 2017-05-22 (×2): 200 mL

## 2017-05-22 MED ORDER — POLYVINYL ALCOHOL 1.4 % OP SOLN
1.0000 [drp] | Freq: Three times a day (TID) | OPHTHALMIC | Status: DC | PRN
  Administered 2017-05-23 – 2017-06-11 (×16): 1 [drp] via OPHTHALMIC
  Filled 2017-05-22 (×2): qty 15

## 2017-05-22 MED ORDER — FREE WATER
225.0000 mL | Freq: Four times a day (QID) | Status: DC
  Administered 2017-05-22 – 2017-06-17 (×101): 225 mL

## 2017-05-22 MED ORDER — ARTIFICIAL TEARS OPHTHALMIC OINT
TOPICAL_OINTMENT | OPHTHALMIC | Status: DC
  Administered 2017-05-23 (×6): via OPHTHALMIC
  Administered 2017-05-24: 1 via OPHTHALMIC
  Administered 2017-05-24 (×4): via OPHTHALMIC
  Administered 2017-05-24: 1 via OPHTHALMIC
  Administered 2017-05-24 – 2017-05-26 (×11): via OPHTHALMIC
  Administered 2017-05-26: 1 via OPHTHALMIC
  Administered 2017-05-26 – 2017-06-03 (×63): via OPHTHALMIC
  Administered 2017-06-04: 1 via OPHTHALMIC
  Administered 2017-06-04 – 2017-06-07 (×24): via OPHTHALMIC
  Administered 2017-06-07: 1 via OPHTHALMIC
  Administered 2017-06-07 – 2017-06-13 (×41): via OPHTHALMIC
  Filled 2017-05-22 (×2): qty 3.5

## 2017-05-22 MED ORDER — PRO-STAT SUGAR FREE PO LIQD
30.0000 mL | Freq: Three times a day (TID) | ORAL | Status: DC
  Administered 2017-05-22 – 2017-06-17 (×77): 30 mL
  Filled 2017-05-22 (×77): qty 30

## 2017-05-22 MED ORDER — JEVITY 1.5 CAL/FIBER PO LIQD
1000.0000 mL | ORAL | Status: DC
  Administered 2017-05-22 – 2017-05-28 (×4): 1000 mL
  Filled 2017-05-22 (×11): qty 1000

## 2017-05-22 NOTE — Progress Notes (Signed)
Patient information reviewed and entered into [facility] System by Becky Yuvin Bussiere, covering PPS coordinator. Information including medical coding and functional independence measure will be reviewed and updated through discharge.   
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
## 2017-05-22 NOTE — Progress Notes (Signed)
[place] PHYSICAL MEDICINE & REHABILITATION     PROGRESS NOTE    Subjective/Complaints:   Objective: Vital Signs: Blood pressure 125/75, pulse 81, temperature 98.3 F (36.8 C), temperature source Oral, resp. rate 16, weight 97.8 kg (215 lb 9.6 oz), SpO2 96 %. Dg Elbow 2 Views Right  Result Date: 05/20/2017 CLINICAL DATA:  Pain with moving of the elbow.  No obvious injury. EXAM: RIGHT ELBOW - 2 VIEW COMPARISON:  None. FINDINGS: There is no evidence of acute fracture, dislocation or joint effusion. Minimal spurring is noted off the coronoid process of the ulna and at the triceps insertion. Mild soft tissue induration along the dorsal aspect of the included forearm. IMPRESSION: Minimal degenerative spurring of the olecranon at the triceps insertion and off the coronoid process. No acute fracture nor dislocation. Mild nonspecific soft tissue induration along the posterior aspect of the forearm. Electronically Signed   By: Ashley Royalty M.D.   On: 05/20/2017 22:22    Recent Labs  05/22/17 0452  WBC 10.4  HGB 9.4*  HCT 30.2*  PLT 651*    Recent Labs  05/22/17 0452  NA 137  K 4.0  CL 104  GLUCOSE 128*  BUN 23*  CREATININE 0.52*  CALCIUM 9.1   CBG (last 3)   Recent Labs  05/21/17 2356 05/22/17 0420 05/22/17 0808  GLUCAP 138* 120* 135*    Wt Readings from Last 3 Encounters:  05/22/17 97.8 kg (215 lb 9.6 oz)  05/21/17 99 kg (218 lb 3.2 oz)  01/11/17 91.6 kg (202 lb)    Physical Exam:  Constitutional: He appears well-developedand well-nourished. He is in no distress.  HENT:  Head: Normocephalicand atraumatic.  Jaws wired.  Eyes:  Unable to move right eye laterally. Left eye with extropion/ injected.  Neck:  Immobilized by Aspen collar. Keeps head turned to left and difficult to move head to midline.  Cardiovascular: sl tachy.   Respiratory: decreased breath sounds, no distress  GI: Soft. He exhibits distension. Bowel sounds are decreased. There is  tenderness.  Genitourinary:  Genitourinary Comments: Foley in place. Incontinent of bowel.  Musculoskeletal: He exhibits edema(MIn edema bilateral hands. ).  Min edema left thigh with surgical dressing on left hip. Left ankle splinted. CAM boot in place on right foot.  Neurological: He is easily aroused. He appears alert. Follows simple commands.  Motor 1-2/5 UE but inconsistent. Some movement (1/5) in Proximal LE's,   inconsistent, braces/splint in place. Does sense deep pain stim. Left III, VI nerve weakness. Skin: Skin is warmand dry.  Scattered abrasions along the left leg. Also with wounds along arms,chest.  Psychiatric:  Fairly pleasant and appropriate  Assessment/Plan: 1. Functional deficits, tetraplegia secondary to TBI/cervical SCI which require 3+ hours per day of interdisciplinary therapy in a comprehensive inpatient rehab setting. Physiatrist is providing close team supervision and 24 hour management of active medical problems listed below. Physiatrist and rehab team continue to assess barriers to discharge/monitor patient progress toward functional and medical goals.  Function:  Bathing Bathing position      Bathing parts      Bathing assist        Upper Body Dressing/Undressing Upper body dressing                    Upper body assist        Lower Body Dressing/Undressing Lower body dressing  Lower body assist        Toileting Toileting          Toileting assist     Transfers Chair/bed Clinical biochemist          Cognition Comprehension Comprehension assist level: Understands basic 25 - 49% of the time/ requires cueing 50 - 75% of the time  Expression Expression assist level: Expresses basic 25 - 49% of the time/requires cueing 50 - 75% of the time. Uses single words/gestures.  Social Interaction Social Interaction assist level:  Interacts appropriately 50 - 74% of the time - May be physically or verbally inappropriate.  Problem Solving Problem solving assist level: Solves basic less than 25% of the time - needs direction nearly all the time or does not effectively solve problems and may need a restraint for safety  Memory Memory assist level: Recognizes or recalls 25 - 49% of the time/requires cueing 50 - 75% of the time   Medical Problem List and Plan: 1. Functional, cognitive and mobility deficitssecondary to Moderate TBI, cervical spinal cord injury -begin therapies today 2. DVT Prophylaxis/Anticoagulation: Pharmaceutical: Lovenox 3. Chronic neck pain/Pain Management: On Gabapentin bid with hycet prn for pain 4. Mood: LCSW to follow for evaluation and support when appropriate.  5. Neuropsych: This patient is notcapable of making decisions on hisown behalf. 6. Skin/Wound Care: Air mattress overlay. Maintain adequate nutritional and hydration status.  7. Fluids/Electrolytes/Nutrition: Monitor I/O.   -I personally reviewed the patient's labs today.   8. SCI with quadriparesis: unclear level at this point. Will be able to discern as he's up and moving more 9. Left periorbital cellulitis/ CN III/VI palsy: 10Left femur fracture s/p IM nail:NWB X 8 weeks. 11. Left open tibial fracture s/p I and D with ORIF: NWB--splint to be changed next week with plans for CAM boot.  11. Left mandibular fracture s/p CR /tongue laceration: Downsized to CFS # 6 on 05/18/11. To leave wires in place for 4 weeks from 8/29? .  12. Chemosis left eye: Lacrilube every 3 hours till resolved per opthalmology.  13. Facial cellulitis: On Unasyn D # 6 14. Leucocytosis: Monitor for signs of infection. 15. Thrombocytosis: Likely reactive. Dopplers today. Continue lovenox.  16. ABLA: hgb stable at 9.4  17. Abnormal LFTs: Question due to shocked liver.  22. H/o of MDD: Was on Trintellix 5 mg and Risperidone 5 mg bid at  home--continue to hold for now.    LOS (Days) 1 A [place] T, MD 05/22/2017 9:14 AM
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
## 2017-05-23 ENCOUNTER — Inpatient Hospital Stay (HOSPITAL_COMMUNITY): Payer: Self-pay | Admitting: Physical Therapy

## 2017-05-23 ENCOUNTER — Inpatient Hospital Stay (HOSPITAL_COMMUNITY): Payer: Self-pay

## 2017-05-23 ENCOUNTER — Inpatient Hospital Stay (HOSPITAL_COMMUNITY): Payer: BC Managed Care – PPO

## 2017-05-23 ENCOUNTER — Encounter (HOSPITAL_COMMUNITY): Payer: Self-pay | Admitting: Speech Pathology

## 2017-05-23 ENCOUNTER — Encounter (HOSPITAL_COMMUNITY): Payer: Self-pay

## 2017-05-23 LAB — GLUCOSE, CAPILLARY
GLUCOSE-CAPILLARY: 106 mg/dL — AB (ref 65–99)
GLUCOSE-CAPILLARY: 123 mg/dL — AB (ref 65–99)
GLUCOSE-CAPILLARY: 140 mg/dL — AB (ref 65–99)
Glucose-Capillary: 115 mg/dL — ABNORMAL HIGH (ref 65–99)
Glucose-Capillary: 129 mg/dL — ABNORMAL HIGH (ref 65–99)
Glucose-Capillary: 95 mg/dL (ref 65–99)
Glucose-Capillary: 130 mg/dL — ABNORMAL HIGH (ref 65–99)

## 2017-05-23 LAB — URINE CULTURE: Culture: NO GROWTH

## 2017-05-23 MED ORDER — CEPHALEXIN 250 MG/5ML PO SUSR
500.0000 mg | Freq: Three times a day (TID) | ORAL | Status: AC
  Administered 2017-05-23 – 2017-05-30 (×23): 500 mg
  Filled 2017-05-23 (×27): qty 10

## 2017-05-23 NOTE — Progress Notes (Signed)
Physical Therapy Session Note  Patient Details  Name: [name] MRN: [id number] Date of Birth: Jan 15, 1966  Today's Date: 05/23/2017 PT Individual Time: 1300-1400 PT Individual Time Calculation (min): 60 min   Short Term Goals: Week 1:  PT Short Term Goal 1 (Week 1): Pt will roll with max assist +1 PT Short Term Goal 2 (Week 1): Pt will tolerate being up out of bed for 4 hours per day PT Short Term Goal 3 (Week 1): Pt will static sit with min assist   Skilled Therapeutic Interventions/Progress Updates:    Session focused on overall activity tolerance, upright tolerance OOB in w/c, sitting balance and functional mobility. Attempted to change out w/c to allow for BLE ELR to prevent accidental weigthbearing at 90 degree position but unavailable at this time. Modified this chair with pillows to allow for support but offweighting BLE and pt in semi reclined position. Focused on functional bed mobility to come to EOB from [facility] elevated position with pt attempting to activate musculature in BLE to assist with movement. Total assist +2 needed and no active trunk control. Required max to total assist to maintain sitting balance EOB. Total +2 for slideboard transfer to w/c as pt declined using maxi move due to pain in LLE during flexed position earlier today. Cues and facilitation for anterior weightshift at trunk. Pt brought into dayroom to see outside and for overall emotional/mental state.   O2 sats and HR remained WFL during session with PMSV donned and pt displays no discomfort.  Therapy Documentation Precautions:  Precautions Precautions: Fall, Cervical Required Braces or Orthoses: Cervical Brace Cervical Brace: Hard collar, At all times Restrictions Weight Bearing Restrictions: Yes RLE Weight Bearing: Non weight bearing LLE Weight Bearing: Non weight bearing General:   Vital Signs: Oxygen Therapy SpO2: >95 % O2 Device: Tracheostomy Collar O2 Flow Rate (L/min): 3 L/min FiO2  (%): 28 % Pain:  reports pain in LLE - premedicated per report.   See Function Navigator for Current Functional Status.   Therapy/Group: Individual Therapy  Canary Brim Ivory Broad, PT, DPT  05/23/2017, 2:12 PM

## 2017-05-23 NOTE — Progress Notes (Signed)
Spoke with RN.  States pt is receiving antibiotics currently.  RN toplace IV Team consult when ready for PICC line removal.

## 2017-05-23 NOTE — Progress Notes (Signed)
Occupational Therapy Session Note  Patient Details  Name: [name] MRN: [id number] Date of Birth: 04/21/1966  Today's Date: 05/23/2017 OT Individual Time: 1100-1115 OT Individual Time Calculation (min): 15 min  and Today's Date: 05/23/2017 OT Missed Time: 39 Minutes Missed Time Reason: Patient on bedrest (PICC line removal)   Short Term Goals: Week 1:  OT Short Term Goal 1 (Week 1): Pt will engage in static sitting for 3 minutes with  mod A or less for sitting balance.  OT Short Term Goal 2 (Week 1): Pt verbalize NWB precautions with 100% accuracy for 3 consecutive sessions.   Skilled Therapeutic Interventions/Progress Updates:    Pt resting in bed upon arrival.  Pt indicated generalized malaise.  Initial focus on bed mobility.  IV RN entered room to remove PICC line and pt missed remaining 45 mins therapy.   Therapy Documentation Precautions:  Precautions Precautions: Fall, Cervical Required Braces or Orthoses: Cervical Brace Cervical Brace: Hard collar, At all times Restrictions Weight Bearing Restrictions: Yes RLE Weight Bearing: Non weight bearing LLE Weight Bearing: Non weight bearing General: General OT Amount of Missed Time: 45 Minutes Pain:  Pt c/o generalized malaise; RN aware  See Function Navigator for Current Functional Status.   Therapy/Group: Individual Therapy  Leroy Libman 05/23/2017, 3:11 PM

## 2017-05-23 NOTE — Progress Notes (Signed)
Speech Language Pathology Daily Session Note  Patient Details  Name: [name] MRN: [id number] Date of Birth: 1966-03-11  Today's Date: 05/23/2017 SLP Individual Time: 1015-1030 SLP Individual Time Calculation (min): 15 min  Short Term Goals: Week 1: SLP Short Term Goal 1 (Week 1): Pt will utilize increased vocal intensity and slow rate to achieve intelligibility at the phrase level with mod assist verbal cues.   SLP Short Term Goal 2 (Week 1): Pt will recall basic, daily information with mod assist verbal cues for use of external aids.   SLP Short Term Goal 3 (Week 1): Pt will identify at least 2 new deficits and their impact on his functional independence with mod assist verbal cues.   SLP Short Term Goal 4 (Week 1): Pt will verbalize at least 2 safety precautions with mod assist verbal cues.    Skilled Therapeutic Interventions: Skilled treatment session focused on self-care education regarding results of Modified Barium Swallow Study. Education provided on recommendation for upgrade to full liquid diet with use of syringe. Pt needs further reinforcement. Nursing voiced understanding. Continue per current plan of care.      Function:  Eating Eating   Modified Consistency Diet: Yes Eating Assist Level: Helper feeds patient           Cognition Comprehension Comprehension assist level: Follows basic conversation/direction with extra time/assistive device  Expression   Expression assist level: Expresses basic 25 - 49% of the time/requires cueing 50 - 75% of the time. Uses single words/gestures.  Social Interaction Social Interaction assist level: Interacts appropriately 75 - 89% of the time - Needs redirection for appropriate language or to initiate interaction.  Problem Solving Problem solving assist level: Solves basic 25 - 49% of the time - needs direction more than half the time to initiate, plan or complete simple activities  Memory Memory assist level: Recognizes or  recalls 25 - 49% of the time/requires cueing 50 - 75% of the time    Pain    Therapy/Group: Individual Therapy  Prince Olivier 05/23/2017, 1:47 PM

## 2017-05-23 NOTE — Progress Notes (Signed)
[place] PHYSICAL MEDICINE & REHABILITATION     PROGRESS NOTE    Subjective/Complaints:   Objective: Vital Signs: Blood pressure 120/70, pulse 88, temperature 98 F (36.7 C), temperature source Axillary, resp. rate 18, weight 95.4 kg (210 lb 4.8 oz), SpO2 98 %. No results found.  Recent Labs  05/22/17 0452  WBC 10.4  HGB 9.4*  HCT 30.2*  PLT 651*    Recent Labs  05/22/17 0452  NA 137  K 4.0  CL 104  GLUCOSE 128*  BUN 23*  CREATININE 0.52*  CALCIUM 9.1   CBG (last 3)   Recent Labs  05/23/17 0011 05/23/17 0458 05/23/17 0804  GLUCAP 123* 115* 129*    Wt Readings from Last 3 Encounters:  05/23/17 95.4 kg (210 lb 4.8 oz)  05/21/17 99 kg (218 lb 3.2 oz)  01/11/17 91.6 kg (202 lb)    Physical Exam:  Constitutional: He appears well-developedand well-nourished. He is in no distress.  HENT:  Head: Normocephalicand atraumatic.  Jaws wired.  Eyes:  Unable to move right eye laterally. Left eye with extropion/ injected.  Neck:  Immobilized by Aspen collar. Keeps head turned to left and difficult to move head to midline.  Cardiovascular: RRR.   Respiratory: no distress. Some rhonchi GI: Soft. He exhibits distension. Bowel sounds are decreased. There is tenderness.  Genitourinary:  Genitourinary Comments: Foley in place. Incontinent of bowel.  Musculoskeletal: He exhibits edemaand pain, bruising left knee.  Min edema left thigh with surgical dressing on left hip. Left ankle splinted with ACE. CAM boot in place on right foot.  Neurological: He is easily aroused. He appears alert. Follows simple commands.  Motor 1-2/5 UE and LE's,   inconsistent, braces/splint in place. Does sense deep pain stim. Left III, VI nerve weakness. Skin: Skin is warmand dry.  Scattered abrasions along the left leg. Also with wounds along arms,chest.  Psychiatric:  Fairly pleasant and appropriate  Assessment/Plan: 1. Functional deficits, tetraplegia secondary to  TBI/cervical SCI which require 3+ hours per day of interdisciplinary therapy in a comprehensive inpatient rehab setting. Physiatrist is providing close team supervision and 24 hour management of active medical problems listed below. Physiatrist and rehab team continue to assess barriers to discharge/monitor patient progress toward functional and medical goals.  Function:  Bathing Bathing position      Bathing parts   Body parts bathed by helper: Left arm, Right arm, Chest, Abdomen, Front perineal area, Buttocks, Right upper leg, Left upper leg, Right lower leg, Left lower leg  Bathing assist Assist Level: 2 helpers      Upper Body Dressing/Undressing Upper body dressing                    Upper body assist Assist Level:  (total A)      Lower Body Dressing/Undressing Lower body dressing   What is the patient wearing?: [facility] Gown                              Lower body assist Assist for lower body dressing: 2 Helpers      Toileting Toileting Toileting activity did not occur: No continent bowel/bladder event        Toileting assist     Transfers Chair/bed transfer   Chair/bed transfer method: Other Chair/bed transfer assist level: 2 helpers Chair/bed transfer assistive device: Sliding board Mechanical lift: Maximove   Locomotion Ambulation Ambulation activity did not occur: Safety/medical concerns  Wheelchair Wheelchair activity did not occur: Safety/medical concerns Type: Manual      Cognition Comprehension Comprehension assist level: Follows basic conversation/direction with extra time/assistive device  Expression Expression assist level: Expresses basic 25 - 49% of the time/requires cueing 50 - 75% of the time. Uses single words/gestures.  Social Interaction Social Interaction assist level: Interacts appropriately 75 - 89% of the time - Needs redirection for appropriate language or to initiate interaction.  Problem Solving Problem  solving assist level: Solves basic 25 - 49% of the time - needs direction more than half the time to initiate, plan or complete simple activities  Memory Memory assist level: Recognizes or recalls 25 - 49% of the time/requires cueing 50 - 75% of the time   Medical Problem List and Plan: 1. Functional, cognitive and mobility deficitssecondary to Moderate TBI, C4 spinal cord injury -continue therapies 2. DVT Prophylaxis/Anticoagulation: Pharmaceutical: Lovenox 3. Chronic neck pain/Pain Management: On Gabapentin bid with hycet prn for pain  -left knee pain: ice. May need further work up for soft tissue injury 4. Mood: LCSW to follow for evaluation and support when appropriate.  5. Neuropsych: This patient is notcapable of making decisions on hisown behalf. 6. Skin/Wound Care: Air mattress overlay. Maintain adequate nutritional and hydration status.  7. Fluids/Electrolytes/Nutrition: Monitor I/O.   -continue TF  9. Left periorbital cellulitis/ CN III/VI palsy: 10Left femur fracture s/p IM nail:NWB X 8 weeks. 11. Left open tibial fracture s/p I and D with ORIF: NWB--splint to be changed next week with plans for CAM boot.  11. Left mandibular fracture s/p CR /tongue laceration: Downsized to CFS # 6 on 05/18/11. To leave wires in place for 4 weeks from 8/29? .  12. Chemosis left eye: Lacrilube every 3 hours till resolved per opthalmology.  13. Facial cellulitis: On Unasyn D # 8---transition to keflex thru tube 14. Leucocytosis: Monitor for signs of infection. 15. Thrombocytosis: Likely reactive. Dopplers today. Continue lovenox.  16. ABLA: hgb stable at 9.4  17. Abnormal LFTs: Question due to shocked liver.  83. H/o of MDD: Was on Trintellix 5 mg and Risperidone 5 mg bid at home--continue to hold for now.    LOS (Days) 2 A Roberts T, MD 05/23/2017 9:19 AM

## 2017-05-23 NOTE — Progress Notes (Signed)
Physical Therapy Session Note  Patient Details  Name: [name] MRN: [id number] Date of Birth: 09-05-1966  Today's Date: 05/23/2017 PT Individual Time: 0800-0905 and 1445-1545 PT Individual Time Calculation (min): 65 min and 60 min (total 125 min)   Short Term Goals: Week 1:  PT Short Term Goal 1 (Week 1): Pt will roll with max assist +1 PT Short Term Goal 2 (Week 1): Pt will tolerate being up out of bed for 4 hours per day PT Short Term Goal 3 (Week 1): Pt will static sit with min assist   Skilled Therapeutic Interventions/Progress Updates: Tx 1: Pt received semi-reclined in bed, reports significant pain in L knee and agreeable to treatment. Reports not sleeping well at night "because I hurt all over". Rolling totalA +2 to place maxi sling. Transferred bed>w/c with totalA +2 and maxi move. Significant pain in L knee during transfer in maxi, requiring therapist to support throughout, and elevated on pillow once in chair. Seated in w/c, pt performed washing face with hand-over-hand assist using LUE. Transported to gym [place]. Unable to obtain elevating leg rests for current w/c, may need to switch to a different tilt in space chair. Provided ice pack to L knee, upper traps; alerted next therapist to remove in 30 min when she arrived. Remained seated in w/c, semi-reclined and all needs in reach. Alerted RN to increased pain and fatigue this session.   Tx 2: Pt received seated in w/c, c/o pain 6/10 in L knee and neck, pre-medicated and agreeable to treatment. Transfer w/c <>mat table totalA +2 with transfer board. Dynamic sitting balance with minA overall with patient performing anterior/posterior and R/L lateral weight shifts reaching for therapist's hand as target; able to recover mild LOBs without assist, however occasional major LOBs mostly posteriorly and to L side requiring increased assist to recover. Lateral lean to B elbows for weight bearing x30 sec and return to midline with modA  for continued core activation/coordination. Returned to bed with totalA +2 with transfer board. Sit >supine totalA +2. Rolling totalA +2 to remove chuck pad and scoot toward HOB. Remained semi-reclined in bed at end of session, all needs in reach.      Therapy Documentation Precautions:  Precautions Precautions: Fall, Cervical Required Braces or Orthoses: Cervical Brace Cervical Brace: Hard collar, At all times Restrictions Weight Bearing Restrictions: Yes RLE Weight Bearing: Non weight bearing LLE Weight Bearing: Non weight bearing Vital Signs: Therapy Vitals Temp: 98 F (36.7 C) Temp Source: Axillary Pulse Rate: 88 Resp: 18 BP: 120/70 Patient Position (if appropriate): Lying Oxygen Therapy SpO2: 98 % O2 Device: Tracheostomy Collar O2 Flow Rate (L/min): 5 L/min FiO2 (%): 28 %   See Function Navigator for Current Functional Status.   Therapy/Group: Individual Therapy  [name] 05/23/2017, 7:33 AM

## 2017-05-23 NOTE — Progress Notes (Signed)
Modified Barium Swallow Progress Note  Patient Details  Name: [name] MRN: [id number] Date of Birth: [birth year]  Today's Date: 05/23/2017  Modified Barium Swallow completed.  Full report located under Chart Review in the Imaging Section.  Brief recommendations include the following:  Clinical Impression  Pt presents with moderate to severe oral phase dysphagia and mild pharyngeal dysphagia. Pt currently pt is wearing cervial collar, has jaw fixated and PMSV was in place during study. Pt's oral phase is c/b limited labial abilites and intermittent difficulty maintaining labial seal with leads to anterior spillage of all consistencies attempted. A syringe was used to place boluses on posterior buccal area. Pt unable to maintain labial seal for straw. Pt's demonstrates mild sensory deficits resulting in swallow initiation at the phyriform sinuses for nectar thick liquids, thin liquids and puree via spoon, cup and syringe trials. Since syringe is most effective way to present boluses, when 6 cc or greater of water was administered with mild sensed aspiration that pt was unable to clear with a cough. With volumes less of 5 cc or less, pt with no penetration or aspiration. Recommend allowing full liquid diet via syringe - 5 cc or less, PMSV in place, full nursing supervision, medicine via PEG and oral care before meals.    Swallow Evaluation Recommendations       SLP Diet Recommendations: Thin liquid   Liquid Administration via:  IT sales professional)   Medication Administration: Via alternative means   Supervision: Full assist for feeding;Full supervision/cueing for compensatory strategies   Compensations: Minimize environmental distractions;Slow rate;Small sips/bites   Postural Changes: Seated upright at 90 degrees   Oral Care Recommendations: Oral care QID        Honora Searson 05/23/2017,1:40 PM

## 2017-05-24 ENCOUNTER — Inpatient Hospital Stay (HOSPITAL_COMMUNITY): Payer: BC Managed Care – PPO | Admitting: Speech Pathology

## 2017-05-24 ENCOUNTER — Inpatient Hospital Stay (HOSPITAL_COMMUNITY): Payer: Self-pay | Admitting: *Deleted

## 2017-05-24 ENCOUNTER — Inpatient Hospital Stay (HOSPITAL_COMMUNITY): Payer: BC Managed Care – PPO

## 2017-05-24 ENCOUNTER — Inpatient Hospital Stay (HOSPITAL_COMMUNITY): Payer: Self-pay

## 2017-05-24 ENCOUNTER — Inpatient Hospital Stay (HOSPITAL_COMMUNITY): Payer: Self-pay | Admitting: Physical Therapy

## 2017-05-24 DIAGNOSIS — R609 Edema, unspecified: Secondary | ICD-10-CM

## 2017-05-24 LAB — GLUCOSE, CAPILLARY
GLUCOSE-CAPILLARY: 139 mg/dL — AB (ref 65–99)
GLUCOSE-CAPILLARY: 116 mg/dL — AB (ref 65–99)
GLUCOSE-CAPILLARY: 138 mg/dL — AB (ref 65–99)
Glucose-Capillary: 114 mg/dL — ABNORMAL HIGH (ref 65–99)
Glucose-Capillary: 119 mg/dL — ABNORMAL HIGH (ref 65–99)
Glucose-Capillary: 128 mg/dL — ABNORMAL HIGH (ref 65–99)

## 2017-05-24 MED ORDER — METHOCARBAMOL 500 MG PO TABS
500.0000 mg | ORAL_TABLET | Freq: Four times a day (QID) | ORAL | Status: DC
  Administered 2017-05-24 – 2017-06-17 (×95): 500 mg
  Filled 2017-05-24 (×94): qty 1

## 2017-05-24 NOTE — Progress Notes (Signed)
*  PRELIMINARY RESULTS* Vascular Ultrasound Bilateral lower extremity venous duplex has been completed.  Preliminary findings: No evidence of deep vein thrombosis in the visualized veins of the lower extremities.  Left calf veins not visualized due to bandage.  Negative for baker's cysts bilaterally.   [name] 05/24/2017, 2:44 PM

## 2017-05-24 NOTE — Care Management Note (Signed)
[facility] Individual Statement of Services  Patient Name:  [name]  Date:  05/24/2017  Welcome to the [facility].  Our goal is to provide you with an individualized program based on your diagnosis and situation, designed to meet your specific needs.  With this comprehensive rehabilitation program, you will be expected to participate in at least 3 hours of rehabilitation therapies Monday-Friday, with modified therapy programming on the weekends.  Your rehabilitation program will include the following services:  Physical Therapy (PT), Occupational Therapy (OT), Speech Therapy (ST), 24 hour per day rehabilitation nursing, Therapeutic Recreaction (TR), Neuropsychology, Case Management (Social Worker), Rehabilitation Medicine, Nutrition Services and Pharmacy Services  Weekly team conferences will be held on Tuesdays to discuss your progress.  Your Social Worker will talk with you frequently to get your input and to update you on team discussions.  Team conferences with you and your family in attendance may also be held.  Expected length of stay: 24-28 days  Overall anticipated outcome: max assistance  Depending on your progress and recovery, your program may change. Your Social Worker will coordinate services and will keep you informed of any changes. Your Social Worker's name and contact numbers are listed  below.  The following services may also be recommended but are not provided by the [facility] will be made to provide these services after discharge if needed.  Arrangements include referral to agencies that provide these services.  Your insurance has been verified to be:  Medicaid application pending Your primary doctor is:  Glendale Chard  Pertinent information will be  shared with your doctor and your insurance company.  Social Worker:  [place], [place] or ([phone]   Information discussed with and copy given to patient by: Lennart Pall, 05/24/2017, 4:06 PM
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
## 2017-05-24 NOTE — Progress Notes (Signed)
   Subjective:    Patient ID: [name], male    DOB: January 02, 1966, 51 y.o.   MRN: [id number]  HPI Progressing in inpatient rehab.  Lurline Idol remains in place.  Continues with double vision.  Able to move mandible slightly.  Review of Systems     Objective:   Physical Exam Alert, NAD Hard cervical collar. Cuffless trach in place, Passy-Muir valve. MMF wires in place, slight movement of mandible, good occlusion. Unable to move either eye laterally.  Left with limitation in all directions, somewhat improved, still with slight ptosis and conjunctival edema.     Assessment & Plan:  Tongue laceration s/p closure, left mandible angle fracture s/p MMF, respiratory failure s/p trach, left orbital cellulitis, bilateral CN VI palsy.  Minimal mandible movement should be fine.  Occlusion looks good.  Plan to leave wires in place for total of 4 weeks, been 2.5 weeks thus far.  Would recommend leaving trach tube in place until after wires are cut.  Eye findings continue to be of concern, particularly left orbital inflammatory findings.  Would recommend discussion with Dr. Valetta Close of ophthalmology regarding appropriate antibiotic therapy (was on Unasyn near time of discharge, now on Keflex only).
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
## 2017-05-24 NOTE — Progress Notes (Signed)
Physical Therapy Session Note  Patient Details  Name: [name] MRN: [id number] Date of Birth: 13-Oct-1965  Today's Date: 05/24/2017   Short Term Goals: Week 1:  PT Short Term Goal 1 (Week 1): Pt will roll with max assist +1 PT Short Term Goal 2 (Week 1): Pt will tolerate being up out of bed for 4 hours per day PT Short Term Goal 3 (Week 1): Pt will static sit with min assist   Skilled Therapeutic Interventions/Progress Updates: Pt received asleep in bed, awakes to stimulus however very lethargic, difficulty keeping eyes open and decreased vocalizations to therapist. Pt politely declines participation in session. RN alerted to significant lethargy and ongoing pain. Therapist followed up 45 min later and pt sleeping soundly. Will continue to follow POC per pt tolerance.      Therapy Documentation Precautions:  Precautions Precautions: Fall, Cervical Required Braces or Orthoses: Cervical Brace Cervical Brace: Hard collar, At all times Restrictions Weight Bearing Restrictions: Yes RLE Weight Bearing: Non weight bearing LLE Weight Bearing: Non weight bearing General: PT Amount of Missed Time (min): 90 Minutes PT Missed Treatment Reason: Patient fatigue   See Function Navigator for Current Functional Status.   Therapy/Group: Individual Therapy  [name] 05/24/2017, 7:25 AM

## 2017-05-24 NOTE — Progress Notes (Addendum)
Speech Language Pathology Daily Session Note  Patient Details  Name: Usman Millett MRN: [id number] Date of Birth: [birth year]  Today's Date: 05/24/2017   Session #1 SLP Individual Time: 1000-1030 SLP Individual Time Calculation (min): 30 min   Session #2 SLP Individual Time: 1515-1530 SLP Individual Time Calculation (min): 15 minutes   Short Term Goals: Week 1: SLP Short Term Goal 1 (Week 1): Pt will utilize increased vocal intensity and slow rate to achieve intelligibility at the phrase level with mod assist verbal cues.   SLP Short Term Goal 2 (Week 1): Pt will recall basic, daily information with mod assist verbal cues for use of external aids.   SLP Short Term Goal 3 (Week 1): Pt will identify at least 2 new deficits and their impact on his functional independence with mod assist verbal cues.   SLP Short Term Goal 4 (Week 1): Pt will verbalize at least 2 safety precautions with mod assist verbal cues.    Skilled Therapeutic Interventions:   Skilled treatment session #1  focused on dysphagia and speech goals. SLP placed pt's PMSV and pt tolerated for ~ 30 minutes without any decline in vitals. SLP provided skilled observation of pt consuming thin water via syringe without overt s/s of aspiration. With Mod A cues, pt able to increase speech intelligibility to ~ 50% at the phrase level. Pt was left in bed, bed alarm on and PMSV off. Continue per current plan of care.  Skilled treatment session #2 focused on education with wife on donning and duffing PMSV and when it is appropriate for pt to wear valve. Pt's wife able to return demonstrate and voiced understanding. Pt's wife able to provide supervision with PMSV.      Function:  Eating Eating   Modified Consistency Diet: No Eating Assist Level: Helper feeds patient           Cognition Comprehension Comprehension assist level: Follows basic conversation/direction with extra time/assistive device;Understands basic 90% of  the time/cues < 10% of the time  Expression   Expression assist level: Expresses basic 25 - 49% of the time/requires cueing 50 - 75% of the time. Uses single words/gestures.  Social Interaction Social Interaction assist level: Interacts appropriately 75 - 89% of the time - Needs redirection for appropriate language or to initiate interaction.  Problem Solving Problem solving assist level: Solves basic 25 - 49% of the time - needs direction more than half the time to initiate, plan or complete simple activities  Memory Memory assist level: Recognizes or recalls 25 - 49% of the time/requires cueing 50 - 75% of the time    Pain    Therapy/Group: Individual Therapy  [place] Nordquist 05/24/2017, 10:56 AM

## 2017-05-24 NOTE — Progress Notes (Signed)
Occupational Therapy Note  Patient Details  Name: [name] MRN: [id number] Date of Birth: 09/23/1965  Today's Date: 05/24/2017 OT Individual Time: 1300-1400 OT Individual Time Calculation (min): 60 min  Pt denies pain Individual therapy  Pt resting in bed upon arrival with family present.  Pt requested that bed pan be removed.  Pt required tot A + 2 for bed mobility.  Pt engaged initially engaged in bed mobility and BUE AAROM.  Pt with trance LUE biceps/triceps and grasp/extension.  Oral hygiene performed and pt consumed approx 25 cc liquid lunch (soup and water) with 5 cc syringe.  Pt remained in bed with family present and PMSV removed.    [name] [facility] 05/24/2017, 2:58 PM
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
## 2017-05-24 NOTE — Progress Notes (Signed)
Occupational Therapy Session Note  Patient Details  Name: [name] MRN: [id number] Date of Birth: Dec 11, 1965  Today's Date: 05/24/2017 OT Individual Time: 7829-5621 OT Individual Time Calculation (min): 60 min    Short Term Goals: Week 1:  OT Short Term Goal 1 (Week 1): Pt will engage in static sitting for 3 minutes with  mod A or less for sitting balance.  OT Short Term Goal 2 (Week 1): Pt verbalize NWB precautions with 100% accuracy for 3 consecutive sessions.   Skilled Therapeutic Interventions/Progress Updates:    Pt resting in bed upon arrival and c/o of L neck pain.  C-collar readjusted and tightened for improved positioning.  Pt initially engaged in bed mobility requiring tot A + 2.  RN entered for med administration and pt assisted to upright sitting in bed.  BUE PROM/AAROM.  Trace LUE biceps/triceps noted in addition to finger flexion/extension.  Pt pleased with improvement.  Pt remained in bed with all needs within reach.   Therapy Documentation Precautions:  Precautions Precautions: Fall, Cervical Required Braces or Orthoses: Cervical Brace Cervical Brace: Hard collar, At all times Restrictions Weight Bearing Restrictions: Yes RLE Weight Bearing: Non weight bearing LLE Weight Bearing: Non weight bearing   Pain: Pt c/o L neck pain (tightness noted); RN and MD aware, repositioned    See Function Navigator for Current Functional Status.   Therapy/Group: Individual Therapy  Leroy Libman 05/24/2017, 9:36 AM

## 2017-05-25 ENCOUNTER — Encounter (HOSPITAL_COMMUNITY): Payer: Self-pay | Admitting: Emergency Medicine

## 2017-05-25 ENCOUNTER — Inpatient Hospital Stay (HOSPITAL_COMMUNITY): Payer: Self-pay | Admitting: Physical Therapy

## 2017-05-25 ENCOUNTER — Inpatient Hospital Stay (HOSPITAL_COMMUNITY): Payer: BC Managed Care – PPO | Admitting: Occupational Therapy

## 2017-05-25 ENCOUNTER — Inpatient Hospital Stay (HOSPITAL_COMMUNITY): Payer: BC Managed Care – PPO | Admitting: Speech Pathology

## 2017-05-25 DIAGNOSIS — Z930 Tracheostomy status: Secondary | ICD-10-CM

## 2017-05-25 LAB — GLUCOSE, CAPILLARY
GLUCOSE-CAPILLARY: 116 mg/dL — AB (ref 65–99)
GLUCOSE-CAPILLARY: 122 mg/dL — AB (ref 65–99)
GLUCOSE-CAPILLARY: 126 mg/dL — AB (ref 65–99)
GLUCOSE-CAPILLARY: 139 mg/dL — AB (ref 65–99)
Glucose-Capillary: 149 mg/dL — ABNORMAL HIGH (ref 65–99)
Glucose-Capillary: 111 mg/dL — ABNORMAL HIGH (ref 65–99)

## 2017-05-25 NOTE — Progress Notes (Signed)
Speech Language Pathology Daily Session Note  Patient Details  Name: [name] MRN: [id number] Date of Birth: [birth year]  Today's Date: 05/25/2017 SLP Individual Time: 4098-1191 SLP Individual Time Calculation (min): 45 min  Short Term Goals: Week 1: SLP Short Term Goal 1 (Week 1): Pt will utilize increased vocal intensity and slow rate to achieve intelligibility at the phrase level with mod assist verbal cues.   SLP Short Term Goal 2 (Week 1): Pt will recall basic, daily information with mod assist verbal cues for use of external aids.   SLP Short Term Goal 3 (Week 1): Pt will identify at least 2 new deficits and their impact on his functional independence with mod assist verbal cues.   SLP Short Term Goal 4 (Week 1): Pt will verbalize at least 2 safety precautions with mod assist verbal cues.   SLP Short Term Goal 5 (Week 1): Pt will consume thin liquids via syringe no more than 5 cc without overt s/s of aspiration.   Skilled Therapeutic Interventions: Skilled treatment session focused on toleration of PMSV, speech intelligibility and dysphagia goals. Pt initially asleep with some difficulty maintain arousal. Pt with enough arousal to tolerate placement of PMSV. No decline in vitals noted with use. Pt able to achieve ~ 75% intelligibility at the phrase level for several short phrases. SLP further facilitated session by providing 1 trials of thin water after oral care via syringe. No overt s/s of aspiration. Pt with decreased arousal and no further trials given. Pt left upright in bed, PMSV off and pt asleep. Continue per current plan of care.      Function:  Eating Eating   Modified Consistency Diet: No Eating Assist Level: Helper feeds patient           Cognition Comprehension Comprehension assist level: Understands basic 75 - 89% of the time/ requires cueing 10 - 24% of the time  Expression   Expression assist level: Expresses basic 50 - 74% of the time/requires cueing  25 - 49% of the time. Needs to repeat parts of sentences.  Social Interaction Social Interaction assist level: Interacts appropriately 75 - 89% of the time - Needs redirection for appropriate language or to initiate interaction.  Problem Solving Problem solving assist level: Solves basic 25 - 49% of the time - needs direction more than half the time to initiate, plan or complete simple activities  Memory Memory assist level: Recognizes or recalls 25 - 49% of the time/requires cueing 50 - 75% of the time    Pain Pain Assessment Pain Score: 8   Therapy/Group: Individual Therapy  Lowell Mcgurk 05/25/2017, 8:49 AM

## 2017-05-25 NOTE — Progress Notes (Signed)
[facility] PHYSICAL MEDICINE & REHABILITATION     PROGRESS NOTE    Subjective/Complaints: No issues overnight  ROS: pt denies nausea, vomiting, diarrhea, cough, shortness of breath or chest pain   Objective: Vital Signs: Blood pressure 127/84, pulse 96, temperature 97.8 F (36.6 C), temperature source Axillary, resp. rate 20, weight 90 kg (198 lb 6.6 oz), SpO2 100 %. No results found. No results for input(s): WBC, HGB, HCT, PLT in the last 72 hours. No results for input(s): NA, K, CL, GLUCOSE, BUN, CREATININE, CALCIUM in the last 72 hours.  Invalid input(s): CO CBG (last 3)   Recent Labs  05/25/17 0347 05/25/17 0808 05/25/17 1219  GLUCAP 149* 116* 126*    Wt Readings from Last 3 Encounters:  05/25/17 90 kg (198 lb 6.6 oz)  05/21/17 99 kg (218 lb 3.2 oz)  01/11/17 91.6 kg (202 lb)    Physical Exam:  Constitutional: He appears well-developedand well-nourished. He is in no distress.  HENT:  Head: Normocephalicand atraumatic.  Jaws wired.  Eyes:  Unable to move right eye laterally. Left eye injected.  Neck:  Immobilized by Aspen collar. Keeps head/leaning turned to left but can be reduced to neutral Cardiovascular: RRR without murmur. No JVD    Respiratory: scattered rhonchi GI: Soft. He exhibits distension. Bowel sounds are present. PEG site normal.  There is tenderness.  Genitourinary:  Genitourinary Comments: Foley in place.   Musculoskeletal: He exhibits continued edemaand pain, bruising left knee.  Min edema left thigh with surgical dressing on left hip. Left ankle splinted with ACE. CAM boot in place on right foot.  Neurological: He is easily aroused. He appears alert. Follows simple commands.  Motor 1-2/5 UE and LE's,   inconsistent, braces/splint in place. Does sense deep pain stim. Left III, VI nerve weakness.Oriented to person and place  Skin: Skin is warmand dry.  Scattered abrasions along the left leg. Also with wounds along arms,chest.   Psychiatric:  Fairly pleasant and appropriate  Assessment/Plan: 1. Functional deficits, tetraplegia secondary to TBI/cervical SCI which require 3+ hours per day of interdisciplinary therapy in a comprehensive inpatient rehab setting. Physiatrist is providing close team supervision and 24 hour management of active medical problems listed below. Physiatrist and rehab team continue to assess barriers to discharge/monitor patient progress toward functional and medical goals.  Function:  Bathing Bathing position      Bathing parts   Body parts bathed by helper: Left arm, Right arm, Chest, Abdomen, Front perineal area, Buttocks, Right upper leg, Left upper leg, Right lower leg, Left lower leg  Bathing assist Assist Level: 2 helpers      Upper Body Dressing/Undressing Upper body dressing                    Upper body assist Assist Level:  (total A)      Lower Body Dressing/Undressing Lower body dressing   What is the patient wearing?: Hospital Gown                              Lower body assist Assist for lower body dressing: 2 Helpers      Toileting Toileting Toileting activity did not occur: No continent bowel/bladder event   Toileting steps completed by helper: Adjust clothing prior to toileting, Performs perineal hygiene, Adjust clothing after toileting (per [place], [place] report)    Toileting assist Assist level: Two helpers (per [place], [place] report)   Transfers Chair/bed transfer  Chair/bed transfer method: Lateral scoot Chair/bed transfer assist level: 2 helpers Chair/bed transfer assistive device: Orthosis, Sliding board Mechanical lift: Maximove   Locomotion Ambulation Ambulation activity did not occur: Safety/medical Editor, commissioning activity did not occur: Safety/medical concerns Type: Manual   Assist Level: Dependent (Pt equals 0%)  Cognition Comprehension Comprehension assist level: Understands basic 75 - 89% of  the time/ requires cueing 10 - 24% of the time  Expression Expression assist level: Expresses basic 50 - 74% of the time/requires cueing 25 - 49% of the time. Needs to repeat parts of sentences.  Social Interaction Social Interaction assist level: Interacts appropriately 75 - 89% of the time - Needs redirection for appropriate language or to initiate interaction.  Problem Solving Problem solving assist level: Solves basic 25 - 49% of the time - needs direction more than half the time to initiate, plan or complete simple activities  Memory Memory assist level: Recognizes or recalls 25 - 49% of the time/requires cueing 50 - 75% of the time   Medical Problem List and Plan: 1. Functional, cognitive and mobility deficitssecondary to Moderate TBI, C4 spinal cord injury -CIR PT, OT, speech 2. DVT Prophylaxis/Anticoagulation: Pharmaceutical: Lovenox 3. Chronic neck pain/Pain Management: On Gabapentin bid with hycet prn for pain  -left knee pain, likely soft tissue, ligamentous. Wrap/support as able   -work up further as outpt  -scheduled robaxin for cervicalgia/dystonia, make sure collar is fitted appropriately 4. Mood: LCSW to follow for evaluation and support when appropriate.  5. Neuropsych: This patient is notcapable of making decisions on hisown behalf. 6. Skin/Wound Care: Air mattress overlay. Maintain adequate nutritional and hydration status.  7. Fluids/Electrolytes/Nutrition: Monitor I/O.   -continue TF  -follow up labs Monday  9. Left periorbital cellulitis/ CN III/VI palsy: 10Left femur fracture s/p IM nail:NWB X 8 weeks. 11. Left open tibial fracture s/p I and D with ORIF: NWB--convert to CAM boot? Follow up with ortho.  11. Left mandibular fracture s/p CR /tongue laceration: Downsized to CFS # 6 on 05/18/11.  -downsize next week  -To leave wires in place for 4 weeks from 8/29? .  12. Chemosis left eye: Continue Lacrilube every 3 hours till resolved per opthalmology.   13. Facial cellulitis: Keflex abx day 9 of 14 14. Leucocytosis: Monitor for signs of infection. 15. Thrombocytosis: Likely reactive. Dopplers pending. Continue lovenox.  16. ABLA: hgb stable at 9.4  17. Abnormal LFTs: Question due to shocked liver.  67. H/o of MDD: Was on Trintellix 5 mg and Risperidone 5 mg bid at home--continue to hold for now.    LOS (Days) 4 A FACE TO FACE EVALUATION WAS PERFORMED  Charlett Blake, MD 05/25/2017 2:25 PM

## 2017-05-25 NOTE — Progress Notes (Signed)
Occupational Therapy Session Note  Patient Details  Name: [name] MRN: [id number] Date of Birth: 06-18-1966  Today's Date: 05/25/2017 OT Individual Time: 3295-1884 and 1130-1200 OT Individual Time Calculation (min): 60 min and 30 min   Short Term Goals: Week 1:  OT Short Term Goal 1 (Week 1): Pt will engage in static sitting for 3 minutes with  mod A or less for sitting balance.  OT Short Term Goal 2 (Week 1): Pt verbalize NWB precautions with 100% accuracy for 3 consecutive sessions.   Skilled Therapeutic Interventions/Progress Updates:    Visit 1:  Mild c/o pain, had just been provided with pain meds. PMV in place and pt quite talkative. Reviewed precautions. PROM to BUE while providing pt with a sponge bath.  +2 A to move from supine to sit with total A.  Sat EOB for 4-5 minutes with mod A initially and then held static sit for 1 min with steadying A.  Pt transferred to w/c with slide board with total A of 2.  Positioned with several pillows below calves to elevate legs on feet rest. Chair reclined back.  RN in room with pt.   Visit 2: pt c/o pain in LLE from sitting up in chair.  Pt's father present. +2 Total A to use board back to bed and to adjust pt into bed with pillows supporting all 4 limbs. He stated that he was no longer in pain.  PROM of shoulders with good scapular glide. Pt had trace L scapular elevation.  Active A facilitation of L tricep as pt could extend elbow through partial range.  Pt was becoming sleepy at this point. PMV removed. Pt resting in bed with family in room.    Therapy Documentation Precautions:  Precautions Precautions: Fall, Cervical Required Braces or Orthoses: Cervical Brace Cervical Brace: Hard collar, At all times Restrictions Weight Bearing Restrictions: Yes RLE Weight Bearing: Non weight bearing LLE Weight Bearing: Non weight bearing    Vital Signs: Therapy Vitals Pulse Rate: 96 Resp: 20 Patient Position (if appropriate):  Lying Oxygen Therapy SpO2: 100 % O2 Device: Tracheostomy Collar O2 Flow Rate (L/min): 5 L/min FiO2 (%): 28 % Pain:   ADL:   Vision   Perception    Praxis   Exercises:   Other Treatments:    See Function Navigator for Current Functional Status.   Therapy/Group: Individual Therapy  [place] 05/25/2017, 1:02 PM

## 2017-05-25 NOTE — Progress Notes (Signed)
Physical Therapy Session Note  Patient Details  Name: [name] MRN: [id number] Date of Birth: 07/21/1966  Today's Date: 05/25/2017 PT Individual Time: 1355-1440 PT Individual Time Calculation (min): 45 min   Short Term Goals: Week 1:  PT Short Term Goal 1 (Week 1): Pt will roll with max assist +1 PT Short Term Goal 2 (Week 1): Pt will tolerate being up out of bed for 4 hours per day PT Short Term Goal 3 (Week 1): Pt will static sit with min assist   Skilled Therapeutic Interventions/Progress Updates:   Pt supine in bed upon arrival and agreeable to therapy, c/o LE pain L>R, RN aware and pt's wife reports he just took medicine for pain. Pt reported spending >2 hours in w/c and requesting to perform therapy in bed this session. PT performed PROM of both extremities into hip and knee flexion, knee extension, hip IR/ER, and hip abduction. Rhythmic initiation of all motions in 5-10 reps. R/LLE AAROM for knee and hip flexion, resisted hip extension, and hip abduction, 5-10 reps each. Pt performed hip IR/ER AROM 2x10 w/ each leg. All motions within shortened, tolerable ROM. No increase in LE pain during motions. LEs repositioned and PT performed AAROM of bilateral shoulder ER/IR, elbow flexion/extension, and hand grip. Very trace muscle activation for all active assisted motions, but palpable. Pt falling asleep towards end of session and unable to stay awake, ended session early. Ended supine in bed and in care of wife w/ oxygen on and PMSV on as well. Pt's wife is signed off on safety plan to provide PMSV supervision. All needs met.   Therapy Documentation Precautions:  Precautions Precautions: Fall, Cervical Required Braces or Orthoses: Cervical Brace Cervical Brace: Hard collar, At all times Restrictions Weight Bearing Restrictions: Yes RLE Weight Bearing: Non weight bearing LLE Weight Bearing: Non weight bearing General: PT Amount of Missed Time (min): 15 Minutes PT Missed  Treatment Reason: Patient fatigue Vital Signs: Therapy Vitals Pulse Rate: 96 Resp: 20 Patient Position (if appropriate): Lying Oxygen Therapy SpO2: 100 % O2 Device: Tracheostomy Collar O2 Flow Rate (L/min): 5 L/min FiO2 (%): 28 %  See Function Navigator for Current Functional Status.   Therapy/Group: Individual Therapy  [place] Teegarden K Arnette 05/25/2017, 2:41 PM

## 2017-05-26 ENCOUNTER — Inpatient Hospital Stay (HOSPITAL_COMMUNITY): Payer: Self-pay

## 2017-05-26 LAB — GLUCOSE, CAPILLARY
GLUCOSE-CAPILLARY: 127 mg/dL — AB (ref 65–99)
GLUCOSE-CAPILLARY: 127 mg/dL — AB (ref 65–99)
GLUCOSE-CAPILLARY: 143 mg/dL — AB (ref 65–99)
Glucose-Capillary: 120 mg/dL — ABNORMAL HIGH (ref 65–99)
Glucose-Capillary: 138 mg/dL — ABNORMAL HIGH (ref 65–99)
Glucose-Capillary: 145 mg/dL — ABNORMAL HIGH (ref 65–99)

## 2017-05-26 NOTE — Progress Notes (Signed)
Occupational Therapy Session Note  Patient Details  Name: [name] MRN: [id number] Date of Birth: 1966-07-30  Today's Date: 05/26/2017 OT Individual Time: 1300-1340 OT Individual Time Calculation (min): 40 min    Short Term Goals: Week 1:  OT Short Term Goal 1 (Week 1): Pt will engage in static sitting for 3 minutes with  mod A or less for sitting balance.  OT Short Term Goal 2 (Week 1): Pt verbalize NWB precautions with 100% accuracy for 3 consecutive sessions.   Skilled Therapeutic Interventions/Progress Updates:    Pt resting in bed upon arrival with numerous family members present.  Pt c/o ongoing B shoulder/upper trap pain and Kinesio Tape applied.  Pt c/o increase HA when HOB elevated.  HOB maintained at 45 degrees during therapy.  Pt engaged in BUE active assist facilitation of biceps and triceps.  Pt demonstrates greater initiation and active movement in LUE.  Pt demonstrates active finger extension and weak grasp (L>R). PROM performed on BUE at shoulder.  Pt remained in bed with family present.   Therapy Documentation Precautions:  Precautions Precautions: Fall, Cervical Required Braces or Orthoses: Cervical Brace Cervical Brace: Hard collar, At all times Restrictions Weight Bearing Restrictions: Yes RLE Weight Bearing: Non weight bearing LLE Weight Bearing: Non weight bearing Pain:  Pt c/o B shoulder/upper trap pain (Kinesio Tape applied and repositioned; pt also c/o HA; RN aware and meds admin prior to therapy   See Function Navigator for Current Functional Status.   Therapy/Group: Individual Therapy  Leroy Libman 05/26/2017, 1:43 PM

## 2017-05-26 NOTE — Progress Notes (Signed)
[facility] PHYSICAL MEDICINE & REHABILITATION     PROGRESS NOTE    Subjective/Complaints: Pt resting , trach uncapped with trach collar  ROS: pt denies nausea, vomiting, diarrhea, cough, shortness of breath or chest pain   Objective: Vital Signs: Blood pressure 122/79, pulse 100, temperature 99.6 F (37.6 C), temperature source Axillary, resp. rate 20, weight 90.5 kg (199 lb 8.8 oz), SpO2 100 %. No results found. No results for input(s): WBC, HGB, HCT, PLT in the last 72 hours. No results for input(s): NA, K, CL, GLUCOSE, BUN, CREATININE, CALCIUM in the last 72 hours.  Invalid input(s): CO CBG (last 3)   Recent Labs  05/26/17 0340 05/26/17 0805 05/26/17 1149  GLUCAP 120* 127* 143*    Wt Readings from Last 3 Encounters:  05/26/17 90.5 kg (199 lb 8.8 oz)  05/21/17 99 kg (218 lb 3.2 oz)  01/11/17 91.6 kg (202 lb)    Physical Exam:  Constitutional: He appears well-developedand well-nourished. He is in no distress.  HENT:  Head: Normocephalicand atraumatic.  Jaws wired.  Eyes:  Unable to move right eye laterally. Left eye injected.  Neck:  Immobilized by Aspen collar. Keeps head/leaning turned to left but can be reduced to neutral Cardiovascular: RRR without murmur. No JVD    Respiratory: scattered rhonchi GI: Soft. He exhibits distension. Bowel sounds are present. PEG site normal.  There is tenderness.  Genitourinary:  Genitourinary Comments: Foley in place.   Musculoskeletal: He exhibits continued edemaand pain, bruising left knee.  Min edema left thigh with surgical dressing on left hip. Left ankle splinted with ACE. CAM boot in place on right foot.  Neurological: He is easily aroused. He appears alert. Follows simple commands.  Motor 1-2/5 UE and LE's,   inconsistent, braces/splint in place. Does sense deep pain stim. Left III, VI nerve weakness.Oriented to person and place  Skin: Skin is warmand dry.  Scattered abrasions along the left leg. Also with  wounds along arms,chest.  Psychiatric:  Fairly pleasant and appropriate  Assessment/Plan: 1. Functional deficits, tetraplegia secondary to TBI/cervical SCI which require 3+ hours per day of interdisciplinary therapy in a comprehensive inpatient rehab setting. Physiatrist is providing close team supervision and 24 hour management of active medical problems listed below. Physiatrist and rehab team continue to assess barriers to discharge/monitor patient progress toward functional and medical goals.  Function:  Bathing Bathing position      Bathing parts   Body parts bathed by helper: Left arm, Right arm, Chest, Abdomen, Front perineal area, Buttocks, Right upper leg, Left upper leg, Right lower leg, Left lower leg  Bathing assist Assist Level: 2 helpers      Upper Body Dressing/Undressing Upper body dressing                    Upper body assist Assist Level:  (total A)      Lower Body Dressing/Undressing Lower body dressing   What is the patient wearing?: Hospital Gown                              Lower body assist Assist for lower body dressing: 2 Helpers      Toileting Toileting Toileting activity did not occur: No continent bowel/bladder event   Toileting steps completed by helper: Adjust clothing prior to toileting, Performs perineal hygiene, Adjust clothing after toileting (per [place], [place] report)    Toileting assist Assist level: Two helpers (per [place], [place] report)  Transfers Chair/bed transfer   Chair/bed transfer method: Lateral scoot Chair/bed transfer assist level: 2 helpers Chair/bed transfer assistive device: Orthosis, Sliding board Mechanical lift: Maximove   Locomotion Ambulation Ambulation activity did not occur: Safety/medical Editor, commissioning activity did not occur: Safety/medical concerns Type: Manual   Assist Level: Dependent (Pt equals 0%)  Cognition Comprehension Comprehension assist level:  Follows basic conversation/direction with no assist  Expression Expression assist level: Expresses basic needs/ideas: With no assist  Social Interaction Social Interaction assist level: Interacts appropriately 50 - 74% of the time - May be physically or verbally inappropriate.  Problem Solving Problem solving assist level: Solves basic 25 - 49% of the time - needs direction more than half the time to initiate, plan or complete simple activities  Memory Memory assist level: Recognizes or recalls 25 - 49% of the time/requires cueing 50 - 75% of the time   Medical Problem List and Plan: 1. Functional, cognitive and mobility deficitssecondary to Moderate TBI, C4 spinal cord injury -CIR PT, OT, speech 2. DVT Prophylaxis/Anticoagulation: Pharmaceutical: Lovenox 3. Chronic neck pain/Pain Management: On Gabapentin bid with hycet prn for pain, Asking for pain medication  -left knee pain, likely soft tissue, ligamentous. Wrap/support as able   -work up further as outpt  -scheduled robaxin for cervicalgia/dystonia, make sure collar is fitted appropriately 4. Mood: LCSW to follow for evaluation and support when appropriate.  5. Neuropsych: This patient is notcapable of making decisions on hisown behalf. 6. Skin/Wound Care: Air mattress overlay. Maintain adequate nutritional and hydration status.  7. Fluids/Electrolytes/Nutrition: Monitor I/O.   -continue TF  -follow up labs Monday  9. Left periorbital cellulitis/ CN III/VI palsy: 10Left femur fracture s/p IM nail:NWB X 8 weeks. 11. Left open tibial fracture s/p I and D with ORIF: NWB--convert to CAM boot? Follow up with ortho.  11. Left mandibular fracture s/p CR /tongue laceration: Downsized to CFS # 6 on 05/18/11.  -downsize next week  -To leave wires in place for 4 weeks from 8/29? .  12. Chemosis left eye: Continue Lacrilube every 3 hours till resolved per opthalmology.  13. Facial cellulitis: Keflex abx day 10 of 14 14.  Leucocytosis: Monitor for signs of infection. 15. Thrombocytosis: Likely reactive. Dopplers pending. Continue lovenox.  16. ABLA: hgb stable at 9.4  17. Abnormal LFTs: Question due to shocked liver.  59. H/o of MDD: Was on Trintellix 5 mg and Risperidone 5 mg bid at home--continue to hold for now.    LOS (Days) 5 A FACE TO FACE EVALUATION WAS PERFORMED  Charlett Blake, MD 05/26/2017 12:59 PM

## 2017-05-27 ENCOUNTER — Inpatient Hospital Stay (HOSPITAL_COMMUNITY): Payer: BC Managed Care – PPO

## 2017-05-27 ENCOUNTER — Ambulatory Visit (HOSPITAL_COMMUNITY): Payer: Self-pay | Admitting: Physical Therapy

## 2017-05-27 ENCOUNTER — Inpatient Hospital Stay (HOSPITAL_COMMUNITY): Payer: BC Managed Care – PPO | Admitting: Speech Pathology

## 2017-05-27 LAB — BASIC METABOLIC PANEL
Anion gap: 6 (ref 5–15)
BUN: 19 mg/dL (ref 6–20)
CHLORIDE: 98 mmol/L — AB (ref 101–111)
CO2: 29 mmol/L (ref 22–32)
Calcium: 9.4 mg/dL (ref 8.9–10.3)
Creatinine, Ser: 0.43 mg/dL — ABNORMAL LOW (ref 0.61–1.24)
GFR calc non Af Amer: >60 mL/min (ref >60)
Glucose, Bld: 112 mg/dL — ABNORMAL HIGH (ref 65–99)
Potassium: 4 mmol/L (ref 3.5–5.1)
SODIUM: 133 mmol/L — AB (ref 135–145)

## 2017-05-27 LAB — GLUCOSE, CAPILLARY
GLUCOSE-CAPILLARY: 121 mg/dL — AB (ref 65–99)
GLUCOSE-CAPILLARY: 108 mg/dL — AB (ref 65–99)
GLUCOSE-CAPILLARY: 119 mg/dL — AB (ref 65–99)
Glucose-Capillary: 122 mg/dL — ABNORMAL HIGH (ref 65–99)
Glucose-Capillary: 98 mg/dL (ref 65–99)

## 2017-05-27 LAB — CBC
HEMATOCRIT: 33.1 % — AB (ref 39.0–52.0)
HEMOGLOBIN: 10.5 g/dL — AB (ref 13.0–17.0)
MCH: 27.3 pg (ref 26.0–34.0)
MCHC: 31.7 g/dL (ref 30.0–36.0)
MCV: 86.2 fL (ref 78.0–100.0)
Platelets: 477 10*3/uL — ABNORMAL HIGH (ref 150–400)
RBC: 3.84 MIL/uL — ABNORMAL LOW (ref 4.22–5.81)
RDW: 16 % — ABNORMAL HIGH (ref 11.5–15.5)
WBC: 7.9 10*3/uL (ref 4.0–10.5)

## 2017-05-27 NOTE — Progress Notes (Signed)
[place] PHYSICAL MEDICINE & REHABILITATION     PROGRESS NOTE    Subjective/Complaints: Up with therapy. PMV. Pain under control. No major issues over weekend  ROS: Limited due to cognitive/behavioral   Objective: Vital Signs: Blood pressure 121/78, pulse 86, temperature 99 F (37.2 C), temperature source Axillary, resp. rate 16, weight 90.2 kg (198 lb 14.2 oz), SpO2 98 %. No results found. No results for input(s): WBC, HGB, HCT, PLT in the last 72 hours. No results for input(s): NA, K, CL, GLUCOSE, BUN, CREATININE, CALCIUM in the last 72 hours.  Invalid input(s): CO CBG (last 3)   Recent Labs  05/26/17 2343 05/27/17 0358 05/27/17 0816  GLUCAP 127* 121* 122*    Wt Readings from Last 3 Encounters:  05/27/17 90.2 kg (198 lb 14.2 oz)  05/21/17 99 kg (218 lb 3.2 oz)  01/11/17 91.6 kg (202 lb)    Physical Exam:  Constitutional: He appears well-developedand well-nourished. He is in no distress.  HENT:  Head: Normocephalicand atraumatic.  Jaws wired. phonation better Eyes:  Unable to move right eye laterally. Left eye injected and limited to lateral fields---able to see through left eye Neck:  Immobilized by Aspen collar.head more neutral.  Cardiovascular:. RRR without murmur. No JVD     Respiratory: normal effort.  GI: Soft. He exhibits distension. Bowel sounds are present. PEG site normal.  There is tenderness.  Genitourinary:  Genitourinary Comments: Foley in place.   Musculoskeletal: He exhibits continued edemaand pain, bruising left knee.  Min edema left thigh with surgical dressing on left hip. Left ankle splinted with ACE. CAM boot in place on right foot.  Neurological: He is easily aroused. He appears alert. Follows simple commands.  Motor 1-2/5 UE and LE's,   inconsistent, braces/splint in place. Does sense deep pain stim. Left III, VI nerve weakness but seems to have some movement. Right vi deficit. .Oriented to person and place  Skin: Skin is  warmand dry.  Scattered wounds/dry Psychiatric:  Fairly pleasant and cooperative  Assessment/Plan: 1. Functional deficits, tetraplegia secondary to TBI/cervical SCI which require 3+ hours per day of interdisciplinary therapy in a comprehensive inpatient rehab setting. Physiatrist is providing close team supervision and 24 hour management of active medical problems listed below. Physiatrist and rehab team continue to assess barriers to discharge/monitor patient progress toward functional and medical goals.  Function:  Bathing Bathing position      Bathing parts   Body parts bathed by helper: Left arm, Right arm, Chest, Abdomen, Front perineal area, Buttocks, Right upper leg, Left upper leg, Right lower leg, Left lower leg  Bathing assist Assist Level: 2 helpers      Upper Body Dressing/Undressing Upper body dressing                    Upper body assist Assist Level:  (total A)      Lower Body Dressing/Undressing Lower body dressing   What is the patient wearing?: Hospital Gown                              Lower body assist Assist for lower body dressing: 2 Helpers      Toileting Toileting Toileting activity did not occur: No continent bowel/bladder event   Toileting steps completed by helper: Adjust clothing prior to toileting, Performs perineal hygiene, Adjust clothing after toileting (per [place], NT report)    Toileting assist Assist level: Two helpers (per [place], [place] report)  Transfers Chair/bed transfer   Chair/bed transfer method: Lateral scoot Chair/bed transfer assist level: 2 helpers Chair/bed transfer assistive device: Orthosis, Sliding board Mechanical lift: Maximove   Locomotion Ambulation Ambulation activity did not occur: Safety/medical Editor, commissioning activity did not occur: Safety/medical concerns Type: Manual   Assist Level: Dependent (Pt equals 0%)  Cognition Comprehension Comprehension assist  level: Understands basic 50 - 74% of the time/ requires cueing 25 - 49% of the time  Expression Expression assist level: Expresses basic 50 - 74% of the time/requires cueing 25 - 49% of the time. Needs to repeat parts of sentences.  Social Interaction Social Interaction assist level: Interacts appropriately 50 - 74% of the time - May be physically or verbally inappropriate.  Problem Solving Problem solving assist level: Solves basic 50 - 74% of the time/requires cueing 25 - 49% of the time  Memory Memory assist level: Recognizes or recalls 50 - 74% of the time/requires cueing 25 - 49% of the time   Medical Problem List and Plan: 1. Functional, cognitive and mobility deficitssecondary to Moderate TBI, C4 spinal cord injury -CIR PT, OT, speech 2. DVT Prophylaxis/Anticoagulation: Pharmaceutical: Lovenox 3. Chronic neck pain/Pain Management: On Gabapentin bid with hycet prn for pain, Asking for pain medication  -left knee pain, likely soft tissue, ligamentous. Wrap/support as able   -work up further as outpt  -scheduled robaxin for cervicalgia/dystonia, cervical rom/collar 4. Mood: LCSW to follow for evaluation and support when appropriate.  5. Neuropsych: This patient is notcapable of making decisions on hisown behalf. 6. Skin/Wound Care: Air mattress overlay. Maintain adequate nutritional and hydration status.  7. Fluids/Electrolytes/Nutrition: Monitor I/O.   -continue TF  -follow up labs Monday  9. Left periorbital cellulitis/ CN III/VI palsy: 10Left femur fracture s/p IM nail:NWB X 8 weeks. 11. Left open tibial fracture s/p I and D with ORIF: NWB--convert to CAM boot? Follow up with ortho.  11. Left mandibular fracture s/p CR /tongue laceration: Downsized to CFS # 6 on 05/18/11.  -downsize to #4 today  -To leave wires in place for 4 weeks from 8/29? .  12. Chemosis left eye: Continue Lacrilube every 3 hours till resolved per opthalmology.  13. Facial cellulitis: Keflex  abx day 12 of 14 14. Leucocytosis: Monitor for signs of infection. 15. Thrombocytosis: Likely reactive. Dopplers pending. Continue lovenox.  16. ABLA: hgb stable at 9.4  17. Abnormal LFTs: Question due to shocked liver.  67. H/o of MDD: Was on Trintellix 5 mg and Risperidone 5 mg bid at home--continue to hold for now.   -behavior reasonable at present   LOS (Days) 6 A FACE TO FACE EVALUATION WAS PERFORMED  Meredith Staggers, MD 05/27/2017 9:19 AM

## 2017-05-27 NOTE — Procedures (Signed)
Tracheostomy Change Note  Patient Details:   Name: [name] DOB: March 29, 1966 MRN: [id number]    Airway Documentation:       Evaluation  O2 sats: stable throughout Complications: No apparent complications Patient did tolerate procedure well. Bilateral Breath Sounds: Clear, Diminished   Trach downsized per MD order.  Pt tolerated well RT will continue to monitor.     Carson Myrtle 05/27/2017, 4:39 PM

## 2017-05-27 NOTE — Progress Notes (Signed)
Occupational Therapy Session Note  Patient Details  Name: [name] MRN: [id number] Date of Birth: 05/08/1966  Today's Date: 05/27/2017 OT Co-Treatment Time: 1115-1200 (total time 1030-1200) OT Co-Treatment Time Calculation (min): 45 min   Short Term Goals: Week 1:  OT Short Term Goal 1 (Week 1): Pt will engage in static sitting for 3 minutes with  mod A or less for sitting balance.  OT Short Term Goal 2 (Week 1): Pt verbalize NWB precautions with 100% accuracy for 3 consecutive sessions.   Skilled Therapeutic Interventions/Progress Updates:    Co-tx with PT.  Focus on bed mobility, slide board transfers, and sitting balance.  Pt requires tot A +2 for bed mobility and slide board transfers.  Pt educated on techniques for supine>sit EOB and slide board transfers.  Pt required tot A +2 for lateral leans for placement of pad and slide board.  Sitting EOM, pt able to initiate and partially perform lateral leans to R and L in addition to anterior and posterior leans.  Pt initiates elbow flexion of LUE while seated in addition to finger flexion/extension.  Pt c/o chest pain (sternal); all vitals WNL.  C-collar checked for fit.  Pt returned to room and remained in w/c with all needs within reach.  RN notified of pt's c/o pain and that pt remained in w/c but is unable to activate soft call bell.  Therapy Documentation Precautions:  Precautions Precautions: Fall, Cervical Required Braces or Orthoses: Cervical Brace Cervical Brace: Hard collar, At all times Restrictions Weight Bearing Restrictions: Yes RLE Weight Bearing: Non weight bearing LLE Weight Bearing: Non weight bearing Pain:  Pt c/o discomfort in sternal area; RN aware and repositioned  See Function Navigator for Current Functional Status.   Therapy/Group: Co-Treatment  Leroy Libman 05/27/2017, 1:44 PM

## 2017-05-27 NOTE — Op Note (Addendum)
PREOP DIAGNOSIS:  1. Atlanto-axial instability 2. Atlanto-occipital instability   POSTOP DIAGNOSIS: Same  PROCEDURE: 1. Posterior segmental instrumentation, Occiput - C3 (occipital screws, C1 lateral mass screws, C2 pedicle screws, C3 lateral mass screws) - Medtronic vertex 2. Posterolateral fusion, occiput - C3 3. Use of morcellized bone allograft - Medtronic Magnifuse 4. Use of BMP   SURGEON: Dr. Consuella Lose, MD  ASSISTANT: Dr. Newman Pies, MD  ANESTHESIA: General Endotracheal  EBL: 200cc  SPECIMENS: None  DRAINS: None  COMPLICATIONS: None immediate  CONDITION: Stable to ICU  HISTORY: [name] is a 51 y.o. male Admitted to the hospital after [facility] with multiple orthopedic injuries.  He was also noted to have significant  Upper cervical spinal cord injury, with dense quadriparesis.  CT scan did not demonstrate any cervical fractures, however further workup with MRI did demonstrate significant ligamentous disruption with likely instability at the [facility] occipital as well as the atlantoaxial junctions.  Surgical stabilization wastherefore indicated.  PROCEDURE IN DETAIL: After informed consent was obtained and witnessed, the patient was brought to the operating room. After induction of general anesthesia, the patient was positioned on the operative table in the [place] head holder in the prone position. All pressure points were meticulously padded. Care was taken to avoid any movement away from the neutral position.  Positioning was confirmed with a lateral fluoroscopy to make sure the patient was not in excessive flexion or extension. Skin incision was then marked out and prepped and draped in the usual sterile fashion.    After time-out was conducted, skin incision was made sharply.  Bovie electrocautery was used to dissect through subcutaneous tissue until the nuchal fascia was identified and incised.  The suboccipital musculature was then divided in the  midline plane, and the fascial attachment at the superior nuchal line was divided in a Y-shaped fashion.  The occiput was then identified and subperiosteal dissection was carried out to identify the foramen magnum.  In this fashion, the lamina and lateral mass of C3, C2, and C1 were all identified.  Position was again confirmed with intraoperative fluoroscopy.    At this point, with fluoroscopic guidance, the C1 lateral mass bilaterally were identified  And pilot holes were drilled.  Holes were then tapped to 16 mm.  26 mm screws were then placed which included 10 mm smooth shanks.    Again, with fluoroscopic guidance the lateral edge of the spinal canal and the  Medial border of the C2 pedicle was identified bilaterally.   These landmarks were then used in conjunction with fluoroscopy to drill pilot holes for pedicle screws bilaterally.  No breaches were identified, and the holes were tapped to 18 mm.  18 mm screws were then placed in the C2 pedicles bilaterally.    Finally, C3 lateral mass was identified, and using standard anatomic landmarks,  Lateral mass  Trajectory pilot hole was drilled, tapped, and 14 mm screws were placed.    At this point, pre bent occipital cervical rod was then placed, and cut to size.  After placement in the cervical screws, the occipital screws were drilled, tapped, and placed on both sides.  Set screws were placed in the cervical screws and final tightened.  Final lateral fluoroscopy confirmed good position of the implanted hardware.    At this point, high-speed drill was used to decorticate the exposed posterior bone surfaces including the occipital bone around the region of the foramen magnum, the C1 spinous process, lamina, and lateral mass, C2 spinous  process, lamina, lateral mass, and C3 spinous process, lamina, and lateral mass.  Morselized bone allograft was then placed over the decorticated bone surfaces as well as small strips of BMP.  At this point the wound is  irrigated with copious amounts of normal saline irrigation. Good hemostasis was secured.  Muscle was then closed in multiple layers using a combination of 0 Vicryl stitches.  Skin was then closed with staples.  Sterile dressing was then applied.  The patient was then removed from the [place] head holder and transferred to the stretcher.  Cervical collar was then replaced.  At the end of the case all sponge, needle, instrument, and cottonoid counts were correct.  The patient was then taken back to the intensive care unit in stable hemodynamic condition.

## 2017-05-27 NOTE — Progress Notes (Signed)
Speech Language Pathology Daily Session Note  Patient Details  Name: [name] MRN: [id number] Date of Birth: [birth year]  Today's Date: 05/27/2017 SLP Individual Time: 0730-0830 SLP Individual Time Calculation (min): 60 min  Short Term Goals: Week 1: SLP Short Term Goal 1 (Week 1): Pt will utilize increased vocal intensity and slow rate to achieve intelligibility at the phrase level with mod assist verbal cues.   SLP Short Term Goal 2 (Week 1): Pt will recall basic, daily information with mod assist verbal cues for use of external aids.   SLP Short Term Goal 3 (Week 1): Pt will identify at least 2 new deficits and their impact on his functional independence with mod assist verbal cues.   SLP Short Term Goal 4 (Week 1): Pt will verbalize at least 2 safety precautions with mod assist verbal cues.   SLP Short Term Goal 5 (Week 1): Pt will consume thin liquids via syringe no more than 5 cc without overt s/s of aspiration.   Skilled Therapeutic Interventions: Skilled treatment session focused on speech communication goals, self care and dysphagia goals. SLP facilitated session by donning PMSV and skilled observation of pt consuming ice chips and thin water via spoon following oral care. Pt with increased oral motor control as evidenced by an anterior spillage with liquids presented by spoon. Pt with 1 throat clear via spoon possibly d/t increased bolus size. Pt able to achieve ~ 85% speech intelligibility at the sentence level with Mod I cues. SLP further facilitated session by providing education on current deficits and swallow functions and pt unable to verbalize safety precautions. SLP took off PMSV and left with nursing on bedpan. Continue per current plan of care.      Function:  Eating Eating   Modified Consistency Diet: No Eating Assist Level: Helper feeds patient           Cognition Comprehension Comprehension assist level: Understands basic 75 - 89% of the time/ requires  cueing 10 - 24% of the time  Expression   Expression assist level: Expresses basic 75 - 89% of the time/requires cueing 10 - 24% of the time. Needs helper to occlude trach/needs to repeat words.  Social Interaction Social Interaction assist level: Interacts appropriately 75 - 89% of the time - Needs redirection for appropriate language or to initiate interaction.  Problem Solving    Memory Memory assist level: Recognizes or recalls 50 - 74% of the time/requires cueing 25 - 49% of the time    Pain    Therapy/Group: Individual Therapy  [name] 05/27/2017, 10:13 AM

## 2017-05-27 NOTE — Progress Notes (Addendum)
Physical Therapy Note  Patient Details  Name: [name] MRN: [id number] Date of Birth: 24-Dec-1965 Today's Date: 05/27/2017  tx 1: On 3L O2 bottle, via trach 98% O2 sats, HR 97 with activity  1300-1400, 60 min individual tx Pain: "pain, pain!" R chest, breast area  Slide board transfer to R , level, w/c> mat.  Sitting EOM , pt c/o chest pain, relieved with reclining back with support of person behind him.  Lateral leans to L elbow, x 10 reps, with break for discomfort.  Slight muscle activity detected in palm of hand with L forearm/palmar wt bearing . Active assistive long arc quad knee ext and hip flex x 10 R/ L with pt's trunk reclined on large wedge and pillow. Slide board transfer to L to return to w/c.  Pt left resting in tilt in space w/c, tilted back all the way with soft call bell in L hand.  tx 2:  1450-1515, 25 min On 4L wall O2; 98% O2 sats, 83 HR. Pain: 4/10 R pectoral region, tender to slightest touch; repositioning with shoulder/arm supported helped.  (student) Nsg in room giving meds aware of pain.   Slide board transfer to bed to R, +2; +4 to slide pt up to [facility]. Pt positioned in partial L sidelying , with soft call bell in L hand. Pt instructed this PT in his self care regarding towel under head in bed, and call bell placement. PMV removed as pt was alone in room, bed alarm set.  See function navigator for current status.    [name] 05/27/2017, 7:43 AM

## 2017-05-27 NOTE — Progress Notes (Signed)
Physical Therapy Session Note  Patient Details  Name: [name] MRN: [id number] Date of Birth: 05-08-1966  Today's Date: 05/27/2017 PT Co-Treatment Time: 1030-1115 PT Co-Treatment Time Calculation (min): 45 min  Short Term Goals: Week 1:  PT Short Term Goal 1 (Week 1): Pt will roll with max assist +1 PT Short Term Goal 2 (Week 1): Pt will tolerate being up out of bed for 4 hours per day PT Short Term Goal 3 (Week 1): Pt will static sit with min assist   Skilled Therapeutic Interventions/Progress Updates:   Pt received lying in bed, agreeable to therapy. Denies pain. Skilled co-treat with OT to address transfers and sitting balance. Pt total assist +2 for rolling to don abdominal binder. Total assist +2 for supine>sit with elevated HOB. Pt total assist +3 for bed/chair and chair/mat sliding board transfer. Pt min assist with sitting balance on mat table. Pt able to lean left/right forward/backward with min guard but requires max assist to recover from LOB. Pt able to reach for chin with LUE through partial ROM with min assist. Pt unable to initiate reaching with RUE. Pt performed LAQ with bilat LE through partial ROM. Pt reported pain in chest, "pressure". Pain decreased with lying supine. Vitals assessed and WNL. OT alerted RN to chest pain. Pt left sitting up in wheelchair, all needs in reach. NT present.   Therapy Documentation Precautions:  Precautions Precautions: Fall, Cervical Required Braces or Orthoses: Cervical Brace Cervical Brace: Hard collar, At all times Restrictions Weight Bearing Restrictions: Yes RLE Weight Bearing: Non weight bearing LLE Weight Bearing: Non weight bearing  Vital Signs: BP 123/75  See Function Navigator for Current Functional Status.   Therapy/Group: Co-Treatment  Gwinda Passe, SPT 05/27/2017, 12:06 PM

## 2017-05-27 NOTE — Progress Notes (Signed)
Orthopedic Tech Progress Note Patient Details:  [name] 04-08-1966 [id number]  Patient ID: [name], male   DOB: [birth year], 51 y.o.   MRN: [id number]   [place] Pink 05/27/2017, 3:41 PMCalled [employer] for Cam boot left.

## 2017-05-28 ENCOUNTER — Inpatient Hospital Stay (HOSPITAL_COMMUNITY): Payer: BC Managed Care – PPO

## 2017-05-28 ENCOUNTER — Inpatient Hospital Stay (HOSPITAL_COMMUNITY): Payer: Self-pay

## 2017-05-28 ENCOUNTER — Inpatient Hospital Stay (HOSPITAL_COMMUNITY): Payer: Self-pay | Admitting: Physical Therapy

## 2017-05-28 ENCOUNTER — Inpatient Hospital Stay (HOSPITAL_COMMUNITY): Payer: Self-pay | Admitting: Speech Pathology

## 2017-05-28 LAB — GLUCOSE, CAPILLARY
GLUCOSE-CAPILLARY: 112 mg/dL — AB (ref 65–99)
GLUCOSE-CAPILLARY: 157 mg/dL — AB (ref 65–99)
Glucose-Capillary: 105 mg/dL — ABNORMAL HIGH (ref 65–99)
Glucose-Capillary: 119 mg/dL — ABNORMAL HIGH (ref 65–99)
Glucose-Capillary: 119 mg/dL — ABNORMAL HIGH (ref 65–99)
Glucose-Capillary: 132 mg/dL — ABNORMAL HIGH (ref 65–99)
Glucose-Capillary: 98 mg/dL (ref 65–99)

## 2017-05-28 NOTE — Progress Notes (Signed)
Physical Therapy Session Note  Patient Details  Name: [name] MRN: [id number] Date of Birth: 1966-06-20  Today's Date: 05/28/2017 PT Individual Time: 1430-1500 PT Individual Time Calculation (min): 30 min   Short Term Goals: Week 1:  PT Short Term Goal 1 (Week 1): Pt will roll with max assist +1 PT Short Term Goal 2 (Week 1): Pt will tolerate being up out of bed for 4 hours per day PT Short Term Goal 3 (Week 1): Pt will static sit with min assist   Skilled Therapeutic Interventions/Progress Updates:   Pt up in w/c with PMSV donned. Pt request to return back to bed. Utilized slideboard for functional transfer training with +2 assist using bench for BLE support due to decrease tolerance of BLE lowered from pain stand point. Focused on pt assisting with trunk control though still requires total +2 assist for management of trunk and BLE though pt tolerated well. Pt seated in long sitting prior to transfer wih hamstring stretch and pt reports it feeling good - educated on this position during the day at times. Also educated on importance of reporting "headache" pain by location rather than just general "headache" to help MD determine pain management. Pt verbalized understanding though requires repeated education for understanding. Positioned in bed with BUE elevated on pillows and BLE placed with pillows in position to decrease tendency for external rotation. Soft touch call bell in reach.   Therapy Documentation Precautions:  Precautions Precautions: Fall, Cervical Required Braces or Orthoses: Cervical Brace Cervical Brace: Hard collar, At all times Restrictions Weight Bearing Restrictions: Yes RLE Weight Bearing: Non weight bearing LLE Weight Bearing: Non weight bearing    Pain: Reports pain in back of head - premedicated and RN aware.   See Function Navigator for Current Functional Status.   Therapy/Group: Individual Therapy  Canary Brim Ivory Broad, PT,  DPT  05/28/2017, 9:08 PM

## 2017-05-28 NOTE — Progress Notes (Signed)
Physical Therapy Session Note  Patient Details  Name: [name] MRN: [id number] Date of Birth: [birth year]  Today's Date: 05/28/2017 PT Individual Time: 1130-1200 PT Individual Time Calculation (min): 30 min   Short Term Goals: Week 1:  PT Short Term Goal 1 (Week 1): Pt will roll with max assist +1 PT Short Term Goal 2 (Week 1): Pt will tolerate being up out of bed for 4 hours per day PT Short Term Goal 3 (Week 1): Pt will static sit with min assist   Skilled Therapeutic Interventions/Progress Updates:    Pt received lying in bed, agreeable to therapy. Pt lethargic through most of session. PROM to bilat shoulder flexion/abduction, elbow flexion/extension, wrist flexion/extension & bilat hip flexion/extension, knee flexion/extension and L ankle DF/PF. Pain in L shoulder with initial PROM, subsided with stretching. Pt reported needing bed pan. Pt unable to have bowel movement. Pt total assist +2 with for rolling onto bed pan and rolling to don brief. Pt performed 2 sets, 10 reps of SAQ. Pt left lying in bed, all needs in reach.   Therapy Documentation Precautions:  Precautions Precautions: Fall, Cervical Required Braces or Orthoses: Cervical Brace Cervical Brace: Hard collar, At all times Restrictions Weight Bearing Restrictions: Yes RLE Weight Bearing: Non weight bearing LLE Weight Bearing: Non weight bearing    See Function Navigator for Current Functional Status.   Therapy/Group: Individual Therapy  Gwinda Passe, SPT 05/28/2017, 12:40 PM

## 2017-05-28 NOTE — Progress Notes (Signed)
Speech Language Pathology Daily Session Note  Patient Details  Name: [name] MRN: [id number] Date of Birth: 1966/03/06  Today's Date: 05/28/2017 SLP Individual Time: 1300-1345 SLP Individual Time Calculation (min): 45 min  Short Term Goals: Week 1: SLP Short Term Goal 1 (Week 1): Pt will utilize increased vocal intensity and slow rate to achieve intelligibility at the phrase level with mod assist verbal cues.   SLP Short Term Goal 2 (Week 1): Pt will recall basic, daily information with mod assist verbal cues for use of external aids.   SLP Short Term Goal 3 (Week 1): Pt will identify at least 2 new deficits and their impact on his functional independence with mod assist verbal cues.   SLP Short Term Goal 4 (Week 1): Pt will verbalize at least 2 safety precautions with mod assist verbal cues.   SLP Short Term Goal 5 (Week 1): Pt will consume thin liquids via syringe no more than 5 cc without overt s/s of aspiration.   Skilled Therapeutic Interventions:  Pt was seen for skilled ST targeting communication and dysphagia goals.  Pt asleep upon arrival but was awakened to voice and light touch.  Despite being easily awakened, pt remained lethargic throughout therapy session.  Pt consumed thin liquids and purees via syringe with intermittent delayed coughing which SLP suspects to be related to positioning as s/s were noted to decrease with repositioning to more upright posture.  Pt needed mod assist verbal cues for increased vocal intensity and slow rate to achieve intelligibility at the word/phrase level with speaking valve placed.  Pt needed initial encouragement for verbal expression as he was noted to revert to eye-blinking method of communication, even with valve in place.  Pt with complaints of 7/10 headache.  RN made aware.  Pt was resistant to participating in therapy any further due to pain and required increased cues to maintain alertness towards the end of today's therapy session.   As a result, treatment was ended early and pt missed 15 min of skilled ST.  Pt was left in wheelchair with soft touch call bell within reach.       Function:  Eating Eating   Modified Consistency Diet: No Eating Assist Level: Helper feeds patient           Cognition Comprehension Comprehension assist level: Understands basic 90% of the time/cues < 10% of the time  Expression   Expression assist level: Expresses basic 25 - 49% of the time/requires cueing 50 - 75% of the time. Uses single words/gestures.  Social Interaction Social Interaction assist level: Interacts appropriately 75 - 89% of the time - Needs redirection for appropriate language or to initiate interaction.  Problem Solving Problem solving assist level: Solves basic 50 - 74% of the time/requires cueing 25 - 49% of the time  Memory Memory assist level: Recognizes or recalls 75 - 89% of the time/requires cueing 10 - 24% of the time    Pain Pain Assessment Pain Assessment: 0-10 Pain Score: 7  Pain Type: Acute pain Pain Location: Head Pain Descriptors / Indicators: Headache Pain Intervention(s): RN made aware  Therapy/Group: Individual Therapy  Tarynn Garling, Selinda Orion 05/28/2017, 2:03 PM

## 2017-05-28 NOTE — Progress Notes (Signed)
[facility] PHYSICAL MEDICINE & REHABILITATION     PROGRESS NOTE    Subjective/Complaints: States he has headache. Family apparently very concerned overnight and requested an "MRI". Pt complains of generalized headache. Also has neck pain.   ROS: pt denies nausea, vomiting, diarrhea, cough, shortness of breath or chest pain   Objective: Vital Signs: Blood pressure 118/71, pulse 87, temperature 98.6 F (37 C), temperature source Axillary, resp. rate 18, weight 88.2 kg (194 lb 7.1 oz), SpO2 96 %. No results found.  Recent Labs  05/27/17 1027  WBC 7.9  HGB 10.5*  HCT 33.1*  PLT 477*    Recent Labs  05/27/17 1027  NA 133*  K 4.0  CL 98*  GLUCOSE 112*  BUN 19  CREATININE 0.43*  CALCIUM 9.4   CBG (last 3)   Recent Labs  05/27/17 2004 05/28/17 0012 05/28/17 0443  GLUCAP 119* 132* 119*    Wt Readings from Last 3 Encounters:  05/28/17 88.2 kg (194 lb 7.1 oz)  05/21/17 99 kg (218 lb 3.2 oz)  01/11/17 91.6 kg (202 lb)    Physical Exam:  Constitutional: He appears well-developedand well-nourished. He is in no distress.  HENT:  Head: Normocephalicand atraumatic.  Jaws wired. phonation better with PMV Eyes:  Unable to move right eye laterally. Left eye injected and limited to lateral fields---able to see through left eye Neck:  Immobilized by Aspen collar.head more neutral.  Cardiovascular:. RRR without murmur. No JVD      Respiratory: normal effort.  GI: Soft. He exhibits distension. Bowel sounds are present. PEG site normal.  There is tenderness.  Genitourinary:  Genitourinary Comments: Foley in place.   Musculoskeletal: He exhibits continued edemaand pain, bruising left knee.  Min edema left thigh with surgical dressing on left hip. Left ankle splinted with ACE. CAM boot in place on right foot.  Neurological: alert. He appears alert. Follows simple commands.  Motor 1-2/5 UE and LE's,   inconsistent, braces/splint in place. Does sense deep pain stim.  Left III, VI nerve weakness but seems to have some movement. Right VI deficit. .Oriented to person and place.   Skin: Skin is warmand dry.  Scattered wounds/dry Psychiatric:  Fairly pleasant and cooperative  Assessment/Plan: 1. Functional deficits, tetraplegia secondary to TBI/cervical SCI which require 3+ hours per day of interdisciplinary therapy in a comprehensive inpatient rehab setting. Physiatrist is providing close team supervision and 24 hour management of active medical problems listed below. Physiatrist and rehab team continue to assess barriers to discharge/monitor patient progress toward functional and medical goals.  Function:  Bathing Bathing position      Bathing parts   Body parts bathed by helper: Left arm, Right arm, Chest, Abdomen, Front perineal area, Buttocks, Right upper leg, Left upper leg, Right lower leg, Left lower leg  Bathing assist Assist Level: 2 helpers      Upper Body Dressing/Undressing Upper body dressing                    Upper body assist Assist Level:  (total A)      Lower Body Dressing/Undressing Lower body dressing   What is the patient wearing?: Hospital Gown                              Lower body assist Assist for lower body dressing: 2 Helpers      Toileting Toileting Toileting activity did not occur: No continent bowel/bladder event  Toileting steps completed by helper: Adjust clothing prior to toileting, Performs perineal hygiene, Adjust clothing after toileting (per [place], NT report)    Toileting assist Assist level: Two helpers (per [place], [place] report)   Transfers Chair/bed transfer   Chair/bed transfer method: Lateral scoot Chair/bed transfer assist level: 2 helpers Chair/bed transfer assistive device: Sliding board Mechanical lift: Maximove   Locomotion Ambulation Ambulation activity did not occur: Safety/medical Editor, commissioning activity did not occur:  Safety/medical concerns Type: Manual   Assist Level: Dependent (Pt equals 0%)  Cognition Comprehension Comprehension assist level: Understands basic 90% of the time/cues < 10% of the time  Expression Expression assist level: Expresses basic 90% of the time/requires cueing < 10% of the time.  Social Interaction Social Interaction assist level: Interacts appropriately 90% of the time - Needs monitoring or encouragement for participation or interaction.  Problem Solving Problem solving assist level: Solves basic 90% of the time/requires cueing < 10% of the time  Memory Memory assist level: Recognizes or recalls 90% of the time/requires cueing < 10% of the time   Medical Problem List and Plan: 1. Functional, cognitive and mobility deficitssecondary to Moderate TBI, C4 spinal cord injury -CIR PT, OT, speech  -no clinical neurological changes 2. DVT Prophylaxis/Anticoagulation: Pharmaceutical: Lovenox 3. Chronic neck pain/Pain Management: On Gabapentin bid with hycet prn for pain, Asking for pain medication  -left knee pain, likely soft tissue, ligamentous. Wrap/support as able   -work up further as outpt  -scheduled robaxin for cervicalgia/dystonia, cervical rom/collar  -Headaches are multifactorial related to [facility] and occipital/cervical spine fractures   -add low dose topamax    -hydrocodone as above   -reassured patient/speak with family 4. Mood: LCSW to follow for evaluation and support when appropriate.  5. Neuropsych: This patient is notcapable of making decisions on hisown behalf. 6. Skin/Wound Care: Air mattress overlay. Maintain adequate nutritional and hydration status.  7. Fluids/Electrolytes/Nutrition: Monitor I/O.   -continue TF  -follow up labs Monday  9. Left periorbital cellulitis/ CN III/VI palsy: 10Left femur fracture s/p IM nail:NWB X 8 weeks. 11. Left open tibial fracture s/p I and D with ORIF: NWB--convert to CAM boot? Follow up with ortho.  11. Left  mandibular fracture s/p CR /tongue laceration: Downsized to CFS # 4 on 9/17 -To leave wires in place for 4 weeks from 8/29? .  12. Chemosis left eye: Continue Lacrilube every 3 hours till resolved per opthalmology.  13. Facial cellulitis: Keflex abx day 12 of 14 14. Leucocytosis: Monitor for signs of infection. 15. Thrombocytosis: Likely reactive. Dopplers pending. Continue lovenox.  16. ABLA: hgb stable at 9.4  17. Abnormal LFTs: Question due to shocked liver.  34. H/o of MDD: Was on Trintellix 5 mg and Risperidone 5 mg bid at home--continue to hold for now.   -behavior reasonable at present 19. Urine retention: foley replaced   LOS (Days) 7 A FACE TO FACE EVALUATION WAS PERFORMED  Meredith Staggers, MD 05/28/2017 8:58 AM

## 2017-05-28 NOTE — Progress Notes (Signed)
Patient cath stop draining after 10 pm, at around 2:30 am patient started complaining of his bladder, when scan he was retaining 600 of urine, on call notified, he ordered to remove and reinsert foley, order was carried out per protocol and patient was able to urinate through the cath. We continue to monitor.

## 2017-05-28 NOTE — Progress Notes (Signed)
Physical Therapy Session Note  Patient Details  Name: [name] MRN: [id number] Date of Birth: 1966/07/26  Today's Date: 05/28/2017 PT Individual Time: 1130-1200 PT Individual Time Calculation (min): 30 min   Short Term Goals: Week 1:  PT Short Term Goal 1 (Week 1): Pt will roll with max assist +1 PT Short Term Goal 2 (Week 1): Pt will tolerate being up out of bed for 4 hours per day PT Short Term Goal 3 (Week 1): Pt will static sit with min assist   Skilled Therapeutic Interventions/Progress Updates:   Pt supine upon arrival and agreeable to therapy, no c/o pain, requesting to be cleaned as he had soiled his brief. 2nd helper present to assist w/ rolling bilaterally and performed pericare w/ Total A. Pt total A for rolling. Transitioned to EOB w/ Total A x2 and bed<>chair w/ Max-Total A x2. Educated pt on importance of spending time upright in w/c to get his body used to it, pt verbalized understanding and in agreement. Pt requesting to see rest of rehab unit and oriented to unit w/ Total A using w/c. Pt interacting w/ staff appropriately and appreciative. Ended session in room in care of respiratory therapist, call bell within reach and all needs met.   Therapy Documentation Precautions:  Precautions Precautions: Fall, Cervical Required Braces or Orthoses: Cervical Brace Cervical Brace: Hard collar, At all times Restrictions Weight Bearing Restrictions: Yes RLE Weight Bearing: Non weight bearing LLE Weight Bearing: Non weight bearing Vital Signs: Therapy Vitals Pulse Rate: 81 Resp: 18 Patient Position (if appropriate): Sitting Oxygen Therapy SpO2: 97 % O2 Device: (S) Not Delivered (per o2 sats ) O2 Flow Rate (L/min): 5 L/min FiO2 (%): 28 %  See Function Navigator for Current Functional Status.   Therapy/Group: Individual Therapy  Synethia Endicott K Arnette 05/28/2017, 12:06 PM

## 2017-05-28 NOTE — Progress Notes (Signed)
Occupational Therapy Session Note  Patient Details  Name: [name] MRN: [id number] Date of Birth: 31-Mar-1966  Today's Date: 05/28/2017 OT Individual Time: 0830-0900 OT Individual Time Calculation (min): 30 min  and Today's Date: 05/28/2017 OT Missed Time: 30 Minutes Missed Time Reason: Pain   Short Term Goals: Week 1:  OT Short Term Goal 1 (Week 1): Pt will engage in static sitting for 3 minutes with  mod A or less for sitting balance.  OT Short Term Goal 2 (Week 1): Pt verbalize NWB precautions with 100% accuracy for 3 consecutive sessions.   Skilled Therapeutic Interventions/Progress Updates:    Pt resting in bed upon arrival with RN present.  Pt c/o "really bad" HA and declined OOB activities.  BUE PROM and AAROM at bed level.  Pt did not open eyes during time.  RN admin meds. Pt missed 30 mins skilled OT services 2/2 HA.   Therapy Documentation Precautions:  Precautions Precautions: Fall, Cervical Required Braces or Orthoses: Cervical Brace Cervical Brace: Hard collar, At all times Restrictions Weight Bearing Restrictions: Yes RLE Weight Bearing: Non weight bearing LLE Weight Bearing: Non weight bearing General: General OT Amount of Missed Time: 30 Minutes Pain:  Pt c/o "really bad" HA; RN aware and meds admin  See Function Navigator for Current Functional Status.   Therapy/Group: Individual Therapy  Leroy Libman 05/28/2017, 9:30 AM

## 2017-05-29 ENCOUNTER — Inpatient Hospital Stay (HOSPITAL_COMMUNITY): Payer: BC Managed Care – PPO

## 2017-05-29 ENCOUNTER — Encounter (HOSPITAL_COMMUNITY): Payer: Self-pay | Admitting: Psychology

## 2017-05-29 ENCOUNTER — Inpatient Hospital Stay (HOSPITAL_COMMUNITY): Payer: Self-pay | Admitting: Occupational Therapy

## 2017-05-29 ENCOUNTER — Inpatient Hospital Stay (HOSPITAL_COMMUNITY): Payer: Self-pay | Admitting: *Deleted

## 2017-05-29 ENCOUNTER — Inpatient Hospital Stay (HOSPITAL_COMMUNITY): Payer: Self-pay | Admitting: Physical Therapy

## 2017-05-29 ENCOUNTER — Inpatient Hospital Stay (HOSPITAL_COMMUNITY): Payer: BC Managed Care – PPO | Admitting: Speech Pathology

## 2017-05-29 ENCOUNTER — Inpatient Hospital Stay (HOSPITAL_COMMUNITY): Payer: Self-pay

## 2017-05-29 DIAGNOSIS — F332 Major depressive disorder, recurrent severe without psychotic features: Secondary | ICD-10-CM

## 2017-05-29 LAB — GLUCOSE, CAPILLARY
GLUCOSE-CAPILLARY: 129 mg/dL — AB (ref 65–99)
GLUCOSE-CAPILLARY: 114 mg/dL — AB (ref 65–99)
GLUCOSE-CAPILLARY: 110 mg/dL — AB (ref 65–99)
Glucose-Capillary: 133 mg/dL — ABNORMAL HIGH (ref 65–99)
Glucose-Capillary: 102 mg/dL — ABNORMAL HIGH (ref 65–99)

## 2017-05-29 MED ORDER — JEVITY 1.5 CAL/FIBER PO LIQD
1000.0000 mL | ORAL | Status: DC
  Administered 2017-05-29 – 2017-06-06 (×8): 1000 mL
  Filled 2017-05-29 (×18): qty 1000

## 2017-05-29 MED ORDER — LIDOCAINE 5 % EX PTCH
1.0000 | MEDICATED_PATCH | CUTANEOUS | Status: DC
  Administered 2017-05-30 – 2017-06-28 (×28): 1 via TRANSDERMAL
  Filled 2017-05-29 (×28): qty 1

## 2017-05-29 MED ORDER — TOPIRAMATE 25 MG PO TABS
25.0000 mg | ORAL_TABLET | Freq: Every day | ORAL | Status: DC
  Administered 2017-05-29: 25 mg
  Filled 2017-05-29: qty 1
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
## 2017-05-29 NOTE — Progress Notes (Signed)
Speech Language Pathology Daily Session Note  Patient Details  Name: [name] MRN: [id number] Date of Birth: 01-27-1966  Today's Date: 05/29/2017 SLP Individual Time: 1435-1500 SLP Individual Time Calculation (min): 25 min  Short Term Goals: Week 1: SLP Short Term Goal 1 (Week 1): Pt will utilize increased vocal intensity and slow rate to achieve intelligibility at the phrase level with mod assist verbal cues.   SLP Short Term Goal 2 (Week 1): Pt will recall basic, daily information with mod assist verbal cues for use of external aids.   SLP Short Term Goal 3 (Week 1): Pt will identify at least 2 new deficits and their impact on his functional independence with mod assist verbal cues.   SLP Short Term Goal 4 (Week 1): Pt will verbalize at least 2 safety precautions with mod assist verbal cues.   SLP Short Term Goal 5 (Week 1): Pt will consume thin liquids via syringe no more than 5 cc without overt s/s of aspiration.   Skilled Therapeutic Interventions: Skilled treatment session focused on speech goals. Upon arrival, patient was awake while upright in the wheelchair with reports of 7/10 chest pain. RN aware. PMSV was donned and patient tolerated with all vitals remaining WFL. Patient participated in a functional conversation at the phrase level that focused on patient's current swallowing function, diet recommendations and change in treatment plan with 90% intelligibility and Min A verbal cues needed for use of an increased vocal intensity. Patient declined liquids, suspect due to pain and fatigue. Patient left upright in wheelchair with all needs within reach and PMSV removed. Continue with current plan of care.      Function:  Cognition Comprehension Comprehension assist level: Understands basic 90% of the time/cues < 10% of the time  Expression Expression assistive device: Talk trach valve Expression assist level: Expresses basic 50 - 74% of the time/requires cueing 25 - 49% of  the time. Needs to repeat parts of sentences.  Social Interaction Social Interaction assist level: Interacts appropriately 75 - 89% of the time - Needs redirection for appropriate language or to initiate interaction.  Problem Solving Problem solving assist level: Solves basic 50 - 74% of the time/requires cueing 25 - 49% of the time  Memory Memory assist level: Recognizes or recalls 75 - 89% of the time/requires cueing 10 - 24% of the time    Pain No/Denies Pain   Therapy/Group: Individual Therapy  Anthany Thornhill 05/29/2017, 3:26 PM
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
## 2017-05-29 NOTE — Progress Notes (Signed)
Physical Therapy Weekly Progress Note  Patient Details  Name: [name] MRN: [id number] Date of Birth: 03-Feb-1966  Beginning of progress report period: May 22, 2017 End of progress report period: May 29, 2017  Today's Date: 05/29/2017 PT Individual Time: 0800-0900 and 1300-1400 PT Individual Time Calculation (min): 60 min and 60 min (total 120 min)   Patient has met 2 of 3 short term goals.  Pt currently requires maxA +2 for rolling and supine <>sit. TotalA +2 transfers w/c <>bed with transfer board due to decreased UE activation to assist and NWB BLE. Sitting balance on edge of mat able to perform with close S statically, requires min/modA for dynamic weight shifts. Able to demonstrate partial AROM in all extremities, occasional extensor/adductor spasms in BLEs.Patient is tolerating room air and PMSV in all sessions with stable vitals.  Patient is primarily limited by pain and fatigue. Ongoing education regarding SCI and weight bearing precautions impact on recovery provided in all sessions.   Patient continues to demonstrate the following deficits muscle weakness, muscle joint tightness and muscle paralysis, decreased cardiorespiratoy endurance and decreased oxygen support, impaired timing and sequencing, abnormal tone, unbalanced muscle activation and decreased coordination and decreased sitting balance, decreased postural control and decreased balance strategies and therefore will continue to benefit from skilled PT intervention to increase functional independence with mobility.  Patient progressing toward long term goals..  Continue plan of care.  PT Short Term Goals Week 1:  PT Short Term Goal 1 (Week 1): Pt will roll with max assist +1 PT Short Term Goal 1 - Progress (Week 1): Not met PT Short Term Goal 2 (Week 1): Pt will tolerate being up out of bed for 4 hours per day PT Short Term Goal 2 - Progress (Week 1): Met PT Short Term Goal 3 (Week 1): Pt will static  sit with min assist  PT Short Term Goal 3 - Progress (Week 1): Met Week 2:  PT Short Term Goal 1 (Week 2): Pt will demonstrate rolling R/L maxA +1 PT Short Term Goal 2 (Week 2): Pt will demonstrate supine>sit maxA +1 PT Short Term Goal 3 (Week 2): Pt will demonstrate dynamic sitting balance x5 min on mat table with minA  Skilled Therapeutic Interventions/Progress Updates: Tx 1: Pt received semi-reclined in bed, denies pain at rest however pain evident with movement during session which pt reports is primarily in neck. Supine>sit with helicopter technique and +2A with pt able to activate BLE to assist with moving legs off EOB. Transfer bed>w/c and w/c <>mat table with totalA +2. Sitting balance on edge of mat table min guard; cues for anterior weight shift to reduce posterior LOB. Utilized therapy ball to promote thoracic extension. Sit <>supine totalA +2. Pt with incontinent bowel movement; rolling maxA +2 with pt able to perform partial AROM heel slide on BLE to assist placement of LE for rolling. Hygiene performed [place]. Returned to w/c as above; handoff to OT at end of session seated in w/c.   Tx 2: Pt received semi-reclined in bed, reports pain improved from AM but again worsening throughout session and appears positionally related worse in upright position relieved in semi-reclined. Supine>sit with logroll and maxA +2, pt assisting with LE management off EOB. Transfer bed>w/c totalA +2. Oral care performed totalA and provided pt with 10 cc applesauce, 10 cc apple juice; no coughing or difficulty with feeding. Transfer w/c <>mat table totalA +2. Once on EOB pt with increased report of chest pain at sternum, improved with  reclining onto wedge. Returned to w/c as above after several minute rest break and pain subsiding. Remained semi-reclined in w/c at end of session, all needs in reach. Pt on room air throughout session with PMSV; vitals WNL and no signs of distress.      Therapy  Documentation Precautions:  Precautions Precautions: Fall, Cervical Required Braces or Orthoses: Cervical Brace Cervical Brace: Hard collar, At all times Restrictions Weight Bearing Restrictions: Yes RLE Weight Bearing: Non weight bearing LLE Weight Bearing: Non weight bearing   See Function Navigator for Current Functional Status.  Therapy/Group: Individual Therapy  [name] 05/29/2017, 11:38 AM

## 2017-05-29 NOTE — Progress Notes (Signed)
Occupational Therapy Session Note  Patient Details  Name: [name] MRN: [id number] Date of Birth: [birth year]  Today's Date: 05/29/2017 OT Individual Time: 1005-1100 OT Individual Time Calculation (min): 55 min    Short Term Goals: Week 1:  OT Short Term Goal 1 (Week 1): Pt will engage in static sitting for 3 minutes with  mod A or less for sitting balance.  OT Short Term Goal 2 (Week 1): Pt verbalize NWB precautions with 100% accuracy for 3 consecutive sessions.   Skilled Therapeutic Interventions/Progress Updates:    Pt resting in TIS w/c upon arrival with c/o continued sternal pain.  Focus on slide board transfers and bed mobility.  Pt requires tot A +2 for transfers and bed mobility.  Pt initiates LUE bringing across chest for rolling to his R.  Pt also used spirometer with assistance holding.  Pt participated in [place] with trace elbow flexion/extension noted with facilitation.  Pt remained in bed with soft call bell placed in L hand.   Therapy Documentation Precautions:  Precautions Precautions: Fall, Cervical Required Braces or Orthoses: Cervical Brace Cervical Brace: Hard collar, At all times Restrictions Weight Bearing Restrictions: Yes RLE Weight Bearing: Non weight bearing LLE Weight Bearing: Non weight bearing   Pain:  Pt c/o 10/10 sternal pain; RN aware and meds admin at beginning of sesison  See Function Navigator for Current Functional Status.   Therapy/Group: Individual Therapy  Leroy Libman 05/29/2017, 11:02 AM

## 2017-05-29 NOTE — Progress Notes (Signed)
Occupational Therapy Weekly Progress Note  Patient Details  Name: [name] MRN: [id number] Date of Birth: [birth year]  Beginning of progress report period: May 22, 2017 End of progress report period: May 29, 2017  Patient has met 1 of 2 short term goals.  Pt progress towards STGs have been minimal and inconsistent during the past week.  Pt requires mod/max A for static sitting balance for approx 30 secs-1 min sitting EOM.  Pt requires tot A +2 for bed mobility and slide board transfers to w/c.  Pt with trace LUE biceps/triceps and L hand flexion/extension.  Pt continues to c/o sternal pain which has inhibited participation at times.   Patient continues to demonstrate the following deficits: muscle weakness, decreased cardiorespiratoy endurance and acute pain, impaired timing and sequencing, unbalanced muscle activation, decreased coordination and decreased motor planning, decreased visual acuity and decreased visual motor skills, decreased attention, decreased awareness, decreased problem solving and decreased memory and decreased sitting balance, decreased postural control, decreased balance strategies and difficulty maintaining precautions and therefore will continue to benefit from skilled OT intervention to enhance overall performance with BADL and Reduce care partner burden.  Patient progressing toward long term goals..  Continue plan of care.  OT Short Term Goals Week 1:  OT Short Term Goal 1 (Week 1): Pt will engage in static sitting for 3 minutes with  mod A or less for sitting balance.  OT Short Term Goal 1 - Progress (Week 1): Progressing toward goal OT Short Term Goal 2 (Week 1): Pt verbalize NWB precautions with 100% accuracy for 3 consecutive sessions.  OT Short Term Goal 2 - Progress (Week 1): Met Week 2:  OT Short Term Goal 1 (Week 2): Pt will engage in static sitting for 3 minutes with  mod A or less for sitting balance.  OT Short Term Goal 2 (Week 2): Pt  will direct care for bed mobility in preparation for supine>sit EOB with mod quesitoning verbal cues OT Short Term Goal 3 (Week 2): Pt will recall correct placement of slide board with mod verbal cues      Therapy Documentation Precautions:  Precautions Precautions: Fall, Cervical Required Braces or Orthoses: Cervical Brace Cervical Brace: Hard collar, At all times Restrictions Weight Bearing Restrictions: Yes RLE Weight Bearing: Non weight bearing LLE Weight Bearing: Non weight bearing    See Function Navigator for Current Functional Status.   Therapy/Group: Individual Therapy  Leroy Libman 05/29/2017, 2:51 PM

## 2017-05-29 NOTE — Progress Notes (Signed)
Patient with reports of chest pain with activity and that it feels like he's having heart attack. No shortness for breath, cough or palpitations.  Patient Exam:  Lungs:  decreased at bases.No wheezes  Heart : RRR Chest wall: Tender to palpation on right chest wall and on sternum. No left chest wall tenderness.  A/P 1. Chest wall pain: Likely due to rib pain with increase in activity +/- pressure from binder. Was controlled with meds this am but worse this afternoon per nurse. Will check CXR and EKG.  Discussed multiple rib fractures as well as the core work that he's doing likely contributing to symptoms. Will add lidocaine patch for use with activity.

## 2017-05-29 NOTE — Progress Notes (Signed)
Occupational Therapy Session Note  Patient Details  Name: Tailor Westfall MRN: [id number] Date of Birth: 06-21-1966  Today's Date: 05/29/2017 OT Individual Time: 0900-0930 OT Individual Time Calculation (min): 30 min    Short Term Goals: Week 1:  OT Short Term Goal 1 (Week 1): Pt will engage in static sitting for 3 minutes with  mod A or less for sitting balance.  OT Short Term Goal 1 - Progress (Week 1): Progressing toward goal OT Short Term Goal 2 (Week 1): Pt verbalize NWB precautions with 100% accuracy for 3 consecutive sessions.  OT Short Term Goal 2 - Progress (Week 1): Met Week 2:  OT Short Term Goal 1 (Week 2): Pt will engage in static sitting for 3 minutes with  mod A or less for sitting balance.  OT Short Term Goal 2 (Week 2): Pt will direct care for bed mobility in preparation for supine>sit EOB with mod quesitoning verbal cues OT Short Term Goal 3 (Week 2): Pt will recall correct placement of slide board with mod verbal cues  Skilled Therapeutic Interventions/Progress Updates:    1:! Pt received in the w/c from previous therapy session. Focus on positioning in w/c for optimal positioning including head rest to help decr pain in back of head and neck. Engaged in UE strengthening and ROM with chair slightly reclined back in all joints. Pt did complain of chest pain in session -RN notifed and pt's vitals all WFL.  On left and right Shoulder AROM 0-10 shoulder, elbow 0-90 flexion (with elbow supported), no AROM in wrist required A, minimal grasp and full extension (increased on left).  Left UE can more more quickly. All with elbow supported.   REturned pt to room to rest between sessions in the room.  Therapy Documentation Precautions:  Precautions Precautions: Fall, Cervical Required Braces or Orthoses: Cervical Brace Cervical Brace: Hard collar, At all times Restrictions Weight Bearing Restrictions: Yes RLE Weight Bearing: Non weight bearing LLE Weight Bearing: Non  weight bearing    Vital Signs: PT able to tolerate RA with sats above 90%  Pain:  ongoing pain in back of neck and back of head- RN aware  Made adjustments to w/c for better positioning to assist with location of pain See Function Navigator for Current Functional Status.   Therapy/Group: Individual Therapy  [facility] 05/29/2017, 6:57 PM
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
## 2017-05-29 NOTE — Progress Notes (Signed)
[facility] PHYSICAL MEDICINE & REHABILITATION     PROGRESS NOTE    Subjective/Complaints: Had a reasonable night. Still having headache. Was sleeping soundly when I arrived.   ROS: pt denies nausea, vomiting, diarrhea, cough, shortness of breath or chest pain   Objective: Vital Signs: Blood pressure 107/63, pulse 79, temperature 98 F (36.7 C), temperature source Axillary, resp. rate 16, weight 87.9 kg (193 lb 14.1 oz), SpO2 98 %. No results found.  Recent Labs  05/27/17 1027  WBC 7.9  HGB 10.5*  HCT 33.1*  PLT 477*    Recent Labs  05/27/17 1027  NA 133*  K 4.0  CL 98*  GLUCOSE 112*  BUN 19  CREATININE 0.43*  CALCIUM 9.4   CBG (last 3)   Recent Labs  05/28/17 2004 05/28/17 2351 05/29/17 0333  GLUCAP 105* 157* 129*    Wt Readings from Last 3 Encounters:  05/29/17 87.9 kg (193 lb 14.1 oz)  05/21/17 99 kg (218 lb 3.2 oz)  01/11/17 91.6 kg (202 lb)    Physical Exam:  Constitutional: He appears well-developedand well-nourished. He is in no distress.  HENT:  Head: Normocephalicand atraumatic.  Jaws wired. phonation better with PMV Eyes:  Unable to move right eye laterally. Left eye injected and limited to lateral fields---able to see through left eye Neck:  Immobilized by Aspen collar.head more neutral.  Cardiovascular:. RRR without murmur. No JVD     Respiratory: CTA Bilaterally without wheezes or rales. Normal effort   GI: Soft. NT, PEG site in place.  Genitourinary:  Genitourinary Comments: Foley in place, urine clear   Musculoskeletal: He exhibits continued edemaand pain, bruising left knee.  Min edema left thigh with surgical dressing on left hip. Left ankle splinted with ACE. CAM boot in place on right foot.  Neurological: alert. He appears alert. Follows simple commands.  Motor 1-2/5 UE and LE's,   inconsistent, braces/splint in place. Does sense deep pain stim. Left III, VI nerve weakness but seems to have some movement. Right VI deficit.  .Oriented to person and place.   Skin: Skin is warmand dry.  Scattered wounds/dry Psychiatric:  Fairly pleasant and cooperative  Assessment/Plan: 1. Functional deficits, tetraplegia secondary to TBI/cervical SCI which require 3+ hours per day of interdisciplinary therapy in a comprehensive inpatient rehab setting. Physiatrist is providing close team supervision and 24 hour management of active medical problems listed below. Physiatrist and rehab team continue to assess barriers to discharge/monitor patient progress toward functional and medical goals.  Function:  Bathing Bathing position      Bathing parts   Body parts bathed by helper: Left arm, Right arm, Chest, Abdomen, Front perineal area, Buttocks, Right upper leg, Left upper leg, Right lower leg, Left lower leg  Bathing assist Assist Level: 2 helpers      Upper Body Dressing/Undressing Upper body dressing                    Upper body assist Assist Level:  (total A)      Lower Body Dressing/Undressing Lower body dressing   What is the patient wearing?: [facility]                              Lower body assist Assist for lower body dressing: 2 Helpers      Toileting Toileting Toileting activity did not occur: No continent bowel/bladder event   Toileting steps completed by helper: Adjust clothing prior  to toileting, Performs perineal hygiene, Adjust clothing after toileting (per [place], NT report)    Toileting assist Assist level: Two helpers (per [place], [place] report)   Transfers Chair/bed transfer   Chair/bed transfer method: Lateral scoot Chair/bed transfer assist level: 2 helpers Chair/bed transfer assistive device: Sliding board, Orthosis Mechanical lift: Maximove   Locomotion Ambulation Ambulation activity did not occur: Safety/medical Editor, commissioning activity did not occur: Safety/medical concerns Type: Manual Max wheelchair distance: 300+ ft Assist  Level: Dependent (Pt equals 0%)  Cognition Comprehension Comprehension assist level: Understands basic 90% of the time/cues < 10% of the time  Expression Expression assist level: Expresses basic 25 - 49% of the time/requires cueing 50 - 75% of the time. Uses single words/gestures.  Social Interaction Social Interaction assist level: Interacts appropriately 75 - 89% of the time - Needs redirection for appropriate language or to initiate interaction.  Problem Solving Problem solving assist level: Solves basic 50 - 74% of the time/requires cueing 25 - 49% of the time  Memory Memory assist level: Recognizes or recalls 75 - 89% of the time/requires cueing 10 - 24% of the time   Medical Problem List and Plan: 1. Functional, cognitive and mobility deficitssecondary to Moderate TBI, C4 spinal cord injury -CIR PT, OT, speech  -neurologically stable 2. DVT Prophylaxis/Anticoagulation: Pharmaceutical: Lovenox 3. Chronic neck pain/Pain Management: On Gabapentin bid with hycet prn for pain, Asking for pain medication  -left knee pain, likely soft tissue, ligamentous. Wrap/support as able   -work up further as outpt  -scheduled robaxin for cervicalgia/dystonia, cervical rom/collar  -Headaches are multifactorial related to [facility] and occipital/cervical spine fractures   -add low dose topamax    -hydrocodone as above 4. Mood: LCSW to follow for evaluation and support when appropriate.  5. Neuropsych: This patient is notcapable of making decisions on hisown behalf. 6. Skin/Wound Care: Air mattress overlay. Maintain adequate nutritional and hydration status.  7. Fluids/Electrolytes/Nutrition: Monitor I/O.   -continue TF   -would prefer that po be administered by SLP only at this point 9. Left periorbital cellulitis/ CN III/VI palsy: 10Left femur fracture s/p IM nail: NWB X 8 weeks. 11. Left open tibial fracture s/p I and D with ORIF: NWB--convert to CAM boot? Follow up with ortho.  11.  Left mandibular fracture s/p CR /tongue laceration: Downsized to CFS # 4 on 9/17 -To leave wires in place for 4 weeks from 8/29? .  12. Chemosis left eye: Continue Lacrilube every 3 hours till resolved per opthalmology.  13. Facial cellulitis: Keflex abx day 13 of 14 14. Leucocytosis: Monitor for signs of infection. 15. Thrombocytosis: Likely reactive. Dopplers pending.   16. ABLA: hgb stable at 10.5 17. Abnormal LFTs: Question due to shocked liver.  45. H/o of MDD: Was on Trintellix 5 mg and Risperidone 5 mg bid at home--continue to hold for now.   -behavior reasonable at present 19. Urine retention: foley replaced   LOS (Days) 8 A FACE TO FACE EVALUATION WAS PERFORMED  Meredith Staggers, MD 05/29/2017 7:54 AM

## 2017-05-29 NOTE — Progress Notes (Signed)
Recreational Therapy Session Note  Patient Details  Name: [name] MRN: [id number] Date of Birth: Jun 03, 1966 Today's Date: 05/29/2017  Pt is not appropriate for TR services at this time.  Will continue to monitor through team for future participation.  Vedha Tercero 05/29/2017, 4:18 PM

## 2017-05-30 ENCOUNTER — Encounter (HOSPITAL_COMMUNITY): Payer: Self-pay | Admitting: Neurosurgery

## 2017-05-30 ENCOUNTER — Inpatient Hospital Stay (HOSPITAL_COMMUNITY): Payer: Self-pay | Admitting: Physical Therapy

## 2017-05-30 ENCOUNTER — Inpatient Hospital Stay (HOSPITAL_COMMUNITY): Payer: BC Managed Care – PPO | Admitting: Speech Pathology

## 2017-05-30 ENCOUNTER — Inpatient Hospital Stay (HOSPITAL_COMMUNITY): Payer: Self-pay

## 2017-05-30 LAB — GLUCOSE, CAPILLARY
GLUCOSE-CAPILLARY: 109 mg/dL — AB (ref 65–99)
GLUCOSE-CAPILLARY: 116 mg/dL — AB (ref 65–99)
GLUCOSE-CAPILLARY: 129 mg/dL — AB (ref 65–99)
GLUCOSE-CAPILLARY: 98 mg/dL (ref 65–99)
Glucose-Capillary: 129 mg/dL — ABNORMAL HIGH (ref 65–99)
Glucose-Capillary: 107 mg/dL — ABNORMAL HIGH (ref 65–99)
Glucose-Capillary: 97 mg/dL (ref 65–99)

## 2017-05-30 MED ORDER — CHLORHEXIDINE GLUCONATE 0.12 % MT SOLN
OROMUCOSAL | Status: AC
  Filled 2017-05-30: qty 15

## 2017-05-30 MED ORDER — ORAL CARE MOUTH RINSE
15.0000 mL | Freq: Four times a day (QID) | OROMUCOSAL | Status: DC
  Administered 2017-05-30 – 2017-07-19 (×125): 15 mL via OROMUCOSAL

## 2017-05-30 MED ORDER — FENTANYL 12 MCG/HR TD PT72
12.5000 ug | MEDICATED_PATCH | TRANSDERMAL | Status: DC
  Administered 2017-05-30 – 2017-06-08 (×4): 12.5 ug via TRANSDERMAL
  Filled 2017-05-30 (×4): qty 1

## 2017-05-30 NOTE — Progress Notes (Signed)
Physical Therapy Session Note  Patient Details  Name: Ediel Unangst MRN: [id number] Date of Birth: Sep 05, 1966  Today's Date: 05/30/2017 PT Individual Time: 0800-0900, 1445-1535 PT Individual Time Calculation (min): 60 min, 50 min  Short Term Goals: Week 2:  PT Short Term Goal 1 (Week 2): Pt will demonstrate rolling R/L maxA +1 PT Short Term Goal 2 (Week 2): Pt will demonstrate supine>sit maxA +1 PT Short Term Goal 3 (Week 2): Pt will demonstrate dynamic sitting balance x5 min on mat table with minA  Skilled Therapeutic Interventions/Progress Updates:   Session 1: Pt received lying in bed, agreeable to therapy. Pt total assist +2 for rolling to get off bed pan. Cues for pt engaging core, and reaching to help with activity. Total assist for hygiene. Pt total assist to don brief and clothing. PROM to bilat shoulder flexion/abduction, elbow flexion/extension, wrist flexion/extension & bilat hip flexion/extension, knee flexion/extension and L ankle DF/PF.  Total assist + 2 for supine>sit and sliding board transfer to chair. Pt on room air throughout session, O2 WNL. Pt left sitting up in chair, all needs in reach, on room air.   Session 2: Pt received sitting up in chair, agreeable to therapy. Pt on room air throughout session, O2 WNL. Pt performed bilat LAQ through partial range 3 sets, 20 reps; bilat HS curls through partial range with orange theraband resistance 1 set, 20 reps; AA reaching forward 2 sets, 10 reps. Pt limited by fatigue. Pt bed/chair sliding board transfer with total assist +2. Pt total assist for positioning in bed. Left lying in bed, positioned with pillows, all needs in reach.   Therapy Documentation Precautions:  Precautions Precautions: Fall, Cervical Required Braces or Orthoses: Cervical Brace Cervical Brace: Hard collar, At all times Restrictions Weight Bearing Restrictions: Yes RLE Weight Bearing: Non weight bearing LLE Weight Bearing: Non weight  bearing    See Function Navigator for Current Functional Status.   Therapy/Group: Individual Therapy  Gwinda Passe, SPT 05/30/2017, 10:15 AM

## 2017-05-30 NOTE — Progress Notes (Signed)
Occupational Therapy Session Note  Patient Details  Name: Nikhil Osei MRN: [id number] Date of Birth: 08-Jun-1966  Today's Date: 05/30/2017 OT Individual Time: 0930-1030 OT Individual Time Calculation (min): 60 min    Short Term Goals: Week 2:  OT Short Term Goal 1 (Week 2): Pt will engage in static sitting for 3 minutes with  mod A or less for sitting balance.  OT Short Term Goal 2 (Week 2): Pt will direct care for bed mobility in preparation for supine>sit EOB with mod quesitoning verbal cues OT Short Term Goal 3 (Week 2): Pt will recall correct placement of slide board with mod verbal cues  Skilled Therapeutic Interventions/Progress Updates:    Pt resting in TIS w/c upon arrival with wash cloth over his eyes.  Pt stated that bright light hurt his eyes and made his head hurt more.  All lights already out and blinds closed.  Initial focus on LUE therapeutic activities with reaching and grasping.  Pt requires HOH assist although pt is demonstrating increased grasp and release as well as elbow flexiion/extension.  Pt's R hand with trace finger flexion and no extension.  BUE PROM seated in TIS.  Pt remained in w/c with RN present and call placed where pt can activate.  PMSV removed.  Therapy Documentation Precautions:  Precautions Precautions: Fall, Cervical Required Braces or Orthoses: Cervical Brace Cervical Brace: Hard collar, At all times Restrictions Weight Bearing Restrictions: Yes RLE Weight Bearing: Non weight bearing LLE Weight Bearing: Non weight bearing Pain:  Pt c/o 7/10 HA/head pain; RN present and med admin  See Function Navigator for Current Functional Status.   Therapy/Group: Individual Therapy  Leroy Libman 05/30/2017, 10:39 AM

## 2017-05-30 NOTE — Progress Notes (Signed)
PHYSICAL MEDICINE & REHABILITATION     PROGRESS NOTE    Subjective/Complaints: Still having headaches. Primarily in the back of his head/upper neck. Had bm this morning.  Objective: Vital Signs: Blood pressure 124/75, pulse 80, temperature 97.9 F (36.6 C), temperature source Axillary, resp. rate 16, height 5\' 6"  (1.676 m), weight 90.8 kg (200 lb 1.6 oz), SpO2 100 %. Dg Chest 2 View  Result Date: 05/29/2017 CLINICAL DATA:  Chest pain EXAM: CHEST  2 VIEW COMPARISON:  05/14/2017 FINDINGS: Tracheostomy tube is in place, the tip is about 4.9 cm superior to the carina. Linear scarring or atelectasis at the left base. No consolidation or pleural effusion. Borderline cardiomegaly. No pneumothorax. IMPRESSION: Low lung volumes with linear atelectasis or scar at the left base. Electronically Signed   By: Donavan Foil M.D.   On: 05/29/2017 20:33    Recent Labs  05/27/17 1027  WBC 7.9  HGB 10.5*  HCT 33.1*  PLT 477*    Recent Labs  05/27/17 1027  NA 133*  K 4.0  CL 98*  GLUCOSE 112*  BUN 19  CREATININE 0.43*  CALCIUM 9.4   CBG (last 3)   Recent Labs  05/30/17 0006 05/30/17 0436 05/30/17 0825  GLUCAP 109* 129* 98    Wt Readings from Last 3 Encounters:  05/30/17 90.8 kg (200 lb 1.6 oz)  05/21/17 99 kg (218 lb 3.2 oz)  01/11/17 91.6 kg (202 lb)    Physical Exam:  Constitutional: He appears well-developedand well-nourished. He is in no distress.  HENT:  Head: Normocephalicand atraumatic.  Jaws wired.  Eyes:  Unable to move right eye laterally. Left eye injected and limited to lateral fields---able to see through left eye Neck:  Immobilized by Aspen collar.head more neutral.  Cardiovascular:. RRR without murmur. No JVD     Respiratory: CTA Bilaterally without wheezes or rales. Normal effort   GI: Soft. NT, PEG site in place.  Genitourinary:  Genitourinary Comments: foley  Musculoskeletal: He exhibits continued edemaand pain, bruising left knee.   Min edema left thigh with surgical dressing on left hip. Left ankle splinted with ACE. CAM boot in place on right foot.  Neurological: alert. He appears alert. Improved awareness. .  Motor 1-2/5 UE and LE's,   inconsistent, braces/splint in place. Does sense deep pain stim. Left III, VI nerve weakness but seems to have some movement. Right VI deficit. .Oriented to person and place.   Skin: Skin is warmand dry.  Scattered wounds/dry Psychiatric:  Fairly pleasant and cooperative  Assessment/Plan: 1. Functional deficits, tetraplegia secondary to TBI/cervical SCI which require 3+ hours per day of interdisciplinary therapy in a comprehensive inpatient rehab setting. Physiatrist is providing close team supervision and 24 hour management of active medical problems listed below. Physiatrist and rehab team continue to assess barriers to discharge/monitor patient progress toward functional and medical goals.  Function:  Bathing Bathing position      Bathing parts   Body parts bathed by helper: Left arm, Right arm, Chest, Abdomen, Front perineal area, Buttocks, Right upper leg, Left upper leg, Right lower leg, Left lower leg  Bathing assist Assist Level: 2 helpers      Upper Body Dressing/Undressing Upper body dressing                    Upper body assist Assist Level:  (total A)      Lower Body Dressing/Undressing Lower body dressing   What is the patient wearing?: [facility]  Lower body assist Assist for lower body dressing: 2 Helpers      Toileting Toileting Toileting activity did not occur: No continent bowel/bladder event   Toileting steps completed by helper: Adjust clothing prior to toileting, Performs perineal hygiene, Adjust clothing after toileting (per [place], [place] report)    Toileting assist Assist level: Two helpers (per [place], [place] report)   Transfers Chair/bed transfer   Chair/bed transfer method: Lateral  scoot Chair/bed transfer assist level: 2 helpers Chair/bed transfer assistive device: Sliding board Mechanical lift: Maximove   Locomotion Ambulation Ambulation activity did not occur: Safety/medical Editor, commissioning activity did not occur: Safety/medical concerns Type: Manual Max wheelchair distance: 300+ ft Assist Level: Dependent (Pt equals 0%)  Cognition Comprehension Comprehension assist level: Understands basic 90% of the time/cues < 10% of the time  Expression Expression assist level: Expresses basic 50 - 74% of the time/requires cueing 25 - 49% of the time. Needs to repeat parts of sentences.  Social Interaction Social Interaction assist level: Interacts appropriately 75 - 89% of the time - Needs redirection for appropriate language or to initiate interaction.  Problem Solving Problem solving assist level: Solves basic 50 - 74% of the time/requires cueing 25 - 49% of the time  Memory Memory assist level: Recognizes or recalls 75 - 89% of the time/requires cueing 10 - 24% of the time   Medical Problem List and Plan: 1. Functional, cognitive and mobility deficitssecondary to Moderate TBI, C4 spinal cord injury -CIR PT, OT, speech  -reviewed with the patient today that this will be a slow process especially given his numerous injuries and pain levels.  2. DVT Prophylaxis/Anticoagulation: Pharmaceutical: Lovenox 3. Chronic neck pain/Pain Management: On Gabapentin bid with hycet prn for pain,   -will add a fentanyl patch 18mcg/hr   -left knee pain, likely soft tissue, ligamentous. Wrap/support as able   -work up further as outpt  -scheduled robaxin for cervicalgia/dystonia, cervical rom/collar  -Headaches are multifactorial related to [facility] and occipital/cervical spine fractures   -stop topamax---pain more cervical/occipital    -hydrocodone as above 4. Mood: LCSW to follow for evaluation and support when appropriate.  5. Neuropsych: This  patient is notcapable of making decisions on hisown behalf. 6. Skin/Wound Care: Air mattress overlay. Maintain adequate nutritional and hydration status.  7. Fluids/Electrolytes/Nutrition: Monitor I/O.   -continue TF   -would prefer that po be administered by SLP only at this point 9. Left periorbital cellulitis/ CN III/VI palsy: 10Left femur fracture s/p IM nail: NWB X 8 weeks. 11. Left open tibial fracture s/p I and D with ORIF: NWB--convert to CAM boot? Follow up with ortho.  11. Left mandibular fracture s/p CR /tongue laceration: Downsized to CFS # 4 on 9/17 -To leave wires in place for 4 weeks from 8/29?---9/26 12. Chemosis left eye: Continue Lacrilube every 3 hours till resolved per opthalmology.  13. Facial cellulitis: Keflex abx day 14 of 14 14. Leucocytosis: Monitor for signs of infection. 15. Thrombocytosis: Likely reactive. Dopplers pending.   16. ABLA: hgb stable at 10.5 17. Abnormal LFTs: Question due to shocked liver.  88. H/o of MDD: Was on Trintellix 5 mg and Risperidone 5 mg bid at home--continue to hold for now.   -behavior reasonable at present 19. Urine retention: foley replaced   LOS (Days) 9 A FACE TO FACE EVALUATION WAS PERFORMED  Meredith Staggers, MD 05/30/2017 9:11 AM

## 2017-05-30 NOTE — Progress Notes (Signed)
Speech Language Pathology Weekly Progress and Session Note  Patient Details  Name: [name] MRN: [id number] Date of Birth: 1966-06-21  Beginning of progress report period: May 22, 2017 End of progress report period: May 30, 2017  Today's Date: 05/30/2017 SLP Individual Time: 0700-0800 SLP Individual Time Calculation (min): 60 min  Short Term Goals: Week 1: SLP Short Term Goal 1 (Week 1): Pt will utilize increased vocal intensity and slow rate to achieve intelligibility at the phrase level with mod assist verbal cues.   SLP Short Term Goal 1 - Progress (Week 1): Met SLP Short Term Goal 2 (Week 1): Pt will recall basic, daily information with mod assist verbal cues for use of external aids.   SLP Short Term Goal 2 - Progress (Week 1): Not met SLP Short Term Goal 3 (Week 1): Pt will identify at least 2 new deficits and their impact on his functional independence with mod assist verbal cues.   SLP Short Term Goal 3 - Progress (Week 1): Not met SLP Short Term Goal 4 (Week 1): Pt will verbalize at least 2 safety precautions with mod assist verbal cues.   SLP Short Term Goal 4 - Progress (Week 1): Not met SLP Short Term Goal 5 (Week 1): Pt will consume thin liquids via syringe no more than 5 cc without overt s/s of aspiration.  SLP Short Term Goal 5 - Progress (Week 1): Met    New Short Term Goals: Week 2: SLP Short Term Goal 1 (Week 2): Pt will utilize increased vocal intensity and slow rate to achieve intelligibility at the phrase  to sentence level with min assist verbal cues.   SLP Short Term Goal 2 (Week 2): Pt will identify at least 2 new deficits and their impact on his functional independence with mod assist verbal cues.   SLP Short Term Goal 3 (Week 2): Pt will verbalize at least 2 safety precautions with mod assist verbal cues.   SLP Short Term Goal 4 (Week 2): Pt will direct self care by answering questions regarding PO intake (i.e., amount of intake, cup vs  spoon) with Mod A cues.   Weekly Progress Updates: Pt with slow progress and as a result he has met 2 of 5 STGs. Pt with specific progress towards toleration of PMSV and consumption of small amount so thin liquids (water). Pt continues to require Mod A to Min A cues to increase vocal intensity to achieve speech intelligibility at the phrase sentence level to >75%. Pt requires Max to Mod A to direct care and demonstrate intellectual awareness. Skilled ST is required to address these deficits. Anticipate that pt will require skilled sT at discharge with possibility of continued therapy within SNF setting.      Intensity: Minumum of 1-2 x/day, 30 to 90 minutes Frequency: 3 to 5 out of 7 days Duration/Length of Stay: 4 to 5 weeks Treatment/Interventions: Cognitive remediation/compensation;Cueing hierarchy;Dysphagia/aspiration precaution training;Functional tasks;Patient/family education;Internal/external aids;Speech/Language facilitation   Daily Session  Skilled Therapeutic Interventions: Skilled treatment session focused on dysphagia, communication and self-care goals. SLP facilitated session by placing PMSV and providng Mod A to Min A cues to increase vocal intensity to ~ 75% at the phrase level.  Pt required Max A verbal question cues to answer questions regarding PMSV use and trials of thin liquids. Pt able to express and direct care for use of bedpan. SLP handed off pt to nursing. Continue per current plan of care.     Function:   Eating Eating  Modified Consistency Diet: No (Trials with SLP/therapists only) Eating Assist Level: Helper feeds patient           Cognition Comprehension Comprehension assist level: Understands basic 90% of the time/cues < 10% of the time  Expression Expression assistive device: Talk trach valve Expression assist level: Expresses basic 50 - 74% of the time/requires cueing 25 - 49% of the time. Needs to repeat parts of sentences.  Social Interaction Social  Interaction assist level: Interacts appropriately 75 - 89% of the time - Needs redirection for appropriate language or to initiate interaction.  Problem Solving Problem solving assist level: Solves basic 50 - 74% of the time/requires cueing 25 - 49% of the time  Memory Memory assist level: Recognizes or recalls 75 - 89% of the time/requires cueing 10 - 24% of the time   General    Pain    Therapy/Group: Individual Therapy  [name] 05/30/2017, 7:36 AM

## 2017-05-31 ENCOUNTER — Inpatient Hospital Stay (HOSPITAL_COMMUNITY): Payer: BC Managed Care – PPO | Admitting: Speech Pathology

## 2017-05-31 ENCOUNTER — Inpatient Hospital Stay (HOSPITAL_COMMUNITY): Payer: Self-pay | Admitting: Physical Therapy

## 2017-05-31 ENCOUNTER — Inpatient Hospital Stay (HOSPITAL_COMMUNITY): Payer: Self-pay

## 2017-05-31 ENCOUNTER — Inpatient Hospital Stay (HOSPITAL_COMMUNITY): Payer: Self-pay | Admitting: Speech Pathology

## 2017-05-31 DIAGNOSIS — N319 Neuromuscular dysfunction of bladder, unspecified: Secondary | ICD-10-CM

## 2017-05-31 LAB — GLUCOSE, CAPILLARY
GLUCOSE-CAPILLARY: 139 mg/dL — AB (ref 65–99)
GLUCOSE-CAPILLARY: 131 mg/dL — AB (ref 65–99)
GLUCOSE-CAPILLARY: 151 mg/dL — AB (ref 65–99)
Glucose-Capillary: 130 mg/dL — ABNORMAL HIGH (ref 65–99)
Glucose-Capillary: 111 mg/dL — ABNORMAL HIGH (ref 65–99)

## 2017-05-31 MED ORDER — OXYCODONE HCL 5 MG/5ML PO SOLN
10.0000 mg | ORAL | Status: DC | PRN
  Administered 2017-06-01 – 2017-06-16 (×18): 10 mg via ORAL
  Filled 2017-05-31 (×18): qty 10

## 2017-05-31 NOTE — Progress Notes (Signed)
[name] PHYSICAL MEDICINE & REHABILITATION     PROGRESS NOTE    Subjective/Complaints: Had a better night. Able to sleep. Fentanyl patch helped pain. In good spirits this morning  ROS: pt denies nausea, vomiting, diarrhea, cough, shortness of breath or chest pain    Objective: Vital Signs: Blood pressure 123/82, pulse (!) 110, temperature 99.3 F (37.4 C), temperature source Axillary, resp. rate 16, height 5\' 6"  (1.676 m), weight 89.4 kg (197 lb 1.5 oz), SpO2 98 %. Dg Chest 2 View  Result Date: 05/29/2017 CLINICAL DATA:  Chest pain EXAM: CHEST  2 VIEW COMPARISON:  05/14/2017 FINDINGS: Tracheostomy tube is in place, the tip is about 4.9 cm superior to the carina. Linear scarring or atelectasis at the left base. No consolidation or pleural effusion. Borderline cardiomegaly. No pneumothorax. IMPRESSION: Low lung volumes with linear atelectasis or scar at the left base. Electronically Signed   By: Donavan Foil M.D.   On: 05/29/2017 20:33   No results for input(s): WBC, HGB, HCT, PLT in the last 72 hours. No results for input(s): NA, K, CL, GLUCOSE, BUN, CREATININE, CALCIUM in the last 72 hours.  Invalid input(s): CO CBG (last 3)   Recent Labs  05/30/17 2347 05/31/17 0338 05/31/17 0749  GLUCAP 116* 139* 130*    Wt Readings from Last 3 Encounters:  05/31/17 89.4 kg (197 lb 1.5 oz)  05/21/17 99 kg (218 lb 3.2 oz)  01/11/17 91.6 kg (202 lb)    Physical Exam:  Constitutional: He appears well-developedand well-nourished. He is in no distress.  HENT:  Head: Normocephalicand atraumatic.  Jaws wired.  Eyes:  Unable to move right eye laterally. Left eye injected and limited to lateral fields---able to see through left eye Neck:  Immobilized by Aspen collar.head more neutral.  Cardiovascular:. RRR without murmur. No JVD     Respiratory: CTA Bilaterally without wheezes or rales. Normal effort   GI: Soft. NT, PEG site in place.  Genitourinary:  Genitourinary Comments: foley   Musculoskeletal: He exhibits continued edemaand pain, bruising left knee.  Min edema left thigh with surgical dressing on left hip. Left ankle splinted with ACE. CAM boot in place on right foot.  Neurological: alert. Distracted with poor STM.  Motor 1-2/5 UE and LE's,   inconsistent, braces/splint in place. Does sense deep pain stim. Left III, VI nerve weakness but seems to have some movement. Right VI deficit. .Oriented to person and place.   Skin: Skin is warmand dry.  Scattered wounds/dry Psychiatric:  Fairly pleasant and cooperative  Assessment/Plan: 1. Functional deficits, tetraplegia secondary to TBI/cervical SCI which require 3+ hours per day of interdisciplinary therapy in a comprehensive inpatient rehab setting. Physiatrist is providing close team supervision and 24 hour management of active medical problems listed below. Physiatrist and rehab team continue to assess barriers to discharge/monitor patient progress toward functional and medical goals.  Function:  Bathing Bathing position      Bathing parts   Body parts bathed by helper: Left arm, Right arm, Chest, Abdomen, Front perineal area, Buttocks, Right upper leg, Left upper leg, Right lower leg, Left lower leg  Bathing assist Assist Level: 2 helpers      Upper Body Dressing/Undressing Upper body dressing                    Upper body assist Assist Level:  (total A)      Lower Body Dressing/Undressing Lower body dressing   What is the patient wearing?: [facility]  Lower body assist Assist for lower body dressing: 2 Helpers      Toileting Toileting Toileting activity did not occur: No continent bowel/bladder event   Toileting steps completed by helper: Adjust clothing prior to toileting, Performs perineal hygiene, Adjust clothing after toileting (per [place], NT report)    Toileting assist Assist level: Two helpers (per [place], [place] report)    Transfers Chair/bed transfer   Chair/bed transfer method: Lateral scoot Chair/bed transfer assist level: 2 helpers Chair/bed transfer assistive device: Sliding board Mechanical lift: Maximove   Locomotion Ambulation Ambulation activity did not occur: Safety/medical Editor, commissioning activity did not occur: Safety/medical concerns Type: Manual Max wheelchair distance: 300+ ft Assist Level: Dependent (Pt equals 0%)  Cognition Comprehension Comprehension assist level: Follows basic conversation/direction with extra time/assistive device  Expression Expression assist level: Expresses basic 25 - 49% of the time/requires cueing 50 - 75% of the time. Uses single words/gestures.  Social Interaction Social Interaction assist level: Interacts appropriately 75 - 89% of the time - Needs redirection for appropriate language or to initiate interaction.  Problem Solving Problem solving assist level: Solves basic 50 - 74% of the time/requires cueing 25 - 49% of the time  Memory Memory assist level: Recognizes or recalls 75 - 89% of the time/requires cueing 10 - 24% of the time   Medical Problem List and Plan: 1. Functional, cognitive and mobility deficitssecondary to Moderate TBI, C4 spinal cord injury -CIR PT, OT, speech     2. DVT Prophylaxis/Anticoagulation: Pharmaceutical: Lovenox 3. Chronic neck pain/Pain Management: On Gabapentin bid with hycet prn for pain,   - fentanyl patch 25mcg/hr with improved pain  -left knee pain, likely soft tissue, ligamentous. Wrap/support as able   -outpt follow up  -scheduled robaxin for cervicalgia/dystonia, cervical rom/collar  -Headaches are multifactorial related to [facility] and occipital/cervical spine fractures   -stopped topamax---pain more cervical/occipital    -hydrocodone as above 4. Mood: LCSW to follow for evaluation and support when appropriate.  5. Neuropsych: This patient is notcapable of making decisions on  hisown behalf. 6. Skin/Wound Care: Air mattress overlay. Maintain adequate nutritional and hydration status.  7. Fluids/Electrolytes/Nutrition: Monitor I/O.   -continue TF   -would prefer that po be administered by SLP only at this point 9. Left periorbital cellulitis/ CN III/VI palsy: 10Left femur fracture s/p IM nail: NWB X 8 weeks. 11. Left open tibial fracture s/p I and D with ORIF: NWB--convert to CAM boot? Follow up with ortho.  11. Left mandibular fracture s/p CR /tongue laceration: Downsized to CFS # 4 on 9/17 -To leave wires in place for 4 weeks from 8/29?---9/26 12. Chemosis left eye: Continue Lacrilube every 3 hours till resolved per opthalmology.  13. Facial cellulitis: Keflex abx day 14 of 14 14. Leucocytosis: Monitor for signs of infection. 15. Thrombocytosis: Likely reactive. Dopplers pending.   16. ABLA: hgb stable at 10.5 17. Abnormal LFTs: Question due to shocked liver.  50. H/o of MDD: Was on Trintellix 5 mg and Risperidone 5 mg bid at home--continue to hold for now.   -team follow. Neuro-psych has seen patient and is following 19. Urine retention/neurogenic bladder: foley replaced  -would like to remove soon, but he's at risk for skin breakdown.   LOS (Days) 10 A Paradise T, MD 05/31/2017 10:06 AM

## 2017-05-31 NOTE — Progress Notes (Signed)
Physical Therapy Session Note  Patient Details  Name: [name] MRN: [id number] Date of Birth: [birth year]  Today's Date: 05/31/2017 PT Individual Time: 3552-1747 PT Individual Time Calculation (min): 30 min  PT Individual Missed Time Calculation (min): 15 min  Short Term Goals: Week 2:  PT Short Term Goal 1 (Week 2): Pt will demonstrate rolling R/L maxA +1 PT Short Term Goal 2 (Week 2): Pt will demonstrate supine>sit maxA +1 PT Short Term Goal 3 (Week 2): Pt will demonstrate dynamic sitting balance x5 min on mat table with minA  Skilled Therapeutic Interventions/Progress Updates:   Pt supine upon arrival and unwilling to get OOB secondary to LLE pain - RN made aware and present to provide medication. Repositioned pt in supine to relieve pain both on LLE and irritation from cervical collar. Pt requesting water and this therapist provided 5cc sips intermittently throughout session, pt tolerated sips well and appreciative. Pt unwilling to have therapist perform PROM on LLE, PROM only performed on RLE. Rhythmic initiation into hip/knee flexion, ER/IR, abduction and adduction to prevent contractures and provide pressure relief. Pt requesting to end session early due to headache, therapist covered eyes w/ towel and pt ended session in supine, call bell in L hand and all needs met.    Therapy Documentation Precautions:  Precautions Precautions: Fall, Cervical Required Braces or Orthoses: Cervical Brace Cervical Brace: Hard collar, At all times Restrictions Weight Bearing Restrictions: Yes RLE Weight Bearing: Non weight bearing LLE Weight Bearing: Non weight bearing General: PT Amount of Missed Time (min): 15 Minutes PT Missed Treatment Reason: Patient fatigue;Pain Vital Signs: Therapy Vitals Pulse Rate: (!) 114 Resp: 16 Patient Position (if appropriate): Lying Oxygen Therapy SpO2: 97 % O2 Device: Tracheostomy Collar O2 Flow Rate (L/min): 5 L/min FiO2 (%): 28 %   See  Function Navigator for Current Functional Status.   Therapy/Group: Individual Therapy  [name] 05/31/2017, 2:36 PM

## 2017-05-31 NOTE — Progress Notes (Signed)
Speech Language Pathology Daily Session Note  Patient Details  Name: [name] MRN: [id number] Date of Birth: December 10, 1965  Today's Date: 05/31/2017 SLP Individual Time: 0815-0900 SLP Individual Time Calculation (min): 45 min  Short Term Goals: Week 2: SLP Short Term Goal 1 (Week 2): Pt will utilize increased vocal intensity and slow rate to achieve intelligibility at the phrase  to sentence level with min assist verbal cues.   SLP Short Term Goal 2 (Week 2): Pt will identify at least 2 new deficits and their impact on his functional independence with mod assist verbal cues.   SLP Short Term Goal 3 (Week 2): Pt will verbalize at least 2 safety precautions with mod assist verbal cues.   SLP Short Term Goal 4 (Week 2): Pt will direct self care by answering questions regarding PO intake (i.e., amount of intake, cup vs spoon) with Mod A cues.   Skilled Therapeutic Interventions: Skilled treatment session focused on dysphagia and cognition goals. SLP facilitated session by providing oral care with Total A hand over hand. SLP provided skilled observation of pt consuming thin water via spoon. Pt with cough on 1 out 15 boluses suspected d/t discoordinated breath and swallow initiation. Pt required Max A to Mod A cues to answer questions related to self-care. At the end of session, pt able to state that he could only consume thin water with therapists and by syringe - no more than 5 cc. Pt left upright in bed, PMSV off and nursing present to assist with bedpan. Continue per current plan of care.      Function:  Eating Eating   Modified Consistency Diet: No (Trials of water with SLP and therapists)             Cognition Comprehension Comprehension assist level: Understands basic 90% of the time/cues < 10% of the time  Expression Expression assistive device: Talk trach valve Expression assist level: Expresses basic 25 - 49% of the time/requires cueing 50 - 75% of the time. Uses single  words/gestures.;Expresses basic 50 - 74% of the time/requires cueing 25 - 49% of the time. Needs to repeat parts of sentences.  Social Interaction Social Interaction assist level: Interacts appropriately 75 - 89% of the time - Needs redirection for appropriate language or to initiate interaction.  Problem Solving Problem solving assist level: Solves basic 50 - 74% of the time/requires cueing 25 - 49% of the time  Memory Memory assist level: Recognizes or recalls 75 - 89% of the time/requires cueing 10 - 24% of the time    Pain Pain Assessment Pain Assessment: 0-10 Pain Score: 5  Pain Type: Acute pain Pain Location: Head Pain Orientation: Posterior Pain Descriptors / Indicators: Aching  Therapy/Group: Individual Therapy  Mariea Mcmartin 05/31/2017, 12:36 PM

## 2017-05-31 NOTE — Progress Notes (Signed)
Social Work Patient ID: [name], male   DOB: [birth year], 51 y.o.   MRN: [id number]   Met with pt's wife following team conference this week and discussed target LOS for 4 weeks and goals/  Need for 24/7 care.  Stressed to wife the need to establish a caregiver plan ASAP.  She was very quiet and a little tearful as she reports she has had other stressors going on this week.  She is very uncertain about how she will cover 24/7.  Notes she does not have a close relationship with his family and not sure if they will be willing and reliable to help pt at home but she will speak with them.  I did remind her that we can pursue SNF option if more time is needed to arrange the 24/7 but that this is expected to be a level of care he will need for long-term.  Will follow up with her next week.  Lavada Langsam, LCSW

## 2017-05-31 NOTE — Progress Notes (Signed)
Spoke with MD on cal. Family concern with severity and regularity of patient's headaches. MD stated he was aware and to continue with pain med regimen.

## 2017-05-31 NOTE — Progress Notes (Signed)
Patient is clear and says he does not need to be suctioned at this time.

## 2017-05-31 NOTE — Progress Notes (Signed)
Occupational Therapy Session Note  Patient Details  Name: [name] MRN: [id number] Date of Birth: [birth year]  Today's Date: 05/31/2017 OT Individual Time: 6295-2841 OT Individual Time Calculation (min): 30 min    Short Term Goals: Week 1:  OT Short Term Goal 1 (Week 1): Pt will engage in static sitting for 3 minutes with  mod A or less for sitting balance.  OT Short Term Goal 1 - Progress (Week 1): Progressing toward goal OT Short Term Goal 2 (Week 1): Pt verbalize NWB precautions with 100% accuracy for 3 consecutive sessions.  OT Short Term Goal 2 - Progress (Week 1): Met  Skilled Therapeutic Interventions/Progress Updates:    1:1. PSMV on during OT session O2 >95%. Pt c/o headache in the back of his head and R elbow with PROM. Pt decline OOB activity d/t HA. OT performs PROM on BUE in all planes of motion to decrease risk of contracture. Pt with trace elbow and digit flexion in LUE. OT provides MAX AAROM of digit flexion/extension and elbow flex/extension to move through full ROM. Pt reporting told MD, "Im doing too much in therapy." OT educates on importance of participating in therapy to improve independence and function as well as decrease burden of care. Pt says "I cant do any more. I need to rest." Pt missed 30 min skilled OT 2/2 fatigue and HA.   Therapy Documentation Precautions:  Precautions Precautions: Fall, Cervical Required Braces or Orthoses: Cervical Brace Cervical Brace: Hard collar, At all times Restrictions Weight Bearing Restrictions: Yes RLE Weight Bearing: Non weight bearing LLE Weight Bearing: Non weight bearing General:   Vital Signs: Therapy Vitals Pulse Rate: (!) 110 Resp: 16 Patient Position (if appropriate): Lying Oxygen Therapy SpO2: 98 % O2 Device: Tracheostomy Collar O2 Flow Rate (L/min): 5 L/min FiO2 (%): 28 % Pain: Pain Assessment Pain Assessment: 0-10 Pain Score: 5  Pain Type: Acute pain Pain Location: Head Pain Orientation:  Posterior Pain Descriptors / Indicators: Aching  See Function Navigator for Current Functional Status.   Therapy/Group: Individual Therapy  Tonny Branch 05/31/2017, 9:46 AM

## 2017-05-31 NOTE — Progress Notes (Signed)
Occupational Therapy Session Note  Patient Details  Name: Lumir Demetriou MRN: [id number] Date of Birth: [birth year]  Today's Date: 05/31/2017 OT Individual Time: 1300-1330 OT Individual Time Calculation (min): 30 min    Short Term Goals: Week 2:  OT Short Term Goal 1 (Week 2): Pt will engage in static sitting for 3 minutes with  mod A or less for sitting balance.  OT Short Term Goal 2 (Week 2): Pt will direct care for bed mobility in preparation for supine>sit EOB with mod quesitoning verbal cues OT Short Term Goal 3 (Week 2): Pt will recall correct placement of slide board with mod verbal cues  Skilled Therapeutic Interventions/Progress Updates:    1:1. Pt tilted back in TIS sleeping upon arrival. Pt reporting fatigue but HA has subsided. Pt willing to participate in tx. OT transitioned pt to sitting upright in stages as pt reports feeling slight dizziness with transition. Pt BP and O2 WNL throughout session. Pt sits in TIS at high low table with elbows supported and completes 2x8 elbow flexion/extension towel glides with MAX A on LUE to achieve full ROM and TOTAL A on RUE with trace muscle activation. In LUE pt able to move hand across table in small bursts but unable to complete smooth gross movement of elbow flexion/extension. Pt requires 2 tilted back breaks as pt reports sitting upright hurts across his chest but is relieved with change of position. Exited session with pt tilted in TIS, call light in reach, PMSV doffed and NT in room.  Therapy Documentation Precautions:  Precautions Precautions: Fall, Cervical Required Braces or Orthoses: Cervical Brace Cervical Brace: Hard collar, At all times Restrictions Weight Bearing Restrictions: Yes RLE Weight Bearing: Non weight bearing LLE Weight Bearing: Non weight bearing  See Function Navigator for Current Functional Status.   Therapy/Group: Individual Therapy  Tonny Branch 05/31/2017, 4:22 PM

## 2017-05-31 NOTE — Progress Notes (Signed)
Physical Therapy Session Note  Patient Details  Name: [name] MRN: [id number] Date of Birth: [birth year]  Today's Date: 05/31/2017 PT Individual Time: 1100-1200 PT Individual Time Calculation (min): 60 min   Short Term Goals: Week 2:  PT Short Term Goal 1 (Week 2): Pt will demonstrate rolling R/L maxA +1 PT Short Term Goal 2 (Week 2): Pt will demonstrate supine>sit maxA +1 PT Short Term Goal 3 (Week 2): Pt will demonstrate dynamic sitting balance x5 min on mat table with minA  Skilled Therapeutic Interventions/Progress Updates:   Focused on functional bed mobility for rolling for donning shorts with focus on pt assisting with activation of BLE during mobility and directing care. Total +2 for supine -> sit with HOB elevated and max assist to maintain balance EOB while PT assisted with donning shirt. Engaged in [place] transfer (maintaining BLE elevated on stool to maintain NWB) with focus on trunk control as able and +2 assist needed. +2 to reposition in w/c and perform reciprocal scooting. With BLE, focused on NMR for BLE motor activation for active assisted hip abduction/adduction and rotation to address positioning (currenty using theraband around CAM boots to maintain neutral alignment). More active movement noted in RLE than LLE. End of session set up in w/c with soft touch call bell in reach and NT in room.   Therapy Documentation Precautions:  Precautions Precautions: Fall, Cervical Required Braces or Orthoses: Cervical Brace Cervical Brace: Hard collar, At all times Restrictions Weight Bearing Restrictions: Yes RLE Weight Bearing: Non weight bearing LLE Weight Bearing: Non weight bearing  Pain: Reports headache improved from earlier but still present - unrated.    See Function Navigator for Current Functional Status.   Therapy/Group: Individual Therapy  Canary Brim Ivory Broad, PT, DPT  05/31/2017, 12:23 PM

## 2017-06-01 ENCOUNTER — Inpatient Hospital Stay (HOSPITAL_COMMUNITY): Payer: Self-pay | Admitting: Speech Pathology

## 2017-06-01 ENCOUNTER — Inpatient Hospital Stay (HOSPITAL_COMMUNITY): Payer: BC Managed Care – PPO

## 2017-06-01 ENCOUNTER — Inpatient Hospital Stay (HOSPITAL_COMMUNITY): Payer: Self-pay

## 2017-06-01 DIAGNOSIS — A419 Sepsis, unspecified organism: Secondary | ICD-10-CM

## 2017-06-01 LAB — URINALYSIS, ROUTINE W REFLEX MICROSCOPIC
Bilirubin Urine: NEGATIVE
GLUCOSE, UA: NEGATIVE mg/dL
HGB URINE DIPSTICK: NEGATIVE
KETONES UR: NEGATIVE mg/dL
Nitrite: NEGATIVE
PROTEIN: 30 mg/dL — AB
Specific Gravity, Urine: 1.029 (ref 1.005–1.030)
pH: 5 (ref 5.0–8.0)

## 2017-06-01 LAB — CBC
HCT: 33.9 % — ABNORMAL LOW (ref 39.0–52.0)
Hemoglobin: 10.8 g/dL — ABNORMAL LOW (ref 13.0–17.0)
MCH: 27.6 pg (ref 26.0–34.0)
MCHC: 31.9 g/dL (ref 30.0–36.0)
MCV: 86.5 fL (ref 78.0–100.0)
PLATELETS: 290 10*3/uL (ref 150–400)
RBC: 3.92 MIL/uL — AB (ref 4.22–5.81)
RDW: 15.9 % — AB (ref 11.5–15.5)
WBC: 16.6 10*3/uL — AB (ref 4.0–10.5)

## 2017-06-01 LAB — GLUCOSE, CAPILLARY
GLUCOSE-CAPILLARY: 126 mg/dL — AB (ref 65–99)
GLUCOSE-CAPILLARY: 128 mg/dL — AB (ref 65–99)
GLUCOSE-CAPILLARY: 115 mg/dL — AB (ref 65–99)
Glucose-Capillary: 123 mg/dL — ABNORMAL HIGH (ref 65–99)
Glucose-Capillary: 133 mg/dL — ABNORMAL HIGH (ref 65–99)
Glucose-Capillary: 136 mg/dL — ABNORMAL HIGH (ref 65–99)
Glucose-Capillary: 146 mg/dL — ABNORMAL HIGH (ref 65–99)

## 2017-06-01 LAB — BASIC METABOLIC PANEL
ANION GAP: 11 (ref 5–15)
BUN: 20 mg/dL (ref 6–20)
CALCIUM: 9.3 mg/dL (ref 8.9–10.3)
CO2: 25 mmol/L (ref 22–32)
Chloride: 97 mmol/L — ABNORMAL LOW (ref 101–111)
Creatinine, Ser: 0.6 mg/dL — ABNORMAL LOW (ref 0.61–1.24)
GFR calc Af Amer: >60 mL/min (ref >60)
GFR calc non Af Amer: >60 mL/min (ref >60)
GLUCOSE: 159 mg/dL — AB (ref 65–99)
Potassium: 3.9 mmol/L (ref 3.5–5.1)
Sodium: 133 mmol/L — ABNORMAL LOW (ref 135–145)

## 2017-06-01 MED ORDER — PIPERACILLIN-TAZOBACTAM 3.375 G IVPB 30 MIN
3.3750 g | Freq: Once | INTRAVENOUS | Status: AC
  Administered 2017-06-01: 3.375 g via INTRAVENOUS
  Filled 2017-06-01: qty 50

## 2017-06-01 MED ORDER — PIPERACILLIN-TAZOBACTAM 3.375 G IVPB
3.3750 g | Freq: Three times a day (TID) | INTRAVENOUS | Status: DC
  Administered 2017-06-01 – 2017-06-03 (×7): 3.375 g via INTRAVENOUS
  Filled 2017-06-01 (×10): qty 50

## 2017-06-01 MED ORDER — SODIUM CHLORIDE 0.45 % IV SOLN
INTRAVENOUS | Status: DC
  Administered 2017-06-01 – 2017-06-02 (×2): via INTRAVENOUS

## 2017-06-01 MED ORDER — VANCOMYCIN HCL IN DEXTROSE 1-5 GM/200ML-% IV SOLN
1000.0000 mg | Freq: Three times a day (TID) | INTRAVENOUS | Status: DC
  Administered 2017-06-01 – 2017-06-03 (×6): 1000 mg via INTRAVENOUS
  Filled 2017-06-01 (×8): qty 200

## 2017-06-01 MED ORDER — VANCOMYCIN HCL 10 G IV SOLR
1500.0000 mg | Freq: Once | INTRAVENOUS | Status: AC
  Administered 2017-06-01: 1500 mg via INTRAVENOUS
  Filled 2017-06-01: qty 1500

## 2017-06-01 NOTE — Progress Notes (Signed)
[place] PHYSICAL MEDICINE & REHABILITATION     PROGRESS NOTE    Subjective/Complaints: Called at 0230 re: 102 degree fever and tachycardia. Pt "not feeling well" ---was sleeping when I arrived in room. More comfortable than overnight  ROS: Limited due to cognitive/behavioral   Objective: Vital Signs: Blood pressure 134/75, pulse 96, temperature 100.3 F (37.9 C), temperature source Oral, resp. rate 18, height 5\' 6"  (1.676 m), weight 89.4 kg (197 lb 1.5 oz), SpO2 97 %. Dg Chest Port 1 View  Result Date: 06/01/2017 CLINICAL DATA:  Diaphoretic, congestion EXAM: PORTABLE CHEST 1 VIEW COMPARISON:  05/29/2017 FINDINGS: Tracheostomy tube is in place, the tip is about 3.2 cm superior to the carina. Low lung volumes with slight increased left basilar opacity. Borderline cardiomegaly. No pneumothorax IMPRESSION: Increasing atelectasis versus mild infiltrate at the left lung base. Electronically Signed   By: Donavan Foil M.D.   On: 06/01/2017 03:58    Recent Labs  06/01/17 0755  WBC 16.6*  HGB 10.8*  HCT 33.9*  PLT 290    Recent Labs  06/01/17 0345  NA 133*  K 3.9  CL 97*  GLUCOSE 159*  BUN 20  CREATININE 0.60*  CALCIUM 9.3   CBG (last 3)   Recent Labs  05/31/17 2008 06/01/17 0029 06/01/17 0418  GLUCAP 151* 146* 136*    Wt Readings from Last 3 Encounters:  05/31/17 89.4 kg (197 lb 1.5 oz)  05/21/17 99 kg (218 lb 3.2 oz)  01/11/17 91.6 kg (202 lb)    Physical Exam:  Constitutional: He appears well-developedand well-nourished. He is in no distress.  HENT:  Head: Normocephalicand atraumatic.  Jaws wired.  Eyes:  Unable to move right eye laterally. Left eye injected and limited to lateral fields---able to see through left eye Neck:  Immobilized by Aspen collar.head more neutral.  Cardiovascular:. Tachy in upper 90's.     Respiratory: decreased sounds at bases. Fair air movement overall. No rhonchi   GI: Soft. NT, PEG site in place.  Genitourinary:   Genitourinary Comments: foley  Musculoskeletal: He exhibits continued edemaand pain, bruising left knee.  Min edema left thigh with surgical dressing on left hip. Left ankle splinted with ACE. CAM boot in place on right foot.  Neurological: somnolent. Does awaken easy and follow simple commands.   Motor 1-2/5 UE and LE's,   inconsistent, braces/splint in place. Does sense deep pain stim. Left III, VI nerve weakness but seems to have some movement. Right VI deficit. .Oriented to person and place.   Skin: Skin is warmand dry.  Scattered wounds/dry Psychiatric:  Fairly pleasant and cooperative  Assessment/Plan: 1. Functional deficits, tetraplegia secondary to TBI/cervical SCI which require 3+ hours per day of interdisciplinary therapy in a comprehensive inpatient rehab setting. Physiatrist is providing close team supervision and 24 hour management of active medical problems listed below. Physiatrist and rehab team continue to assess barriers to discharge/monitor patient progress toward functional and medical goals.  Function:  Bathing Bathing position      Bathing parts   Body parts bathed by helper: Left arm, Right arm, Chest, Abdomen, Front perineal area, Buttocks, Right upper leg, Left upper leg, Right lower leg, Left lower leg  Bathing assist Assist Level: 2 helpers      Upper Body Dressing/Undressing Upper body dressing                    Upper body assist Assist Level:  (total A)      Lower Body Dressing/Undressing  Lower body dressing   What is the patient wearing?: [facility] G[facility]                              Lower body assist Assist for lower body dressing: 2 Helpers      Toileting Toileting Toileting activity did not occur: No continent bowel/bladder event   Toileting steps completed by helper: Adjust clothing prior to toileting, Performs perineal hygiene, Adjust clothing after toileting (per [place], NT report)    Toileting assist Assist  level: Two helpers (per [place], [place] report)   Transfers Chair/bed transfer   Chair/bed transfer method: Lateral scoot Chair/bed transfer assist level: 2 helpers Chair/bed transfer assistive device: Sliding board, Orthosis Mechanical lift: Maximove   Locomotion Ambulation Ambulation activity did not occur: Safety/medical Editor, commissioning activity did not occur: Safety/medical concerns Type: Manual Max wheelchair distance: 300+ ft Assist Level: Dependent (Pt equals 0%)  Cognition Comprehension Comprehension assist level: Understands basic 90% of the time/cues < 10% of the time  Expression Expression assist level: Expresses basic 25 - 49% of the time/requires cueing 50 - 75% of the time. Uses single words/gestures., Expresses basic 50 - 74% of the time/requires cueing 25 - 49% of the time. Needs to repeat parts of sentences.  Social Interaction Social Interaction assist level: Interacts appropriately 75 - 89% of the time - Needs redirection for appropriate language or to initiate interaction.  Problem Solving Problem solving assist level: Solves basic 50 - 74% of the time/requires cueing 25 - 49% of the time  Memory Memory assist level: Recognizes or recalls 75 - 89% of the time/requires cueing 10 - 24% of the time   Medical Problem List and Plan: 1. Functional, cognitive and mobility deficitssecondary to Moderate TBI, C4 spinal cord injury -CIR PT, OT, speech---continue as tolerated    -spoke to wife today regarding his overall state.  2. DVT Prophylaxis/Anticoagulation: Pharmaceutical: Lovenox 3. Chronic neck pain/Pain Management: On Gabapentin bid   -changed hydrocodone to oxycodone for breakthrough pain  - fentanyl patch 63mcg/hr with improved pain  -educated wife regarding pain, sources, effects of pain meds, etc  -left knee pain, likely soft tissue, ligamentous. Wrap/support as able   -outpt follow up  -scheduled robaxin for  cervicalgia/dystonia, cervical rom/collar  -Headaches are likely primarly related to occipita-cervical injury 4. Mood: LCSW to follow for evaluation and support when appropriate.  5. Neuropsych: This patient is notcapable of making decisions on hisown behalf. 6. Skin/Wound Care: Air mattress overlay. Maintain adequate nutritional and hydration status.  7. Fluids/Electrolytes/Nutrition: Monitor I/O.   -continue TF   -would prefer that po be administered by SLP only at this point 9. Left periorbital cellulitis/ CN III/VI palsy: 10Left femur fracture s/p IM nail: NWB X 8 weeks. 11. Left open tibial fracture s/p I and D with ORIF: NWB--convert to CAM boot? Follow up with ortho.  11. Left mandibular fracture s/p CR /tongue laceration: Downsized to CFS # 4 on 9/17 -To leave wires in place for 4 weeks from 8/29?---9/26 12. Chemosis left eye: Continue Lacrilube every 3 hours till resolved per opthalmology.  13. Facial cellulitis: Keflex abx day 14 of 14 14. Leucocytosis: Monitor for signs of infection. 15. Thrombocytosis: Likely reactive. Dopplers pending.   16. ABLA: hgb stable at 10.5 17. Abnormal LFTs: Question due to shocked liver.  68. H/o of MDD: Was on Trintellix 5 mg and Risperidone 5 mg  bid at home--continue to hold for now.   -team follow. Neuro-psych has seen patient and is following 19. Urine retention/neurogenic bladder: foley replaced and left in place due to risk of skin breakdown and pt's poor mobility 20. ID/septicemia:  -Left lower lobe infiltrate on CXR, wbc's 16k  -urine suspicious as well  -vanc/zosyn initiated---appreciate pharmacy assistance  -continue IVF  -blood cultures pending  -continue IVF   LOS (Days) 11 A FACE TO FACE EVALUATION WAS PERFORMED  Meredith Staggers, MD 06/01/2017 9:24 AM

## 2017-06-01 NOTE — Progress Notes (Signed)
Called by RNs to evaluate patient for fever. Patient spike a temp > 101.0, I instructed RNs to obtained rectal temp if possible and call Primary MD.  RN had already given APAP for fever and oxy for headaches.   I was with two different patients at this time, I arrived at 10, RN had received orders for IV fluids, IV antibiotics, blood and would cultures, chest xray, UA/UC.   Patient was alert, pleasant, wife at the bedside, lung sounds clear in the upper and diminished in the lowers, good air movement, no acute respiratory distress on 5L 28% on trach collar.  Skin is warm, + beads of sweat on the patient's face.  Patient stated his headache has eased.   I attemped IV x 2, IV TEAM RN inserted IV. Labs collected, chest xray: increasing atelectasis versus mild infiltrate at the left lung base.  BP stable and HR has been in the low 100s. Repeat oral temp was 100.3, I did apply cold washcloths to patient's face/neck/arms as well for comfort.    Call Time 0151 Start Time 245 End Time 0626  Will follow as needed
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
## 2017-06-01 NOTE — Progress Notes (Signed)
Speech Language Pathology Daily Session Note  Patient Details  Name: [name] MRN: [id number] Date of Birth: [birth year]  Today's Date: 06/01/2017 SLP Individual Time: 1530-1600 SLP Individual Time Calculation (min): 30 min  Short Term Goals: Week 2: SLP Short Term Goal 1 (Week 2): Pt will utilize increased vocal intensity and slow rate to achieve intelligibility at the phrase  to sentence level with min assist verbal cues.   SLP Short Term Goal 2 (Week 2): Pt will identify at least 2 new deficits and their impact on his functional independence with mod assist verbal cues.   SLP Short Term Goal 3 (Week 2): Pt will verbalize at least 2 safety precautions with mod assist verbal cues.   SLP Short Term Goal 4 (Week 2): Pt will direct self care by answering questions regarding PO intake (i.e., amount of intake, cup vs spoon) with Mod A cues.   Skilled Therapeutic Interventions: Pt was seen for skilled ST targeting dysphagia and communication goals.  Pt was awake and brighter for today's therapy session and had multiple family members present who were very supportive and encouraging during today's session.  Pt's voice subjectively sounded stronger today and pt needed min-mod assist verbal cues for increased vocal intensity to achieve intelligibility at the phrase level.  Pt was wearing PMSV upon therapist's arrival with good toleration as evidenced by all vital signs remaining [facility] for duration of today's session.  Pt consumed ~ten 5 cc boluses of water via syringe following oral care with only x1 instance of coughing.  Vocal quality remained clear following trials.  Pt was left in wheelchair with family at bedside.  Continue per current plan of care.    Function:  Eating Eating   Modified Consistency Diet: Yes (trials of thin liquids via syringe) Eating Assist Level: Helper feeds patient           Cognition Comprehension Comprehension assist level: Follows basic  conversation/direction with extra time/assistive device  Expression Expression assistive device: Talk trach valve Expression assist level: Expresses basic 50 - 74% of the time/requires cueing 25 - 49% of the time. Needs to repeat parts of sentences.  Social Interaction Social Interaction assist level: Interacts appropriately 75 - 89% of the time - Needs redirection for appropriate language or to initiate interaction.  Problem Solving Problem solving assist level: Solves basic 50 - 74% of the time/requires cueing 25 - 49% of the time  Memory Memory assist level: Recognizes or recalls 75 - 89% of the time/requires cueing 10 - 24% of the time    Pain Pain Assessment Pain Assessment: 0-10 Pain Score: 4  Pain Type: Acute pain Pain Location: Head Pain Descriptors / Indicators: Aching Pain Intervention(s): RN made aware  Therapy/Group: Individual Therapy  Jenell Dobransky, Selinda Orion 06/01/2017, 4:20 PM

## 2017-06-01 NOTE — Progress Notes (Signed)
Occupational Therapy Session Note  Patient Details  Name: Laverne Klugh MRN: [id number] Date of Birth: 1966-07-24  Today's Date: 06/01/2017 OT Individual Time: 8295-6213 OT Individual Time Calculation (min): 59 min    Skilled Therapeutic Interventions/Progress Updates:    1:1. Pt sister, mother and daughter present during tx. OT apply PMSV during tx and O2 remained WNL. Pt requires TOTAL A +2 supine>EOB. With feet elevated on chair to maintain BLE NWB precautions, pt slide board transfer TOTAL A +2 w/c>EOB and w/c<>EOM with VC for anterior weight shift. While seated EOM with +2 seated on ball behind back to maintain sitting balance/facilitate normal pelvic tilt. OT places UE on ball and pt moves ball forwards and backwards with anterior/posterior weight shift and flexion/extension of elbows. Pt with MOD A to extend L arm and MAX A to extend R arm. Pt able to complete 3x10 on BUE. Exited session with pt seated in TIS, soft touch call light in hand, family present and daughter trained on PSMV and to remove when leaving.   Therapy Documentation Precautions:  Precautions Precautions: Fall, Cervical Required Braces or Orthoses: Cervical Brace Cervical Brace: Hard collar, At all times Restrictions Weight Bearing Restrictions: Yes RLE Weight Bearing: Non weight bearing LLE Weight Bearing: Non weight bearing  See Function Navigator for Current Functional Status.   Therapy/Group: Individual Therapy  Tonny Branch 06/01/2017, 5:07 PM

## 2017-06-01 NOTE — Progress Notes (Signed)
Called MD on call R/T high temp and increased pulse. N/O's received and implemented.

## 2017-06-02 ENCOUNTER — Inpatient Hospital Stay (HOSPITAL_COMMUNITY): Payer: BC Managed Care – PPO

## 2017-06-02 LAB — GLUCOSE, CAPILLARY
GLUCOSE-CAPILLARY: 111 mg/dL — AB (ref 65–99)
GLUCOSE-CAPILLARY: 161 mg/dL — AB (ref 65–99)
Glucose-Capillary: 132 mg/dL — ABNORMAL HIGH (ref 65–99)
Glucose-Capillary: 139 mg/dL — ABNORMAL HIGH (ref 65–99)
Glucose-Capillary: 143 mg/dL — ABNORMAL HIGH (ref 65–99)
Glucose-Capillary: 97 mg/dL (ref 65–99)

## 2017-06-02 NOTE — Progress Notes (Signed)
[place] PHYSICAL MEDICINE & REHABILITATION     PROGRESS NOTE    Subjective/Complaints: Quieter night. Still having headache (posterior). No fever  ROS: Limited due to cognitive/behavioral   Objective: Vital Signs: Blood pressure 125/72, pulse 89, temperature 97.9 F (36.6 C), temperature source Axillary, resp. rate 16, height 5\' 6"  (1.676 m), weight 90.7 kg (200 lb), SpO2 99 %. Dg Chest Port 1 View  Result Date: 06/01/2017 CLINICAL DATA:  Diaphoretic, congestion EXAM: PORTABLE CHEST 1 VIEW COMPARISON:  05/29/2017 FINDINGS: Tracheostomy tube is in place, the tip is about 3.2 cm superior to the carina. Low lung volumes with slight increased left basilar opacity. Borderline cardiomegaly. No pneumothorax IMPRESSION: Increasing atelectasis versus mild infiltrate at the left lung base. Electronically Signed   By: Donavan Foil M.D.   On: 06/01/2017 03:58    Recent Labs  06/01/17 0755  WBC 16.6*  HGB 10.8*  HCT 33.9*  PLT 290    Recent Labs  06/01/17 0345  NA 133*  K 3.9  CL 97*  GLUCOSE 159*  BUN 20  CREATININE 0.60*  CALCIUM 9.3   CBG (last 3)   Recent Labs  06/01/17 1958 06/01/17 2344 06/02/17 0320  GLUCAP 115* 133* 132*    Wt Readings from Last 3 Encounters:  06/02/17 90.7 kg (200 lb)  05/21/17 99 kg (218 lb 3.2 oz)  01/11/17 91.6 kg (202 lb)    Physical Exam:  Constitutional: He appears well-developedand well-nourished. He is in no distress.  HENT:  Head: Normocephalicand atraumatic.  Jaws wired.  Eyes:  Unable to move right eye laterally. Left eye injected and limited to lateral fields---able to see through left eye--no change Neck:  Immobilized by Aspen collar.head more neutral.  Cardiovascular:. Tachy in   90's.     Respiratory: no rhonchi. No distress. Modest air movement GI: Soft. NT, PEG site in place.  Genitourinary:  Genitourinary Comments: foley  Musculoskeletal: He exhibits continued  bruising left knee.  Min edema left thigh with  surgical dressing on left hip. Left ankle splinted with ACE. CAM boot in place on right foot.  Neurological: somnolent. Does awaken easy and follow simple commands.   Motor 1-2/5 UE and LE's,   inconsistent, braces/splint in place. Does sense deep pain stim. Left III, VI nerve weakness but seems to have some movement. Right VI deficit. .Oriented to person and place.   Skin: Skin is warmand dry.  Scattered wounds/dry Psychiatric:  flat Assessment/Plan: 1. Functional deficits, tetraplegia secondary to TBI/cervical SCI which require 3+ hours per day of interdisciplinary therapy in a comprehensive inpatient rehab setting. Physiatrist is providing close team supervision and 24 hour management of active medical problems listed below. Physiatrist and rehab team continue to assess barriers to discharge/monitor patient progress toward functional and medical goals.  Function:  Bathing Bathing position      Bathing parts   Body parts bathed by helper: Left arm, Right arm, Chest, Abdomen, Front perineal area, Buttocks, Right upper leg, Left upper leg, Right lower leg, Left lower leg  Bathing assist Assist Level: 2 helpers      Upper Body Dressing/Undressing Upper body dressing                    Upper body assist Assist Level:  (total A)      Lower Body Dressing/Undressing Lower body dressing   What is the patient wearing?: [facility]  Lower body assist Assist for lower body dressing: 2 Helpers      Toileting Toileting Toileting activity did not occur: No continent bowel/bladder event   Toileting steps completed by helper: Adjust clothing prior to toileting, Performs perineal hygiene, Adjust clothing after toileting (per [place], [place] report)    Toileting assist Assist level: Two helpers (per [place], [place] report)   Transfers Chair/bed transfer   Chair/bed transfer method: Lateral scoot Chair/bed transfer assist level: 2  helpers Chair/bed transfer assistive device: Sliding board, Orthosis Mechanical lift: Maximove   Locomotion Ambulation Ambulation activity did not occur: Safety/medical Editor, commissioning activity did not occur: Safety/medical concerns Type: Manual Max wheelchair distance: 300+ ft Assist Level: Dependent (Pt equals 0%)  Cognition Comprehension Comprehension assist level: Understands basic 25 - 49% of the time/ requires cueing 50 - 75% of the time, Understands basic 90% of the time/cues < 10% of the time  Expression Expression assist level: Expresses basic 50 - 74% of the time/requires cueing 25 - 49% of the time. Needs to repeat parts of sentences.  Social Interaction Social Interaction assist level: Interacts appropriately 50 - 74% of the time - May be physically or verbally inappropriate.  Problem Solving Problem solving assist level: Solves basic 50 - 74% of the time/requires cueing 25 - 49% of the time  Memory Memory assist level: Recognizes or recalls 75 - 89% of the time/requires cueing 10 - 24% of the time   Medical Problem List and Plan: 1. Functional, cognitive and mobility deficitssecondary to Moderate TBI, C4 spinal cord injury -CIR PT, OT, speech---continue as tolerated 2. DVT Prophylaxis/Anticoagulation: Pharmaceutical: Lovenox 3. Chronic neck pain/Pain Management: On Gabapentin bid   -changed hydrocodone to oxycodone for breakthrough pain  - fentanyl patch 7mcg/hr with improved pain   -need to be careful given cognitive/respiratory supression  -daily education for patient and family  -left knee pain, likely soft tissue, ligamentous. Wrap/support as able   -outpt follow up  -scheduled robaxin for cervicalgia/dystonia, cervical rom/collar  -Headaches are likely primarly related to occipita-cervical injury 4. Mood: LCSW to follow for evaluation and support when appropriate.  5. Neuropsych: This patient is notcapable of making  decisions on hisown behalf. 6. Skin/Wound Care: Air mattress overlay. Maintain adequate nutritional and hydration status.  7. Fluids/Electrolytes/Nutrition: Monitor I/O.   -continue TF   -would prefer that po be administered by SLP only at this point 9. Left periorbital cellulitis/ CN III/VI palsy: 10Left femur fracture s/p IM nail: NWB X 8 weeks. 11. Left open tibial fracture s/p I and D with ORIF: NWB--convert to CAM boot? Follow up with ortho.  11. Left mandibular fracture s/p CR /tongue laceration: Downsized to CFS # 4 on 9/17 -To leave wires in place for 4 weeks from 8/29?---9/26 12. Chemosis left eye: Continue Lacrilube every 3 hours till resolved per opthalmology.  13. Facial cellulitis: Keflex abx day 14 of 14 14. Leucocytosis: Monitor for signs of infection. 15. Thrombocytosis: Likely reactive. Dopplers pending.   16. ABLA: hgb stable at 10.5 17. Abnormal LFTs: Question due to shocked liver.  76. H/o of MDD: Was on Trintellix 5 mg and Risperidone 5 mg bid at home--continue to hold for now.   -team follow. Neuro-psych has seen patient and is following 19. Urine retention/neurogenic bladder: foley replaced and left in place due to risk of skin breakdown and pt's poor mobility 20. ID/septicemia:  -Left lower lobe infiltrate on CXR, wbc's 16k on 9/22---recheck tomorrow  -  ua +, ucx pending  -vanc/zosyn day 2--appreciate pharmacy assistance  -continue IVF  -blood cultures pending  -continue IVF   LOS (Days) 12 A FACE TO FACE EVALUATION WAS PERFORMED  Alger Simons T, MD 06/02/2017 8:59 AM

## 2017-06-02 NOTE — Progress Notes (Signed)
Physical Therapy Note  Patient Details  Name: [name] MRN: [id number] Date of Birth: 10-Nov-1965 Today's Date: 06/02/2017  [id number], 60 min individual tx Pain: 8/10, HA, L shoulder cervical area, L knee; meds administered during session, and cryotherapy to L shoulder and L knee during session  PMSV applied.  Pt declined getting out of bed. "I feel terribel and hurt all over" stated by this pt who is generally motivated and wants to get OOB. Tx focused on gentle grade I R hip mobilizations for pain relief and incraseing flexbility; PROM R hip, knee.  activie assistve 2 x 10 R knee short arc quad, R heel slides, R hip abduction/adduction, extension against min resistance, bil glut sets.  Pt left resting in bed with bil boots on LEs, call bell in L hand.  PMSV removed.  See function navigator for current status.  Jhase Creppel 06/02/2017, 8:15 AM

## 2017-06-03 ENCOUNTER — Inpatient Hospital Stay (HOSPITAL_COMMUNITY): Payer: BC Managed Care – PPO | Admitting: Speech Pathology

## 2017-06-03 ENCOUNTER — Inpatient Hospital Stay (HOSPITAL_COMMUNITY): Payer: Self-pay | Admitting: Physical Therapy

## 2017-06-03 ENCOUNTER — Inpatient Hospital Stay (HOSPITAL_COMMUNITY): Payer: BC Managed Care – PPO | Admitting: Occupational Therapy

## 2017-06-03 DIAGNOSIS — N390 Urinary tract infection, site not specified: Secondary | ICD-10-CM

## 2017-06-03 DIAGNOSIS — J690 Pneumonitis due to inhalation of food and vomit: Secondary | ICD-10-CM

## 2017-06-03 DIAGNOSIS — A499 Bacterial infection, unspecified: Secondary | ICD-10-CM

## 2017-06-03 LAB — CBC
HCT: 31.2 % — ABNORMAL LOW (ref 39.0–52.0)
Hemoglobin: 9.8 g/dL — ABNORMAL LOW (ref 13.0–17.0)
MCH: 26.5 pg (ref 26.0–34.0)
MCHC: 31.4 g/dL (ref 30.0–36.0)
MCV: 84.3 fL (ref 78.0–100.0)
PLATELETS: 218 10*3/uL (ref 150–400)
RBC: 3.7 MIL/uL — AB (ref 4.22–5.81)
RDW: 15.3 % (ref 11.5–15.5)
WBC: 4.6 10*3/uL (ref 4.0–10.5)

## 2017-06-03 LAB — GLUCOSE, CAPILLARY
GLUCOSE-CAPILLARY: 148 mg/dL — AB (ref 65–99)
GLUCOSE-CAPILLARY: 122 mg/dL — AB (ref 65–99)
GLUCOSE-CAPILLARY: 142 mg/dL — AB (ref 65–99)
Glucose-Capillary: 149 mg/dL — ABNORMAL HIGH (ref 65–99)
Glucose-Capillary: 136 mg/dL — ABNORMAL HIGH (ref 65–99)

## 2017-06-03 LAB — URINE CULTURE: Culture: >=100000 — AB

## 2017-06-03 LAB — BASIC METABOLIC PANEL
Anion gap: 7 (ref 5–15)
BUN: 15 mg/dL (ref 6–20)
CALCIUM: 9.1 mg/dL (ref 8.9–10.3)
CO2: 29 mmol/L (ref 22–32)
CREATININE: 0.54 mg/dL — AB (ref 0.61–1.24)
Chloride: 96 mmol/L — ABNORMAL LOW (ref 101–111)
GFR calc non Af Amer: >60 mL/min (ref >60)
GLUCOSE: 168 mg/dL — AB (ref 65–99)
Potassium: 3.6 mmol/L (ref 3.5–5.1)
Sodium: 132 mmol/L — ABNORMAL LOW (ref 135–145)

## 2017-06-03 LAB — VANCOMYCIN, TROUGH: VANCOMYCIN TR: 16 ug/mL (ref 15–20)

## 2017-06-03 MED ORDER — DEXTROSE 5 % IV SOLN
2.0000 g | INTRAVENOUS | Status: DC
  Administered 2017-06-03 – 2017-06-04 (×2): 2 g via INTRAVENOUS
  Filled 2017-06-03 (×3): qty 2

## 2017-06-03 MED ORDER — VANCOMYCIN HCL IN DEXTROSE 1-5 GM/200ML-% IV SOLN
1000.0000 mg | Freq: Three times a day (TID) | INTRAVENOUS | Status: DC
  Administered 2017-06-03 – 2017-06-04 (×3): 1000 mg via INTRAVENOUS
  Filled 2017-06-03 (×5): qty 200

## 2017-06-03 MED ORDER — JEVITY 1.2 CAL PO LIQD
ORAL | Status: AC
  Filled 2017-06-03: qty 237

## 2017-06-03 NOTE — Progress Notes (Signed)
[place] PHYSICAL MEDICINE & REHABILITATION     PROGRESS NOTE    Subjective/Complaints: Complains of ongoing headache. Eyes are sensitive to light.   ROS: Limited due to cognitive/behavioral   Objective: Vital Signs: Blood pressure 129/82, pulse 98, temperature 98.6 F (37 C), temperature source Axillary, resp. rate 18, height 5\' 6"  (1.676 m), weight 90.7 kg (199 lb 14.4 oz), SpO2 98 %. No results found.  Recent Labs  06/01/17 0755 06/03/17 0641  WBC 16.6* 4.6  HGB 10.8* 9.8*  HCT 33.9* 31.2*  PLT 290 218    Recent Labs  06/01/17 0345 06/03/17 0641  NA 133* 132*  K 3.9 3.6  CL 97* 96*  GLUCOSE 159* 168*  BUN 20 15  CREATININE 0.60* 0.54*  CALCIUM 9.3 9.1   CBG (last 3)   Recent Labs  06/02/17 2344 06/03/17 0406 06/03/17 0836  GLUCAP 161* 148* 122*    Wt Readings from Last 3 Encounters:  06/03/17 90.7 kg (199 lb 14.4 oz)  05/21/17 99 kg (218 lb 3.2 oz)  01/11/17 91.6 kg (202 lb)    Physical Exam:  Constitutional: He appears well-developedand well-nourished. He is in no distress.  HENT:  Head: Normocephalicand atraumatic.  Jaws wired.  Eyes:  Unable to move right eye laterally. Left eye injected and limited to lateral fields---able to see through left eye--no change Neck:  Immobilized by Aspen collar.head more neutral.  Cardiovascular:. RRR without murmur. No JVD      Respiratory: no distress. Minimal rhonchi GI: Soft. NT, PEG site in place.  Genitourinary:  Genitourinary Comments: foley  Musculoskeletal: He exhibits continued  bruising left knee wihtout change Min edema left thigh with surgical dressing on left hip. Left ankle splinted with ACE. CAM boot in place on right foot.  Neurological: somnolent. Does awaken easy and follow simple commands.   Motor 1-2/5 UE and LE's, inconsistent.   Senses pain stim. Left III, VI nerve weakness but seems to have some movement. Right VI deficit. .Oriented to person and place.   Skin: Skin is  warmand dry.  Scattered wounds/dry Psychiatric:  flat Assessment/Plan: 1. Functional deficits, tetraplegia secondary to TBI/cervical SCI which require 3+ hours per day of interdisciplinary therapy in a comprehensive inpatient rehab setting. Physiatrist is providing close team supervision and 24 hour management of active medical problems listed below. Physiatrist and rehab team continue to assess barriers to discharge/monitor patient progress toward functional and medical goals.  Function:  Bathing Bathing position      Bathing parts   Body parts bathed by helper: Left arm, Right arm, Chest, Abdomen, Front perineal area, Buttocks, Right upper leg, Left upper leg, Right lower leg, Left lower leg  Bathing assist Assist Level: 2 helpers      Upper Body Dressing/Undressing Upper body dressing                    Upper body assist Assist Level:  (total A)      Lower Body Dressing/Undressing Lower body dressing   What is the patient wearing?: Hospital Gown                              Lower body assist Assist for lower body dressing: 2 Helpers      Toileting Toileting Toileting activity did not occur: No continent bowel/bladder event   Toileting steps completed by helper: Adjust clothing prior to toileting, Performs perineal hygiene, Adjust clothing after toileting (per [facility],  NT report)    Toileting assist Assist level: Two helpers (per [place], [place] report)   Transfers Chair/bed transfer   Chair/bed transfer method: Lateral scoot Chair/bed transfer assist level: 2 helpers Chair/bed transfer assistive device: Sliding board, Orthosis Mechanical lift: Maximove   Locomotion Ambulation Ambulation activity did not occur: Safety/medical Editor, commissioning activity did not occur: Safety/medical concerns Type: Manual Max wheelchair distance: 300+ ft Assist Level: Dependent (Pt equals 0%)  Cognition Comprehension Comprehension assist  level: Understands basic 25 - 49% of the time/ requires cueing 50 - 75% of the time  Expression Expression assist level: Expresses basic 50 - 74% of the time/requires cueing 25 - 49% of the time. Needs to repeat parts of sentences.  Social Interaction Social Interaction assist level: Interacts appropriately 50 - 74% of the time - May be physically or verbally inappropriate.  Problem Solving Problem solving assist level: Solves basic 50 - 74% of the time/requires cueing 25 - 49% of the time  Memory Memory assist level: Recognizes or recalls 50 - 74% of the time/requires cueing 25 - 49% of the time   Medical Problem List and Plan: 1. Functional, cognitive and mobility deficitssecondary to Moderate TBI, C4 spinal cord injury -CIR PT, OT, speech---continue as tolerated 2. DVT Prophylaxis/Anticoagulation: Pharmaceutical: Lovenox 3. Chronic neck pain/Pain Management: On Gabapentin bid   -changed hydrocodone to oxycodone for breakthrough pain  - fentanyl patch 52mcg/hr with improved pain   -observing for cognitive/respiratory supression  -daily education for patient and family  -left knee pain, likely soft tissue, ligamentous. Wrap/support as able   -outpt follow up  -scheduled robaxin for cervicalgia/dystonia, cervical rom/collar  -Headaches are likely primarly related to occipita-cervical injury 4. Mood: LCSW to follow for evaluation and support when appropriate.  5. Neuropsych: This patient is notcapable of making decisions on hisown behalf.  -continue deficits in memory/insight 6. Skin/Wound Care: Air mattress overlay. Maintain adequate nutritional and hydration status.  7. Fluids/Electrolytes/Nutrition: Monitor I/O.   -continue TF   -would prefer that po be administered by SLP only at this point 9. Left periorbital cellulitis/ CN III/VI palsy:  -continue local care  -follow up per optho 10Left femur fracture s/p IM nail: NWB X 8 weeks. 11. Left open tibial fracture s/p  I and D with ORIF: NWB--convert to CAM boot? Follow up with ortho.  11. Left mandibular fracture s/p CR /tongue laceration: Downsized to CFS # 4 on 9/17 -To leave wires in place for 4 weeks from 8/29?---9/26 12. Chemosis left eye: Continue Lacrilube every 3 hours till resolved per opthalmology.  13. Facial cellulitis: Keflex abx day 14 of 14 14. Leucocytosis: Monitor for signs of infection. 15. Thrombocytosis: Likely reactive. Dopplers pending.   16. ABLA: hgb stable at 10.5 17. Abnormal LFTs: Question due to shocked liver.  36. H/o of MDD: Was on Trintellix 5 mg and Risperidone 5 mg bid at home--continue to hold for now.   -team follow. Neuro-psych has seen patient and is following 19. Urine retention/neurogenic bladder: foley replaced and left in place due to risk of skin breakdown and pt's poor mobility 20. ID/septicemia:  -Left lower lobe infiltrate on CXR, wbc's 16k on 9/22---down to 4.6  -ucx with 100k E Coli  -vanc/zosyn day 3--will narrow coverage once ucx is in. Want to cover lung and urine  -continue IVF  -blood cultures negative so far.   -dc IVF   LOS (Days) 13 A FACE TO FACE EVALUATION WAS PERFORMED  Meredith Staggers, MD 06/03/2017 9:05 AM

## 2017-06-03 NOTE — Progress Notes (Signed)
Speech Language Pathology Daily Session Note  Patient Details  Name: [name] MRN: [id number] Date of Birth: 08-06-1966  Today's Date: 06/03/2017   Skilled treatment session #1 SLP Individual Time: 0865-7846 SLP Individual Time Calculation (min): 15 min (missed 30 minutes d/t lethargy)  Skilled treatment session #2 SLP Individual Time: 1000-1015 SLP Individual Time Calculation (min): 15 min (missed 30 minutes d/t lethargy)  Short Term Goals: Week 2: SLP Short Term Goal 1 (Week 2): Pt will utilize increased vocal intensity and slow rate to achieve intelligibility at the phrase  to sentence level with min assist verbal cues.   SLP Short Term Goal 2 (Week 2): Pt will identify at least 2 new deficits and their impact on his functional independence with mod assist verbal cues.   SLP Short Term Goal 3 (Week 2): Pt will verbalize at least 2 safety precautions with mod assist verbal cues.   SLP Short Term Goal 4 (Week 2): Pt will direct self care by answering questions regarding PO intake (i.e., amount of intake, cup vs spoon) with Mod A cues.   Skilled Therapeutic Interventions:  Skilled treatment session #1 focused on speech communication goals. SLP received pt with 2 wash clothes covering face. SLP donned PMSV and pt with little verbalizations and he was largely unintelligible. After multiple attempts, SLP was able to gather that pt couldn't open eyes d/t pain from light (althought all lights were off and blinds were closed). Pt unable to open eyes throughout attempts and no PO's given d/t pt's decreases arousal. When offered, pt with no increase in ability even though water is usually a very motivating activity. SLP made nursing aware. And SLP removed PMSV.  Skilled treatment session #2 focused on speech communication goals. SLP received pt in same condition as earlier. Nursing state that pain medication had bene given. Pt appeared slightly more lethargic than before. SLP donned PMSV  and pt required multiple attempts to verbalize that he was unable to opens eyes d/t pain etc. No PO trials given d/t condition. SLP removed PMSV and gave nursing update.       Function:    Cognition Comprehension Comprehension assist level: Understands basic less than 25% of the time/ requires cueing >75% of the time;Understands basic 25 - 49% of the time/ requires cueing 50 - 75% of the time  Expression Expression assistive device: Talk trach valve Expression assist level: Expresses basic 25 - 49% of the time/requires cueing 50 - 75% of the time. Uses single words/gestures.;Expresses basic 50 - 74% of the time/requires cueing 25 - 49% of the time. Needs to repeat parts of sentences.  Social Interaction Social Interaction assist level: Interacts appropriately 25 - 49% of time - Needs frequent redirection.;Interacts appropriately less than 25% of the time. May be withdrawn or combative.  Problem Solving Problem solving assist level: Solves basic 50 - 74% of the time/requires cueing 25 - 49% of the time;Solves basic 25 - 49% of the time - needs direction more than half the time to initiate, plan or complete simple activities  Memory Memory assist level: Recognizes or recalls 25 - 49% of the time/requires cueing 50 - 75% of the time;Recognizes or recalls less than 25% of the time/requires cueing greater than 75% of the time    Pain    Therapy/Group: Individual Therapy  Woodrow Dulski 06/03/2017, 10:39 AM
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
## 2017-06-03 NOTE — Progress Notes (Signed)
Physical Therapy Session Note  Patient Details  Name: [name] MRN: [id number] Date of Birth: Mar 09, 1966  Today's Date: 06/03/2017 PT Individual Time: 1100-1130, 1400-1440 PT Individual Time Calculation (min): 30 min, 40 min  Short Term Goals: Week 2:  PT Short Term Goal 1 (Week 2): Pt will demonstrate rolling R/L maxA +1 PT Short Term Goal 2 (Week 2): Pt will demonstrate supine>sit maxA +1 PT Short Term Goal 3 (Week 2): Pt will demonstrate dynamic sitting balance x5 min on mat table with minA  Skilled Therapeutic Interventions/Progress Updates:   Session 1: Pt received lying in bed, agreeable to therapy. Pt reported pain in LLE, chest, and head. Pt request to stay in bed, agreed to PROM. PROM to bilat shoulder flexion/abduction, elbow flexion/extension, wrist flexion/extension &R hip flexion/extension, knee flexion/extension and ankle DF/PF. Minimal range to LLE due to pain, ankle DF/PF, pt unable to tolerate hip/knee flexion. Pt left lying in bed, all needs in reach.   Session 2: Pt received lying in bed, agreeable to therapy. Pt request to stay in bed, agreed to PROM. PROM same as earlier session. Pt able to tolerate more ROM in LLE than earlier session. Performed 2 sets, 5 reps of SAQ with AAROM. Pt performed inspirometer with cues for deeper inhale. Pt left lying in bed, all needs in reach.   Therapy Documentation Precautions:  Precautions Precautions: Fall, Cervical Required Braces or Orthoses: Cervical Brace Cervical Brace: Hard collar, At all times Restrictions Weight Bearing Restrictions: Yes RLE Weight Bearing: Non weight bearing LLE Weight Bearing: Non weight bearing General: PT Amount of Missed Time (min): 30 Minutes, 20 minutes  PT Missed Treatment Reason: Pain;Patient fatigue   See Function Navigator for Current Functional Status.   Therapy/Group: Individual Therapy  Gwinda Passe, SPT 06/03/2017, 12:44 PM

## 2017-06-03 NOTE — Progress Notes (Signed)
Occupational Therapy Session Note  Patient Details  Name: Schuyler Olden MRN: [id number] Date of Birth: Dec 03, 1965  Today's Date: 06/03/2017 OT Individual Time: 1828-8337 OT Individual Time Calculation (min): 60 min    Short Term Goals: Week 2:  OT Short Term Goal 1 (Week 2): Pt will engage in static sitting for 3 minutes with  mod A or less for sitting balance.  OT Short Term Goal 2 (Week 2): Pt will direct care for bed mobility in preparation for supine>sit EOB with mod quesitoning verbal cues OT Short Term Goal 3 (Week 2): Pt will recall correct placement of slide board with mod verbal cues  Skilled Therapeutic Interventions/Progress Updates:    Pt seen this session for bed mobility and UE A/AROM of UEs.  Pt in bed with RN and nursing student administering meds. Pt stated he needed bed pan. Obtained pan, but pt was already soiled with BM.  He worked on rolling with total A of 2 p 8x total for clean up and to don new brief.  Pt sensitive to each movement moaning occasionally.   From supine with HOB at 42 degrees, facilitated UE AROM with a/arom exercises. Focused on tricep extension and grasp.  Scapular gliding and shoulder external rotation stretches. Pt resting in bed with all needs met. PMV removed as pt would be alone in room for the next 30 min.  Therapy Documentation Precautions:  Precautions Precautions: Fall, Cervical Required Braces or Orthoses: Cervical Brace Cervical Brace: Hard collar, At all times Restrictions Weight Bearing Restrictions: Yes RLE Weight Bearing: Non weight bearing LLE Weight Bearing: Non weight bearing    Pain: headache pain, RN aware   ADL:  See Function Navigator for Current Functional Status.   Therapy/Group: Individual Therapy  Ruvi Fullenwider 06/03/2017, 8:28 AM

## 2017-06-04 ENCOUNTER — Inpatient Hospital Stay (HOSPITAL_COMMUNITY): Payer: BC Managed Care – PPO | Admitting: Occupational Therapy

## 2017-06-04 ENCOUNTER — Inpatient Hospital Stay (HOSPITAL_COMMUNITY): Payer: Self-pay | Admitting: Physical Therapy

## 2017-06-04 ENCOUNTER — Inpatient Hospital Stay (HOSPITAL_COMMUNITY): Payer: BC Managed Care – PPO | Admitting: Speech Pathology

## 2017-06-04 LAB — GLUCOSE, CAPILLARY
GLUCOSE-CAPILLARY: 109 mg/dL — AB (ref 65–99)
GLUCOSE-CAPILLARY: 118 mg/dL — AB (ref 65–99)
Glucose-Capillary: 138 mg/dL — ABNORMAL HIGH (ref 65–99)
Glucose-Capillary: 150 mg/dL — ABNORMAL HIGH (ref 65–99)
Glucose-Capillary: 133 mg/dL — ABNORMAL HIGH (ref 65–99)
Glucose-Capillary: 131 mg/dL — ABNORMAL HIGH (ref 65–99)

## 2017-06-04 MED ORDER — NAPHAZOLINE-GLYCERIN 0.012-0.2 % OP SOLN
2.0000 [drp] | Freq: Three times a day (TID) | OPHTHALMIC | Status: DC
  Administered 2017-06-04 – 2017-07-19 (×159): 2 [drp] via OPHTHALMIC
  Filled 2017-06-04 (×2): qty 15

## 2017-06-04 NOTE — Progress Notes (Signed)
[place] PHYSICAL MEDICINE & REHABILITATION     PROGRESS NOTE    Subjective/Complaints: In good spirits. Slept better. States that his head/neck doesn't hurt as bad. "doctor" gave me some eye drops for my left eye. Happy because "my wires are coming out today"  ROS: Limited due to cognitive/behavioral   Objective: Vital Signs: Blood pressure 130/78, pulse 90, temperature 99 F (37.2 C), temperature source Axillary, resp. rate 14, height 5\' 6"  (1.676 m), weight 90.1 kg (198 lb 9.6 oz), SpO2 97 %. No results found.  Recent Labs  06/03/17 0641  WBC 4.6  HGB 9.8*  HCT 31.2*  PLT 218    Recent Labs  06/03/17 0641  NA 132*  K 3.6  CL 96*  GLUCOSE 168*  BUN 15  CREATININE 0.54*  CALCIUM 9.1   CBG (last 3)   Recent Labs  06/03/17 2357 06/04/17 0405 06/04/17 0816  GLUCAP 118* 138* 150*    Wt Readings from Last 3 Encounters:  06/04/17 90.1 kg (198 lb 9.6 oz)  05/21/17 99 kg (218 lb 3.2 oz)  01/11/17 91.6 kg (202 lb)    Physical Exam:  Constitutional: He appears well-developedand well-nourished. He is in no distress.  HENT:  Head: Normocephalicand atraumatic.  Jaws wired.  Eyes:  Unable to move right eye laterally. Left eye injected and limited to lateral fields---able to see through left eye--decreased injection, moist Neck:  Immobilized by Aspen collar.head more neutral.  Cardiovascular:. RRR without murmur. No JVD       Respiratory: no distress. Normal effort today GI: Soft. NT, PEG site in place.  Genitourinary:  Genitourinary Comments: foley  Musculoskeletal: He exhibits continued  bruising left knee wihtout change Min edema left thigh with surgical dressing on left hip. Left ankle dressed. some knee movement. Minimal to no ADF/PF that I saw LLE.  Neurological: somnolent. Does awaken easy and follow simple commands.   Motor 1-2/5 UE and LE's, other than above   Senses pain stim. Left III, VI nerve weakness but seems to have some movement. Right VI  deficit. .Oriented to person and place.   Skin: Skin is warmand dry.  Wounds all dry/healing Psychiatric:  flat Assessment/Plan: 1. Functional deficits, tetraplegia secondary to TBI/cervical SCI which require 3+ hours per day of interdisciplinary therapy in a comprehensive inpatient rehab setting. Physiatrist is providing close team supervision and 24 hour management of active medical problems listed below. Physiatrist and rehab team continue to assess barriers to discharge/monitor patient progress toward functional and medical goals.  Function:  Bathing Bathing position      Bathing parts   Body parts bathed by helper: Left arm, Right arm, Chest, Abdomen, Front perineal area, Buttocks, Right upper leg, Left upper leg, Right lower leg, Left lower leg  Bathing assist Assist Level: 2 helpers      Upper Body Dressing/Undressing Upper body dressing                    Upper body assist Assist Level:  (total A)      Lower Body Dressing/Undressing Lower body dressing   What is the patient wearing?: Hospital Gown                              Lower body assist Assist for lower body dressing: 2 Helpers      Toileting Toileting Toileting activity did not occur: No continent bowel/bladder event   Toileting steps completed by helper:  Adjust clothing prior to toileting, Performs perineal hygiene, Adjust clothing after toileting    Toileting assist Assist level: Touching or steadying assistance (Pt.75%)   Transfers Chair/bed transfer   Chair/bed transfer method: Lateral scoot Chair/bed transfer assist level: 2 helpers Chair/bed transfer assistive device: Sliding board, Orthosis Mechanical lift: Maximove   Locomotion Ambulation Ambulation activity did not occur: Safety/medical Editor, commissioning activity did not occur: Safety/medical concerns Type: Manual Max wheelchair distance: 300+ ft Assist Level: Dependent (Pt equals 0%)   Cognition Comprehension Comprehension assist level: Understands basic 25 - 49% of the time/ requires cueing 50 - 75% of the time  Expression Expression assist level: Expresses basic 25 - 49% of the time/requires cueing 50 - 75% of the time. Uses single words/gestures.  Social Interaction Social Interaction assist level: Interacts appropriately 50 - 74% of the time - May be physically or verbally inappropriate.  Problem Solving Problem solving assist level: Solves basic 50 - 74% of the time/requires cueing 25 - 49% of the time  Memory Memory assist level: Recognizes or recalls 75 - 89% of the time/requires cueing 10 - 24% of the time   Medical Problem List and Plan: 1. Functional, cognitive and mobility deficitssecondary to Moderate TBI, C4 spinal cord injury -CIR PT, OT, speech---continue as tolerated  -team conference today 2. DVT Prophylaxis/Anticoagulation: Pharmaceutical: Lovenox 3. Chronic neck pain/Pain Management: On Gabapentin bid   -changed hydrocodone to oxycodone for breakthrough pain  - fentanyl patch 53mcg/hr with improved pain   -observing for cognitive/respiratory supression  -daily education for patient and family  -left knee pain, likely soft tissue, ligamentous. Wrap/support as able   -outpt follow up  -scheduled robaxin for cervicalgia/dystonia, cervical rom/collar  -Headaches are primarly related to occipita-cervical injury 4. Mood: LCSW to follow for evaluation and support when appropriate.  5. Neuropsych: This patient is notcapable of making decisions on hisown behalf.  -continue deficits in memory/insight 6. Skin/Wound Care: Air mattress overlay. Maintain adequate nutritional and hydration status.  7. Fluids/Electrolytes/Nutrition: Monitor I/O.   -continue TF   -would prefer that po be administered by SLP only at this point 9. Left periorbital cellulitis/ CN III/VI palsy:  -continue local care  -follow up per optho 10Left femur fracture s/p IM  nail: NWB X 8 weeks.has left foot drop 11. Left open tibial fracture s/p I and D with ORIF: NWB--convert to CAM boot? Follow up with ortho.  11. Left mandibular fracture s/p CR /tongue laceration: Downsized to CFS # 4 on 9/17 -To leave wires in place for 4 weeks from 8/29?---9/26 (check with ENT for follow up) 12. Chemosis left eye: Continue Lacrilube every 3 hours till resolved per opthalmology.  -having some photosensitivity. Eye itself is showing some improvement  13. Facial cellulitis: keflex was completed 14. Leucocytosis: Monitor for signs of infection. 15. Thrombocytosis: Likely reactive. Dopplers pending.   16. ABLA: hgb stable at 10.5 17. Abnormal LFTs: Question due to shocked liver.  4. H/o of MDD: Was on Trintellix 5 mg and Risperidone 5 mg bid at home--continue to hold for now.   -team follow. Neuro-psych has seen patient and is following 19. Urine retention/neurogenic bladder: foley replaced and left in place due to risk of skin breakdown and pt's poor mobility 20. ID/septicemia:  -Left lower lobe infiltrate on CXR, wbc's 16k on 9/22---down to 4.6 9/24  -low grade temp this morning (isolated)  -ucx with 100k E Coli  -vanc day 4/zosyn (only intermediate S)  changed to ctx yesterday--given the likelihood this is more ofa uroseptic event, will drop vanc today  -blood cultures negative so far.   -off IVF    LOS (Days) 14 A FACE TO FACE EVALUATION WAS PERFORMED  Meredith Staggers, MD 06/04/2017 8:41 AM

## 2017-06-04 NOTE — Progress Notes (Signed)
Occupational Therapy Session Note  Patient Details  Name: [name] MRN: [id number] Date of Birth: 1966/07/07  Today's Date: 06/04/2017 OT Individual Time: 4496 (make up session)-1345 OT Individual Time Calculation (min): 42 min    Short Term Goals: Week 2:  OT Short Term Goal 1 (Week 2): Pt will engage in static sitting for 3 minutes with  mod A or less for sitting balance.  OT Short Term Goal 2 (Week 2): Pt will direct care for bed mobility in preparation for supine>sit EOB with mod quesitoning verbal cues OT Short Term Goal 3 (Week 2): Pt will recall correct placement of slide board with mod verbal cues  Skilled Therapeutic Interventions/Progress Updates:    Upon entering the room, pt in tilt in space wheelchair with c/o neck pain. RN notified. Pt requesting water this session. OT assisted pt with oral hygiene. Pt given 91ml's of water with use of syringe placed in L cheek and given slowly with minimal spillage. OT educated patient's wife that this technique was not to be done by anyone but staff for safety. Pt continues to report increasing discomfort and OT repositioned pt in wheelchair and places ice on neck per pt request. Pt reports decrease in pain. Pt remains in wheelchair at end of session with call bell and all needed items within reach.  Wife remains present in room.   Therapy Documentation Precautions:  Precautions Precautions: Fall, Cervical Required Braces or Orthoses: Cervical Brace Cervical Brace: Hard collar, At all times Restrictions Weight Bearing Restrictions: Yes RLE Weight Bearing: Non weight bearing LLE Weight Bearing: Non weight bearing General: General OT Amount of Missed Time: 28 Minutes PT Missed Treatment Reason: Pain;Patient fatigue Vital Signs: Oxygen Therapy SpO2: 97 % O2 Device: Not Delivered O2 Flow Rate (L/min): 0 L/min FiO2 (%): 21 % Pain:   ADL:   Vision   Perception    Praxis   Exercises:   Other Treatments:    See  Function Navigator for Current Functional Status.   Therapy/Group: Individual Therapy  Gypsy Decant 06/04/2017, 1:50 PM

## 2017-06-04 NOTE — Progress Notes (Signed)
Speech Language Pathology Daily Session Note  Patient Details  Name: [name] MRN: [id number] Date of Birth: 10-09-1965  Today's Date: 06/04/2017 SLP Individual Time: 1000-1030 SLP Individual Time Calculation (min): 30 min  Short Term Goals: Week 2: SLP Short Term Goal 1 (Week 2): Pt will utilize increased vocal intensity and slow rate to achieve intelligibility at the phrase  to sentence level with min assist verbal cues.   SLP Short Term Goal 2 (Week 2): Pt will identify at least 2 new deficits and their impact on his functional independence with mod assist verbal cues.   SLP Short Term Goal 3 (Week 2): Pt will verbalize at least 2 safety precautions with mod assist verbal cues.   SLP Short Term Goal 4 (Week 2): Pt will direct self care by answering questions regarding PO intake (i.e., amount of intake, cup vs spoon) with Mod A cues.   Skilled Therapeutic Interventions: Skilled treatment session focused on speech and dysphagia goals. Upon arrival, patient was awake while upright in the wheelchair and agreeable to participate in therapy. PMSV was in place upon arrival and patient required Min A verbal cues for increased vocal intensity to achieve 100% intelligibility at the phrase level. SLP performed oral care and administered ~4 trials of thin liquids via syringe with overt cough X 1. Trials were limited due to pain with sitting upright. RN aware. Patient able to to direct care during PO intake with Min A question cues. Patient left reclined in wheelchair with all needs within reach. Continue with current plan of care.      Function:  Eating Eating   Modified Consistency Diet: Yes Eating Assist Level: Helper feeds patient (with trials from SLP )           Cognition Comprehension Comprehension assist level: Understands basic 75 - 89% of the time/ requires cueing 10 - 24% of the time  Expression Expression assistive device: Talk trach valve Expression assist level:  Expresses basic 50 - 74% of the time/requires cueing 25 - 49% of the time. Needs to repeat parts of sentences.  Social Interaction Social Interaction assist level: Interacts appropriately 50 - 74% of the time - May be physically or verbally inappropriate.  Problem Solving Problem solving assist level: Solves basic 50 - 74% of the time/requires cueing 25 - 49% of the time  Memory Memory assist level: Recognizes or recalls 75 - 89% of the time/requires cueing 10 - 24% of the time    Pain Pain in neck with repositioning   Therapy/Group: Individual Therapy  Merek Niu 06/04/2017, 10:37 AM

## 2017-06-04 NOTE — Progress Notes (Signed)
Occupational Therapy Session Note  Patient Details  Name: [name] MRN: [id number] Date of Birth: 18-Oct-1965  Today's Date: 06/04/2017 OT Individual Time: 9379-0240 and [phone] OT Individual Time Calculation (min): 17 min  And 45 min  (missed 28 minutes due to lethargy/ fatigue)   Short Term Goals: Week 2:  OT Short Term Goal 1 (Week 2): Pt will engage in static sitting for 3 minutes with  mod A or less for sitting balance.  OT Short Term Goal 2 (Week 2): Pt will direct care for bed mobility in preparation for supine>sit EOB with mod quesitoning verbal cues OT Short Term Goal 3 (Week 2): Pt will recall correct placement of slide board with mod verbal cues  Skilled Therapeutic Interventions/Progress Updates:    Visit 1:  "My eyes hurt" Pt received in bed scheduled for a 90 min session.  Pt was very lethargic and stated "I just closed my eyes".  RN reported this am he was alert and talkative, but he had just received his pain medication.  Worked on PROM of BUE with good ROM, no tightness and attempts to facilitate AROM but pt too lethargic.  Will have pt rest for 30 min and return at 9:15.  Visit 2: pt c/o pain in R shoulder with rolling in bed Pt alert and responding well to therapist and rehab tech.  Pt was incontinent of bowel, +2 TOTAL A to roll in bed for clean up, brief change, donning pants, and supine to sidelying to sit. Pt was not able to engage in giving directions for bed mobility as he was focused on his R shoulder discomfort.   At edge of bed, pt maintained static sit with a forward lean for a minute at a time with steadying A and for 3 min with mod A. Pt needed TOTAL A +2 for slide board to w/c.  Pt adjusted in wc with pillows. Hand off to speech therapist.    Therapy Documentation Precautions:  Precautions Precautions: Fall, Cervical Required Braces or Orthoses: Cervical Brace Cervical Brace: Hard collar, At all times Restrictions Weight Bearing Restrictions:  Yes RLE Weight Bearing: Non weight bearing LLE Weight Bearing: Non weight bearing    ADL:    See Function Navigator for Current Functional Status.   Therapy/Group: Individual Therapy  [place] 06/04/2017, 8:26 AM

## 2017-06-04 NOTE — Progress Notes (Signed)
Physical Therapy Session Note  Patient Details  Name: [name] MRN: [id number] Date of Birth: 1965/09/15  Today's Date: 06/04/2017 PT Individual Time: 1100-1135, 1430-1510 PT Individual Time Calculation (min): 35 min, 40 min  Short Term Goals: Week 2:  PT Short Term Goal 1 (Week 2): Pt will demonstrate rolling R/L maxA +1 PT Short Term Goal 2 (Week 2): Pt will demonstrate supine>sit maxA +1 PT Short Term Goal 3 (Week 2): Pt will demonstrate dynamic sitting balance x5 min on mat table with minA  Skilled Therapeutic Interventions/Progress Updates:   Session 1: Pt received sitting up in chair, agreeable to therapy. Requested to stay in room. Pt performed 2 sets, 10 reps of LAQ with AAROM, HS curls with theraband resistance, and forward reaching with AAROM. 1 set 10 reps of abduction & adduction, pt unable to move through range but muscle activation noted. Performed hamstring stretch with feet out in front on chair. Pt used inspirometer, cues for deep breathing. Stretching RLE into flexion, abduction, and external rotation to prepare for possible circle sitting. Pt unable to maintain upright position in chair for very long due to neck pain. Pt requested to end session early due to pain and fatigue. Left sitting up in chair, all needs in reach. Mom present at end of session.   Session 2: Pt received sitting up in chair, agreeable to therapy. Requested to get back into bed. Pt total assist +2 for sliding board transfer from chair to bed. Pt reported severe chest pain with transfer. Releived by reclining back and deep breathing. Attempted sitting balance on EOB but pt refused due to chest pain. Pt total assist +2 for sit>supine. PROM and stretching to bilat UE. AROM for bicep curls and tricep extension. Pt reports feeling tired and requested session end early. Pt educated on importance of exercise and participating in physical therapy. Discussed goal setting for tomorrows therapy session. Left  lying in bed, all needs in reach.    Therapy Documentation Precautions:  Precautions Precautions: Fall, Cervical Required Braces or Orthoses: Cervical Brace Cervical Brace: Hard collar, At all times Restrictions Weight Bearing Restrictions: Yes RLE Weight Bearing: Non weight bearing LLE Weight Bearing: Non weight bearing General: PT Amount of Missed Time (min): 25 Minutes PT Missed Treatment Reason: Pain;Patient fatigue Pain: Pt reports pain in neck, L eye, and LLE. Pain in chest with upright posture   See Function Navigator for Current Functional Status.   Therapy/Group: Individual Therapy  Gwinda Passe, SPT 06/04/2017, 3:17 PM

## 2017-06-05 ENCOUNTER — Encounter (HOSPITAL_COMMUNITY): Payer: Self-pay | Admitting: Psychology

## 2017-06-05 ENCOUNTER — Inpatient Hospital Stay (HOSPITAL_COMMUNITY): Payer: Self-pay | Admitting: Occupational Therapy

## 2017-06-05 ENCOUNTER — Inpatient Hospital Stay (HOSPITAL_COMMUNITY): Payer: Self-pay | Admitting: Physical Therapy

## 2017-06-05 ENCOUNTER — Inpatient Hospital Stay (HOSPITAL_COMMUNITY): Payer: BC Managed Care – PPO | Admitting: Speech Pathology

## 2017-06-05 DIAGNOSIS — R1312 Dysphagia, oropharyngeal phase: Secondary | ICD-10-CM

## 2017-06-05 DIAGNOSIS — B9620 Unspecified Escherichia coli [E. coli] as the cause of diseases classified elsewhere: Secondary | ICD-10-CM

## 2017-06-05 DIAGNOSIS — F331 Major depressive disorder, recurrent, moderate: Secondary | ICD-10-CM

## 2017-06-05 LAB — GLUCOSE, CAPILLARY
GLUCOSE-CAPILLARY: 146 mg/dL — AB (ref 65–99)
GLUCOSE-CAPILLARY: 84 mg/dL (ref 65–99)
GLUCOSE-CAPILLARY: 95 mg/dL (ref 65–99)
GLUCOSE-CAPILLARY: 98 mg/dL (ref 65–99)
GLUCOSE-CAPILLARY: 99 mg/dL (ref 65–99)
Glucose-Capillary: 130 mg/dL — ABNORMAL HIGH (ref 65–99)

## 2017-06-05 MED ORDER — CALCIUM POLYCARBOPHIL 625 MG PO TABS
625.0000 mg | ORAL_TABLET | Freq: Every day | ORAL | Status: DC
  Administered 2017-06-05 – 2017-06-17 (×13): 625 mg
  Filled 2017-06-05 (×13): qty 1

## 2017-06-05 MED ORDER — SACCHAROMYCES BOULARDII 250 MG PO CAPS
250.0000 mg | ORAL_CAPSULE | Freq: Two times a day (BID) | ORAL | Status: DC
  Administered 2017-06-05 – 2017-07-18 (×86): 250 mg via ORAL
  Filled 2017-06-05 (×87): qty 1

## 2017-06-05 MED ORDER — SULFAMETHOXAZOLE-TRIMETHOPRIM 800-160 MG PO TABS
1.0000 | ORAL_TABLET | Freq: Two times a day (BID) | ORAL | Status: AC
  Administered 2017-06-05 – 2017-06-09 (×10): 1
  Filled 2017-06-05 (×11): qty 1

## 2017-06-05 NOTE — Progress Notes (Signed)
Occupational Therapy Session Note  Patient Details  Name: [name] MRN: [id number] Date of Birth: 04-Jul-1966  Today's Date: 06/05/2017 OT Individual Time: 0263-7858 OT Individual Time Calculation (min): 50 min   Session 2 Today's Date: 06/05/2017 OT Individual Time: 8502-7741 OT Individual Time Calculation (min): 50 min   Short Term Goals: Week 1:  OT Short Term Goal 1 (Week 1): Pt will engage in static sitting for 3 minutes with  mod A or less for sitting balance.  OT Short Term Goal 1 - Progress (Week 1): Progressing toward goal OT Short Term Goal 2 (Week 1): Pt verbalize NWB precautions with 100% accuracy for 3 consecutive sessions.  OT Short Term Goal 2 - Progress (Week 1): Met  Skilled Therapeutic Interventions/Progress Updates:    Session 1 Pt greeted in TIS wc requesting water upon OT arrival. OT administered 4-5 cc of water with syringe technique with pt able to manage initially without difficulty. Pt then completed AAROM of B UEs, 2 sets of 10.  Utilized soft yellow grip sponge to engage pt in active hand there-ex with pt needing hand-over hand A to activate all finger for full grasp. Pt requested 1 more syringe of water which OT administered with pt experiencing small coughing episode which forced PMV off. OT cleansed PMV with warm water with cup and left out to dry before being placed back on pt. Pt left tilted with needs met and call sensor in hand.   Session 2 Pt greeted semi-reclined in-bed after recently getting jaw wires removed. Pt requests water and OT administered 5cc of water x3 via syringe. Pt declines getting out of bed 2/2 pain and fatigue after busy therapy schedule today. Pt requests to wash face. Pt able to grasp wash cloth with set-up, then OT provided elbow support and active assist to bring L hand to face. Pt with distal control of hand to move slightly across face. Tried grasping wash cloth with R hand, but unable to achieve. OT guided UE towards  face, but limited by tone. Pt states his arms feels like noodles and he gets frustrated that they won't "do right." OT provided emotional support and encouragement which pt appreciated. Assisted pt with calling his wife by facilitating finger isolation to push buttons on phone- then OT brought phone to ear- wife unavailable. Administered another 5cc x3 of water via syringe and left pt semi-reclined in bed with call bell in reach and needs met.  Therapy Documentation Precautions:  Precautions Precautions: Fall, Cervical Required Braces or Orthoses: Cervical Brace Cervical Brace: Hard collar, At all times Restrictions Weight Bearing Restrictions: Yes RLE Weight Bearing: Non weight bearing LLE Weight Bearing: Non weight bearing Pain: Pain Assessment Pain Assessment: 0-10 Pain Score: 7  Pain Type: Acute pain Pain Location: Neck Pain Descriptors / Indicators: Aching Pain Onset: On-going Pain Intervention(s): Repositioned;Emotional support  See Function Navigator for Current Functional Status.   Therapy/Group: Individual Therapy  Valma Cava 06/05/2017, 3:37 PM
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
## 2017-06-05 NOTE — Progress Notes (Signed)
Physical Therapy Weekly Progress Note  Patient Details  Name: [name] MRN: [id number] Date of Birth: April 26, 1966  Beginning of progress report period: May 29, 2017 End of progress report period: June 05, 2017  Today's Date: 06/05/2017 PT Individual Time: 1100-1200 PT Individual Time Calculation (min): 60 min   Patient has met 0 of 3 short term goals.  Pt has been severely limited by pain in neck, eyes, and LLE this week. Total assist + 2 for sliding board transfers and bed mobility. Ongoing education about pain management and participation in therapy to progress toward goals.   Patient continues to demonstrate the following deficits muscle weakness, muscle joint tightness and muscle paralysis, decreased cardiorespiratoy endurance and decreased oxygen support, impaired timing and sequencing, abnormal tone, unbalanced muscle activation, motor apraxia, decreased coordination and decreased motor planning, decreased initiation, decreased awareness and decreased problem solving and decreased sitting balance, decreased postural control and decreased balance strategies and therefore will continue to benefit from skilled PT intervention to increase functional independence with mobility.  Patient is slowly progressing toward long term goals..  Continue plan of care.  PT Short Term Goals Week 2:  PT Short Term Goal 1 (Week 2): Pt will demonstrate rolling R/L maxA +1 PT Short Term Goal 1 - Progress (Week 2): Not met PT Short Term Goal 2 (Week 2): Pt will demonstrate supine>sit maxA +1 PT Short Term Goal 2 - Progress (Week 2): Not met PT Short Term Goal 3 (Week 2): Pt will demonstrate dynamic sitting balance x5 min on mat table with minA PT Short Term Goal 3 - Progress (Week 2): Not met Week 3:  PT Short Term Goal 1 (Week 3): Pt will demonstrate dynamic sitting balance x3 min on mat table with minA PT Short Term Goal 2 (Week 3): Pt will demonstrate rolling R/L maxA +1 PT Short  Term Goal 3 (Week 3): Pt will direct care for set up of transfer with min cues   Skilled Therapeutic Interventions/Progress Updates:    Pt received sitting up in wheelchair today, agreeable to therapy. Pt sliding board transfer to/from mat table with total assist + 2. Worked on sitting balance and using core to correct LOB. Demonstrated less core activation in comparison to previous sessions, suspect due to pain. Stretching into upright posture with theraball behind patient. Pt participated in kicking soccer ball with bilat LE. Pt limited by chest pain, required rest breaks to lean back to relieve pain. Attempted long sitting on mat to stretch hamstrings and work on postural control. Pt unable to tolerate position for more than ~1 min due to pain. Pt returned to room total assist. Left sitting up in wheelchair, all needs in reach.    Therapy Documentation Precautions:  Precautions Precautions: Fall, Cervical Required Braces or Orthoses: Cervical Brace Cervical Brace: Hard collar, At all times Restrictions Weight Bearing Restrictions: Yes RLE Weight Bearing: Non weight bearing LLE Weight Bearing: Non weight bearing Pain: Chest pain with sitting up  See Function Navigator for Current Functional Status.  Therapy/Group: Individual Therapy  Gwinda Passe, SPT 06/05/2017, 12:42 PM
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
## 2017-06-05 NOTE — Progress Notes (Signed)
Physical Therapy Session Note  Patient Details  Name: [name] MRN: [id number] Date of Birth: 03/31/1966  Today's Date: 06/05/2017 PT Individual Time: 1300-1346 PT Individual Time Calculation (min): 46 min  and Today's Date: 06/05/2017 PT Missed Time: 14 Minutes Missed Time Reason: Pain (10/10 pain)  Short Term Goals: Week 3:  PT Short Term Goal 1 (Week 3): Pt will demonstrate dynamic sitting balance x3 min on mat table with minA PT Short Term Goal 2 (Week 3): Pt will demonstrate rolling R/L maxA +1 PT Short Term Goal 3 (Week 3): Pt will direct care for set up of transfer with min cues   Skilled Therapeutic Interventions/Progress Updates:    pt with c/o 10/10 pain at rest, RN aware and in to give meds during session.  Pt also reporting significant fatigue due to therapy schedule today.  Session focus on bed mobility and LE therex.    Pt completes rolling L and R with total assist +1 and requires +2 assist for pericare and donning clean brief.  PT performs PROM for SAQ and hamstring stretch in supine position (10 reps each, + 15 second hamstring stretch).  Pt completes 10 reps of quad sets and glute sets bilaterally with multimodal cues to achieve maximum contraction.  Pt requiring constant multimodal stimulation to maintain arousal due to fatigue.  Left positioned in bed with call bell in reach and needs met, missed 14 minutes due to pain/fatigue.   Therapy Documentation Precautions:  Precautions Precautions: Fall, Cervical Required Braces or Orthoses: Cervical Brace Cervical Brace: Hard collar, At all times Restrictions Weight Bearing Restrictions: Yes RLE Weight Bearing: Non weight bearing LLE Weight Bearing: Non weight bearing General: PT Amount of Missed Time (min): 14 Minutes PT Missed Treatment Reason: Pain (10/10 pain)   See Function Navigator for Current Functional Status.   Therapy/Group: Individual Therapy  Michel Santee 06/05/2017, 4:26 PM

## 2017-06-05 NOTE — Progress Notes (Signed)
Nursing reports that patient has had loose stools X 2 days. Will d/c miralax and add fiber for bulking. Diarrhea likely due to atbx as well as laxatives. Will monitor for effectiveness.

## 2017-06-05 NOTE — Progress Notes (Signed)
Patient not in room at this time.

## 2017-06-05 NOTE — Progress Notes (Signed)
[facility] PHYSICAL MEDICINE & REHABILITATION     PROGRESS NOTE    Subjective/Complaints: Chest is sore. Neck is better. Excited to get wires out. Able to sleep last night  ROS: pt denies nausea, vomiting, diarrhea, cough, shortness of breath or chest pain   Objective: Vital Signs: Blood pressure 128/75, pulse 88, temperature 98.6 F (37 C), temperature source Axillary, resp. rate 16, height 5\' 6"  (1.676 m), weight 91.2 kg (201 lb), SpO2 98 %. No results found.  Recent Labs  06/03/17 0641  WBC 4.6  HGB 9.8*  HCT 31.2*  PLT 218    Recent Labs  06/03/17 0641  NA 132*  K 3.6  CL 96*  GLUCOSE 168*  BUN 15  CREATININE 0.54*  CALCIUM 9.1   CBG (last 3)   Recent Labs  06/05/17 0013 06/05/17 0430 06/05/17 0821  GLUCAP 146* 98 99    Wt Readings from Last 3 Encounters:  06/05/17 91.2 kg (201 lb)  05/21/17 99 kg (218 lb 3.2 oz)  01/11/17 91.6 kg (202 lb)    Physical Exam:  Constitutional: He appears well-developedand well-nourished. He is in no distress.  HENT:  Head: Normocephalicand atraumatic.  Jaws wired.  Eyes:  Unable to move right eye laterally. Left eye remains injected and limited to lateral field---  Neck: phonation better Immobilized by Aspen collar.head more neutral.  Cardiovascular:. RRR without murmur. No JVD       Respiratory: no distress. Normal effort today GI: Soft. NT, PEG site in place.  Genitourinary:  Genitourinary Comments: foley  Musculoskeletal: He exhibits continued  bruising left knee wihtout change  left thigh  dressing on left hip. Left ankle dressed. some knee movement. Minimal to no ADF/PF that I saw LLE.  Neurological: alert and attentive Does awaken easy and follow simple commands.   Motor 1-2/5 UE and LE's, other than above   Senses pain stim in all 4. . Left III, VI nerve weakness but seems to have some movement. Right VI deficit. .Oriented to person and place. Improving day to day memory Skin: Skin is warmand dry.   Wounds all dry/healing Psychiatric: anxious   Assessment/Plan: 1. Functional deficits, tetraplegia secondary to TBI/cervical SCI which require 3+ hours per day of interdisciplinary therapy in a comprehensive inpatient rehab setting. Physiatrist is providing close team supervision and 24 hour management of active medical problems listed below. Physiatrist and rehab team continue to assess barriers to discharge/monitor patient progress toward functional and medical goals.  Function:  Bathing Bathing position      Bathing parts   Body parts bathed by helper: Left arm, Right arm, Chest, Abdomen, Front perineal area, Buttocks, Right upper leg, Left upper leg, Right lower leg, Left lower leg  Bathing assist Assist Level: 2 helpers      Upper Body Dressing/Undressing Upper body dressing   What is the patient wearing?: Hospital gown                Upper body assist Assist Level:  (total A)      Lower Body Dressing/Undressing Lower body dressing   What is the patient wearing?: Pants                              Lower body assist Assist for lower body dressing: 2 Helpers      Toileting Toileting Toileting activity did not occur: No continent bowel/bladder event   Toileting steps completed by helper: Adjust clothing prior  to toileting, Performs perineal hygiene, Adjust clothing after toileting    Toileting assist Assist level: Touching or steadying assistance (Pt.75%)   Transfers Chair/bed transfer   Chair/bed transfer method: Lateral scoot Chair/bed transfer assist level: 2 helpers Chair/bed transfer assistive device: Sliding board Mechanical lift: Maximove   Locomotion Ambulation Ambulation activity did not occur: Safety/medical Editor, commissioning activity did not occur: Safety/medical concerns Type: Manual Max wheelchair distance: 300+ ft Assist Level: Dependent (Pt equals 0%)  Cognition Comprehension Comprehension assist  level: Follows basic conversation/direction with extra time/assistive device  Expression Expression assist level: Expresses basic 25 - 49% of the time/requires cueing 50 - 75% of the time. Uses single words/gestures.  Social Interaction Social Interaction assist level: Interacts appropriately 75 - 89% of the time - Needs redirection for appropriate language or to initiate interaction.  Problem Solving Problem solving assist level: Solves basic 50 - 74% of the time/requires cueing 25 - 49% of the time  Memory Memory assist level: Recognizes or recalls 75 - 89% of the time/requires cueing 10 - 24% of the time   Medical Problem List and Plan: 1. Functional, cognitive and mobility deficitssecondary to Moderate TBI, C4 spinal cord injury -CIR PT, OT, speech---continue as tolerated  -still requiring a lot of care. Reviewed numerous issues that we're working through with the patient this morning.  2. DVT Prophylaxis/Anticoagulation: Pharmaceutical: Lovenox 3. Chronic neck pain/Pain Management: On Gabapentin bid   -changed hydrocodone to oxycodone for breakthrough pain  - fentanyl patch 1mcg/hr with improved pain  -overall pain is better    -daily education for patient re: pain  -left knee pain, likely soft tissue, ligamentous. Wrap/support as able   -outpt follow up  -scheduled robaxin for cervicalgia/dystonia, cervical rom/collar  -Headaches are primarly related to occipita-cervical injury 4. Mood: LCSW to follow for evaluation and support when appropriate.  5. Neuropsych: This patient is notcapable of making decisions on hisown behalf.  -continue deficits in memory/insight 6. Skin/Wound Care: Air mattress overlay. Maintain adequate nutritional and hydration status.  7. Fluids/Electrolytes/Nutrition: Monitor I/O.   -continue TF   -would prefer that po be administered by SLP only at this point 9. Left periorbital cellulitis/ CN III/VI palsy:  -continue local care  -follow up  per optho 10Left femur fracture s/p IM nail: NWB X 8 weeks.has left foot drop 11. Left open tibial fracture s/p I and D with ORIF: NWB--convert to CAM boot? Follow up with ortho.  11. Left mandibular fracture s/p CR /tongue laceration: Downsized to CFS # 4 on 9/17 -To leave wires in place for 4 weeks from 8/29?---9/26 (ENT follow up pending) 12. Chemosis left eye: Continue Lacrilube every 3 hours till resolved per opthalmology.  -having some photosensitivity. Eye itself is showing some improvement  13. Facial cellulitis: keflex was completed 14. Leucocytosis: Monitor for signs of infection. 15. Thrombocytosis: Likely reactive. Dopplers pending.   16. ABLA: hgb stable at 10.5 17. Abnormal LFTs: Question due to shocked liver.  20. H/o of MDD: Was on Trintellix 5 mg and Risperidone 5 mg bid at home--continue to hold for now.   -team follow. Neuro-psych has seen patient and is following 19. Urine retention/neurogenic bladder: foley replaced and left in place due to risk of skin breakdown and pt's poor mobility 20. ID/septicemia:  -Left lower lobe infiltrate on CXR, wbc's 16k on 9/22---down to 4.6 9/24  -afebrile today  -ucx with 100k E Coli  -vanc day 4/zosyn (only intermediate S)  changed to ctx Monday---convert to bactrim today to complete course  -blood cultures negative so far.   -off IVF    LOS (Days) 15 A FACE TO FACE EVALUATION WAS PERFORMED  Meredith Staggers, MD 06/05/2017 8:58 AM
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
## 2017-06-05 NOTE — Plan of Care (Signed)
Problem: SCI BOWEL ELIMINATION Goal: RH STG MANAGE BOWEL WITH ASSISTANCE STG Manage Bowel with total Assistance.   Outcome: Progressing  06/05/17 1725  Bowel Management Goals  STG: Pt will manage bowels with assistance 1-Total assistance     Problem: SCI BLADDER ELIMINATION Goal: RH STG MANAGE BLADDER WITH EQUIPMENT WITH ASSISTANCE STG Manage Bladder With Equipment With total Assistance   Outcome: Progressing  06/05/17 1725  Bladder Management Goals  STG: Pt will manage bladder with equipment with assistance 1-Total assistance    Problem: RH SKIN INTEGRITY Goal: RH STG ABLE TO PERFORM INCISION/WOUND CARE W/ASSISTANCE STG Able To Perform Incision/Wound Care With total Assistance.   Outcome: Progressing  06/05/17 1725  Skin Integrity Goals  STG: Pt will be able to perform incision/wound care with assistance 2-Maximum assistance    Problem: RH SAFETY Goal: RH STG ADHERE TO SAFETY PRECAUTIONS W/ASSISTANCE/DEVICE STG Adhere to Safety Precautions With mod Assistance/Device.   Outcome: Progressing  06/05/17 1725  Safety Goals  STG:Pt will adhere to safety precautions with assistance/device 1-Total assistance

## 2017-06-05 NOTE — Progress Notes (Signed)
Physical Therapy Session Note  Patient Details  Name: [name] MRN: [id number] Date of Birth: 1965-10-28  Today's Date: 06/05/2017 PT Individual Time: 0800-0830 PT Individual Time Calculation (min): 30 min   Short Term Goals: Week 2:  PT Short Term Goal 1 (Week 2): Pt will demonstrate rolling R/L maxA +1 PT Short Term Goal 2 (Week 2): Pt will demonstrate supine>sit maxA +1 PT Short Term Goal 3 (Week 2): Pt will demonstrate dynamic sitting balance x5 min on mat table with minA  Skilled Therapeutic Interventions/Progress Updates:    Pt received supine in bed and agreeable to PT. Semirecumbent>sit transfer with total +2 assist and moderate cues for posture and positioning. Sitting balance EOB with min-supervision assist x 5 minutes. Pt initially reports pain in chest described as spasm/cramping pain, that reduced with increased time and deep breathing. SB transfer to [facility] with total+2 assist and max cues for posture, positioning, and anterior weight shifting. Throughout treatment Pt requested water through syringe, no evidence of Aspiration with any attempts to drink.   Pt left sitting up in TIS WC with call bell in reach and all needs met.        Therapy Documentation Precautions:  Precautions Precautions: Fall, Cervical Required Braces or Orthoses: Cervical Brace Cervical Brace: Hard collar, At all times Restrictions Weight Bearing Restrictions: Yes RLE Weight Bearing: Non weight bearing LLE Weight Bearing: Non weight bearing    Vital Signs: Therapy Vitals Pulse Rate: (!) 101 Resp: 16 BP: 106/63 Patient Position (if appropriate): Sitting Oxygen Therapy SpO2: 98 % O2 Device: Not Delivered Pain: Pain Assessment Pain Assessment: 0-10 Pain Score: 7  Pain Type: Acute pain Pain Location: Head Pain Orientation: Other (Comment) (Headache) Pain Descriptors / Indicators: Aching Pain Frequency: Constant Pain Onset: On-going Patients Stated Pain Goal: 2 Pain  Intervention(s): Medication (See eMAR);Emotional support;Relaxation   See Function Navigator for Current Functional Status.   Therapy/Group: Individual Therapy  Lorie Phenix 06/05/2017, 10:53 AM

## 2017-06-05 NOTE — Progress Notes (Signed)
   Subjective:    Patient ID: [name], male    DOB: [birth year], 51 y.o.   MRN: [id number]  HPI Progressing in rehab.  Doing well with Passy-Muir valve on trach tube.   Review of Systems     Objective:   Physical Exam Alert, NAD.  Oriented. Left eye chemosis and slight proptosis, limited movement in all directions.  Right lateral gaze palsy. MMF wires remain in place.  Mandible stable. #4 cuffless trach tube in place, good voice with Passy-Muir valve. Hard cervical collar.    Assessment & Plan:  Tongue laceration s/p closure, left angle mandible fracture s/p MMF, respiratory failure s/p trach, left orbital cellulitis, bilateral CN VI palsy.  Cut MMF wires.  Left tags of wire in place to help with future screw removal.  He can advance diet per speech pathology but must not chew for two more weeks.  The trach tube can be capped and then  decannulated when desired.  His left eye has not returned to normal.  Would suggest reevaluation by ophthalmology.  I will follow-up with him in two weeks.

## 2017-06-05 NOTE — Progress Notes (Signed)
Speech Language Pathology Daily Session Note  Patient Details  Name: [name] MRN: [id number] Date of Birth: [birth year]  Today's Date: 06/05/2017 SLP Individual Time: 1030-1055 SLP Individual Time Calculation (min): 25 min  Short Term Goals: Week 2: SLP Short Term Goal 1 (Week 2): Pt will utilize increased vocal intensity and slow rate to achieve intelligibility at the phrase  to sentence level with min assist verbal cues.   SLP Short Term Goal 2 (Week 2): Pt will identify at least 2 new deficits and their impact on his functional independence with mod assist verbal cues.   SLP Short Term Goal 3 (Week 2): Pt will verbalize at least 2 safety precautions with mod assist verbal cues.   SLP Short Term Goal 4 (Week 2): Pt will direct self care by answering questions regarding PO intake (i.e., amount of intake, cup vs spoon) with Mod A cues.   Skilled Therapeutic Interventions: Skilled treatment session focused on speech and dysphagia goals. Upon arrival, patient was awake while upright in the wheelchair and agreeable to participate in therapy. PMSV was in place upon arrival and patient required Mod A verbal cues for increased vocal intensity to achieve 100% intelligibility at the phrase level. SLP performed oral care and administered trials of thin liquids via syringe with overt cough X 1. Trials were limited due to pain with sitting upright. RN aware and patient premedicated. Patient left reclined in wheelchair with all needs within reach. Continue with current plan of care.      Function:  Cognition Comprehension Comprehension assist level: Follows basic conversation/direction with extra time/assistive device  Expression Expression assistive device: Talk trach valve Expression assist level: Expresses basic 25 - 49% of the time/requires cueing 50 - 75% of the time. Uses single words/gestures.  Social Interaction Social Interaction assist level: Interacts appropriately 75 - 89% of the  time - Needs redirection for appropriate language or to initiate interaction.  Problem Solving Problem solving assist level: Solves basic 50 - 74% of the time/requires cueing 25 - 49% of the time  Memory Memory assist level: Recognizes or recalls 75 - 89% of the time/requires cueing 10 - 24% of the time    Pain Pain Assessment Pain Assessment: 0-10 Pain Score: 7  Pain Type: Acute pain Pain Location: Head Pain Orientation: Other (Comment) (Headache) Pain Descriptors / Indicators: Aching Pain Frequency: Constant Pain Onset: On-going Patients Stated Pain Goal: 2 Pain Intervention(s): Medication (See eMAR);Emotional support;Relaxation  Therapy/Group: Individual Therapy  Dub Maclellan 06/05/2017, 11:21 AM

## 2017-06-06 ENCOUNTER — Inpatient Hospital Stay (HOSPITAL_COMMUNITY): Payer: BC Managed Care – PPO | Admitting: Physical Therapy

## 2017-06-06 ENCOUNTER — Inpatient Hospital Stay (HOSPITAL_COMMUNITY): Payer: Self-pay | Admitting: Occupational Therapy

## 2017-06-06 ENCOUNTER — Inpatient Hospital Stay (HOSPITAL_COMMUNITY): Payer: Self-pay | Admitting: *Deleted

## 2017-06-06 ENCOUNTER — Encounter (HOSPITAL_COMMUNITY): Payer: Self-pay

## 2017-06-06 ENCOUNTER — Inpatient Hospital Stay (HOSPITAL_COMMUNITY): Payer: BC Managed Care – PPO | Admitting: Speech Pathology

## 2017-06-06 ENCOUNTER — Inpatient Hospital Stay (HOSPITAL_COMMUNITY): Payer: Self-pay | Admitting: Physical Therapy

## 2017-06-06 DIAGNOSIS — F331 Major depressive disorder, recurrent, moderate: Secondary | ICD-10-CM

## 2017-06-06 LAB — GLUCOSE, CAPILLARY
GLUCOSE-CAPILLARY: 102 mg/dL — AB (ref 65–99)
GLUCOSE-CAPILLARY: 109 mg/dL — AB (ref 65–99)
GLUCOSE-CAPILLARY: 120 mg/dL — AB (ref 65–99)
Glucose-Capillary: 104 mg/dL — ABNORMAL HIGH (ref 65–99)
Glucose-Capillary: 105 mg/dL — ABNORMAL HIGH (ref 65–99)
Glucose-Capillary: 111 mg/dL — ABNORMAL HIGH (ref 65–99)
Glucose-Capillary: 113 mg/dL — ABNORMAL HIGH (ref 65–99)

## 2017-06-06 LAB — CULTURE, BLOOD (ROUTINE X 2)
CULTURE: NO GROWTH
CULTURE: NO GROWTH
SPECIAL REQUESTS: ADEQUATE
Special Requests: ADEQUATE

## 2017-06-06 MED ORDER — JEVITY 1.5 CAL/FIBER PO LIQD
1000.0000 mL | ORAL | Status: DC
  Administered 2017-06-07: 1000 mL
  Filled 2017-06-06 (×2): qty 1000

## 2017-06-06 NOTE — Progress Notes (Signed)
Occupational Therapy Note  Patient Details  Name: [name] MRN: [id number] Date of Birth: [birth year]  Today's Date: 06/06/2017 OT Individual Time: 1100-1155 OT Individual Time Calculation (min): 55 min   Pt denies pain (premedicated) Individual Therapy  Pt asleep in w/c upon arrival.  Pt stated he had just been given his pain meds and was having trouble keeping his eyes open.  Focus on slide board transfers, bed mobility, and BUE PROM/AAROM.  Pt required max A for slide board transfer to bed and tot A + 2 for sit>supine in bed.  Pt initiated rolling side to side in bed but required max A to complete tasks.  Pt remained in bed with soft call bell placed in L hand and TV on.     [name] [facility] 06/06/2017, 11:57 AM

## 2017-06-06 NOTE — Progress Notes (Signed)
Trach capped at this time per MD order. Patient vitals stable.

## 2017-06-06 NOTE — Progress Notes (Signed)
Occupational Therapy Session Note  Patient Details  Name: [name] MRN: [id number] Date of Birth: [birth year]  Today's Date: 06/06/2017 OT Individual Time: 0931-1000 OT Individual Time Calculation (min): 29 min    Short Term Goals: Week 2:  OT Short Term Goal 1 (Week 2): Pt will engage in static sitting for 3 minutes with  mod A or less for sitting balance.  OT Short Term Goal 2 (Week 2): Pt will direct care for bed mobility in preparation for supine>sit EOB with mod quesitoning verbal cues OT Short Term Goal 3 (Week 2): Pt will recall correct placement of slide board with mod verbal cues  Skilled Therapeutic Interventions/Progress Updates:    Pt greeted seated in wc handoff from PT. Pt completed hand there-ex using soft yellow squeeze sponge with increased finger flexion noted in R UE today. Pt brought to the side of sink to allow for LE's to remain elevated. Engaged pt in grooming task focused on functional use of B UEs. Pt able to actively extend L UE almost to the sink with min guided A at elbow to get over sink ledge. R UE has more tone and needed max A to bring up to sink ledge. Pt then able to slowly bring hands together and rub together with L hand moving more than R. OT assisted with scrubbing for thoroughness to remove dead skin from hand. Pt requested water and administered 5cc x 3 with syringe without coughing. Pt left tiled in wc in care of nursing for medications.   Therapy Documentation Precautions:  Precautions Precautions: Fall, Cervical Required Braces or Orthoses: Cervical Brace Cervical Brace: Hard collar, At all times Restrictions Weight Bearing Restrictions: Yes RLE Weight Bearing: Non weight bearing LLE Weight Bearing: Non weight bearing \\Pain : Pain Assessment Pain Assessment: No/denies pain  See Function Navigator for Current Functional Status.   Therapy/Group: Individual Therapy  Valma Cava 06/06/2017, 9:59 AM

## 2017-06-06 NOTE — Progress Notes (Signed)
Physical Therapy Session Note  Patient Details  Name: [name] MRN: [id number] Date of Birth: [birth year]  Today's Date: 06/06/2017   Short Term Goals: Week 3:  PT Short Term Goal 1 (Week 3): Pt will demonstrate dynamic sitting balance x3 min on mat table with minA PT Short Term Goal 2 (Week 3): Pt will demonstrate rolling R/L maxA +1 PT Short Term Goal 3 (Week 3): Pt will direct care for set up of transfer with min cues   Skilled Therapeutic Interventions/Progress Updates:   Pt received supine in bed. Pt noted to be extremely fatigued from earlier treatment. Pt reports that he would like to rest for the remainder of the day, because he felt too tired for additional activity. Pt left in bed with call bell in reach.      Therapy Documentation Precautions:  Precautions Precautions: Fall, Cervical Required Braces or Orthoses: Cervical Brace Cervical Brace: Hard collar, At all times Restrictions Weight Bearing Restrictions: Yes RLE Weight Bearing: Non weight bearing LLE Weight Bearing: Non weight bearing Vital Signs: Therapy Vitals Temp: 98.2 F (36.8 C) Temp Source: Oral Pulse Rate: 92 Resp: 16 BP: 117/73 Patient Position (if appropriate): Lying Oxygen Therapy SpO2: 96 % O2 Device: Not Delivered  See Function Navigator for Current Functional Status.   Therapy/Group: Individual Therapy  Lorie Phenix 06/06/2017, 4:47 PM

## 2017-06-06 NOTE — Progress Notes (Signed)
Physical Therapy Session Note  Patient Details  Name: [name] MRN: [id number] Date of Birth: 1966/07/18  Today's Date: 06/06/2017 PT Individual Time: 9169-4503 PT Individual Time Calculation (min): 45 min    Skilled Therapeutic Interventions/Progress Updates:    Pt received sitting up in wheelchair, agreeable to therapy. Pt reported feeling much better today and motivated to participate in therapy. Pt total assist + 2 for sliding board transfer. Pt instructed in using trunk for scooting across sliding board. Pt able to perform scooting motion but unable to move across board. Pt tolerated activity for ~10 min before needing to lie back for rest break. Pt able to static sit unsupported on mat with supervision ~1-2 minutes. Pt able to use trunk to attempt recover from LOB with mod assist. Pt required max assist support for dynamic sitting activities of kicking soccer ball and gravity eliminated bicep/tricep ROM on table. Cues for keeping balance, upright posture, and technique. Pt returned to room total assist. Left sitting up in room chair, OT handoff.   Therapy Documentation Precautions:  Precautions Precautions: Fall, Cervical Required Braces or Orthoses: Cervical Brace Cervical Brace: Hard collar, At all times Restrictions Weight Bearing Restrictions: Yes RLE Weight Bearing: Non weight bearing LLE Weight Bearing: Non weight bearing  See Function Navigator for Current Functional Status.   Therapy/Group: Individual Therapy  Gwinda Passe, SPT 06/06/2017, 2:52 PM

## 2017-06-06 NOTE — Progress Notes (Signed)
Pt seen by trach team for follow up. All needed equipment needed at bedside. No education needed at this time. Will continue to follow for progression.

## 2017-06-06 NOTE — Progress Notes (Signed)
Left eye

## 2017-06-06 NOTE — Progress Notes (Addendum)
[place] PHYSICAL MEDICINE & REHABILITATION     PROGRESS NOTE    Subjective/Complaints: Feeling better. Happy that his wires out. Tolerating neck pain/headaches better.   ROS: pt denies nausea, vomiting, diarrhea, cough, shortness of breath or chest pain   Objective: Vital Signs: Blood pressure 126/74, pulse 80, temperature (!) 97.4 F (36.3 C), temperature source Oral, resp. rate 18, height 5\' 6"  (1.676 m), weight 90.7 kg (200 lb), SpO2 98 %. No results found. No results for input(s): WBC, HGB, HCT, PLT in the last 72 hours. No results for input(s): NA, K, CL, GLUCOSE, BUN, CREATININE, CALCIUM in the last 72 hours.  Invalid input(s): CO CBG (last 3)   Recent Labs  06/06/17 0008 06/06/17 0439 06/06/17 0802  GLUCAP 102* 111* 120*    Wt Readings from Last 3 Encounters:  06/06/17 90.7 kg (200 lb)  05/21/17 99 kg (218 lb 3.2 oz)  01/11/17 91.6 kg (202 lb)    Physical Exam:  Constitutional: He appears well-developedand well-nourished. He is in no distress.  HENT:  Head: Normocephalicand atraumatic.   Eyes:  Unable to move right eye laterally. Left eye remains injected and limited to lateral field--- no changes in appearance of eye Neck: phonation better Immobilized by Aspen collar.head more neutral.  Cardiovascular:. RRR without murmur. No JVD      Respiratory: CTA Bilaterally without wheezes or rales. Normal effort  GI: Soft. NT, PEG site in place.  Genitourinary:  Genitourinary Comments: foley remains in place  Musculoskeletal: He exhibits continued  bruising left knee wihtout change  left thigh  dressing on left hip. Left ankle dressed. some knee movement. Minimal to no ADF/PF that I saw LLE.  Neurological:    Motor 1-2/5 UE and LE's, other than above   Senses pain stim in all 4. . Left III, VI nerve weakness but seems to have some movement. Right VI deficit. .Oriented to person and place and reason here. More attentive, better insight Skin: Skin is warmand  dry.  Wounds all dry/healing Psychiatric: pleasant   Assessment/Plan: 1. Functional deficits, tetraplegia secondary to TBI/cervical SCI which require 3+ hours per day of interdisciplinary therapy in a comprehensive inpatient rehab setting. Physiatrist is providing close team supervision and 24 hour management of active medical problems listed below. Physiatrist and rehab team continue to assess barriers to discharge/monitor patient progress toward functional and medical goals.  Function:  Bathing Bathing position      Bathing parts   Body parts bathed by helper: Left arm, Right arm, Chest, Abdomen, Front perineal area, Buttocks, Right upper leg, Left upper leg, Right lower leg, Left lower leg  Bathing assist Assist Level: 2 helpers      Upper Body Dressing/Undressing Upper body dressing   What is the patient wearing?: Hospital gown                Upper body assist Assist Level:  (total A)      Lower Body Dressing/Undressing Lower body dressing   What is the patient wearing?: Pants                              Lower body assist Assist for lower body dressing: 2 Helpers      Toileting Toileting Toileting activity did not occur: No continent bowel/bladder event   Toileting steps completed by helper: Adjust clothing prior to toileting, Performs perineal hygiene, Adjust clothing after toileting    Toileting assist Assist level: Touching  or steadying assistance (Pt.75%)   Transfers Chair/bed transfer   Chair/bed transfer method: Lateral scoot Chair/bed transfer assist level: 2 helpers Chair/bed transfer assistive device: Sliding board Mechanical lift: Maximove   Locomotion Ambulation Ambulation activity did not occur: Safety/medical Editor, commissioning activity did not occur: Safety/medical concerns Type: Manual Max wheelchair distance: 300+ ft Assist Level: Dependent (Pt equals 0%)  Cognition Comprehension Comprehension  assist level: Follows basic conversation/direction with extra time/assistive device  Expression Expression assist level: Expresses basic 25 - 49% of the time/requires cueing 50 - 75% of the time. Uses single words/gestures.  Social Interaction Social Interaction assist level: Interacts appropriately 75 - 89% of the time - Needs redirection for appropriate language or to initiate interaction.  Problem Solving Problem solving assist level: Solves basic 50 - 74% of the time/requires cueing 25 - 49% of the time  Memory Memory assist level: Recognizes or recalls 75 - 89% of the time/requires cueing 10 - 24% of the time   Medical Problem List and Plan: 1. Functional, cognitive and mobility deficitssecondary to Moderate TBI, C4 spinal cord injury -CIR PT, OT, speech---continue as tolerated  -still requiring a lot of care. Reviewed numerous issues that we're working through with the patient this morning.  2. DVT Prophylaxis/Anticoagulation: Pharmaceutical: Lovenox 3. Chronic neck pain/Pain Management: On Gabapentin bid   -changed hydrocodone to oxycodone for breakthrough pain  - fentanyl patch 78mcg/hr with improved pain  -overall pain is better, he's adjusting to it       -left knee pain, likely soft tissue, ligamentous. Wrap/support as able   -outpt follow up  -scheduled robaxin for cervicalgia/dystonia, cervical rom/collar  -Headaches are primarly related to occipita-cervical injury 4. Mood: LCSW to follow for evaluation and support when appropriate.  5. Neuropsych: This patient is notcapable of making decisions on hisown behalf.  -continue deficits in memory/insight 6. Skin/Wound Care: Air mattress overlay. Maintain adequate nutritional and hydration status.  7. Fluids/Electrolytes/Nutrition: Monitor I/O.   -continue TF but reduce rate   -begin full liquid diet 9. Left periorbital cellulitis/ CN III/VI palsy:  -continue local care  -follow up per optho 10Left femur  fracture s/p IM nail: NWB X 8 weeks.has left foot drop 11. Left open tibial fracture s/p I and D with ORIF: NWB--convert to CAM boot? Follow up with ortho.  11. Left mandibular fracture s/p CR /tongue laceration: Downsized to CFS # 4 on 9/17 -wires removed, screws in place for two more weeks from 9/26. No chewing  -plug trach today 12. Chemosis left eye: Continue Lacrilube every 3 hours till resolved per opthalmology.  -having some photosensitivity. Eye itself is showing some improvement but minimal---consider optho follow up 13. Facial cellulitis: keflex was completed 14. Leucocytosis: Monitor for signs of infection. 15. Thrombocytosis: Likely reactive. Dopplers pending.   16. ABLA: hgb stable at 10.5 17. Abnormal LFTs: Question due to shocked liver.  41. H/o of MDD: Was on Trintellix 5 mg and Risperidone 5 mg bid at home--continue to hold for now.   -team follow. Neuro-psych has seen patient and is following 19. Urine retention/neurogenic bladder: foley replaced and left in place due to risk of skin breakdown and pt's poor mobility 20. ID/septicemia:  -Left lower lobe infiltrate on CXR, wbc's 16k on 9/22---down to 4.6 9/24  -afebrile   -ucx with 100k E Coli  -converted to bactrim today to complete course  -blood cultures negative so far.   -off IVF    LOS (  Days) 16 A FACE TO FACE EVALUATION WAS PERFORMED  Meredith Staggers, MD 06/06/2017 9:40 AM

## 2017-06-06 NOTE — Progress Notes (Signed)
Speech Language Pathology Daily Session Note  Patient Details  Name: [name] MRN: [id number] Date of Birth: [birth year]  Today's Date: 06/06/2017 SLP Individual Time: 1300-1340 SLP Individual Time Calculation (min): 40 min  Short Term Goals: Week 2: SLP Short Term Goal 1 (Week 2): Pt will utilize increased vocal intensity and slow rate to achieve intelligibility at the phrase  to sentence level with min assist verbal cues.   SLP Short Term Goal 2 (Week 2): Pt will identify at least 2 new deficits and their impact on his functional independence with mod assist verbal cues.   SLP Short Term Goal 3 (Week 2): Pt will verbalize at least 2 safety precautions with mod assist verbal cues.   SLP Short Term Goal 4 (Week 2): Pt will direct self care by answering questions regarding PO intake (i.e., amount of intake, cup vs spoon) with Mod A cues.   Skilled Therapeutic Interventions: Skilled treatment session focused on dysphagia goals. Upon arrival, PMSV was plugged and patient's mandibular wires have been removed. Patient consumed trials of thin liquids via tsp and straw without overt s/s of aspiration but intermittent discoordination. Patient also consumed half tsp of puree with decreased oral manipulation and suspected delayed swallow initiation without overt s/s of aspiration. However, as trials progressed, patient's vocal quality softened and appeared more strained. Suspect due to fatigue. Recommend continued trials with SLP only and with trial tray of a full liquid diet tomorrow to assess readiness for diet initiation. Spent extensive time educating the patient and his mother in regards to his current swallowing function and goals of skilled SLP intervention. Patient left upright in bed with mother present. Continue with current plan of care.      Function:  Eating Eating   Modified Consistency Diet: Yes Eating Assist Level: Helper feeds patient (with trials from SLP )            Cognition Comprehension Comprehension assist level: Follows basic conversation/direction with extra time/assistive device  Expression   Expression assist level: Expresses basic 25 - 49% of the time/requires cueing 50 - 75% of the time. Uses single words/gestures.  Social Interaction Social Interaction assist level: Interacts appropriately 75 - 89% of the time - Needs redirection for appropriate language or to initiate interaction.  Problem Solving Problem solving assist level: Solves basic 50 - 74% of the time/requires cueing 25 - 49% of the time  Memory Memory assist level: Recognizes or recalls 50 - 74% of the time/requires cueing 25 - 49% of the time    Pain No/Denies Pain   Therapy/Group: Individual Therapy  Salome Cozby 06/06/2017, 3:24 PM

## 2017-06-06 NOTE — Progress Notes (Signed)
Occupational Therapy Session Note  Patient Details  Name: [name] MRN: [id number] Date of Birth: 02/08/1966  Today's Date: 06/06/2017 OT Individual Time: 0700-0800 OT Individual Time Calculation (min): 60 min    Short Term Goals: Week 2:  OT Short Term Goal 1 (Week 2): Pt will engage in static sitting for 3 minutes with  mod A or less for sitting balance.  OT Short Term Goal 2 (Week 2): Pt will direct care for bed mobility in preparation for supine>sit EOB with mod quesitoning verbal cues OT Short Term Goal 3 (Week 2): Pt will recall correct placement of slide board with mod verbal cues  Skilled Therapeutic Interventions/Progress Updates:    Pt resting in bed upon arrival.  Focus on bed mobility, sitting balance, slide board transfers, and BUE PROM/AAROM.  Pt initiates moving RLE over to edge of bed pushing through LUE to sit EOB.  Pt able to maintain static sitting balance at close supervision level.  Pt required max a for slide board transfer to w/c.  Pt remained in w/c with soft call bell placed in L hand and TV turned on at pt's request. RN aware that pt is up in w/c beside bed.    Therapy Documentation Precautions:  Precautions Precautions: Fall, Cervical Required Braces or Orthoses: Cervical Brace Cervical Brace: Hard collar, At all times Restrictions Weight Bearing Restrictions: Yes RLE Weight Bearing: Non weight bearing LLE Weight Bearing: Non weight bearing General:   Vital Signs: Therapy Vitals Pulse Rate: 89 Resp: 18 Patient Position (if appropriate): Sitting Oxygen Therapy SpO2: 96 % O2 Device: Not Delivered Pain:   ADL:   Vision   Perception    Praxis   Exercises:   Other Treatments:    See Function Navigator for Current Functional Status.   Therapy/Group: Individual Therapy  Leroy Libman 06/06/2017, 11:56 AM

## 2017-06-06 NOTE — Progress Notes (Signed)
Discussed patient with Dr. Valetta Close and set up telephone visit with patient. Picture of eye posted in Epic but MD unable to view this. He talked with patient who reported pain only with bright lights and movement of eyes to the right and blurry vision which patient felt was due to lack of his eye glasses.  Discussed that minimal edema present, proptosis has resolved--he is now able to close left eye and erythema have greatly improved. No drainage noted. Patient's wife to bring glasses in today and patient advised that his vision may still be blurry.   Dr. Valetta Close recommended continuing Lacrilube and eye drops for to maintain adequate moisture as well as use of glasses--he doubted iritis which can sometimes be a complication. He recommended monitoring and follow up in office next week if symptoms worsen or at discharge.

## 2017-06-07 ENCOUNTER — Ambulatory Visit (HOSPITAL_COMMUNITY): Payer: Self-pay | Admitting: Speech Pathology

## 2017-06-07 ENCOUNTER — Inpatient Hospital Stay (HOSPITAL_COMMUNITY): Payer: Self-pay | Admitting: Speech Pathology

## 2017-06-07 ENCOUNTER — Inpatient Hospital Stay (HOSPITAL_COMMUNITY): Payer: Self-pay | Admitting: Occupational Therapy

## 2017-06-07 ENCOUNTER — Inpatient Hospital Stay (HOSPITAL_COMMUNITY): Payer: Self-pay | Admitting: Physical Therapy

## 2017-06-07 LAB — AEROBIC/ANAEROBIC CULTURE W GRAM STAIN (SURGICAL/DEEP WOUND)
Gram Stain: NONE SEEN
Special Requests: NORMAL

## 2017-06-07 LAB — AEROBIC/ANAEROBIC CULTURE (SURGICAL/DEEP WOUND): CULTURE: NO GROWTH

## 2017-06-07 LAB — GLUCOSE, CAPILLARY
GLUCOSE-CAPILLARY: 105 mg/dL — AB (ref 65–99)
GLUCOSE-CAPILLARY: 166 mg/dL — AB (ref 65–99)
GLUCOSE-CAPILLARY: 125 mg/dL — AB (ref 65–99)
Glucose-Capillary: 117 mg/dL — ABNORMAL HIGH (ref 65–99)
Glucose-Capillary: 86 mg/dL (ref 65–99)

## 2017-06-07 MED ORDER — ENSURE ENLIVE PO LIQD
237.0000 mL | Freq: Three times a day (TID) | ORAL | Status: DC
  Administered 2017-06-07 – 2017-07-17 (×87): 237 mL via ORAL

## 2017-06-07 MED ORDER — JEVITY 1.5 CAL/FIBER PO LIQD
1000.0000 mL | ORAL | Status: DC
  Filled 2017-06-07: qty 1000

## 2017-06-07 MED ORDER — JEVITY 1.5 CAL/FIBER PO LIQD
1000.0000 mL | ORAL | Status: DC
  Administered 2017-06-08 – 2017-06-15 (×5): 1000 mL
  Filled 2017-06-07 (×9): qty 1000

## 2017-06-07 NOTE — Progress Notes (Signed)
Occupational Therapy Weekly Progress Note  Patient Details  Name: [name] MRN: [id number] Date of Birth: [birth year]  Beginning of progress report period: 05/29/2017 End of progress report period: 06/07/2017  Today's Date: 06/07/2017 OT Individual Time: 8295-6213 OT Individual Time Calculation (min): 65 min  25 minutes missed OT time due to fatigue  Patient has met 3 of 3 short term goals.     Patient has made progress with STG achievement during this report period Though he still requires Total to +2 assist for self care tasks and functional transfers, he is gaining UE ROM in gravity eliminated positions and bilateral grasp strength. Today pt was active with providing care direction during functional tasks bedlevel, and also maintained sitting balance for approx 3 minutes with intermittent Mod A in prep for the slideboard transfer. Pt continues to be limited by pain and fatigue, however, he exhibits motivation to regain function and participate to the best of his abilities in tx sessions.    Patient continues to demonstrate the following deficits: muscle weakness and muscle paralysis, decreased cardiorespiratoy endurance, impaired timing and sequencing, abnormal tone, unbalanced muscle activation and decreased coordination, visual deficits, decreased midline orientation, decreased awareness, decreased problem solving and decreased safety awareness and decreased sitting balance, decreased standing balance, decreased postural control and decreased balance strategies and therefore will continue to benefit from skilled OT intervention to enhance overall performance with BADL.  Patient progressing toward long term goals..  Continue plan of care.  OT Short Term Goals Week 2:  OT Short Term Goal 1 (Week 2): Pt will engage in static sitting for 3 minutes with  mod A or less for sitting balance.  OT Short Term Goal 1 - Progress (Week 2): Met OT Short Term Goal 2 (Week 2): Pt will direct  care for bed mobility in preparation for supine>sit EOB with mod quesitoning verbal cues OT Short Term Goal 2 - Progress (Week 2): Met OT Short Term Goal 3 (Week 2): Pt will recall correct placement of slide board with mod verbal cues OT Short Term Goal 3 - Progress (Week 2): Met Week 3:  OT Short Term Goal 1 (Week 3): Pt will maintain a functional grasp during 1 grooming task for 1 minute OT Short Term Goal 2 (Week 3): Pt will complete 5% of lifting pants over upper legs during dressing without physical assist from helper OT Short Term Goal 3 (Week 3): Pt will complete 1 ADL task EOB for 4 minutes to improve upright tolerance   Skilled Therapeutic Interventions/Progress Updates:    Pt lying in bed at time of arrival, reporting significant fatigue and requesting for OT to return in 30 minutes after resting. OT returned 30 minutes later to proceed with tx. Pt requesting to dress and complete grooming tasks.   Tx focus UE AROM/ strengthening, trunk control, and functional transfers while adhering to precautions.   HOH bilateral UE guidance for washing Rt/Lt aspects of face. Thorough education provided on mental rehearsal for NMR at this time. Pt exhibiting weak gross grasp bilaterally for holding wash cloth, Lt hand stronger than Rt. Pt engaging in UB/LB dressing while HOB lowered to 30 degrees. Once pants were threaded, pt assisting with elevating waist of pants over hips with HOH guidance. Pt rolling R>L with 2 helpers (for perihygiene and lifting shorts over hips completely) HOH for maintaining hand grip on bedrails. Educated pt that he could not bridge at this time due to NWB precautions. 2 helpers for long sitting to don  button up shirt. Pt pulling himself forward with bilateral grasp facilitated by OT and helper. Slideboard transfer completed to w/c with bed elevated to keep LEs off of floor (for NWB). Pt requiring 2 helpers and cues for technique. While in w/c, oral care completed with  sponge/yonker (pt able to grip with Lt hand, HOH for elevating limb to mouth). Pt was repositioned with 2 helpers for comfort and safety in w/c, reported he wanted to take a quick ride to the gym. Escorted him short distance around unit for social participation/psychosocial wellness. Pt returned to room and left reclined in TIS with all needs within reach at time of departure (and bilateral CAM boots donned).    Therapy Documentation Precautions:  Precautions Precautions: Fall, Cervical Required Braces or Orthoses: Cervical Brace Cervical Brace: Hard collar, At all times Restrictions Weight Bearing Restrictions: Yes RLE Weight Bearing: Non weight bearing LLE Weight Bearing: Non weight bearing Pain: Pain Assessment Pain Assessment: 0-10 Pain Score: 2  Pain Type: Acute pain Pain Location: Head Pain Orientation: Other (Comment) (top, lateral) Pain Descriptors / Indicators: Aching Pain Onset: On-going Patients Stated Pain Goal: 2 Pain Intervention(s): Medication (See eMAR) (Acetaminophen) ADL:      See Function Navigator for Current Functional Status.   Therapy/Group: Individual Therapy   A  06/07/2017, 12:42 PM  

## 2017-06-07 NOTE — Progress Notes (Signed)
Speech Language Pathology Weekly Progress and Session Note  Patient Details  Name: [name] MRN: [id number] Date of Birth: 12/26/1965  Beginning of progress report period: May 31, 2017 End of progress report period: June 07, 2017  Today's Date: 06/07/2017   Skilled treatment session #1 SLP Individual Time: 0800-0900 SLP Individual Time Calculation (min): 60 min   Skilled treatment session #2 SLP Individual Time: 1030-1100 SLP Individual Time Calculation (min): 30 min  Short Term Goals: Week 2: SLP Short Term Goal 1 (Week 2): Pt will utilize increased vocal intensity and slow rate to achieve intelligibility at the phrase  to sentence level with min assist verbal cues.   SLP Short Term Goal 1 - Progress (Week 2): Met SLP Short Term Goal 2 (Week 2): Pt will identify at least 2 new deficits and their impact on his functional independence with mod assist verbal cues.   SLP Short Term Goal 2 - Progress (Week 2): Not met SLP Short Term Goal 3 (Week 2): Pt will verbalize at least 2 safety precautions with mod assist verbal cues.   SLP Short Term Goal 3 - Progress (Week 2): Not met SLP Short Term Goal 4 (Week 2): Pt will direct self care by answering questions regarding PO intake (i.e., amount of intake, cup vs spoon) with Mod A cues.  SLP Short Term Goal 4 - Progress (Week 2): Not met    New Short Term Goals: Week 3: SLP Short Term Goal 1 (Week 3): Pt will identify at least 2 new deficits and their impact on his functional independence with mod assist verbal cues.   SLP Short Term Goal 2 (Week 3): Pt will verbalize at least 2 safety precautions with mod assist verbal cues.   SLP Short Term Goal 3 (Week 3): Pt will direct self care by answering questions regarding PO intake (i.e., amount of intake, cup vs spoon) with Mod A cues.  SLP Short Term Goal 4 (Week 3): Pt will consume full liquid diet with Min overt s/s of aspiration and Min A cues for use of compensatory  strategies.   Weekly Progress Updates: Pt with minimal progress this week d/t issues with pain, light sensitivity and poor task tolerance. Pt has made progress in the area of dysphagia and is currently on full liquid diet. He has the wires removed from his jaw but continues a "nonchew" diet for another 2 weeks. Skilled ST continues to be required to address cognitive deficits and dysphagia. Anticipate that pt may need follow up services at SNF.      Intensity: Minumum of 1-2 x/day, 30 to 90 minutes Frequency: 3 to 5 out of 7 days Duration/Length of Stay: 3 weeks Treatment/Interventions: Cognitive remediation/compensation;Cueing hierarchy;Dysphagia/aspiration precaution training;Functional tasks;Patient/family education;Internal/external aids;Speech/Language facilitation   Daily Session  Skilled Therapeutic Interventions:   Skilled treatment session #1 - focused on dysphagia, self-care and cognition goals. Lurline Idol is presently capped and on room air. Pt's vitals remained stable throughout consumption with O2 remaining at 94%. SLP facilitated session by providing skilled observation of pt consuming full liquid breakfast tray. Pt with cough x 1 and several occasions of multiple swallows with thin liquids via straw. On previous MBS, pt able to contian small amounts of liquids within pyriform sinuses and at bedside pt appears to consume larger boluses via straw. Pt with no overt s/s of aspiration when liquids were consumed in session by spoon. Recommend allowing full liquid diet with thin liquids BY SPOON only, full nursing supervision, must be upright  and have oral care before PO intake. Education was provided to pt and posted throughout room for continuous reminders of precautions. SLP also facilitated session by administering [place] Blind. Pt obtained score of 16 out of 22 with n=>18. Pt with deficits in higher level abstract processing. Pt was left upright in bed with all needs within reach.   Skilled  treatment session #2 - focused on cognition and dysphagia goals. SLP facilitated session by providing question cues for pt to recall swallow precautions. Pt able to recall need for spoon and direct nursing supervision. SLP further facilitated session by providing skilled observation of pt consuming thin water via spoon. Pt consumed without overt s/s of aspiration.  SLP made aware that pt is constantly requesting nursing to allow him to drink through a straw but doesn't want ST consulted to explain precautions. Given that pt is able to recall precautions with nursing and ST, it appears that pt may be behaviorally manipulating presentation of liquids. Education provided to continue liquids by spoon.    Function:   Eating Eating   Modified Consistency Diet: Yes Eating Assist Level: Helper feeds patient           Cognition Comprehension Comprehension assist level: Follows basic conversation/direction with extra time/assistive device;Understands basic 90% of the time/cues < 10% of the time  Expression Expression assistive device: Talk trach valve Expression assist level: Expresses basic 50 - 74% of the time/requires cueing 25 - 49% of the time. Needs to repeat parts of sentences.  Social Interaction Social Interaction assist level: Interacts appropriately 75 - 89% of the time - Needs redirection for appropriate language or to initiate interaction.  Problem Solving Problem solving assist level: Solves basic 50 - 74% of the time/requires cueing 25 - 49% of the time;Solves basic 75 - 89% of the time/requires cueing 10 - 24% of the time  Memory Memory assist level: Recognizes or recalls 50 - 74% of the time/requires cueing 25 - 49% of the time;Recognizes or recalls 75 - 89% of the time/requires cueing 10 - 24% of the time   General    Pain Pain Assessment Pain Assessment: 0-10 Pain Score: 2  Pain Type: Acute pain Pain Location: Head Pain Orientation: Other (Comment) (top, lateral) Pain  Descriptors / Indicators: Aching Pain Onset: On-going Patients Stated Pain Goal: 2 Pain Intervention(s): Medication (See eMAR) (Acetaminophen)  Therapy/Group: Individual Therapy  [name] 06/07/2017, 12:48 PM

## 2017-06-07 NOTE — Progress Notes (Signed)
Physical Therapy Session Note  Patient Details  Name: [name] MRN: [id number] Date of Birth: April 14, 1966  Today's Date: 06/07/2017 PT Individual Time: 1735-6701 PT Individual Time Calculation (min): 90 min   Short Term Goals: Week 3:  PT Short Term Goal 1 (Week 3): Pt will demonstrate dynamic sitting balance x3 min on mat table with minA PT Short Term Goal 2 (Week 3): Pt will demonstrate rolling R/L maxA +1 PT Short Term Goal 3 (Week 3): Pt will direct care for set up of transfer with min cues   Skilled Therapeutic Interventions/Progress Updates: Pt received seated in w/c, c/o headache 8/10 and RN present to administer medication via PEG at beginning of session. Scooted pt back in chair totalA +2. Performed BUE/BLE AAROM while waiting for medication to be prepared; note improving activation throughout extremities and improving effort from pt as well with trials. Pt able to demonstrate assisting with scooting hips forward/backward in chair before/after transfer. Transfer requires +2 maxA with transfer board. Sitting balance min guard overall on edge of mat table while engaged in BUE stability ball push/pull, BUE cup passing in gravity eliminated position on table top. Pt demonstrates significant difficulty with each activity on first one to two trials, then significantly improves speed, coordination of movements with repetition. Pt requests cessation of sitting activities d/t increasing neck/shoulder pain. Returned to bed maxA +2 with transfer board. Sit >supine totalA +2. BLE/BUE PROM performed and pt educated on role of maintaining muscle length in carry over into functional activities. Remained supine in bed at end of session, all needs in reach.      Therapy Documentation Precautions:  Precautions Precautions: Fall, Cervical Required Braces or Orthoses: Cervical Brace Cervical Brace: Hard collar, At all times Restrictions Weight Bearing Restrictions: Yes RLE Weight Bearing:  Non weight bearing LLE Weight Bearing: Non weight bearing  See Function Navigator for Current Functional Status.   Therapy/Group: Individual Therapy  [name] 06/07/2017, 2:28 PM

## 2017-06-07 NOTE — Progress Notes (Signed)
[facility] PHYSICAL MEDICINE & REHABILITATION     PROGRESS NOTE    Subjective/Complaints: Up in bed. Feeling better. Anxious to eat. Pain under reasonable control. No breathing issues. Tolerated trach plug well  ROS: pt denies nausea, vomiting, diarrhea, cough, shortness of breath or chest pain    Objective: Vital Signs: Blood pressure 105/73, pulse 80, temperature 98.4 F (36.9 C), temperature source Oral, resp. rate 16, height 5\' 6"  (1.676 m), weight 90.9 kg (200 lb 6.4 oz), SpO2 98 %. No results found. No results for input(s): WBC, HGB, HCT, PLT in the last 72 hours. No results for input(s): NA, K, CL, GLUCOSE, BUN, CREATININE, CALCIUM in the last 72 hours.  Invalid input(s): CO CBG (last 3)   Recent Labs  06/06/17 2352 06/07/17 0316 06/07/17 0801  GLUCAP 104* 105* 166*    Wt Readings from Last 3 Encounters:  06/07/17 90.9 kg (200 lb 6.4 oz)  05/21/17 99 kg (218 lb 3.2 oz)  01/11/17 91.6 kg (202 lb)    Physical Exam:  Constitutional: He appears well-developedand well-nourished. He is in no distress.  HENT:  Head: Normocephalicand atraumatic.   Eyes:  Unable to move right eye laterally. Left eye remains injected and limited to lateral field--- left eye less injected Neck: strong phonation. Trach plugged.  Immobilized by Aspen collar.head more neutral.  Cardiovascular:. RRR without murmur. No JVD       Respiratory: CTA Bilaterally without wheezes or rales. Normal effort   GI: Soft. NT, PEG site in place and clean Genitourinary:  Genitourinary Comments: foley remains in place  Musculoskeletal: He exhibits continued  bruising left knee wihtout change  left thigh/leg wrapped, no drainage.  Neurological:    Motor 1-2/5 UE and LE's, other than above. Trace to absent movement left ankle.   Senses pain stim in all 4. . Left III, VI nerve weakness but seems to have some movement. Right VI deficit. .Oriented to person and place and reason here. More attentive, better  insight Skin: Skin is warmand dry.  Wounds all dry/healing Psychiatric: pleasant   Assessment/Plan: 1. Functional deficits, tetraplegia secondary to TBI/cervical SCI which require 3+ hours per day of interdisciplinary therapy in a comprehensive inpatient rehab setting. Physiatrist is providing close team supervision and 24 hour management of active medical problems listed below. Physiatrist and rehab team continue to assess barriers to discharge/monitor patient progress toward functional and medical goals.  Function:  Bathing Bathing position      Bathing parts   Body parts bathed by helper: Left arm, Right arm, Chest, Abdomen, Front perineal area, Buttocks, Right upper leg, Left upper leg, Right lower leg, Left lower leg  Bathing assist Assist Level: 2 helpers      Upper Body Dressing/Undressing Upper body dressing   What is the patient wearing?: Hospital gown                Upper body assist Assist Level:  (total A)      Lower Body Dressing/Undressing Lower body dressing   What is the patient wearing?: Pants                              Lower body assist Assist for lower body dressing: 2 Helpers      Toileting Toileting Toileting activity did not occur: No continent bowel/bladder event   Toileting steps completed by helper: Adjust clothing prior to toileting, Performs perineal hygiene, Adjust clothing after toileting  Toileting assist Assist level: Touching or steadying assistance (Pt.75%)   Transfers Chair/bed transfer   Chair/bed transfer method: Lateral scoot Chair/bed transfer assist level: 2 helpers Chair/bed transfer assistive device: Sliding board Mechanical lift: Maximove   Locomotion Ambulation Ambulation activity did not occur: Safety/medical Editor, commissioning activity did not occur: Safety/medical concerns Type: Manual Max wheelchair distance: 300+ ft Assist Level: Dependent (Pt equals 0%)   Cognition Comprehension Comprehension assist level: Follows basic conversation/direction with extra time/assistive device  Expression Expression assist level: Expresses basic 25 - 49% of the time/requires cueing 50 - 75% of the time. Uses single words/gestures.  Social Interaction Social Interaction assist level: Interacts appropriately 75 - 89% of the time - Needs redirection for appropriate language or to initiate interaction.  Problem Solving Problem solving assist level: Solves basic 50 - 74% of the time/requires cueing 25 - 49% of the time  Memory Memory assist level: Recognizes or recalls 50 - 74% of the time/requires cueing 25 - 49% of the time   Medical Problem List and Plan: 1. Functional, cognitive and mobility deficitssecondary to Moderate TBI, C4 spinal cord injury -CIR PT, OT, speech---continue as tolerated  -displaying improved insight and awarenesss  2. DVT Prophylaxis/Anticoagulation: Pharmaceutical: Lovenox 3. Chronic neck pain/Pain Management: On Gabapentin bid   -changed hydrocodone to oxycodone for breakthrough pain  - fentanyl patch 42mcg/hr with improved pain  -pain control improved      -left knee pain, likely soft tissue, ligamentous. Wrap/support as able   -outpt follow up  -scheduled robaxin for cervicalgia/dystonia, cervical rom/collar  -Headaches are primarly related to occipita-cervical injury 4. Mood: LCSW to follow for evaluation and support when appropriate.  5. Neuropsych: This patient is notcapable of making decisions on hisown behalf.  -continue deficits in memory/insight 6. Skin/Wound Care: Air mattress overlay. Maintain adequate nutritional and hydration status.  7. Fluids/Electrolytes/Nutrition: Monitor I/O.   -continue TF but reduce rate   -begin full liquid diet 9. Left periorbital cellulitis/ CN III/VI palsy:  -continue local care  -follow up per optho 10Left femur fracture s/p IM nail: NWB X 8 weeks.has left foot drop 11.  Left open tibial fracture s/p I and D with ORIF: NWB--convert to CAM boot? Follow up with ortho.  11. Left mandibular fracture s/p CR /tongue laceration: Downsized to CFS # 4 on 9/17 -wires removed, screws in place for two more weeks from 9/26. No chewing  -trach plugged---consider decannulation Sunday or monday 12. Chemosis left eye: Continue Lacrilube every 3 hours till resolved per opthalmology.  -having some photosensitivity. Eye itself is showing gradual improvement. Optho recommends ongoing consvt mgt 13. Facial cellulitis: keflex was completed 14. Leucocytosis: Monitor for signs of infection. 15. Thrombocytosis: Likely reactive. Dopplers pending.   16. ABLA: hgb stable at 10.5 17. Abnormal LFTs: Question due to shocked liver.  26. H/o of MDD: Was on Trintellix 5 mg and Risperidone 5 mg bid at home--continue to hold for now.   -team follow. Neuro-psych has seen patient and is following 19. Urine retention/neurogenic bladder: foley replaced and left in place due to risk of skin breakdown and pt's poor mobility 20. ID/septicemia:  -Left lower lobe infiltrate on CXR, wbc's 16k on 9/22---down to 4.6 9/24  -afebrile   -ucx with 100k E Coli  -converted to bactrim today to complete course  -blood cultures negative so far.   -off IVF    LOS (Days) 17 A FACE TO FACE EVALUATION WAS PERFORMED  Conard Alvira  T, MD 06/07/2017 8:47 AM

## 2017-06-07 NOTE — Progress Notes (Signed)
Social Work Patient ID: Dash Cardarelli, male   DOB: Aug 11, 1966, 51 y.o.   MRN: [id number]   Spoke briefly with pt following team conference.  He is aware that we continue to project ~ 3 more weeks on CIR.  Pt continues to be very impaired  physically and this is definitely a rough estimate.  Pt is happy to have had wires removed and hopes to begin a diet soon. Spoke with pt's wife at length on the phone yesterday afternoon.  Reviewing team report of limited gains this week and his extensive care needs.  Discussed, again, anticipated care needs for pt upon d/c and pushed her further on her plan.  She reports that she does not expect pt's family to assist her in his care at d/c.  She notes she does NOT have a 24/7 care plan in place, however, is "familiar enough" with SNF care that she does not wish him to go to a SNF.  Explained that I do not have other options for her unless she can afford to hire assist or  can afford to provide the care herself.  Attempted to provide support on this very difficult decision and explained that SNF may be best option after CIR in order to continue 24/7 nursing care.  Will continue to work with pt and wife but will also begin to speak with some local SNFs to see if any can consider taking pt as a LOG (if hospital will approve use of this).  Khadijah Mastrianni, LCSW
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
## 2017-06-08 ENCOUNTER — Inpatient Hospital Stay (HOSPITAL_COMMUNITY): Payer: Self-pay | Admitting: Occupational Therapy

## 2017-06-08 LAB — GLUCOSE, CAPILLARY
GLUCOSE-CAPILLARY: 100 mg/dL — AB (ref 65–99)
Glucose-Capillary: 93 mg/dL (ref 65–99)

## 2017-06-08 MED ORDER — BISACODYL 10 MG RE SUPP
10.0000 mg | Freq: Every day | RECTAL | Status: DC
  Administered 2017-06-09 – 2017-07-19 (×14): 10 mg via RECTAL
  Filled 2017-06-08 (×34): qty 1

## 2017-06-08 NOTE — Progress Notes (Signed)
[facility]IC[facility] RE[facility]ILITATION     PROGRESS NOTE    Subjective/Complaints: Up in bed. Feeling better. Anxious to eat. Pain under reasonable control. No breathing issues. Tolerated trach plug well  ROS: pt denies nausea, vomiting, diarrhea, cough, shortness of breath or chest pain    Objective: Vital Signs: Blood pressure 116/65, pulse 90, temperature 98.6 F (37 C), temperature source Axillary, resp. rate 18, height 5\' 6"  (1.676 m), weight 91.8 kg (202 lb 4.8 oz), SpO2 99 %. No results found. No results for input(s): WBC, HGB, HCT, PLT in the last 72 hours. No results for input(s): NA, K, CL, GLUCOSE, BUN, CREATININE, CALCIUM in the last 72 hours.  Invalid input(s): CO CBG (last 3)   Recent Labs  06/07/17 1653 06/07/17 1958 06/08/17 0016  GLUCAP 86 125* 93    Wt Readings from Last 3 Encounters:  06/08/17 91.8 kg (202 lb 4.8 oz)  05/21/17 99 kg (218 lb 3.2 oz)  01/11/17 91.6 kg (202 lb)    Physical Exam:  Constitutional: He appears well-developedand well-nourished. He is in no distress.  HENT:  Head: Normocephalicand atraumatic.   Eyes:  Unable to move right eye laterally. Left eye remains injected and limited to lateral field--- left eye less injected Neck: strong phonation. Trach plugged.  Immobilized by Aspen collar.head more neutral.  Cardiovascular:. RRR without murmur. No JVD       Respiratory: CTA Bilaterally without wheezes or rales. Normal effort   GI: Soft. NT, PEG site in place and clean Genitourinary:  Genitourinary Comments: foley remains in place  Musculoskeletal: He exhibits continued  bruising left knee wihtout change  left thigh/leg wrapped, no drainage.  Neurological:    Motor 1-2/5 UE and LE's, other than above. Trace to absent movement left ankle.   Senses pain stim in all 4. . Left III, VI nerve weakness but seems to have some movement. Right VI deficit. .Oriented to person and place and reason here. More attentive, better  insight Skin: Skin is warmand dry.  Wounds all dry/healing Psychiatric: pleasant   Assessment/Plan: 1. Functional deficits, tetraplegia secondary to TBI/cervical SCI which require 3+ hours per day of interdisciplinary therapy in a comprehensive inpatient rehab setting. Physiatrist is providing close team supervision and 24 hour management of active medical problems listed below. Physiatrist and rehab team continue to assess barriers to discharge/monitor patient progress toward functional and medical goals.  Function:  Bathing Bathing position      Bathing parts   Body parts bathed by helper: Left arm, Right arm, Chest, Abdomen, Front perineal area, Buttocks, Right upper leg, Left upper leg, Right lower leg, Left lower leg  Bathing assist Assist Level: 2 helpers      Upper Body Dressing/Undressing Upper body dressing   What is the patient wearing?: Button up shirt           Button up shirt - Perfomed by helper: Thread/unthread right sleeve, Thread/unthread left sleeve, Pull shirt around back, Button/unbutton shirt    Upper body assist Assist Level: 2 helpers (Long sitting in bed)      Lower Body Dressing/Undressing Lower body dressing   What is the patient wearing?: Pants       Pants- Performed by helper: Thread/unthread right pants leg, Thread/unthread left pants leg, Pull pants up/down                      Lower body assist Assist for lower body dressing: 2 Helpers  Toileting Toileting Toileting activity did not occur: N/A   Toileting steps completed by helper: Adjust clothing prior to toileting, Performs perineal hygiene, Adjust clothing after toileting    Toileting assist Assist level: Two helpers   Transfers Chair/bed transfer   Chair/bed transfer method: Lateral scoot Chair/bed transfer assist level: 2 helpers Chair/bed transfer assistive device: Sliding board Mechanical lift: Maximove   Locomotion Ambulation Ambulation activity did not  occur: Safety/medical Editor, commissioning activity did not occur: Safety/medical concerns Type: Manual Max wheelchair distance: 300+ ft Assist Level: Dependent (Pt equals 0%)  Cognition Comprehension Comprehension assist level: Follows basic conversation/direction with extra time/assistive device  Expression Expression assist level: Expresses basic 25 - 49% of the time/requires cueing 50 - 75% of the time. Uses single words/gestures.  Social Interaction Social Interaction assist level: Interacts appropriately 75 - 89% of the time - Needs redirection for appropriate language or to initiate interaction.  Problem Solving Problem solving assist level: Solves basic 50 - 74% of the time/requires cueing 25 - 49% of the time  Memory Memory assist level: Recognizes or recalls 50 - 74% of the time/requires cueing 25 - 49% of the time   Medical Problem List and Plan: 1. Functional, cognitive and mobility deficitssecondary to Moderate TBI, C4 spinal cord injury -CIR PT, OT, speech---continue as tolerated  -displaying improved insight and awarenesss  2. DVT Prophylaxis/Anticoagulation: Pharmaceutical: Lovenox 3. Chronic neck pain/Pain Management: On Gabapentin bid   -changed hydrocodone to oxycodone for breakthrough pain  - fentanyl patch 63mcg/hr with improved pain  -pain control improved   -scheduled robaxin for cervicalgia/dystonia, cervical rom/collar  -Headaches are primarly related to occipita-cervical injury 4. Mood: LCSW to follow for evaluation and support when appropriate.  5. Neuropsych: This patient is notcapable of making decisions on hisown behalf.  -continue deficits in memory/insight 6. Skin/Wound Care: Air mattress overlay. Maintain adequate nutritional and hydration status.  7. Fluids/Electrolytes/Nutrition: Monitor I/O.   -continue TF at reduced rate to stimulate appetite   -begin full liquid diet 9. Left periorbital cellulitis/ CN III/VI  palsy:  -continue local care  -follow up per optho 10Left femur fracture s/p IM nail: NWB X 8 weeks.has left foot drop 11. Left open tibial fracture s/p I and D with ORIF: NWB--convert to CAM boot? Follow up with ortho.  11. Left mandibular fracture s/p CR /tongue laceration: Downsized to CFS # 4 on 9/17 -wires removed, screws in place for two more weeks from 9/26. No chewing  -trach plugged---consider decannulation Sunday or monday 12. Chemosis left eye: Continue Lacrilube every 3 hours till resolved per opthalmology.  -eye gradually improving. optho recommends ongoing consvt mgt 13. Facial cellulitis: keflex was completed 14. Leucocytosis: Monitor for signs of infection. 15. Thrombocytosis: Likely reactive. Dopplers pending.   16. ABLA: hgb stable at 10.5 17. Abnormal LFTs: Question due to shocked liver.  58. H/o of MDD: Was on Trintellix 5 mg and Risperidone 5 mg bid at home--continue to hold for now.   -team follow. Neuro-psych has seen patient and is following 19. Urine retention/neurogenic bladder: foley replaced and left in place due to risk of skin breakdown and pt's poor mobility 20. ID/septicemia:  -Left lower lobe infiltrate on CXR, wbc's 16k on 9/22---down to 4.6 9/24  -afebrile   -ucx with 100k E Coli  -converted to bactrim---continue through Monday then stop  -blood cultures negative .        LOS (Days) 18 A FACE TO FACE EVALUATION  WAS PERFORMED  Meredith Staggers, MD 06/08/2017 8:59 AM

## 2017-06-08 NOTE — Progress Notes (Signed)
Occupational Therapy Session Note  Patient Details  Name: [name] MRN: [id number] Date of Birth: 18-Jul-1966  Today's Date: 06/08/2017 OT Individual Time: 2353-6144 and 3154-0086 OT Individual Time Calculation (min): 16 min and 62 min   Short Term Goals: Week 3:  OT Short Term Goal 1 (Week 3): Pt will maintain a functional grasp during 1 grooming task for 1 minute OT Short Term Goal 2 (Week 3): Pt will complete 5% of lifting pants over upper legs during dressing without physical assist from helper OT Short Term Goal 3 (Week 3): Pt will complete 1 ADL task EOB for 4 minutes to improve upright tolerance   Skilled Therapeutic Interventions/Progress Updates:    Pt seen for scheduled therapy. Reported 8/10 pain in head, lethargic, forehead clammy. Pt leaning to Lt side and reported discomfort. 2 assist for boosting him up in bed and correcting alignment. Floated heels and positioned pillows to minimize hip external rotation. Notified RN regarding pts physical presentation. Pt left with spouse at time of departure.   Returned 1 hour later (per pt request) for tx. Pt visibly more alert, reported pain had improved. Requesting to get dressed. Pt assisting with elevating each LE to thread pants, also able to maintain grip on Lt side of pants but unable to assist with pulling up to thighs without physical assist. 2 helpers for rolling R>L while lifting up pants. Bilateral grip facilitated on bedrails by OT and helper. Pt completed UB dressing EOB (2 helpers for supine<sit). Pt requiring frequent Mod A for maintaining static sitting balance with bilateral UE support while helper assisted with donning button up shirt. Pt then completed slideboard transfer to w/c with 3 assist and bed elevated to maintain bilateral LE NWB. Pt requesting water. Oral care completed with pt gripping sponge swab with Lt hand while OT swabbed mouth with oral rinse. Pt consuming liquid via 4 spoonfuls after, cues for  swallowing and throat clearing provided. Pt taken to therapy gym. "I want to show you my ball trick!" Pt engaging in 2 therapeutic neuro re-ed activities in gym with use of physioball and stacking cups, focusing on trunk control and UE AAROM/AROM. Pt able to grab and flip over 2 cups (with bilateral UEs), then hand cup to OT. Pt very proud regarding his developing hand skills. He was then taken back to his new room ([id number]). Pt left with NT. TIS reclined and LEs repositioned with pillows (bilateral CAM boots donned) at time of departure.   Pt exhibits clammy skin and diaphoresis throughout session. Denies dizziness or lightheadedness. RN made aware.   Therapy Documentation Precautions:  Precautions Precautions: Fall, Cervical Required Braces or Orthoses: Cervical Brace Cervical Brace: Hard collar, At all times Restrictions Weight Bearing Restrictions: Yes RLE Weight Bearing: Non weight bearing LLE Weight Bearing: Non weight bearing General:   Vital Signs: Therapy Vitals Temp: 98.1 F (36.7 C) Temp Source: Oral Pulse Rate: 92 Resp: 18 BP: 118/70 Patient Position (if appropriate): Lying Oxygen Therapy SpO2: 100 % O2 Device: Not Delivered Pain: Pt reporting pain to be manageable with provided rest breaks   ADL:       See Function Navigator for Current Functional Status.   Therapy/Group: Individual Therapy  Gatha Mcnulty A Courtney Bellizzi 06/08/2017, 4:51 PM

## 2017-06-08 NOTE — Plan of Care (Signed)
Problem: SCI BOWEL ELIMINATION Goal: RH STG MANAGE BOWEL WITH ASSISTANCE STG Manage Bowel with total Assistance.   Outcome: Not Progressing Patient has been refusing dulcolax per report ; LBM 9/26/; MD notified. Dulcolax moved to night shift per patient request

## 2017-06-09 ENCOUNTER — Inpatient Hospital Stay (HOSPITAL_COMMUNITY): Payer: BC Managed Care – PPO

## 2017-06-09 ENCOUNTER — Inpatient Hospital Stay (HOSPITAL_COMMUNITY): Payer: Self-pay

## 2017-06-09 LAB — GLUCOSE, CAPILLARY
GLUCOSE-CAPILLARY: 93 mg/dL (ref 65–99)
GLUCOSE-CAPILLARY: 106 mg/dL — AB (ref 65–99)
GLUCOSE-CAPILLARY: 104 mg/dL — AB (ref 65–99)

## 2017-06-09 NOTE — Progress Notes (Signed)
[facility] MEDICINE & REHABILITATION     PROGRESS NOTE    Subjective/Complaints: Rested well. No new complaints. Pain reasonable  ROS: pt denies nausea, vomiting, diarrhea, cough, shortness of breath or chest pain    Objective: Vital Signs: Blood pressure 130/80, pulse 91, temperature 98.1 F (36.7 C), temperature source Oral, resp. rate 16, height 5\' 6"  (1.676 m), weight 91.7 kg (202 lb 1.7 oz), SpO2 98 %. No results found. No results for input(s): WBC, HGB, HCT, PLT in the last 72 hours. No results for input(s): NA, K, CL, GLUCOSE, BUN, CREATININE, CALCIUM in the last 72 hours.  Invalid input(s): CO CBG (last 3)   Recent Labs  06/09/17 0001 06/09/17 0408 06/09/17 0743  GLUCAP 100* 93 106*    Wt Readings from Last 3 Encounters:  06/09/17 91.7 kg (202 lb 1.7 oz)  05/21/17 99 kg (218 lb 3.2 oz)  01/11/17 91.6 kg (202 lb)    Physical Exam:  Constitutional: He appears well-developedand well-nourished. He is in no distress.  HENT:  Head: Normocephalicand atraumatic.   Eyes:  Unable to move right eye laterally. Left eye remains injected and limited to lateral field--- left eye less injected Neck:  Phonation better.  Immobilized by Aspen collar.head more neutral.  Cardiovascular:. RRR without murmur. No JVD        Respiratory: CTA Bilaterally without wheezes or rales. Normal effort   GI: soft NTND Genitourinary:  Genitourinary Comments: foley   in place  Musculoskeletal: He exhibits continued    left thigh/leg wrapped, no drainage.  Neurological:    Motor 1-2/5 UE and LE's, other than above. Trace to absent movement left ankle.   Senses pain stim in all 4. . Left III, VI nerve weakness but seems to have some movement. Right VI deficit. Oriented to person and place and reason here. More attentive, better insight Skin: Skin is warmand dry.  Wounds all dry/healing Psychiatric: pleasant   Assessment/Plan: 1. Functional deficits, tetraplegia secondary to  TBI/cervical SCI which require 3+ hours per day of interdisciplinary therapy in a comprehensive inpatient rehab setting. Physiatrist is providing close team supervision and 24 hour management of active medical problems listed below. Physiatrist and rehab team continue to assess barriers to discharge/monitor patient progress toward functional and medical goals.  Function:  Bathing Bathing position      Bathing parts   Body parts bathed by helper: Left arm, Right arm, Chest, Abdomen, Front perineal area, Buttocks, Right upper leg, Left upper leg, Right lower leg, Left lower leg  Bathing assist Assist Level: 2 helpers      Upper Body Dressing/Undressing Upper body dressing   What is the patient wearing?: Button up shirt           Button up shirt - Perfomed by helper: Thread/unthread right sleeve, Thread/unthread left sleeve, Pull shirt around back, Button/unbutton shirt    Upper body assist Assist Level: 2 helpers (sitting EOB)      Lower Body Dressing/Undressing Lower body dressing   What is the patient wearing?: Pants       Pants- Performed by helper: Thread/unthread right pants leg, Thread/unthread left pants leg, Pull pants up/down                      Lower body assist Assist for lower body dressing: 2 Helpers      Toileting Toileting Toileting activity did not occur: N/A   Toileting steps completed by helper: Adjust clothing prior to toileting, Performs perineal  hygiene, Adjust clothing after toileting    Toileting assist Assist level: Two helpers   Transfers Chair/bed transfer   Chair/bed transfer method: Lateral scoot Chair/bed transfer assist level: 2 helpers Chair/bed transfer assistive device: Sliding board Mechanical lift: Maximove   Locomotion Ambulation Ambulation activity did not occur: Safety/medical Editor, commissioning activity did not occur: Safety/medical concerns Type: Manual Max wheelchair distance: 300+  ft Assist Level: Dependent (Pt equals 0%)  Cognition Comprehension Comprehension assist level: Follows basic conversation/direction with extra time/assistive device  Expression Expression assist level: Expresses basic 25 - 49% of the time/requires cueing 50 - 75% of the time. Uses single words/gestures.  Social Interaction Social Interaction assist level: Interacts appropriately 75 - 89% of the time - Needs redirection for appropriate language or to initiate interaction.  Problem Solving Problem solving assist level: Solves basic 50 - 74% of the time/requires cueing 25 - 49% of the time  Memory Memory assist level: Recognizes or recalls 50 - 74% of the time/requires cueing 25 - 49% of the time   Medical Problem List and Plan: 1. Functional, cognitive and mobility deficitssecondary to Moderate TBI, C4 spinal cord injury -CIR PT, OT, speech---continue as tolerated  -displaying improved insight and awarenesss  2. DVT Prophylaxis/Anticoagulation: Pharmaceutical: Lovenox 3. Chronic neck pain/Pain Management: On Gabapentin bid   -changed hydrocodone to oxycodone for breakthrough pain  - fentanyl patch 27mcg/hr with improved pain  -pain control improved   -scheduled robaxin for cervicalgia/dystonia, cervical rom/collar  -Headaches are primarly related to occipital-cervical fractures 4. Mood: LCSW to follow for evaluation and support when appropriate.  5. Neuropsych: This patient is notcapable of making decisions on hisown behalf.  -continue deficits in memory/insight 6. Skin/Wound Care: Air mattress overlay. Maintain adequate nutritional and hydration status.  7. Fluids/Electrolytes/Nutrition: Monitor I/O.   -continue TF at reduced rate to supplement his PO   -on full liquid diet 9. Left periorbital cellulitis/ CN III/VI palsy:  -continue local care  -follow up per optho 10Left femur fracture s/p IM nail: NWB X 8 weeks.has left foot drop 11. Left open tibial fracture s/p I  and D with ORIF: NWB--convert to CAM boot? Follow up with ortho.  11. Left mandibular fracture s/p CR /tongue laceration: Downsized to CFS # 4 on 9/17 -wires removed, screws in place for two more weeks from 9/26. No chewing  -trach plugged---decannulate Monday 12. Chemosis left eye: Continue Lacrilube every 3 hours till resolved per opthalmology.  -eye gradually improving. optho recommends ongoing consvt mgt 13. Facial cellulitis: keflex was completed 14. Leucocytosis: Monitor for signs of infection. 15. Thrombocytosis: Likely reactive. Dopplers pending.   16. ABLA: hgb stable at 10.5 17. Abnormal LFTs: Question due to shocked liver.  9. H/o of MDD: Was on Trintellix 5 mg and Risperidone 5 mg bid at home--continue to hold for now.   -team follow. Neuro-psych has seen patient and is following 19. Urine retention/neurogenic bladder: foley replaced and left in place due to risk of skin breakdown and pt's poor mobility 20. ID/septicemia:  -Left lower lobe infiltrate on CXR, wbc's 16k on 9/22---down to 4.6 9/24  -afebrile   -ucx with 100k E Coli  -converted to bactrim---continue through Monday then stop  -blood cultures negative .        LOS (Days) Vincent  Meredith Staggers, MD 06/09/2017 8:29 AM

## 2017-06-09 NOTE — Progress Notes (Signed)
Physical Therapy Session Note  Patient Details  Name: [name] MRN: [id number] Date of Birth: 27-Apr-1966  Today's Date: 06/09/2017 PT Individual Time: 0901-1000 PT Individual Time Calculation (min): 59 min   Short Term Goals: Week 3:  PT Short Term Goal 1 (Week 3): Pt will demonstrate dynamic sitting balance x3 min on mat table with minA PT Short Term Goal 2 (Week 3): Pt will demonstrate rolling R/L maxA +1 PT Short Term Goal 3 (Week 3): Pt will direct care for set up of transfer with min cues   Skilled Therapeutic Interventions/Progress Updates:    Pt sitting up in bed finishing breakfast with NT upon PT arrival, reported feeling tired and in pain 6/10, requesting to do exercises in bed. This PT suggested getting OOB this session since this would be his only therapy for the day, pt agreeable. Pt transferred from sitting up in bed to sitting EOB with +2 max assist, pts R hand on handrail to help pull up. Pt seated EOB working on dynamic and static sitting balance while therapist set up TIS w/c for transfer. Pt transferred from bed>w/c, lateral scoot using slideboard and max assist +2. Pt able to scoot hips back one at a time once in w/c with mod assist and verbal cues for technique. Pt insisted on performing cup and theraball exercises this session. Pt transported to gym total A. Pt sitting up in w/c without back support working on dynamic sitting balance, focusing on trunk control and UE AAROM/AROM in order to grab and flip over 4 cups (with bilateral UEs), then hand cup to PT. Pt performed physioball roll outs 2 x 15 with physioball placed on bench and assist to keep UEs on ball, pt performed active trunk flexion/extension and passive UE flexion/extension in order to move the ball forward/back. Seated in w/c pt performed AAROM LAQ 2 x 5 each leg and therapist performed 2 x 30 sec hamstring stretch each leg. Pt transported back to room and repositioned in TIS, left with needs in reach.    Therapy Documentation Precautions:  Precautions Precautions: Fall, Cervical Required Braces or Orthoses: Cervical Brace Cervical Brace: Hard collar, At all times Restrictions Weight Bearing Restrictions: Yes RLE Weight Bearing: Non weight bearing LLE Weight Bearing: Non weight bearing   See Function Navigator for Current Functional Status.   Therapy/Group: Individual Therapy  Netta Corrigan, PT, DPT 06/09/2017, 7:49 AM

## 2017-06-09 NOTE — Plan of Care (Signed)
Problem: SCI BOWEL ELIMINATION Goal: RH STG MANAGE BOWEL WITH ASSISTANCE STG Manage Bowel with total Assistance.   Outcome: Not Progressing LBM 9/27; patient refused dulcolax last night per report; RN discussed the need for bowel program ; patient agreed to have it tonight.

## 2017-06-10 ENCOUNTER — Inpatient Hospital Stay (HOSPITAL_COMMUNITY): Payer: Self-pay | Admitting: Physical Therapy

## 2017-06-10 ENCOUNTER — Inpatient Hospital Stay (HOSPITAL_COMMUNITY): Payer: BC Managed Care – PPO | Admitting: Speech Pathology

## 2017-06-10 ENCOUNTER — Inpatient Hospital Stay (HOSPITAL_COMMUNITY): Payer: Self-pay | Admitting: Occupational Therapy

## 2017-06-10 ENCOUNTER — Inpatient Hospital Stay (HOSPITAL_COMMUNITY): Payer: Self-pay

## 2017-06-10 DIAGNOSIS — R339 Retention of urine, unspecified: Secondary | ICD-10-CM

## 2017-06-10 LAB — GLUCOSE, CAPILLARY
GLUCOSE-CAPILLARY: 105 mg/dL — AB (ref 65–99)
GLUCOSE-CAPILLARY: 106 mg/dL — AB (ref 65–99)
GLUCOSE-CAPILLARY: 108 mg/dL — AB (ref 65–99)
GLUCOSE-CAPILLARY: 107 mg/dL — AB (ref 65–99)
GLUCOSE-CAPILLARY: 108 mg/dL — AB (ref 65–99)
GLUCOSE-CAPILLARY: 106 mg/dL — AB (ref 65–99)
Glucose-Capillary: 109 mg/dL — ABNORMAL HIGH (ref 65–99)
Glucose-Capillary: 118 mg/dL — ABNORMAL HIGH (ref 65–99)
Glucose-Capillary: 78 mg/dL (ref 65–99)
Glucose-Capillary: 94 mg/dL (ref 65–99)
Glucose-Capillary: 116 mg/dL — ABNORMAL HIGH (ref 65–99)
Glucose-Capillary: 99 mg/dL (ref 65–99)
Glucose-Capillary: 131 mg/dL — ABNORMAL HIGH (ref 65–99)
Glucose-Capillary: 111 mg/dL — ABNORMAL HIGH (ref 65–99)

## 2017-06-10 MED ORDER — FENTANYL 25 MCG/HR TD PT72
25.0000 ug | MEDICATED_PATCH | TRANSDERMAL | Status: DC
  Administered 2017-06-10 – 2017-07-16 (×13): 25 ug via TRANSDERMAL
  Filled 2017-06-10 (×14): qty 1

## 2017-06-10 NOTE — Progress Notes (Signed)
Occupational Therapy Session Note  Patient Details  Name: [name] MRN: [id number] Date of Birth: Dec 13, 1965  Today's Date: 06/10/2017 OT Individual Time: 1300-1345 OT Individual Time Calculation (min): 45 min    Short Term Goals: Week 3:  OT Short Term Goal 1 (Week 3): Pt will maintain a functional grasp during 1 grooming task for 1 minute OT Short Term Goal 2 (Week 3): Pt will complete 5% of lifting pants over upper legs during dressing without physical assist from helper OT Short Term Goal 3 (Week 3): Pt will complete 1 ADL task EOB for 4 minutes to improve upright tolerance   Skilled Therapeutic Interventions/Progress Updates:    Pt seen for OT session focusing on UE ROM and core strengthening/ stability. Pt sitting up in tilt-in-space w/c upon arrival, lethargic with complaints of dizziness. BP 113/66. Pt agreeable to attempt therapy. Transferred pt in w/c to therapy day room for increased stimulus for alertness. Pt completed gross grasping activities as well as elbow flexion/extension assist and unassisted against gravity. With increased time and effort, pt demonstrates full elbow flexion/extension movements against gravity, however, fatigues quickly, only able to complete 1-2 reps with full ROM. Addressed core strengthening/stability with pt required to reach forward from back of w/c and maintain unsupported static sitting balance, completed with supervision, however, fatigues quickly requiring reclined rest breaks. At high/low table, completed towel pushes with elbow internal and external rotation. Completed on R/L x3 each. Pt returned to room, agreeable to staying up in chair until next session, reclined back and left with all needs in reach.   Therapy Documentation Precautions:  Precautions Precautions: Fall, Cervical Required Braces or Orthoses: Cervical Brace Cervical Brace: Hard collar, At all times Restrictions Weight Bearing Restrictions: Yes RLE Weight  Bearing: Non weight bearing LLE Weight Bearing: Non weight bearing Pain:   No/ denies pain  See Function Navigator for Current Functional Status.   Therapy/Group: Individual Therapy  Lewis, Eligh Rybacki C 06/10/2017, 7:13 AM

## 2017-06-10 NOTE — Progress Notes (Signed)
Pt complained of discomfort in pelvic area and at site of foley insertion. Upon assessment, it was discovered that the patient's foley catheter was occluded. After further inspection by a second RN, the patient's foley catheter was changed and the bladder properly drained of urine. Pt expressed relief after procedure and continued to be monitored.  Renato Gails, RN

## 2017-06-10 NOTE — Progress Notes (Signed)
Occupational Therapy Session Note  Patient Details  Name: [name] MRN: [id number] Date of Birth: April 30, 1966  Today's Date: 06/10/2017 OT Individual Time: 0900-1000 OT Individual Time Calculation (min): 60 min    Short Term Goals: Week 3:  OT Short Term Goal 1 (Week 3): Pt will maintain a functional grasp during 1 grooming task for 1 minute OT Short Term Goal 2 (Week 3): Pt will complete 5% of lifting pants over upper legs during dressing without physical assist from helper OT Short Term Goal 3 (Week 3): Pt will complete 1 ADL task EOB for 4 minutes to improve upright tolerance   Skilled Therapeutic Interventions/Progress Updates:    Pt resting in bed upon arrival.  Focus on bed mobility, directing care, slide board transfers, and BUE functional use.  Pt exhibits increased active BUE movement but not yet functional.  Pt requires max A for rolling in bed but is able to direct care.  Pt requires tot A for supine>sit EOB and max A for slide board transfers to w/c.  Pt remained in w/c with RN present.   Therapy Documentation Precautions:  Precautions Precautions: Fall, Cervical Required Braces or Orthoses: Cervical Brace Cervical Brace: Hard collar, At all times Restrictions Weight Bearing Restrictions: Yes RLE Weight Bearing: Non weight bearing LLE Weight Bearing: Non weight bearing Pain: Pt c/o B shoulder pain; meds admin by RN, repositioned  See Function Navigator for Current Functional Status.   Therapy/Group: Individual Therapy  Leroy Libman 06/10/2017, 10:59 AM
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
## 2017-06-10 NOTE — Progress Notes (Signed)
Physical Therapy Session Note  Patient Details  Name: [name] MRN: [id number] Date of Birth: [birth year]  Today's Date: 06/10/2017 PT Individual Time: 1000-1040 and 1400-1445 PT Individual Time Calculation (min): 40 min and 45 min (total 85 min)   Short Term Goals: Week 3:  PT Short Term Goal 1 (Week 3): Pt will demonstrate dynamic sitting balance x3 min on mat table with minA PT Short Term Goal 2 (Week 3): Pt will demonstrate rolling R/L maxA +1 PT Short Term Goal 3 (Week 3): Pt will direct care for set up of transfer with min cues   Skilled Therapeutic Interventions/Progress Updates: Tx 1: Pt received seated in w/c, denies pain and agreeable to treatment. Per RN report pt recently received pain patch and may feel nauseous or fatigued. Transfer w/c >mat table maxA +2 with pt attempting to initiate scooting with hips however LOBs to R side requiring assist to recover. Sitting balance on edge of mat with min guard while engaged in BLE soccer ball kicks for LE coordination  And strengthening. Long arc quads through partial ROM x10 reps RLE before fatigued, x6 reps LLE before fatigued. UE reaching for horseshoes with AAROM and attempted throwing; improved grasp and coordination with repetition. UE stability ball pushes forward and laterally for core and shoulder stability/coordination. Pt fell asleep mid-push, unable to stay awake with multimodal cueing. Returned to w/c maxA +2; pt woke up long enough to attempt assisting with scooting, however minimal participation. Remained semi-reclined in w/c at end of session; RN alerted to pt status.   Tx 2: Pt received seated in w/c, reported he had incontinent bowel movement and was waiting for help to get changed. Transferred to bed maxA +2 with slight downhill to bed with improved pt initiation with scooting. Sit >supine totalA +2. Rolling R/L maxA +2 with pt able to demonstrate partial heel slide to initiate roll, and able to roll from side  >supine with S and increased time. Hygiene and clothing management [place]. BLE PROM to hip IR/ER, hamstrings, L gastroc 2x30-60 each. Continues to have pain at end range L knee flexion approximately 90 degrees. Positioned in bed, remained supine with all needs in reach at completion of session.      Therapy Documentation Precautions:  Precautions Precautions: Fall, Cervical Required Braces or Orthoses: Cervical Brace Cervical Brace: Hard collar, At all times Restrictions Weight Bearing Restrictions: Yes RLE Weight Bearing: Non weight bearing LLE Weight Bearing: Non weight bearing General: PT Amount of Missed Time (min): 20 Minutes PT Missed Treatment Reason: Patient fatigue   See Function Navigator for Current Functional Status.   Therapy/Group: Individual Therapy  [name] 06/10/2017, 7:27 AM

## 2017-06-10 NOTE — Progress Notes (Signed)
Pt sitting in chair.  Pt on RA and capped.  Decanulated pt per order.  Pt tolerated well.  Dressing applied gauze and tape. HR 86,  SPO2 96% on RA.  Pt able to verbalize.  RT to monitor.

## 2017-06-10 NOTE — Progress Notes (Signed)
Speech Language Pathology Daily Session Note  Patient Details  Name: [name] MRN: [id number] Date of Birth: June 28, 1966  Today's Date: 06/10/2017 SLP Individual Time: 0730-0825 SLP Individual Time Calculation (min): 55 min  Short Term Goals: Week 3: SLP Short Term Goal 1 (Week 3): Pt will identify at least 2 new deficits and their impact on his functional independence with mod assist verbal cues.   SLP Short Term Goal 2 (Week 3): Pt will verbalize at least 2 safety precautions with mod assist verbal cues.   SLP Short Term Goal 3 (Week 3): Pt will direct self care by answering questions regarding PO intake (i.e., amount of intake, cup vs spoon) with Mod A cues.  SLP Short Term Goal 4 (Week 3): Pt will consume full liquid diet with Min overt s/s of aspiration and Min A cues for use of compensatory strategies.   Skilled Therapeutic Interventions: Skilled treatment session focused on speech and dysphagia goals. Upon arrival, patient's trach was capped and all vitals remaining [facility] throughout session. Patient required Min A verbal cues for use of speech intelligibility strategies at the sentence level during functional conversation. Patient consumed his breakfast meal of a full liquid diet without overt s/s of aspiration and required Min-Mod A verbal cues to direct his care. Patient left upright in bed with NT present. Continue with current plan of care.      Function:  Eating Eating   Modified Consistency Diet: Yes Eating Assist Level: Helper feeds patient;Supervision or verbal cues           Cognition Comprehension Comprehension assist level: Follows basic conversation/direction with extra time/assistive device  Expression Expression assistive device: Other (Comment) (Trach removed today) Expression assist level: Expresses basic 75 - 89% of the time/requires cueing 10 - 24% of the time. Needs helper to occlude trach/needs to repeat words.  Social Interaction Social  Interaction assist level: Interacts appropriately 75 - 89% of the time - Needs redirection for appropriate language or to initiate interaction.  Problem Solving Problem solving assist level: Solves basic 50 - 74% of the time/requires cueing 25 - 49% of the time  Memory Memory assist level: Recognizes or recalls 50 - 74% of the time/requires cueing 25 - 49% of the time    Pain Pain in neck, patient pre-medicated and repositioned   Therapy/Group: Individual Therapy  Johnnie Moten 06/10/2017, 2:23 PM

## 2017-06-11 ENCOUNTER — Inpatient Hospital Stay (HOSPITAL_COMMUNITY): Payer: Self-pay | Admitting: *Deleted

## 2017-06-11 ENCOUNTER — Inpatient Hospital Stay (HOSPITAL_COMMUNITY): Payer: Self-pay | Admitting: Speech Pathology

## 2017-06-11 ENCOUNTER — Inpatient Hospital Stay (HOSPITAL_COMMUNITY): Payer: Self-pay

## 2017-06-11 ENCOUNTER — Inpatient Hospital Stay (HOSPITAL_COMMUNITY): Payer: Self-pay | Admitting: Physical Therapy

## 2017-06-11 LAB — GLUCOSE, CAPILLARY
Glucose-Capillary: 105 mg/dL — ABNORMAL HIGH (ref 65–99)
Glucose-Capillary: 100 mg/dL — ABNORMAL HIGH (ref 65–99)

## 2017-06-11 MED ORDER — BENZOCAINE 10 % MT GEL
Freq: Three times a day (TID) | OROMUCOSAL | Status: DC | PRN
  Administered 2017-06-12: 10:00:00 via OROMUCOSAL
  Filled 2017-06-11: qty 9

## 2017-06-11 NOTE — Progress Notes (Signed)
Occupational Therapy Session Note  Patient Details  Name: [name] MRN: [id number] Date of Birth: [birth year]  Today's Date: 06/11/2017 OT Individual Time: 5537-4827 OT Individual Time Calculation (min): 56 min    Short Term Goals: Week 3:  OT Short Term Goal 1 (Week 3): Pt will maintain a functional grasp during 1 grooming task for 1 minute OT Short Term Goal 2 (Week 3): Pt will complete 5% of lifting pants over upper legs during dressing without physical assist from helper OT Short Term Goal 3 (Week 3): Pt will complete 1 ADL task EOB for 4 minutes to improve upright tolerance   Skilled Therapeutic Interventions/Progress Updates:    Focus on bed mobility, initiating rolling in bed, directing care, sitting balance, BUE AAROM, slide board transfers, and activity tolerance.  Pt noted with improved BUE movement and weak grasp,  Pt initiates rolling in bed by lift appropriate knee but requires assistance with rolling UB and moving UE.  Pt able to maintain static sitting balance with close supervision.  Pt initiates scooting on slide board but requires tot A to complete transfer.  Pt remained in w/c with soft call bell placed in L hand.   Therapy Documentation Precautions:  Precautions Precautions: Fall, Cervical Required Braces or Orthoses: Cervical Brace Cervical Brace: Hard collar, At all times Restrictions Weight Bearing Restrictions: Yes RLE Weight Bearing: Non weight bearing LLE Weight Bearing: Non weight bearing   Pain:   Unrated pain R gum line in mouth secondary to wire rubbing lip; RN and PA aware   See Function Navigator for Current Functional Status.   Therapy/Group: Individual Therapy  Leroy Libman 06/11/2017, 9:17 AM

## 2017-06-11 NOTE — Progress Notes (Signed)
Speech Language Pathology Daily Session Note  Patient Details  Name: [name] MRN: [id number] Date of Birth: May 24, 1966  Today's Date: 06/11/2017 SLP Individual Time: 1305-1400 SLP Individual Time Calculation (min): 55 min  Short Term Goals: Week 3: SLP Short Term Goal 1 (Week 3): Pt will identify at least 2 new deficits and their impact on his functional independence with mod assist verbal cues.   SLP Short Term Goal 2 (Week 3): Pt will verbalize at least 2 safety precautions with mod assist verbal cues.   SLP Short Term Goal 3 (Week 3): Pt will direct self care by answering questions regarding PO intake (i.e., amount of intake, cup vs spoon) with Mod A cues.  SLP Short Term Goal 4 (Week 3): Pt will consume full liquid diet with Min overt s/s of aspiration and Min A cues for use of compensatory strategies.   Skilled Therapeutic Interventions:  Pt was seen for skilled ST targeting goals for dysphagia and communication.  Pt decannulated, voice strong but pt needed min assist verbal cues to increase vocal intensity to achieve intelligibility at the sentences.  Pt directed his care when setting up his meal with min assist question cues.  Pt consumed full liquids diet with no overt s/s of aspiration with supervision use of swallowing precautions.  Therapist also facilitated the session with trials of liquids via straw per pt's request with no s/s of aspiration noted.  Pt was left in bed with bed alarm set and call bell within reach.  Continue per current plan of care.    Function:  Eating Eating   Modified Consistency Diet: Yes Eating Assist Level: Helper feeds patient           Cognition Comprehension Comprehension assist level: Follows basic conversation/direction with extra time/assistive device  Expression   Expression assist level: Expresses basic 75 - 89% of the time/requires cueing 10 - 24% of the time. Needs helper to occlude trach/needs to repeat words.  Social  Interaction Social Interaction assist level: Interacts appropriately 75 - 89% of the time - Needs redirection for appropriate language or to initiate interaction.  Problem Solving Problem solving assist level: Solves basic 75 - 89% of the time/requires cueing 10 - 24% of the time  Memory Memory assist level: Recognizes or recalls 50 - 74% of the time/requires cueing 25 - 49% of the time    Pain Pain Assessment Pain Assessment: No/denies pain  Therapy/Group: Individual Therapy  Sianna Garofano, Selinda Orion 06/11/2017, 2:35 PM

## 2017-06-11 NOTE — Progress Notes (Signed)
Occupational Therapy Note  Patient Details  Name: [name] MRN: [id number] Date of Birth: 05/31/1966  Today's Date: 06/11/2017 OT Individual Time: 1100-1155 OT Individual Time Calculation (min): 55 min   Pt c/o R shoulder during bed mobility; repositioned Individual Therapy  Pt resting in TIS w/c upon arrival.  Pt noted with difficulty keeping eyes open.  Focus on slide board transfers, bed mobility, and directing care.  Pt performed slide board transfer with tot A with pt initiating sliding but requiring assistance to complete task.  Pt directed therapist during transfer.  Pt abel to sit EOB with supervision.  Pt required tot A for sit>supine.  In supine position focus on initiating rolling to R and L with pt directing care and sequencing.  Pt requires max A for rolling to R and L. Pt remained in bed with soft call bell in L hand; pt able to activate call bell.   [name] [facility] 06/11/2017, 12:06 PM
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
## 2017-06-11 NOTE — Progress Notes (Signed)
Physical Therapy Session Note  Patient Details  Name: [name] MRN: [id number] Date of Birth: 10/17/1965  Today's Date: 06/11/2017 PT Individual Time: 0900-1000 and 1430-1500 PT Individual Time Calculation (min): 60 min and 30 min (total 90 min)   Short Term Goals: Week 3:  PT Short Term Goal 1 (Week 3): Pt will demonstrate dynamic sitting balance x3 min on mat table with minA PT Short Term Goal 2 (Week 3): Pt will demonstrate rolling R/L maxA +1 PT Short Term Goal 3 (Week 3): Pt will direct care for set up of transfer with min cues   Skilled Therapeutic Interventions/Progress Updates: Tx 1: Pt received seated in w/c, denies pain and agreeable to treatment. RN present to assist with managing mouth discomfort d/t wire. Pt requested to try arm bike; requires assist to get UEs onto handles, and support at R elbow to reduce weight. With repetition pt eventually able to perform reciprocal pedaling without assistance, slow speed. Transfer w/c <>mat table with transfer board and maxA +2; pt assisting with initiating scoot however unable to progress across board without assist. Sit <>supine totalA +2. Rolling to R side performed with max/totalA reduced to minA with repetition; utilized wedge to begin training in reduced ROM. Performed BLE glute bridged with bolster under LEs; pt with good activation of glutes during task however does not clear table with hips. Pt's w/c exchanged for tilt in space with elevating leg rests. Returned to w/c and room as above; remained semi-reclined in w/c at end of session, all needs in reach.   Tx 2: Pt received seated in bed, denies pain at rest but states "I've had better days", still frustrated by mouth wires.  Pt eager to show therapist how he is able to roll in bed; able to initiate task and assist however still requires mod/maxA. Performed AAROM straight leg raises 2x10, hip adduction pillow squeeze isometric, glute sets, hamstring stretch. Provided pt with  handouts of exercises/stretches to allow pt's wife to perform with him in the evening; encouraged pt to direct care since wife is not available during the day to learn from therapist. Remained semi-reclined in bed at end of session, all needs in reach.      Therapy Documentation Precautions:  Precautions Precautions: Fall, Cervical Required Braces or Orthoses: Cervical Brace Cervical Brace: Hard collar, At all times Restrictions Weight Bearing Restrictions: Yes RLE Weight Bearing: Non weight bearing LLE Weight Bearing: Non weight bearing  See Function Navigator for Current Functional Status.   Therapy/Group: Individual Therapy  [name] 06/11/2017, 10:25 AM

## 2017-06-12 ENCOUNTER — Inpatient Hospital Stay (HOSPITAL_COMMUNITY): Payer: Self-pay | Admitting: Physical Therapy

## 2017-06-12 ENCOUNTER — Inpatient Hospital Stay (HOSPITAL_COMMUNITY): Payer: BC Managed Care – PPO | Admitting: Speech Pathology

## 2017-06-12 ENCOUNTER — Inpatient Hospital Stay (HOSPITAL_COMMUNITY): Payer: Self-pay

## 2017-06-12 LAB — GLUCOSE, CAPILLARY
GLUCOSE-CAPILLARY: 92 mg/dL (ref 65–99)
GLUCOSE-CAPILLARY: 95 mg/dL (ref 65–99)
GLUCOSE-CAPILLARY: 136 mg/dL — AB (ref 65–99)
GLUCOSE-CAPILLARY: 98 mg/dL (ref 65–99)
GLUCOSE-CAPILLARY: 106 mg/dL — AB (ref 65–99)
Glucose-Capillary: 111 mg/dL — ABNORMAL HIGH (ref 65–99)
Glucose-Capillary: 112 mg/dL — ABNORMAL HIGH (ref 65–99)
Glucose-Capillary: 186 mg/dL — ABNORMAL HIGH (ref 65–99)
Glucose-Capillary: 99 mg/dL (ref 65–99)
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
## 2017-06-12 NOTE — Progress Notes (Signed)
Pt resting in bed quietly. Easily aroused. C/o generalized pain and rates 5-6 on numerical scale. Pain management administered and effective. Noted to be diaphoretic twice this shift. Foley cath emptied, hob retracted and suppository administered to relieve symptoms and effective. Wife came in to visit and assisted pt with bed bath per request with touch therapy. pegtube patent, cleaned and dry drsg applied. Fentanyl patch is noted to L deltoid. Foley cath care ir provided this shift. callbell within reach. Pt excited to show wife that he is able to get up to 1500 using the incentive spirometer and looking forward to being able to "blow out his candles."Safety maintained. Will continue to monitor.

## 2017-06-12 NOTE — Progress Notes (Signed)
Occupational Therapy Note  Patient Details  Name: [name] MRN: [id number] Date of Birth: [birth year]  Today's Date: 06/12/2017 OT Missed Time: 69 Minutes Missed Time Reason: Patient unwilling/refused to participate without medical reason  Pt missed 60 mins skilled OT services.  Pt resting in bed upon arrival.  Pt upset from earlier therapy session with SLP and stated he wasn't going to do anything until he was able to apologize to therapist.  Pt declined participating in therapy at this time.    [name] [facility] 06/12/2017, 10:13 AM

## 2017-06-12 NOTE — Progress Notes (Signed)
Physical Therapy Session Note  Patient Details  Name: [name] MRN: [id number] Date of Birth: Jul 18, 1966  Today's Date: 06/12/2017 PT Individual Time: 0900-0925 PT Individual Time Calculation (min): 25 min   Short Term Goals: Week 3:  PT Short Term Goal 1 (Week 3): Pt will demonstrate dynamic sitting balance x3 min on mat table with minA PT Short Term Goal 2 (Week 3): Pt will demonstrate rolling R/L maxA +1 PT Short Term Goal 3 (Week 3): Pt will direct care for set up of transfer with min cues   Skilled Therapeutic Interventions/Progress Updates: Pt received seated in w/c, denies pain but verbalizing frustration with previous SLP session. Pt initially refusing to participate in any therapy, reporting "I'm just not emotionally there right now". Offered emotional support, encouraged participation in therapy. Pt does request to return to bed. Transfer w/c >bed totalA +1 (pt <25% assist). Sit >supine totalA+2. Pt able to partially bridge hips to scoot in bed with assistance. Remained semi-reclined in bed at end of session, all needs in reach.      Therapy Documentation Precautions:  Precautions Precautions: Fall, Cervical Required Braces or Orthoses: Cervical Brace Cervical Brace: Hard collar, At all times Restrictions Weight Bearing Restrictions: Yes RLE Weight Bearing: Non weight bearing LLE Weight Bearing: Non weight bearing General: PT Amount of Missed Time (min): 35 Minutes PT Missed Treatment Reason: Increased agitation   See Function Navigator for Current Functional Status.   Therapy/Group: Individual Therapy  [name] 06/12/2017, 9:55 AM

## 2017-06-12 NOTE — Progress Notes (Signed)
Speech Language Pathology Daily Session Note  Patient Details  Name: [name] MRN: [id number] Date of Birth: [birth year]  Today's Date: 06/12/2017   Skilled treatment session #1 SLP Individual Time: 8563-1497 SLP Individual Time Calculation (min): 23 min   Skilled treatment session #2 SLP Individual Time: 1130-1200 SLP Individual Time Calculation (min): 30 minutes  Short Term Goals: Week 3: SLP Short Term Goal 1 (Week 3): Pt will identify at least 2 new deficits and their impact on his functional independence with mod assist verbal cues.   SLP Short Term Goal 2 (Week 3): Pt will verbalize at least 2 safety precautions with mod assist verbal cues.   SLP Short Term Goal 3 (Week 3): Pt will direct self care by answering questions regarding PO intake (i.e., amount of intake, cup vs spoon) with Mod A cues.  SLP Short Term Goal 4 (Week 3): Pt will consume full liquid diet with Min overt s/s of aspiration and Min A cues for use of compensatory strategies.   Skilled Therapeutic Interventions:  Skilled treatment session #1 focused on cognition goals. SLP was alerted to pt's increased emotional state regarding use of spoon with thin liquids. SLP went to pt's room for education and to further reiterate upcoming ST session at 11:30 for trial of straw. Pt was emotionally distraught and unable/unwilling to comprehend SLP's comments d/t his multiple interruptions. Pt handed off to PT and will readdress at 11:30.   Skilled treatment session #2 focused on dysphagia and cognition goals. SLP facilitated session by providing skilled observation of pt consuming thin liquids via straw. Pt with multiple consecutive sips via straw without overt s/s of aspiration. Pt appears to be appropriate for use of straw with full supervision from nursing. Pt's cognition and intellectual awareness continues to be evident with requests such as allowing CNAs to observe him with meals (as CNAs have been observing him  frequently). Family has not been present during ST sessions and is not signed off on yet. Will continue to monitor for their presence and attempt education. Pt left upright in bed with all needs within reach. Continue per current plan of care.      Function:  Eating Eating   Modified Consistency Diet: Yes Eating Assist Level: Helper feeds patient     [place] on Utensil: Every scoop     Cognition Comprehension Comprehension assist level: Follows complex conversation/direction with extra time/assistive device;Understands complex 90% of the time/cues 10% of the time  Expression   Expression assist level: Expresses complex 90% of the time/cues < 10% of the time  Social Interaction Social Interaction assist level: Interacts appropriately with others - No medications needed.  Problem Solving Problem solving assist level: Solves basic 90% of the time/requires cueing < 10% of the time  Memory Memory assist level: Recognizes or recalls 75 - 89% of the time/requires cueing 10 - 24% of the time    Pain    Therapy/Group: Individual Therapy  Trust Leh 06/12/2017, 12:30 PM

## 2017-06-12 NOTE — Progress Notes (Signed)
[place] PHYSICAL MEDICINE & REHABILITATION     PROGRESS NOTE    Subjective/Complaints: Finishing up with therapy. In good spirits. Slept ok. Happy about some increased movements in his arms.   ROS: pt denies nausea, vomiting, diarrhea, cough, shortness of breath or chest pain    Objective: Vital Signs: Blood pressure 117/68, pulse 73, temperature 99 F (37.2 C), temperature source Oral, resp. rate 16, height 5\' 6"  (1.676 m), weight 93.1 kg (205 lb 4.7 oz), SpO2 98 %. No results found. No results for input(s): WBC, HGB, HCT, PLT in the last 72 hours. No results for input(s): NA, K, CL, GLUCOSE, BUN, CREATININE, CALCIUM in the last 72 hours.  Invalid input(s): CO CBG (last 3)   Recent Labs  06/11/17 1201 06/12/17 0330 06/12/17 0812  GLUCAP 100* 95 136*    Wt Readings from Last 3 Encounters:  06/12/17 93.1 kg (205 lb 4.7 oz)  05/21/17 99 kg (218 lb 3.2 oz)  01/11/17 91.6 kg (202 lb)    Physical Exam:  Constitutional: He appears well-developedand well-nourished. He is in no distress.  HENT:  Head: Normocephalicand atraumatic.   Eyes:  Unable to move right eye laterally. Left eye limited to lateral field and medial fields--- left eye less irritated Neck:  Phonation good  Immobilized by Aspen collar. Trach site dressed without leakage Cardiovascular:. RRR without murmur. No JVD         Respiratory: CTA Bilaterally without wheezes or rales. Normal effort   GI: PEG site intact Genitourinary:  Genitourinary Comments: foley   in place  With yellow liquids Musculoskeletal: He exhibits continued    left thigh/leg wrapped,dry Neurological:    Motor 1-2/5 UE, early tricep movement. Trace to absent movement left ankle.   Senses pain stim in all 4. . Left III, VI nerve weakness but seems to have some movement. Right VI deficit. Oriented to person and place and reason here. More attentive and improved insight Skin: Skin is warmand dry.  Wounds all dry/healing. Sutures  intact LLE Psychiatric: pleasant   Assessment/Plan: 1. Functional deficits, tetraplegia secondary to TBI/cervical SCI which require 3+ hours per day of interdisciplinary therapy in a comprehensive inpatient rehab setting. Physiatrist is providing close team supervision and 24 hour management of active medical problems listed below. Physiatrist and rehab team continue to assess barriers to discharge/monitor patient progress toward functional and medical goals.  Function:  Bathing Bathing position Bathing activity did not occur: Safety/medical concerns Position: Bed  Bathing parts   Body parts bathed by helper: Right arm, Left arm, Chest, Abdomen, Front perineal area, Buttocks, Right upper leg, Left upper leg, Left lower leg, Right lower leg, Back  Bathing assist Assist Level: Touching or steadying assistance(Pt > 75%)      Upper Body Dressing/Undressing Upper body dressing   What is the patient wearing?: Button up shirt           Button up shirt - Perfomed by helper: Thread/unthread right sleeve, Thread/unthread left sleeve, Pull shirt around back, Button/unbutton shirt    Upper body assist Assist Level: 2 helpers (sitting EOB)      Lower Body Dressing/Undressing Lower body dressing   What is the patient wearing?: Pants       Pants- Performed by helper: Thread/unthread right pants leg, Thread/unthread left pants leg, Pull pants up/down                      Lower body assist Assist for lower body dressing: 2 Helpers  Toileting Toileting Toileting activity did not occur: No continent bowel/bladder event   Toileting steps completed by helper: Adjust clothing after toileting, Performs perineal hygiene, Adjust clothing prior to toileting    Toileting assist Assist level: Two helpers   Transfers Chair/bed transfer   Chair/bed transfer method: Lateral scoot Chair/bed transfer assist level: 2 helpers Chair/bed transfer assistive device: Sliding  board Mechanical lift: Maximove   Locomotion Ambulation Ambulation activity did not occur: Safety/medical Editor, commissioning activity did not occur: Safety/medical concerns Type: Manual Max wheelchair distance: 300+ ft Assist Level: Dependent (Pt equals 0%)  Cognition Comprehension Comprehension assist level: Follows complex conversation/direction with no assist  Expression Expression assist level: Expresses complex ideas: With no assist  Social Interaction Social Interaction assist level: Interacts appropriately with others - No medications needed.  Problem Solving Problem solving assist level: Solves basic 75 - 89% of the time/requires cueing 10 - 24% of the time  Memory Memory assist level: Recognizes or recalls 50 - 74% of the time/requires cueing 25 - 49% of the time   Medical Problem List and Plan: 1. Functional, cognitive and mobility deficitssecondary to Moderate TBI, C4 spinal cord injury -CIR PT, OT, speech---   -displaying improved insight and awarenesss  2. DVT Prophylaxis/Anticoagulation: Pharmaceutical: Lovenox 3. Chronic neck pain/Pain Management: On Gabapentin bid   -changed hydrocodone to oxycodone for breakthrough pain  - fentanyl patch I 3mcg/hr    -Headaches are primarly related to occipital-cervical fractures 4. Mood: LCSW to follow for evaluation and support when appropriate.  5. Neuropsych: This patient is notcapable of making decisions on hisown behalf.  -continue deficits in memory/insight 6. Skin/Wound Care: Air mattress overlay. Maintain adequate nutritional and hydration status.  7. Fluids/Electrolytes/Nutrition: Monitor I/O.   -continue TF at reduced rate to supplement his PO   -on full liquid diet, tolerating well.  9. Left periorbital cellulitis/ CN III/VI palsy:  -continue local care  -follow up per optho 10Left femur fracture s/p IM nail: NWB X 8 weeks.has left foot drop 11. Left open tibial fracture s/p  I and D with ORIF: NWB--convert to CAM boot? Follow up with ortho.  11. Left mandibular fracture s/p CR /tongue laceration: Downsized to CFS # 4 on 9/17 -wires removed, screws in place for two more weeks from 9/26. No chewing   -need to protect wires from scraping gums  -trach out and tolerating well. 12. Chemosis left eye: Continue Lacrilube every 3 hours till resolved per opthalmology.  -eye gradually improving. optho recommends ongoing consvt mgt 13. Facial cellulitis: keflex was completed 14. Leucocytosis: Monitor for signs of infection. 15. Thrombocytosis: Likely reactive. Dopplers pending.   16. ABLA: hgb stable at 10.5 17. Abnormal LFTs: Question due to shocked liver.  28. H/o of MDD: Was on Trintellix 5 mg and Risperidone 5 mg bid at home--continue to hold for now.   -behavior reasonable at present 19. Urine retention/neurogenic bladder: foley replaced and left in place due to risk of skin breakdown and pt's poor mobility 20. ID/septicemia:  -Left lower lobe infiltrate on CXR, wbc's 16k on 9/22---down to 4.6 9/24  -afebrile   -ucx with 100k E Coli  -converted to bactrim---completed  -blood cultures negative .  21. Neurogenic bladder:   -dc foley today and attempt voiding trial  -PVR, I/O cath prn       LOS (Days) 22 A FACE TO FACE EVALUATION WAS PERFORMED  Alger Simons T, MD 06/12/2017 8:45 AM

## 2017-06-12 NOTE — Progress Notes (Signed)
Physical Therapy Session Note  Patient Details  Name: [name] MRN: [id number] Date of Birth: 07-25-1966  Today's Date: 06/12/2017 PT Individual Time: 1400-1510 PT Individual Time Calculation (min): 70 min   Short Term Goals: Week 3:  PT Short Term Goal 1 (Week 3): Pt will demonstrate dynamic sitting balance x3 min on mat table with minA PT Short Term Goal 2 (Week 3): Pt will demonstrate rolling R/L maxA +1 PT Short Term Goal 3 (Week 3): Pt will direct care for set up of transfer with min cues   Skilled Therapeutic Interventions/Progress Updates: Pt presented in bed agreeable to therapy. Pt with recent BM and attempted to void but unsuccessful. Performed bed mobility to allow to peri-cleaning. Pt required [facility] assist to reach for rails and pt able to maintain sidelying to allow for pericare. Pt required increased time for all activities. Performed supine to sit maxA x 2 for LE management and truncal support. Performed SB transfer to TIS with maxA and cues for head hips relationship. Pt request to use arm cycle, performed x 3 min with once break. Pt returned to room and performed SB transfer in same manner as prior. Pt required x 2 for boosting to [facility] and for repositioning. Pt left in bed with all current needs met.      Therapy Documentation Precautions:  Precautions Precautions: Fall, Cervical Required Braces or Orthoses: Cervical Brace Cervical Brace: Hard collar, At all times Restrictions Weight Bearing Restrictions: Yes RLE Weight Bearing: Non weight bearing LLE Weight Bearing: Non weight bearing     See Function Navigator for Current Functional Status.   Therapy/Group: Individual Therapy  Aleksei Goodlin  Tequisha Maahs, PTA  06/12/2017, 3:42 PM

## 2017-06-12 NOTE — Progress Notes (Signed)
Occupational Therapy Session Note  Patient Details  Name: [name] MRN: [id number] Date of Birth: [birth year]  Today's Date: 06/12/2017 OT Individual Time: 0700-0800 OT Individual Time Calculation (min): 60 min    Short Term Goals: Week 3:  OT Short Term Goal 1 (Week 3): Pt will maintain a functional grasp during 1 grooming task for 1 minute OT Short Term Goal 2 (Week 3): Pt will complete 5% of lifting pants over upper legs during dressing without physical assist from helper OT Short Term Goal 3 (Week 3): Pt will complete 1 ADL task EOB for 4 minutes to improve upright tolerance   Skilled Therapeutic Interventions/Progress Updates:    Pt resting in bed upon arrival.  Focus on bed mobility, BUE AAROM, sitting balance, slide board transfers, and directing care.  Pt requires no verbal cues to direct care and directs care appropriately.  Pt initiates rasing knee and activating trunk to roll to R and L but requires max A to complete task.  Pt incontinent of bowel.  Pt required tot A for supine>sit EOB in preparation for slide board transfer to w/c.  Pt maintained sitting balance with close supervision.  Pt required tot A to perform lateral lean for slide board placement.  Pt initiates scooting on slide board but continues to require max A/tot a for transfer.  Focus transitioned to donning button up shirt with emphasis on pt "pushing" BUE into shirt sleeves.  Pt requires max A to complete task,  Pt remained in TIS w/c with soft call bell placed in pt's L hand.   Therapy Documentation Precautions:  Precautions Precautions: Fall, Cervical Required Braces or Orthoses: Cervical Brace Cervical Brace: Hard collar, At all times Restrictions Weight Bearing Restrictions: Yes RLE Weight Bearing: Non weight bearing LLE Weight Bearing: Non weight bearing   Pain:  Pt denies pain  See Function Navigator for Current Functional Status.   Therapy/Group: Individual Therapy  Leroy Libman 06/12/2017, 9:18 AM

## 2017-06-13 ENCOUNTER — Inpatient Hospital Stay (HOSPITAL_COMMUNITY): Payer: Self-pay | Admitting: Physical Therapy

## 2017-06-13 ENCOUNTER — Inpatient Hospital Stay (HOSPITAL_COMMUNITY): Payer: Self-pay

## 2017-06-13 ENCOUNTER — Inpatient Hospital Stay (HOSPITAL_COMMUNITY): Payer: Self-pay | Admitting: Occupational Therapy

## 2017-06-13 ENCOUNTER — Inpatient Hospital Stay (HOSPITAL_COMMUNITY): Payer: BC Managed Care – PPO | Admitting: Speech Pathology

## 2017-06-13 LAB — GLUCOSE, CAPILLARY
GLUCOSE-CAPILLARY: 91 mg/dL (ref 65–99)
GLUCOSE-CAPILLARY: 89 mg/dL (ref 65–99)
Glucose-Capillary: 109 mg/dL — ABNORMAL HIGH (ref 65–99)
Glucose-Capillary: 95 mg/dL (ref 65–99)

## 2017-06-13 NOTE — Progress Notes (Signed)
[place] PHYSICAL MEDICINE & REHABILITATION     PROGRESS NOTE    Subjective/Complaints: Up in chair. Wearing glasses. Enjoyed his cake yesterday. Had questions about bladder---unable to void on his own  ROS: pt denies nausea, vomiting, diarrhea, cough, shortness of breath or chest pain   Objective: Vital Signs: Blood pressure 125/68, pulse 88, temperature 99 F (37.2 C), temperature source Oral, resp. rate 16, height 5\' 6"  (1.676 m), weight 93.4 kg (205 lb 15 oz), SpO2 100 %. No results found. No results for input(s): WBC, HGB, HCT, PLT in the last 72 hours. No results for input(s): NA, K, CL, GLUCOSE, BUN, CREATININE, CALCIUM in the last 72 hours.  Invalid input(s): CO CBG (last 3)   Recent Labs  06/12/17 2348 06/13/17 0414 06/13/17 0801  GLUCAP 112* 109* 95    Wt Readings from Last 3 Encounters:  06/13/17 93.4 kg (205 lb 15 oz)  05/21/17 99 kg (218 lb 3.2 oz)  01/11/17 91.6 kg (202 lb)    Physical Exam:  Constitutional: He appears well-developedand well-nourished. He is in no distress.  HENT:  Head: Normocephalicand atraumatic.   Eyes:  Unable to move right eye laterally. Left eye limited to lateral field and medial fields--- left eye improving Neck:  Phonation good  Immobilized by Aspen collar. Trach site closing Cardiovascular:.RRR without murmur. No JVD         Respiratory: CTA Bilaterally without wheezes or rales. Normal effort   GI: PEG site intact Genitourinary:  Genitourinary Comments: foley   in place  With yellow liquids Musculoskeletal: He exhibits continued    left thigh/leg with ACE Neurological:    Motor 1-2/5 UE, early tricep movement. Trace to absent movement left ankle.   Senses pain stim in all 4. Motor-sensory unchanged. Left III, VI nerve weakness but seems to have some movement. Right VI deficit--persistent Skin: Skin is warmand dry.  Wounds all dry/healing. Sutures intact LLE Psychiatric: pleasant   Assessment/Plan: 1. Functional  deficits, tetraplegia secondary to TBI/cervical SCI which require 3+ hours per day of interdisciplinary therapy in a comprehensive inpatient rehab setting. Physiatrist is providing close team supervision and 24 hour management of active medical problems listed below. Physiatrist and rehab team continue to assess barriers to discharge/monitor patient progress toward functional and medical goals.  Function:  Bathing Bathing position Bathing activity did not occur: Safety/medical concerns Position: Bed  Bathing parts   Body parts bathed by helper: Right arm, Left arm, Chest, Abdomen, Front perineal area, Buttocks, Right upper leg, Left upper leg, Left lower leg, Right lower leg, Back  Bathing assist Assist Level: Touching or steadying assistance(Pt > 75%)      Upper Body Dressing/Undressing Upper body dressing   What is the patient wearing?: Button up shirt           Button up shirt - Perfomed by helper: Thread/unthread right sleeve, Thread/unthread left sleeve, Pull shirt around back, Button/unbutton shirt    Upper body assist Assist Level: 2 helpers (sitting EOB)      Lower Body Dressing/Undressing Lower body dressing   What is the patient wearing?: Pants       Pants- Performed by helper: Thread/unthread right pants leg, Thread/unthread left pants leg, Pull pants up/down                      Lower body assist Assist for lower body dressing: 2 Helpers      Toileting Toileting Toileting activity did not occur: No continent bowel/bladder event  Toileting steps completed by helper: Adjust clothing prior to toileting, Performs perineal hygiene, Adjust clothing after toileting    Toileting assist Assist level: Two helpers   Transfers Chair/bed transfer   Chair/bed transfer method: Lateral scoot Chair/bed transfer assist level: Total assist (Pt < 25%) Chair/bed transfer assistive device: Sliding board Mechanical lift: Maximove   Locomotion Ambulation Ambulation  activity did not occur: Safety/medical Editor, commissioning activity did not occur: Safety/medical concerns Type: Manual Max wheelchair distance: 300+ ft Assist Level: Dependent (Pt equals 0%)  Cognition Comprehension Comprehension assist level: Follows complex conversation/direction with extra time/assistive device, Understands complex 90% of the time/cues 10% of the time  Expression Expression assist level: Expresses complex 90% of the time/cues < 10% of the time  Social Interaction Social Interaction assist level: Interacts appropriately with others - No medications needed.  Problem Solving Problem solving assist level: Solves basic 90% of the time/requires cueing < 10% of the time  Memory Memory assist level: Recognizes or recalls 75 - 89% of the time/requires cueing 10 - 24% of the time   Medical Problem List and Plan: 1. Functional, cognitive and mobility deficitssecondary to Moderate TBI, C4 spinal cord injury -CIR PT, OT, speech 2. DVT Prophylaxis/Anticoagulation: Pharmaceutical: Lovenox 3. Chronic neck pain/Pain Management: On Gabapentin bid   -changed hydrocodone to oxycodone for breakthrough pain  - fentanyl patch  69mcg/hr    -Headaches are primarly related to occipital-cervical fractures 4. Mood: LCSW to follow for evaluation and support when appropriate.  5. Neuropsych: This patient is notcapable of making decisions on hisown behalf.  -continue deficits in memory/insight 6. Skin/Wound Care: Air mattress overlay. Maintain adequate nutritional and hydration status.  7. Fluids/Electrolytes/Nutrition: Monitor I/O.   -continue TF at reduced rate to supplement his PO   -on full liquid diet, tolerating well so far 9. Left periorbital cellulitis/ CN III/VI palsy:  -continue local care  -follow up per optho 10Left femur fracture s/p IM nail: NWB X 8 weeks.has left foot drop 11. Left open tibial fracture s/p I and D with ORIF: NWB--convert  to CAM boot? Follow up with ortho.  11. Left mandibular fracture s/p CR /tongue laceration:   -wires removed, screws in place for two more weeks from 9/26. No chewing   -need to protect wires from scraping gums  -trach out and tolerating well. 12. Chemosis left eye: Continue Lacrilube every 3 hours till resolved per opthalmology.  -eye gradually improving. optho recommends ongoing consvt mgt 13. Facial cellulitis: keflex was completed 14. Leucocytosis: Monitor for signs of infection. 15. Thrombocytosis: Likely reactive. Dopplers pending.   16. ABLA: hgb stable at 10.5 17. Abnormal LFTs: Question due to shocked liver.  77. H/o of MDD: Was on Trintellix 5 mg and Risperidone 5 mg bid at home--continue to hold for now.   -behavior reasonable at present 19. Urine retention/neurogenic bladder: foley out  -continue voiding trial with I/O caths 20. ID/septicemia:  -Left lower lobe infiltrate on CXR, wbc's 16k on 9/22---down to 4.6 9/24  -afebrile   -ucx with 100k E Coli  -converted to bactrim---completed  -blood cultures negative .         LOS (Days) 23 A [place]  Meredith Staggers, MD 06/13/2017 9:19 AM

## 2017-06-13 NOTE — Progress Notes (Signed)
Physical Therapy Weekly Progress Note  Patient Details  Name: [name] MRN: [id number] Date of Birth: July 18, 1966  Beginning of progress report period: June 05, 2017 End of progress report period: June 13, 2017  Today's Date: 06/13/2017 PT Individual Time: 1300-1410 and 1500-1545 PT Individual Time Calculation (min): 70 min and 45 min (total 115 min)   Patient has met 3 of 3 short term goals.  Pt continues to requires max/totalA for all activities, however demonstrating improvements in LE coordination to assist with moving LEs off EOB during bed mobility, increased trunk control to assist with scooting during transfers. Improving tolerance to upright with less complaints of pain in chest and neck. Also note improving LLE and R elbow pain, tolerating PROM and functional use. Continues to be limited by BLE weight bearing status. Pt is motivated, however does demonstrate poor insight into deficits and amount of caregiver assistance required for basic functional tasks at this time. Family not present for any PT sessions with primary therapist so far due to work schedules. Pt has been performing LE HEP with wife at night using handouts provided by therapist.   Patient continues to demonstrate the following deficits muscle weakness, muscle joint tightness and muscle paralysis, decreased cardiorespiratoy endurance, unbalanced muscle activation, decreased coordination and decreased motor planning, decreased visual acuity and decreased visual motor skills, decreased problem solving, decreased safety awareness, decreased memory and delayed processing and decreased sitting balance, decreased postural control, decreased balance strategies and difficulty maintaining precautions and therefore will continue to benefit from skilled PT intervention to increase functional independence with mobility.  Patient progressing toward long term goals..  Continue plan of care.  PT Short Term Goals Week 3:   PT Short Term Goal 1 (Week 3): Pt will demonstrate dynamic sitting balance x3 min on mat table with minA PT Short Term Goal 1 - Progress (Week 3): Met PT Short Term Goal 2 (Week 3): Pt will demonstrate rolling R/L maxA +1 PT Short Term Goal 2 - Progress (Week 3): Met PT Short Term Goal 3 (Week 3): Pt will direct care for set up of transfer with min cues  PT Short Term Goal 3 - Progress (Week 3): Met Week 4:  PT Short Term Goal 1 (Week 4): Pt will demonstrate rolling modA +2 R/L PT Short Term Goal 2 (Week 4): Pt will demonstrate dynamic sitting balance minA x10 min PT Short Term Goal 3 (Week 4): Pt will demonstrate supine>sit with modA  Skilled Therapeutic Interventions/Progress Updates: Tx 1: Pt received seated in w/c, denies pain and agreeable to treatment. Assisted with feeding pt totalA to finish lunch; reinforced need for small bites from spoon and adequate swallow before next bite. Transfer w/c <>mat table max/totalA with pt assisting scooting hips however still contributing <25% assist. BUE weighted support machine utilized to un0weigh arms and facilitate functional UE reaching activities such as reaching for shoulder, nose, retrieving and throwing horseshoes towards target. Integrated crossed midline activities and bilateral task with horseshoe retrieval. Returned to w/c as above. Made adjustments to w/c headrest to increase pt sitting tolerance. Assisted with eating icee at end of session, totalA with pt directing care. Remained semi-reclined in w/c, all needs in reach at end of session.   Tx 2: Pt received seated in w/c, denies pain and agreeable to treatment. Transfer w/c>bed with maxA. TotalA +2 sit >supine. Repositioned with BLE AAROM hip flexion to assist with repositioning and removing LLE cam boot, donning new bandage with RN present. Patient participated in music  therapy throughout session to increase compliance and patient satisfaction. Remained semi-reclined in bed at end of session,  all needs in reach.      Therapy Documentation Precautions:  Precautions Precautions: Fall, Cervical Required Braces or Orthoses: Cervical Brace Cervical Brace: Hard collar, At all times Restrictions Weight Bearing Restrictions: Yes RLE Weight Bearing: Non weight bearing LLE Weight Bearing: Non weight bearing Pain: Pain Assessment Pain Assessment: No/denies pain   See Function Navigator for Current Functional Status.  Therapy/Group: Individual Therapy  [name] 06/13/2017, 2:13 PM

## 2017-06-13 NOTE — Progress Notes (Signed)
Orthopaedic Trauma Progress Note  S: No orthopaedic issues  O: Awake and responding to questions LLE: Dressings taken down. Wounds appear healed, sutures removed. Anterior fracture wound. No active DF/PF. Endorses sensation to foot  A/P: 1. multiple R foot fractures including talus, lisfranc injury, Cuneiform fxs, MTT head fractures 2-3. Nonop in boot 2. S/p ORIF of pilon/tibial shaft now 2+ weeks out -Recommend boot and ROM exercises -Every other day dressing changes for next week with just kerlix -Will follow remotely -Plan for x-rays next week for RLE and possible advancement of WB on right side at that time  Shona Needles, MD [facility] [phone] (phone)
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
## 2017-06-13 NOTE — Progress Notes (Signed)
Occupational Therapy Session Note  Patient Details  Name: [name] MRN: [id number] Date of Birth: [birth year]  Today's Date: 06/13/2017 OT Individual Time: 0700-0759 OT Individual Time Calculation (min): 59 min    Short Term Goals: Week 3:  OT Short Term Goal 1 (Week 3): Pt will maintain a functional grasp during 1 grooming task for 1 minute OT Short Term Goal 2 (Week 3): Pt will complete 5% of lifting pants over upper legs during dressing without physical assist from helper OT Short Term Goal 3 (Week 3): Pt will complete 1 ADL task EOB for 4 minutes to improve upright tolerance    Skilled Therapeutic Interventions/Progress Updates:    Focus on bed mobility, sitting balance, functional transfers, BUE use during functional tasks, directing care, activity tolerance, and safety awareness to increase independence with BADLs.  Pt initiates rolling to R/L but requires max A to complete task.  Pt initiates pulling up pants but unable to maintain grasp to complete task.  Pt is able to raise BLE off bed to facilitate threading into pants.  Pt required [facility] assistance to wash face.  Pt required max A for slide board transfer to w/c.  Pt remained in w/c soft call bell in LUE grasp.    Therapy Documentation Precautions:  Precautions Precautions: Fall, Cervical Required Braces or Orthoses: Cervical Brace Cervical Brace: Hard collar, At all times Restrictions Weight Bearing Restrictions: Yes RLE Weight Bearing: Non weight bearing LLE Weight Bearing: Non weight bearing Pain:  Pt denies pain  See Function Navigator for Current Functional Status.   Therapy/Group: Individual Therapy  Leroy Libman 06/13/2017, 8:00 AM

## 2017-06-13 NOTE — Progress Notes (Signed)
Occupational Therapy Note  Patient Details  Name: Dahl Higinbotham MRN: [id number] Date of Birth: 1965-12-04  Today's Date: 06/13/2017 OT Individual Time: 1100-1200 OT Individual Time Calculation (min): 60 min   Pt denies pain Individual therapy  Pt asleep in bed upon arrival but easily aroused.  Pt required max A for supine>sit EOB in preparation for slide board transfer to w/c.  Pt maintained static sitting balance with supervision.  Pt required max A for lateral lean to allow placement of slide board.  Pt initiated "slide" on board but required max A for transfer to w/c.  Pt transitioned to BUE activities/tasks with emphasis on increase shoulder flexion/extension and BUE grasp.  Pt noted with increased BUE active movement but still unable to use BUE functionally.  Pt remained in w/c and returned to room, soft call bell in RUE grasp.    [name] [facility] 06/13/2017, 12:10 PM

## 2017-06-13 NOTE — Progress Notes (Signed)
Speech Language Pathology Daily Session Note  Patient Details  Name: [name] MRN: [id number] Date of Birth: [birth year]  Today's Date: 06/13/2017 SLP Individual Time: 0800-0900 SLP Individual Time Calculation (min): 60 min  Short Term Goals: Week 3: SLP Short Term Goal 1 (Week 3): Pt will identify at least 2 new deficits and their impact on his functional independence with mod assist verbal cues.   SLP Short Term Goal 2 (Week 3): Pt will verbalize at least 2 safety precautions with mod assist verbal cues.   SLP Short Term Goal 3 (Week 3): Pt will direct self care by answering questions regarding PO intake (i.e., amount of intake, cup vs spoon) with Mod A cues.  SLP Short Term Goal 4 (Week 3): Pt will consume full liquid diet with Min overt s/s of aspiration and Min A cues for use of compensatory strategies.   Skilled Therapeutic Interventions: Skilled treatment session focused on dysphagia and cognition goals. SLP facilitated session by providing skilled observation of pt consuming full liquid diet with liquids via straw. Pt with throat clear x 1 throughout entire session. Pt able to direct his self care very well - almost to point of demanding care or certain food items. Pt required Mod A to identify at least 2 new deficits. Pt left upright in wheelchair and all needs within reach. Continue per current plan of care.      Function:  Eating Eating   Modified Consistency Diet: Yes Eating Assist Level: Helper feeds patient     [place] on Utensil: Every scoop     Cognition Comprehension Comprehension assist level: Understands complex 90% of the time/cues 10% of the time  Expression   Expression assist level: Expresses complex 90% of the time/cues < 10% of the time  Social Interaction Social Interaction assist level: Interacts appropriately with others - No medications needed.  Problem Solving Problem solving assist level: Solves basic 90% of the time/requires  cueing < 10% of the time;Solves basic problems with no assist  Memory Memory assist level: Recognizes or recalls 75 - 89% of the time/requires cueing 10 - 24% of the time    Pain    Therapy/Group: Individual Therapy  Teagan Ozawa 06/13/2017, 12:11 PM

## 2017-06-14 ENCOUNTER — Inpatient Hospital Stay (HOSPITAL_COMMUNITY): Payer: Self-pay

## 2017-06-14 ENCOUNTER — Inpatient Hospital Stay (HOSPITAL_COMMUNITY): Payer: BC Managed Care – PPO | Admitting: Speech Pathology

## 2017-06-14 ENCOUNTER — Inpatient Hospital Stay (HOSPITAL_COMMUNITY): Payer: Self-pay | Admitting: Physical Therapy

## 2017-06-14 MED ORDER — POLYVINYL ALCOHOL 1.4 % OP SOLN: 1.0000 [drp] | Freq: Three times a day (TID) | OPHTHALMIC | Status: DC

## 2017-06-14 MED ORDER — ARTIFICIAL TEARS OPHTHALMIC OINT
TOPICAL_OINTMENT | Freq: Three times a day (TID) | OPHTHALMIC | Status: DC
  Administered 2017-06-14 – 2017-06-20 (×12): via OPHTHALMIC
  Filled 2017-06-14: qty 3.5

## 2017-06-14 NOTE — Progress Notes (Signed)
[place] PHYSICAL MEDICINE & REHABILITATION     PROGRESS NOTE    Subjective/Complaints: Up with OT. No new complaints. Ate well yesterday 80-100%. Pain controlled  ROS: pt denies nausea, vomiting, diarrhea, cough, shortness of breath or chest pain   Objective: Vital Signs: Blood pressure 121/66, pulse 75, temperature 97.9 F (36.6 C), temperature source Oral, resp. rate 18, height 5\' 6"  (1.676 m), weight 93.3 kg (205 lb 11.7 oz), SpO2 100 %. No results found. No results for input(s): WBC, HGB, HCT, PLT in the last 72 hours. No results for input(s): NA, K, CL, GLUCOSE, BUN, CREATININE, CALCIUM in the last 72 hours.  Invalid input(s): CO CBG (last 3)   Recent Labs  06/13/17 0801 06/13/17 1213 06/13/17 1621  GLUCAP 95 91 89    Wt Readings from Last 3 Encounters:  06/14/17 93.3 kg (205 lb 11.7 oz)  05/21/17 99 kg (218 lb 3.2 oz)  01/11/17 91.6 kg (202 lb)    Physical Exam:  Constitutional: He appears well-developedand well-nourished. He is in no distress.  HENT:  Head: Normocephalicand atraumatic.   Eyes:  Unable to move right eye laterally. Left eye limited to lateral field and medial fields--- left eye irritation decreased Neck:  Phonation good  Immobilized by Aspen collar. Trach site closed Cardiovascular: RRR without murmur. No JVD         Respiratory: CTA Bilaterally without wheezes or rales. Normal effort    GI: bs+, peg site normal Genitourinary:  Genitourinary Comments: foley   in place  With yellow liquids Musculoskeletal: He exhibits continued    left thigh/leg with ACE Neurological:    Motor 1-2/5 UE especially with wrist, biceps, early tricep movement. Minimal shoulders Trace to absent movement left ankle.   Senses pain stim in all 4. Motor-sensory unchanged. Left III, VI nerve weakness but seems to have some movement. Right VI deficit--persistent Skin: Skin is warmand dry.  Wounds all dry/healing. Sutures intact LLE Psychiatric:  pleasant   Assessment/Plan: 1. Functional deficits, tetraplegia secondary to TBI/cervical SCI which require 3+ hours per day of interdisciplinary therapy in a comprehensive inpatient rehab setting. Physiatrist is providing close team supervision and 24 hour management of active medical problems listed below. Physiatrist and rehab team continue to assess barriers to discharge/monitor patient progress toward functional and medical goals.  Function:  Bathing Bathing position Bathing activity did not occur: Safety/medical concerns Position: Bed  Bathing parts   Body parts bathed by helper: Right arm, Left arm, Chest, Abdomen, Front perineal area, Buttocks, Right upper leg, Left upper leg, Left lower leg, Right lower leg, Back  Bathing assist Assist Level: Touching or steadying assistance(Pt > 75%)      Upper Body Dressing/Undressing Upper body dressing   What is the patient wearing?: Button up shirt           Button up shirt - Perfomed by helper: Thread/unthread right sleeve, Thread/unthread left sleeve, Pull shirt around back, Button/unbutton shirt    Upper body assist Assist Level: 2 helpers (sitting EOB)      Lower Body Dressing/Undressing Lower body dressing   What is the patient wearing?: Pants       Pants- Performed by helper: Thread/unthread right pants leg, Thread/unthread left pants leg, Pull pants up/down                      Lower body assist Assist for lower body dressing: 2 Helpers      Toileting Toileting Toileting activity did not occur:  No continent bowel/bladder event   Toileting steps completed by helper: Adjust clothing prior to toileting, Performs perineal hygiene, Adjust clothing after toileting    Toileting assist Assist level: Two helpers   Transfers Chair/bed transfer   Chair/bed transfer method: Lateral scoot Chair/bed transfer assist level: Total assist (Pt < 25%) Chair/bed transfer assistive device: Sliding board Mechanical lift:  Maximove   Locomotion Ambulation Ambulation activity did not occur: Safety/medical Editor, commissioning activity did not occur: Safety/medical concerns Type: Manual Max wheelchair distance: 300+ ft Assist Level: Dependent (Pt equals 0%)  Cognition Comprehension Comprehension assist level: Understands complex 90% of the time/cues 10% of the time  Expression Expression assist level: Expresses complex 90% of the time/cues < 10% of the time  Social Interaction Social Interaction assist level: Interacts appropriately with others - No medications needed.  Problem Solving Problem solving assist level: Solves basic 90% of the time/requires cueing < 10% of the time, Solves basic problems with no assist  Memory Memory assist level: Recognizes or recalls 75 - 89% of the time/requires cueing 10 - 24% of the time   Medical Problem List and Plan: 1. Functional, cognitive and mobility deficitssecondary to Moderate TBI, C4 spinal cord injury -CIR PT, OT, speech  -ortho may advance weight bearing RLE next week (foot fractures) 2. DVT Prophylaxis/Anticoagulation: Pharmaceutical: Lovenox 3. Chronic neck pain/Pain Management: On Gabapentin bid   -changed hydrocodone to oxycodone for breakthrough pain  - fentanyl patch  100mcg/hr    -Headaches are primarly related to occipital-cervical fractures 4. Mood: LCSW to follow for evaluation and support when appropriate.  5. Neuropsych: This patient is notcapable of making decisions on hisown behalf.  -continue deficits in memory/insight 6. Skin/Wound Care: Air mattress overlay. Maintain adequate nutritional and hydration status.  7. Fluids/Electrolytes/Nutrition: Monitor I/O.   -continue TF at reduced rate to supplement his PO   -on full liquid diet, tolerating well so far  -ate well yesterday---if continues can stop TF 9. Left periorbital cellulitis/ CN III/VI palsy:  -continue local care  -follow up per optho 10Left  femur fracture s/p IM nail: NWB X 8 weeks.has left foot drop 11. Left open tibial fracture s/p I and D with ORIF: NWB--CAM boot.  11. Left mandibular fracture s/p CR /tongue laceration:   -wires removed, screws in place for two more weeks from 9/26. No chewing   -need to protect wires from scraping gums  -trach out and tolerating well. 12. Chemosis left eye: Continue Lacrilube every 3 hours till resolved per opthalmology.  -eye gradually improving. optho recommends ongoing consvt mgt 13. Facial cellulitis: keflex was completed 14. Leucocytosis: Monitor for signs of infection. 15. Thrombocytosis: Likely reactive. Dopplers pending.   16. ABLA: hgb stable at 10.5 17. Abnormal LFTs: Question due to shocked liver.  16. H/o of MDD: Was on Trintellix 5 mg and Risperidone 5 mg bid at home--continue to hold for now.   -behavior reasonable at present 19. Urine retention/neurogenic bladder: foley out  -continue voiding trial with I/O caths 20. ID/septicemia:  -Left lower lobe infiltrate on CXR, wbc's 16k on 9/22---down to 4.6 9/24  -afebrile   -ucx with 100k E Coli  -converted to bactrim---completed  -blood cultures negative .         LOS (Days) 24 A Silerton T, MD 06/14/2017 8:45 AM

## 2017-06-14 NOTE — Progress Notes (Signed)
Occupational Therapy Weekly Progress Note  Patient Details  Name: [name] MRN: [id number] Date of Birth: Dec 27, 1965  Beginning of progress report period: June 07, 2017 End of progress report period: June 14, 2017  Patient has met 1 and 2 of 3 short term goals.  Pt is making slow but steady progress with functional use of BUE, bed mobility, sitting balance, and functional transfers.  Pt initiates rolling in bed but requires max A to complete task.  Pt is able to grasp bed rails when in side lying.  Pt is able to initiate moving BLE off edge of bed in preparation for supine>sit EOB but requires max A to complete task.  Pt initiates slide on board during transfer but transfers require max A with +2 to secure slide board.  Pt with improved grasp and elbow flexion/extension, shoulder abduction/adduction during closed chain tasks.   Patient continues to demonstrate the following deficits: muscle weakness, decreased cardiorespiratoy endurance, impaired timing and sequencing, unbalanced muscle activation, decreased coordination and decreased motor planning, decreased visual acuity and decreased visual motor skills, decreased awareness, decreased problem solving, decreased safety awareness and decreased memory and decreased sitting balance, decreased standing balance, decreased postural control, hemiplegia, decreased balance strategies and difficulty maintaining precautions and therefore will continue to benefit from skilled OT intervention to enhance overall performance with BADL and Reduce care partner burden.  Patient progressing toward long term goals..  Continue plan of care.  OT Short Term Goals Week 3:  OT Short Term Goal 1 (Week 3): Pt will maintain a functional grasp during 1 grooming task for 1 minute OT Short Term Goal 1 - Progress (Week 3): Progressing toward goal OT Short Term Goal 2 (Week 3): Pt will complete 5% of lifting pants over upper legs during dressing without  physical assist from helper OT Short Term Goal 2 - Progress (Week 3): Met OT Short Term Goal 3 (Week 3): Pt will complete 1 ADL task EOB for 4 minutes to improve upright tolerance  OT Short Term Goal 3 - Progress (Week 3): Met Week 4:  OT Short Term Goal 1 (Week 4): Pt will maintain a functional grasp with max Aduring 1 grooming task for 1 minute OT Short Term Goal 2 (Week 4): Pt will perform slide board transfer to drop arm BSC with max A OT Short Term Goal 3 (Week 4): Pt will roll to R/L in bed with mod A to facilitate LB dressing tasks at bed level  Skilled Therapeutic Interventions/Progress Updates:      Therapy Documentation Precautions:  Precautions Precautions: Fall, Cervical Required Braces or Orthoses: Cervical Brace Cervical Brace: Hard collar, At all times Restrictions Weight Bearing Restrictions: Yes RLE Weight Bearing: Non weight bearing LLE Weight Bearing: Non weight bearing  See Function Navigator for Current Functional Status.    [name] [facility] 06/14/2017, 7:01 AM

## 2017-06-14 NOTE — Progress Notes (Signed)
Social Work Patient ID: [name], male   DOB: 1965-10-10, 51 y.o.   MRN: [id number]   Met with pt yesterday to discuss team conf and offer belated bday wishes.  Pt's first question is "when do I go home?"  I explained that we have not set a targeted d/c date yet and that I am talking with his wife to determine help available for d/c.  I did not yet discuss with pt the possibility of SNF as wife has not yet committed to this plan.  I have attempted to reach her for the past couple of days. Hope to hear from her by end of day today to determine if we will officially change plan to SNF and plan to discuss with pt.  Will keep team posted.  Manuel Dall, LCSW

## 2017-06-14 NOTE — Progress Notes (Signed)
Occupational Therapy Session Note  Patient Details  Name: Benford Asch MRN: [id number] Date of Birth: [birth year]  Today's Date: 06/14/2017 OT Individual Time: 0700-0826 OT Individual Time Calculation (min): 86 min    Short Term Goals: Week 4:  OT Short Term Goal 1 (Week 4): Pt will maintain a functional grasp with max Aduring 1 grooming task for 1 minute OT Short Term Goal 2 (Week 4): Pt will perform slide board transfer to drop arm BSC with max A OT Short Term Goal 3 (Week 4): Pt will roll to R/L in bed with mod A to facilitate LB dressing tasks at bed level  Skilled Therapeutic Interventions/Progress Updates:    Initial focus on bed mobility for LB dressing tasks, supine>sit, sitting balance, functional transfers, and directing care.  Pt initiates rolling R/L but requires max A to complete task.  Pt initiates moving BLE off edge of bed but requires max A to complete task.  Pt is max A for supine>sit EOB and max A for lateral lean to facilitate placement of slide board.  Pt requires max A for slide board transfer.  Pt required HOH A to wash face.  Pt transitioned to gym and engaged in BUE table activities with focus on proximal movement.  Pt was able to bring his L hand to his mouth with elbow supported on table.  Pt exhibits limited shoulder flexion/extension. Pt returned to room and remained in w/c with soft call bell placed in grasp and NT present.   Therapy Documentation Precautions:  Precautions Precautions: Fall, Cervical Required Braces or Orthoses: Cervical Brace Cervical Brace: Hard collar, At all times Restrictions Weight Bearing Restrictions: Yes RLE Weight Bearing: Non weight bearing LLE Weight Bearing: Non weight bearing Pain:  Pt denies pain  See Function Navigator for Current Functional Status.   Therapy/Group: Individual Therapy  Leroy Libman 06/14/2017, 8:28 AM

## 2017-06-14 NOTE — Progress Notes (Signed)
Speech Language Pathology Weekly Progress and Session Note  Patient Details  Name: [name] MRN: [id number] Date of Birth: 08/06/1966  Beginning of progress report period: June 07, 2017 End of progress report period: June 14, 2017  Today's Date: 06/14/2017 SLP Individual Time: [id number] SLP Individual Time Calculation (min): 55 min  Short Term Goals: Week 3: SLP Short Term Goal 1 (Week 3): Pt will identify at least 2 new deficits and their impact on his functional independence with mod assist verbal cues.   SLP Short Term Goal 1 - Progress (Week 3): Met SLP Short Term Goal 2 (Week 3): Pt will verbalize at least 2 safety precautions with mod assist verbal cues.   SLP Short Term Goal 2 - Progress (Week 3): Met SLP Short Term Goal 3 (Week 3): Pt will direct self care by answering questions regarding PO intake (i.e., amount of intake, cup vs spoon) with Mod A cues.  SLP Short Term Goal 3 - Progress (Week 3): Met SLP Short Term Goal 4 (Week 3): Pt will consume full liquid diet with Min overt s/s of aspiration and Min A cues for use of compensatory strategies.  SLP Short Term Goal 4 - Progress (Week 3): Met    New Short Term Goals: Week 4: SLP Short Term Goal 1 (Week 4): Pt will identify at least 2 new deficits and their impact on his functional independence with Min assist verbal cues.   SLP Short Term Goal 2 (Week 4): Pt will verbalize at least 2 safety precautions with min assist verbal cues.   SLP Short Term Goal 3 (Week 4): Pt will direct self care by answering questions regarding PO intake (i.e., amount of intake, cup vs spoon) with Min A cues.  SLP Short Term Goal 4 (Week 4): Pt will consume full liquid diet with Min overt s/s of aspiration and supervision cues for use of compensatory strategies.   Weekly Progress Updates: Patient has made excellent gains and has met 4 of 4 STG's this reporting period. Currently, patient is consuming a full liquid diet with minimal  overt s/s of aspiration and supervision-Min A verbal cues for use of small sips and to swallow prior to speaking. Patient is unable to advance diet for ~1 week per MD orders, therefore, recommend patient continue current diet. Patient also requires overall Mod A to identify 2 new deficits, verbalize safety precautions and direct his care. Patient and family education is complete. Patient would benefit from continued skilled SLP intervention to maximize his cognitive and swallowing function in order to reduce caregiver burden .     Intensity: Minumum of 1-2 x/day, 30 to 90 minutes Frequency: 3 to 5 out of 7 days Duration/Length of Stay: 2 weeks Treatment/Interventions: Cognitive remediation/compensation;Cueing hierarchy;Dysphagia/aspiration precaution training;Functional tasks;Patient/family education;Internal/external aids;Speech/Language facilitation;Environmental controls;Therapeutic Activities   Daily Session  Skilled Therapeutic Interventions: Skilled treatment session focused on dysphagia and cognitive goals. SLP facilitated session by providing Min A verbal cues to swallow prior to talking during breakfast meal of a full liquid diet. Patient demonstrated overt cough X 1 with medications crushed in puree, suspect due to poor taste. Recommend patient continue current diet. Patient directed his care with Min A verbal cues but also required cues to command/direct clinician with 1 thing at a time. Patient verbalized understanding. Patient left upright in wheelchair with all needs within reach. Continue with current plan of care.       Function:   Eating Eating   Modified Consistency Diet: Yes Eating Assist  Level: Helper feeds patient;Supervision or verbal cues     Helper Scoops Food on Utensil: Every scoop     Cognition Comprehension Comprehension assist level: Understands complex 90% of the time/cues 10% of the time  Expression   Expression assist level: Expresses complex 90% of the  time/cues < 10% of the time  Social Interaction Social Interaction assist level: Interacts appropriately with others - No medications needed.  Problem Solving Problem solving assist level: Solves basic 90% of the time/requires cueing < 10% of the time;Solves basic problems with no assist  Memory Memory assist level: Recognizes or recalls 75 - 89% of the time/requires cueing 10 - 24% of the time   Pain Pain Assessment Pain Assessment: 0-10 Pain Location: Neck Pain Orientation: Posterior Pain Descriptors / Indicators: Aching Pain Onset: Gradual Patients Stated Pain Goal: 2 Pain Intervention(s): Repositioned;Medication (See eMAR) Multiple Pain Sites: No  Therapy/Group: Individual Therapy  Fina Heizer, [place] 06/14/2017, 3:35 PM

## 2017-06-14 NOTE — Progress Notes (Signed)
Physical Therapy Session Note  Patient Details  Name: [name] MRN: [id number] Date of Birth: 02/07/1966  Today's Date: 06/14/2017 PT Individual Time: 1100-1200 PT Individual Time Calculation (min): 60 min   Short Term Goals: Week 4:  PT Short Term Goal 1 (Week 4): Pt will demonstrate rolling modA +2 R/L PT Short Term Goal 2 (Week 4): Pt will demonstrate dynamic sitting balance minA x10 min PT Short Term Goal 3 (Week 4): Pt will demonstrate supine>sit with modA  Skilled Therapeutic Interventions/Progress Updates: Pt received seated in w/c, denies pain and agreeable to treatment. Pt requested assist with oral hygiene; directed care for setup of suction mouth care kit. Performed BUE ergometer 3x3-4 min per trial; pt engaged in music therapy for increased motivation and participation. Transfer w/c <>mat table with maxA +2. Pt directed care for therapist and PT student to each perform one transfer, with minimal cueing for specific details when describing setup (i.e. Placement of board, w/c, etc). Pt returned to room totalA; remained semi-reclined in w/c at end of session, all needs in reach and NT alerted to pt request to return to bed.      Therapy Documentation Precautions:  Precautions Precautions: Fall, Cervical Required Braces or Orthoses: Cervical Brace Cervical Brace: Hard collar, At all times Restrictions Weight Bearing Restrictions: Yes RLE Weight Bearing: Non weight bearing LLE Weight Bearing: Non weight bearing   See Function Navigator for Current Functional Status.   Therapy/Group: Individual Therapy   J Tygielski 06/14/2017, 12:58 PM  

## 2017-06-15 ENCOUNTER — Inpatient Hospital Stay (HOSPITAL_COMMUNITY): Payer: Self-pay | Admitting: Physical Therapy

## 2017-06-15 MED ORDER — POLYETHYLENE GLYCOL 3350 17 G PO PACK
17.0000 g | PACK | Freq: Every day | ORAL | Status: DC
  Administered 2017-06-15 – 2017-07-15 (×25): 17 g via ORAL
  Filled 2017-06-15 (×33): qty 1

## 2017-06-15 NOTE — Plan of Care (Signed)
Problem: SCI BLADDER ELIMINATION Goal: RH STG MANAGE BLADDER WITH EQUIPMENT WITH ASSISTANCE STG Manage Bladder With Equipment With total Assistance    Outcome: Not Progressing In and out cath q 4-6 hours; large volumes

## 2017-06-15 NOTE — Progress Notes (Signed)
Physical Therapy Session Note  Patient Details  Name: [name] MRN: [id number] Date of Birth: 01/25/1966  Today's Date: 06/15/2017 PT Individual Time: 1611-1715 PT Individual Time Calculation (min): 64 min   Short Term Goals: Week 4:  PT Short Term Goal 1 (Week 4): Pt will demonstrate rolling modA +2 R/L PT Short Term Goal 2 (Week 4): Pt will demonstrate dynamic sitting balance minA x10 min PT Short Term Goal 3 (Week 4): Pt will demonstrate supine>sit with modA  Skilled Therapeutic Interventions/Progress Updates:   Pt in middle of bed to chair transfer upon arrival in care of nursing staff. Took over for 1 helper and completed transfer via slide board w/ 2 helpers. Verbal cues for technique and Max A to reposition in chair comfortably and safely. Worked on strengthening and muscle activation this session while performing arm bike for 5 min x2 and arm pulleys w/ emphasis on abduction and adduction motions while passing objects between hands. Verbal and tactile cues for muscle activation and direction of movement. Frequent rest 2/2 fatigue. Returned to room and repositioned in w/c, spent 15-20 minutes adjusting leg rests as L elevating leg rest would not hold leg up. Repositioned LEs in appropriate alignment and pt ended session in w/c, call bell in L hand grip, and all needs met.   Therapy Documentation Precautions:  Precautions Precautions: Fall, Cervical Required Braces or Orthoses: Cervical Brace Cervical Brace: Hard collar, At all times Restrictions Weight Bearing Restrictions: Yes RLE Weight Bearing: Non weight bearing LLE Weight Bearing: Non weight bearing Vital Signs: Therapy Vitals Temp: 98 F (36.7 C) Temp Source: Oral Pulse Rate: (!) 107 Resp: 18 BP: 133/77 Patient Position (if appropriate): Lying Oxygen Therapy SpO2: 97 % O2 Device: Not Delivered Pain: Pain Assessment Pain Assessment: 0-10 Pain Score: 0-No pain Pain Type: Acute pain Pain Location:  Head Pain Descriptors / Indicators: Aching Pain Frequency: Intermittent Pain Onset: On-going Patients Stated Pain Goal: 2 Pain Intervention(s): Medication (See eMAR)  See Function Navigator for Current Functional Status.   Therapy/Group: Individual Therapy   K Arnette 06/15/2017, 5:42 PM  

## 2017-06-15 NOTE — Plan of Care (Signed)
Problem: SCI BOWEL ELIMINATION Goal: RH STG MANAGE BOWEL WITH ASSISTANCE STG Manage Bowel with total Assistance.    Outcome: Progressing Patient requested suppository and had BM today

## 2017-06-15 NOTE — Progress Notes (Signed)
Patient ID: [name], male   DOB: 1965/12/21, 51 y.o.   MRN: [id number]   06/15/2017.  [name] is a 51 y.o. male who is admitted for CIR with functional, cognitive and mobility deficitssecondary to Moderate TBI, C4 spinal cord injury  Past Medical History:  Diagnosis Date  . Chronic back pain   . History of suicidal ideation 01/2017   with overdose/ [facility] admission  . Lumbago with sciatica    left side  . Vitamin D deficiency       Subjective:  Complaints today include abdominal pain.  Denies any nausea or vomiting.  States that he is having bowel movements daily.  He is also bothered by frequent need for I and O catheterization  Objective: Vital signs in last 24 hours: Temp:  [98 F (36.7 C)-98.4 F (36.9 C)] 98 F (36.7 C) (10/06 0335) Pulse Rate:  [85-88] 85 (10/06 0335) Resp:  [18] 18 (10/06 0335) BP: (124-127)/(63-70) 124/70 (10/06 0335) SpO2:  [97 %-99 %] 99 % (10/06 0335) Weight:  [205 lb (93 kg)] 205 lb (93 kg) (10/06 0335) Weight change: -11.7 oz (-0.331 kg) Last BM Date: 06/14/17  Intake/Output from previous day: 10/05 0701 - 10/06 0700 In: 564 [P.O.:564] Out: 2350 [Urine:2350] Last cbgs: CBG (last 3)   Recent Labs  06/13/17 0801 06/13/17 1213 06/13/17 1621  GLUCAP 95 91 89   BP Readings from Last 3 Encounters:  06/15/17 124/70  05/21/17 124/82  01/12/17 115/79    Physical Exam General: No apparent distress   HEENT: Slight left ptosis; cervical collar in place; status post tracheostomy Lungs: Normal effort. Lungs clear to auscultation, no crackles or wheezes. Cardiovascular: Regular rate and rhythm, no edema Abdomen: Status post PEG; mild diffuse tenderness.  Bowel sounds active Musculoskeletal:  unchanged Neurological: No new neurological deficits Wounds:  Left lower extremity wrapped; soft cast involving the right lower extremity Skin: clear  Mental state: Alert, oriented, cooperative    Lab Results: BMET     Component Value Date/Time   NA 132 (L) 06/03/2017 0641   NA 138 11/02/2015 0931   K 3.6 06/03/2017 0641   CL 96 (L) 06/03/2017 0641   CO2 29 06/03/2017 0641   GLUCOSE 168 (H) 06/03/2017 0641   BUN 15 06/03/2017 0641   BUN 13 11/02/2015 0931   CREATININE 0.54 (L) 06/03/2017 0641   CALCIUM 9.1 06/03/2017 0641   GFRNONAA >60 06/03/2017 0641   GFRAA >60 06/03/2017 0641   CBC    Component Value Date/Time   WBC 4.6 06/03/2017 0641   RBC 3.70 (L) 06/03/2017 0641   HGB 9.8 (L) 06/03/2017 0641   HGB 14.6 11/02/2015 0931   HCT 31.2 (L) 06/03/2017 0641   HCT 44.1 11/02/2015 0931   PLT 218 06/03/2017 0641   PLT 243 11/02/2015 0931   MCV 84.3 06/03/2017 0641   MCV 85 11/02/2015 0931   MCH 26.5 06/03/2017 0641   MCHC 31.4 06/03/2017 0641   RDW 15.3 06/03/2017 0641   RDW 14.9 11/02/2015 0931   LYMPHSABS 2.7 05/22/2017 0452   LYMPHSABS 2.7 11/02/2015 0931   MONOABS 0.8 05/22/2017 0452   EOSABS 0.2 05/22/2017 0452   EOSABS 0.2 11/02/2015 0931   BASOSABS 0.0 05/22/2017 0452   BASOSABS 0.0 11/02/2015 0931    Studies/Results: No results found.  Medications: I have reviewed the patient's current medications.  Assessment/Plan:  Functional, cognitive and mobility deficitssecondary to Moderate TBI, C4 spinal cord injury -CIR PT, OT, speech Pain management/neck pain.  Continue fentanyl patch and when necessary oxycodone Abdominal pain.  This was more prominent earlier in the morning, and patient was rechecked one hour later.  Now he complains primarily of bloating.  Pain improves following in and out catheterization.  He feels he might improve with a BM.  We'll continue present diet at this time unless worsening  Status post left femur fracture/polytrauma Urinary retention.  Continue I&O caths    Length of stay, days: [place] , MD 06/15/2017, 9:29 AM

## 2017-06-16 ENCOUNTER — Inpatient Hospital Stay (HOSPITAL_COMMUNITY): Payer: Self-pay | Admitting: Occupational Therapy

## 2017-06-16 ENCOUNTER — Inpatient Hospital Stay (HOSPITAL_COMMUNITY): Payer: BC Managed Care – PPO

## 2017-06-16 MED ORDER — CHLORHEXIDINE GLUCONATE 0.12 % MT SOLN
15.0000 mL | Freq: Two times a day (BID) | OROMUCOSAL | Status: DC
  Administered 2017-06-16 – 2017-07-19 (×54): 15 mL via OROMUCOSAL
  Filled 2017-06-16 (×57): qty 15

## 2017-06-16 NOTE — Progress Notes (Signed)
Occupational Therapy Session Note  Patient Details  Name: [name] MRN: [id number] Date of Birth: Apr 07, 1966  Today's Date: 06/16/2017 OT Individual Time: 1000-1045 OT Individual Time Calculation (min): 45 min  and Today's Date: 06/16/2017 OT Missed Time: 15 Minutes Missed Time Reason: Patient fatigue;Pain   Short Term Goals: Week 4:  OT Short Term Goal 1 (Week 4): Pt will maintain a functional grasp with max Aduring 1 grooming task for 1 minute OT Short Term Goal 2 (Week 4): Pt will perform slide board transfer to drop arm BSC with max A OT Short Term Goal 3 (Week 4): Pt will roll to R/L in bed with mod A to facilitate LB dressing tasks at bed level  Skilled Therapeutic Interventions/Progress Updates:    Pt seen for OT session focusing on ADL re-training, functional mobililty, and caregiver training. Pt in supine upon arrival with wife present assisting with bathing task.  Therapist facilitated remainder of dressing session by having pt work on directing care of caregiver for bathing/dressing routine as well as for bed mobility and transfer to EOB. Pt required min-mod cuing for effectiveness of directing care as pt's wife not understanding his directions (i.e. What to do when he just directs "lift my leg". Pt's wife provided hands on assist throughout session, however, multiple times during session stated she would not be able to provide this level of care at home. Pt rolled with max- total A +2 in order to don brief and pants. He transferred to EOB with total A +2. He was able to maintain static sitting balance EOB with close supervision, however, requiring mod A sitting balance for dynamic task when wife assisted with donning shirt while therapist assisted with balance. Longest trial of supervision sitting ~45 seconds with VCs for anterior weight shift. Pt desiring to return to supine and wait for transport as pt had planned x-ray this morning. Total A +2 for return to supine and pt  directing caregivers in assisting for bed mobility for comfort.  Discussed at length d/c planning with pt's wife. Educated regarding limited services provided if pt were to d/c home and benefits of cont therapy at next venue of care, DME, and pt goals. Pt's wife appearing overwhelmed, however, stated that therapist had not told her anything different than what CSW had already provided.   Therapy Documentation Precautions:  Precautions Precautions: Fall, Cervical Required Braces or Orthoses: Cervical Brace Cervical Brace: Hard collar, At all times Restrictions Weight Bearing Restrictions: Yes RLE Weight Bearing: Non weight bearing LLE Weight Bearing: Non weight bearing Pain: Pain Assessment Pain Assessment: 0-10 Pain Score: 9 (neck) Pain Intervention(s): Reports being pre-medicated, repositioned, rest provided   See Function Navigator for Current Functional Status.   Therapy/Group: Individual Therapy  Lewis, Niara Bunker C 06/16/2017, 6:52 AM

## 2017-06-16 NOTE — Progress Notes (Signed)
Patient ID: [name], male   DOB: [birth year], 51 y.o.   MRN: [id number]   [name] is a 51 y.o. male who is admitted for CIR with functional and cognitive and mobility deficits secondary to TBI and spinal cord injury  Past Medical History:  Diagnosis Date  . Chronic back pain   . History of suicidal ideation 01/2017   with overdose/ [facility] admission  . Lumbago with sciatica    left side  . Vitamin D deficiency       Subjective: Patient continues to have abdominal discomfort.  This appears to be more in the lower abdominal area.  He describes some nausea that is associated with change in position but no vomiting.  He states he did have a normal bowel movement yesterday.  Requires I&O  catheterizations.  Status post PEG  Objective: Vital signs in last 24 hours: Temp:  [98 F (36.7 C)] 98 F (36.7 C) (10/07 0505) Pulse Rate:  [90-107] 90 (10/07 0505) Resp:  [18] 18 (10/07 0505) BP: (122-133)/(65-77) 122/65 (10/07 0505) SpO2:  [97 %] 97 % (10/06 1500) Weight:  [200 lb 5 oz (90.9 kg)] 200 lb 5 oz (90.9 kg) (10/07 0505) Weight change: -4 lb 11 oz (-2.127 kg) Last BM Date: 06/15/17  Intake/Output from previous day: 10/06 0701 - 10/07 0700 In: 666 [P.O.:666] Out: 2555 [Urine:2555] Last cbgs: CBG (last 3)   Recent Labs  06/13/17 1213 06/13/17 1621  GLUCAP 91 89     Physical Exam General: No apparent distress   HEENT: Cervical collar in place.  Status post tracheostomy Lungs: Normal effort. Lungs clear to auscultation, no crackles or wheezes. Cardiovascular: Regular rate and rhythm, no edema.  No tachycardia Abdomen: S/NT/ND; BS(+).  Status post PEG, suggestion of some mild tenderness in the lower abdominal regions.  Bowel sounds present Musculoskeletal:  unchanged Neurological: No new neurological deficits Wounds: Both lower extremities wrapped.  Soft cast involving the right lower extremity  Skin: clear  Mental state: Alert, oriented,  cooperative    Lab Results: BMET    Component Value Date/Time   NA 132 (L) 06/03/2017 0641   NA 138 11/02/2015 0931   K 3.6 06/03/2017 0641   CL 96 (L) 06/03/2017 0641   CO2 29 06/03/2017 0641   GLUCOSE 168 (H) 06/03/2017 0641   BUN 15 06/03/2017 0641   BUN 13 11/02/2015 0931   CREATININE 0.54 (L) 06/03/2017 0641   CALCIUM 9.1 06/03/2017 0641   GFRNONAA >60 06/03/2017 0641   GFRAA >60 06/03/2017 0641   CBC    Component Value Date/Time   WBC 4.6 06/03/2017 0641   RBC 3.70 (L) 06/03/2017 0641   HGB 9.8 (L) 06/03/2017 0641   HGB 14.6 11/02/2015 0931   HCT 31.2 (L) 06/03/2017 0641   HCT 44.1 11/02/2015 0931   PLT 218 06/03/2017 0641   PLT 243 11/02/2015 0931   MCV 84.3 06/03/2017 0641   MCV 85 11/02/2015 0931   MCH 26.5 06/03/2017 0641   MCHC 31.4 06/03/2017 0641   RDW 15.3 06/03/2017 0641   RDW 14.9 11/02/2015 0931   LYMPHSABS 2.7 05/22/2017 0452   LYMPHSABS 2.7 11/02/2015 0931   MONOABS 0.8 05/22/2017 0452   EOSABS 0.2 05/22/2017 0452   EOSABS 0.2 11/02/2015 0931   BASOSABS 0.0 05/22/2017 0452   BASOSABS 0.0 11/02/2015 0931    Studies/Results: No results found.  Medications: I have reviewed the patient's current medications.  Assessment/Plan:  Functional and mobility deficits secondary to TBI and  spinal cord injury.  Continue CIR with PT, OT and ST Abdominal pain.  Will check a KUB.  Seems be tolerating tube feedings Polytrauma with left femur fracture Urinary retention.  Continue I& O catheterizations  Review KUB Check lab a.m.    Length of stay, days: [place] , MD 06/16/2017, 9:30 AM

## 2017-06-17 ENCOUNTER — Inpatient Hospital Stay (HOSPITAL_COMMUNITY): Payer: Self-pay | Admitting: Physical Therapy

## 2017-06-17 ENCOUNTER — Inpatient Hospital Stay (HOSPITAL_COMMUNITY): Payer: BC Managed Care – PPO

## 2017-06-17 ENCOUNTER — Encounter (HOSPITAL_COMMUNITY): Payer: Self-pay | Admitting: Radiology

## 2017-06-17 ENCOUNTER — Inpatient Hospital Stay (HOSPITAL_COMMUNITY): Payer: Self-pay

## 2017-06-17 ENCOUNTER — Inpatient Hospital Stay (HOSPITAL_COMMUNITY): Payer: Self-pay | Admitting: Speech Pathology

## 2017-06-17 LAB — BASIC METABOLIC PANEL
ANION GAP: 8 (ref 5–15)
BUN: 12 mg/dL (ref 6–20)
CALCIUM: 9.9 mg/dL (ref 8.9–10.3)
CO2: 31 mmol/L (ref 22–32)
Chloride: 95 mmol/L — ABNORMAL LOW (ref 101–111)
Creatinine, Ser: 0.43 mg/dL — ABNORMAL LOW (ref 0.61–1.24)
Glucose, Bld: 107 mg/dL — ABNORMAL HIGH (ref 65–99)
Potassium: 3.8 mmol/L (ref 3.5–5.1)
Sodium: 134 mmol/L — ABNORMAL LOW (ref 135–145)

## 2017-06-17 LAB — CBC
HCT: 32.2 % — ABNORMAL LOW (ref 39.0–52.0)
HEMOGLOBIN: 10 g/dL — AB (ref 13.0–17.0)
MCH: 26.3 pg (ref 26.0–34.0)
MCHC: 31.1 g/dL (ref 30.0–36.0)
MCV: 84.7 fL (ref 78.0–100.0)
Platelets: 328 10*3/uL (ref 150–400)
RBC: 3.8 MIL/uL — AB (ref 4.22–5.81)
RDW: 16 % — ABNORMAL HIGH (ref 11.5–15.5)
WBC: 8.7 10*3/uL (ref 4.0–10.5)

## 2017-06-17 LAB — URINALYSIS, ROUTINE W REFLEX MICROSCOPIC
Bilirubin Urine: NEGATIVE
GLUCOSE, UA: NEGATIVE mg/dL
HGB URINE DIPSTICK: NEGATIVE
Ketones, ur: NEGATIVE mg/dL
Leukocytes, UA: NEGATIVE
Nitrite: NEGATIVE
Protein, ur: NEGATIVE mg/dL
SPECIFIC GRAVITY, URINE: 1.011 (ref 1.005–1.030)
pH: 7 (ref 5.0–8.0)

## 2017-06-17 MED ORDER — CLONAZEPAM 0.5 MG PO TBDP
0.5000 mg | ORAL_TABLET | Freq: Every day | ORAL | Status: DC
  Administered 2017-06-17 – 2017-07-18 (×32): 0.5 mg via ORAL
  Filled 2017-06-17 (×33): qty 1

## 2017-06-17 MED ORDER — ADULT MULTIVITAMIN LIQUID CH
15.0000 mL | Freq: Every day | ORAL | Status: DC
  Administered 2017-06-18 – 2017-06-20 (×3): 15 mL via ORAL
  Filled 2017-06-17 (×3): qty 15

## 2017-06-17 MED ORDER — FREE WATER: 225.0000 mL | Freq: Four times a day (QID) | Status: DC

## 2017-06-17 MED ORDER — PRO-STAT SUGAR FREE PO LIQD: 30.0000 mL | Freq: Three times a day (TID) | ORAL | Status: DC

## 2017-06-17 MED ORDER — HYDROXYZINE HCL 25 MG PO TABS: 25.0000 mg | ORAL_TABLET | Freq: Every day | ORAL | Status: DC | PRN

## 2017-06-17 MED ORDER — PROCHLORPERAZINE EDISYLATE 5 MG/ML IJ SOLN: 5.0000 mg | Freq: Four times a day (QID) | INTRAMUSCULAR | Status: DC | PRN

## 2017-06-17 MED ORDER — IOPAMIDOL (ISOVUE-300) INJECTION 61%
INTRAVENOUS | Status: AC
  Administered 2017-06-17: 10:00:00
  Filled 2017-06-17: qty 50

## 2017-06-17 MED ORDER — SIMETHICONE 40 MG/0.6ML PO SUSP
80.0000 mg | Freq: Four times a day (QID) | ORAL | Status: DC
  Administered 2017-06-17 – 2017-06-19 (×6): 80 mg via ORAL
  Filled 2017-06-17 (×10): qty 1.2

## 2017-06-17 MED ORDER — PROCHLORPERAZINE 25 MG RE SUPP
12.5000 mg | Freq: Four times a day (QID) | RECTAL | Status: DC | PRN
  Filled 2017-06-17: qty 1

## 2017-06-17 MED ORDER — IOPAMIDOL (ISOVUE-300) INJECTION 61%
INTRAVENOUS | Status: AC
  Filled 2017-06-17: qty 100

## 2017-06-17 MED ORDER — JEVITY 1.5 CAL/FIBER PO LIQD
1000.0000 mL | ORAL | Status: DC
  Filled 2017-06-17: qty 1000

## 2017-06-17 MED ORDER — IOPAMIDOL (ISOVUE-300) INJECTION 61%
50.0000 mL | Freq: Once | INTRAVENOUS | Status: AC | PRN
  Administered 2017-06-17: 50 mL via INTRAVENOUS

## 2017-06-17 MED ORDER — METHOCARBAMOL 500 MG PO TABS
500.0000 mg | ORAL_TABLET | Freq: Four times a day (QID) | ORAL | Status: DC
  Administered 2017-06-17 – 2017-07-19 (×126): 500 mg via ORAL
  Filled 2017-06-17 (×127): qty 1

## 2017-06-17 MED ORDER — TRAMADOL HCL 50 MG PO TABS
50.0000 mg | ORAL_TABLET | Freq: Four times a day (QID) | ORAL | Status: DC
  Administered 2017-06-17 – 2017-07-19 (×126): 50 mg via ORAL
  Filled 2017-06-17 (×128): qty 1

## 2017-06-17 MED ORDER — QUETIAPINE FUMARATE 50 MG PO TABS
50.0000 mg | ORAL_TABLET | Freq: Two times a day (BID) | ORAL | Status: DC
  Administered 2017-06-17 – 2017-07-19 (×63): 50 mg via ORAL
  Filled 2017-06-17 (×64): qty 1

## 2017-06-17 MED ORDER — PANTOPRAZOLE SODIUM 40 MG PO PACK
40.0000 mg | PACK | Freq: Every day | ORAL | Status: DC
  Administered 2017-06-19 – 2017-06-20 (×2): 40 mg via ORAL
  Filled 2017-06-17 (×3): qty 20

## 2017-06-17 MED ORDER — VENLAFAXINE HCL 75 MG PO TABS
75.0000 mg | ORAL_TABLET | Freq: Two times a day (BID) | ORAL | Status: DC
  Administered 2017-06-17 – 2017-07-19 (×63): 75 mg via ORAL
  Filled 2017-06-17 (×64): qty 1

## 2017-06-17 MED ORDER — GUAIFENESIN-DM 100-10 MG/5ML PO SYRP: 5.0000 mL | ORAL_SOLUTION | Freq: Four times a day (QID) | ORAL | Status: DC | PRN

## 2017-06-17 MED ORDER — GABAPENTIN 400 MG PO CAPS
400.0000 mg | ORAL_CAPSULE | Freq: Three times a day (TID) | ORAL | Status: DC
Start: 2017-06-17 — End: 2017-07-19
  Administered 2017-06-17 – 2017-07-19 (×95): 400 mg via ORAL
  Filled 2017-06-17 (×97): qty 1

## 2017-06-17 MED ORDER — IOPAMIDOL (ISOVUE-300) INJECTION 61%: 15.0000 mL | INTRAVENOUS | Status: AC

## 2017-06-17 MED ORDER — PROCHLORPERAZINE MALEATE 5 MG PO TABS: 5.0000 mg | ORAL_TABLET | Freq: Four times a day (QID) | ORAL | Status: DC | PRN

## 2017-06-17 MED ORDER — POLYETHYLENE GLYCOL 3350 17 G PO PACK
17.0000 g | PACK | Freq: Once | ORAL | Status: AC
  Administered 2017-06-17: 17 g via ORAL
  Filled 2017-06-17: qty 1

## 2017-06-17 MED ORDER — OXYCODONE HCL 5 MG/5ML PO SOLN: 5.0000 mg | Freq: Four times a day (QID) | ORAL | Status: DC | PRN

## 2017-06-17 NOTE — Progress Notes (Signed)
Occupational Therapy Session Note  Patient Details  Name: [name] MRN: [id number] Date of Birth: Feb 14, 1966  Today's Date: 06/17/2017 OT Individual Time: 0700-0800 OT Individual Time Calculation (min): 60 min    Short Term Goals: Week 4:  OT Short Term Goal 1 (Week 4): Pt will maintain a functional grasp with max Aduring 1 grooming task for 1 minute OT Short Term Goal 2 (Week 4): Pt will perform slide board transfer to drop arm BSC with max A OT Short Term Goal 3 (Week 4): Pt will roll to R/L in bed with mod A to facilitate LB dressing tasks at bed level  Skilled Therapeutic Interventions/Progress Updates:   Focus on bed mobility, sitting balance, functional transfers, directing care, BUE AAROM, and safety awareness to increase independence with BADLs.  Pt initiates rolling in bed but requires max A to complete task.  Pt attempted to pull pants over hips but unable to grasp pants sufficiently to complete task.  Pt able to maintain static sitting balance for ~30 seconds at EOB in preparation for slide board transfer.  Pt requires max A for lateral lean to facilitate slide board placement.  Slide board transfer with max A.  Pt able to wash face with support provided at L elbow.  Pt able to straighten BUE when placed in shirt sleeves.  Pt remained in w/c with soft call bell in grasp.    Therapy Documentation Precautions:  Precautions Precautions: Fall, Cervical Required Braces or Orthoses: Cervical Brace Cervical Brace: Hard collar, At all times Restrictions Weight Bearing Restrictions: Yes RLE Weight Bearing: Non weight bearing LLE Weight Bearing: Non weight bearing Pain: Pt c/o abdominal pain and could not tolerate supine position in bed; RN and MD aware   See Function Navigator for Current Functional Status.   Therapy/Group: Individual Therapy  Leroy Libman 06/17/2017, 12:11 PM

## 2017-06-17 NOTE — Progress Notes (Signed)
Occupational Therapy Note  Patient Details  Name: [name] MRN: [id number] Date of Birth: 1966-05-22  Today's Date: 06/17/2017 OT Individual Time: 1300-1330 OT Individual Time Calculation (min): 30 min  and Today's Date: 06/17/2017 OT Missed Time: 30 Minutes Missed Time Reason: Patient fatigue;Pain  Pt c/o ongoing abdominal pain, RN aware and repositioned Individual Therapy  Pt asleep in bed upon arrival.  Pt demonstrated difficulty keeping eyes open and responding to therapist.  Attempted to reposition pt in bed for bed mobility activities but pt c/o increased abdominal pain with movement.  Attempted BUE AAROM but pt continued to demonstrate difficulty responding to commands secondary to lethargy.  Pt missed 30 mins skilled OT services 2/2 lethargy and pain.   [name] [facility] 06/17/2017, 1:39 PM

## 2017-06-17 NOTE — Progress Notes (Signed)
SSE administered per order with good results of large bm and water return. Pt tolerated procedure well. I+O cath for urinary retention. URine specimen obtained and sent to lab for UA/UC per order. Pt continues to report abd discomfort esp with movement  [name]

## 2017-06-17 NOTE — Progress Notes (Signed)
Speech Language Pathology Daily Session Note  Patient Details  Name: Rowland Ericsson MRN: [id number] Date of Birth: 09-May-1966  Today's Date: 06/17/2017 SLP Individual Time: 1005-1035 SLP Individual Time Calculation (min): 30 min  Short Term Goals: Week 4: SLP Short Term Goal 1 (Week 4): Pt will identify at least 2 new deficits and their impact on his functional independence with Min assist verbal cues.   SLP Short Term Goal 2 (Week 4): Pt will verbalize at least 2 safety precautions with min assist verbal cues.   SLP Short Term Goal 3 (Week 4): Pt will direct self care by answering questions regarding PO intake (i.e., amount of intake, cup vs spoon) with Min A cues.  SLP Short Term Goal 4 (Week 4): Pt will consume full liquid diet with Min overt s/s of aspiration and supervision cues for use of compensatory strategies.   Skilled Therapeutic Interventions:  Pt was seen for skilled ST targeting dysphagia goals.  Pt had just returned from procedure and had also recently received a muscle relaxer prior to therapist's arrival.  As a result, pt was lethargic due to medication effects throughout therapy session.  Pt consumed thin liquids via straw and purees allowed on full liquids diet via spoon with no overt s/s of aspiration and supervision cues for use of swallowing precautions.  Pt had limited PO intake due to lethargy and needed increased cues for alertness; as a result, session was ended early.  RN made aware.  Pt left in bed with soft touch call bell within reach.  Continue per current plan of care.    Function:  Eating Eating   Modified Consistency Diet: Yes Eating Assist Level: Helper feeds patient           Cognition Comprehension Comprehension assist level: Follows complex conversation/direction with extra time/assistive device  Expression   Expression assist level: Expresses complex ideas: With extra time/assistive device  Social Interaction Social Interaction assist  level: Interacts appropriately 90% of the time - Needs monitoring or encouragement for participation or interaction.  Problem Solving Problem solving assist level: Solves basic 90% of the time/requires cueing < 10% of the time  Memory Memory assist level: Recognizes or recalls 90% of the time/requires cueing < 10% of the time    Pain Pain Assessment Pain Assessment: No/denies pain  Therapy/Group: Individual Therapy  Reiner Loewen, Selinda Orion 06/17/2017, 10:42 AM

## 2017-06-17 NOTE — Progress Notes (Signed)
[facility]YSICAL [facility]ICINE [facility] REHABILITATION     PROGRESS NOTE    Subjective/Complaints: Up in w/c. Having stomach discomfort, particularly around PEG. RN reports discomfort with TF.   ROS: pt denies nausea, vomiting, diarrhea, cough, shortness of breath or chest pain   Objective: Vital Signs: Blood pressure 129/73, pulse 86, temperature 98.4 F (36.9 C), temperature source Oral, resp. rate 18, height 5\' 6"  (1.676 m), weight 84.8 kg (187 lb), SpO2 97 %. Dg Abd 1 View  Result Date: 06/16/2017 CLINICAL DATA:  51 year old male with abdominal discomfort, most severe in the lower abdomen. Some associated. EXAM: ABDOMEN - 1 VIEW COMPARISON:  No priors. FINDINGS: Gas and stool are seen scattered throughout the colon extending to the level of the distal rectum. No pathologic distension of small bowel is noted. Several nondilated loops of gas-filled small bowel are noted. Percutaneous gastrostomy tube seen projecting over the left upper quadrant. No gross evidence of pneumoperitoneum. Orthopedic fixation hardware in the left proximal femur incompletely visualized. IMPRESSION: 1. Nonobstructive bowel gas pattern. 2. No pneumoperitoneum. Electronically Signed   By: Vinnie Langton M.D.   On: 06/16/2017 13:21   Dg Abdomen Peg Tube Location  Result Date: 06/17/2017 CLINICAL DATA:  Evaluate gastrostomy tube placement EXAM: ABDOMEN - 1 VIEW COMPARISON:  06/16/2017 FINDINGS: Left upper quadrant gastrostomy tube was injected with 50 cc of Isovue-300. This resulted in opacification of the stomach confirming intraluminal location of the gastrostomy tube. IMPRESSION: 1. Gastrostomy tube appears intraluminal with opacification of the stomach. Electronically Signed   By: Kerby Moors M.D.   On: 06/17/2017 10:02    Recent Labs  06/17/17 0547  WBC 8.7  HGB 10.0*  HCT 32.2*  PLT 328    Recent Labs  06/17/17 0547  NA 134*  K 3.8  CL 95*  GLUCOSE 107*  BUN 12  CREATININE 0.43*  CALCIUM 9.9   CBG  (last 3)  No results for input(s): GLUCAP in the last 72 hours.  Wt Readings from Last 3 Encounters:  06/17/17 84.8 kg (187 lb)  05/21/17 99 kg (218 lb 3.2 oz)  01/11/17 91.6 kg (202 lb)    Physical Exam:  Constitutional: He appears well-developedand well-nourished. He is in no distress.  HENT:  Head: Normocephalicand atraumatic.   Eyes:  Unable to move right eye laterally. Left eye limited to lateral field and medial fields--- scleral irritation better. Neck:  Phonation good  Immobilized by Aspen collar. Trach site closed Cardiovascular:RRR without murmur. No JVD          Respiratory: CTA Bilaterally without wheezes or rales. Normal effort     GI: abdomen distended. Discomfort around PEG, mild drainage Genitourinary:  Genitourinary   Musculoskeletal: He exhibits continued    left thigh/leg with ACE Neurological:    Motor 1-2/5 UE especially with wrist, biceps, early tricep movement. Minimal shoulders Trace to absent movement left ankle.   Senses pain stim in all 4. Motor-sensory stable.  Left III, VI nerve weakness but seems to have some movement. Right VI deficit--persistent Skin: Skin is warmand dry.  Wounds all dry/healing. Sutures intact LLE Psychiatric: pleasant   Assessment/Plan: 1. Functional deficits, tetraplegia secondary to TBI/cervical SCI which require 3+ hours per day of interdisciplinary therapy in a comprehensive inpatient rehab setting. Physiatrist is providing close team supervision and 24 hour management of active medical problems listed below. Physiatrist and rehab team continue to assess barriers to discharge/monitor patient progress toward functional and medical goals.  Function:  Bathing Bathing position  Position: Bed  Bathing parts   Body parts bathed by helper: Right arm, Left arm, Chest, Abdomen, Front perineal area, Buttocks, Right upper leg, Left upper leg, Right lower leg, Left lower leg, Back  Bathing assist Assist Level:  (total  assist)      Upper Body Dressing/Undressing Upper body dressing   What is the patient wearing?: Button up shirt           Button up shirt - Perfomed by helper: Thread/unthread right sleeve, Thread/unthread left sleeve, Pull shirt around back, Button/unbutton shirt    Upper body assist Assist Level: 2 helpers (Sitting EOB)      Lower Body Dressing/Undressing Lower body dressing   What is the patient wearing?: Pants       Pants- Performed by helper: Thread/unthread right pants leg, Thread/unthread left pants leg, Pull pants up/down                      Lower body assist Assist for lower body dressing: 2 Helpers      Toileting Toileting Toileting activity did not occur: No continent bowel/bladder event   Toileting steps completed by helper: Adjust clothing prior to toileting, Performs perineal hygiene, Adjust clothing after toileting    Toileting assist Assist level: Two helpers   Transfers Chair/bed transfer   Chair/bed transfer method: Lateral scoot Chair/bed transfer assist level: 2 helpers Chair/bed transfer assistive device: Sliding board Mechanical lift: Maximove   Locomotion Ambulation Ambulation activity did not occur: Safety/medical Editor, commissioning activity did not occur: Safety/medical concerns Type: Manual Max wheelchair distance: 300+ ft Assist Level: Dependent (Pt equals 0%)  Cognition Comprehension Comprehension assist level: Understands basic 90% of the time/cues < 10% of the time  Expression Expression assist level: Expresses basic 90% of the time/requires cueing < 10% of the time.  Social Interaction Social Interaction assist level: Interacts appropriately 75 - 89% of the time - Needs redirection for appropriate language or to initiate interaction.  Problem Solving Problem solving assist level: Solves basic 90% of the time/requires cueing < 10% of the time  Memory Memory assist level: Recognizes or recalls 90% of  the time/requires cueing < 10% of the time   Medical Problem List and Plan: 1. Functional, cognitive and mobility deficitssecondary to Moderate TBI, C4 spinal cord injury -CIR PT, OT, speech  -ortho may advance weight bearing RLE next week (foot fractures) 2. DVT Prophylaxis/Anticoagulation: Pharmaceutical: Lovenox 3. Chronic neck pain/Pain Management: On Gabapentin bid   -changed hydrocodone to oxycodone for breakthrough pain  - fentanyl patch  31mcg/hr    -Headaches are primarly related to occipital-cervical fractures 4. Mood: LCSW to follow for evaluation and support when appropriate.  5. Neuropsych: This patient is notcapable of making decisions on hisown behalf.  -continue deficits in memory/insight 6. Skin/Wound Care: Air mattress overlay. Maintain adequate nutritional and hydration status.  7. Fluids/Electrolytes/Nutrition: Monitor I/O.   -will hold TF given pain   -continue full liquid diet  9. Left periorbital cellulitis/ CN III/VI palsy:  -continue local care  -follow up per optho 10Left femur fracture s/p IM nail: NWB X 8 weeks.has left foot drop 11. Left open tibial fracture s/p I and D with ORIF: NWB--CAM boot.  11. Left mandibular fracture s/p CR /tongue laceration:   -wires removed, screws in place for two more weeks from 9/26. No chewing   -need to protect wires from scraping gums  -trach out and tolerating well. 12.  Chemosis left eye: Continue Lacrilube every 3 hours till resolved per opthalmology.  -eye gradually improving. optho recommends ongoing consvt mgt 13. Facial cellulitis: keflex was completed  . 15. Thrombocytosis: Likely reactive. Dopplers pending.   16. ABLA: hgb stable at 10.5 17. Abnormal LFTs: Question due to shocked liver.  70. H/o of MDD: Was on Trintellix 5 mg and Risperidone 5 mg bid at home--continue to hold for now.   -behavior reasonable at present 19. Urine retention/neurogenic bladder: foley out  -continue voiding  trial with I/O caths 20. ID/septicemia:  -Left lower lobe infiltrate on CXR, wbc's 16k on 9/22---down to 4.6 9/24  -afebrile   -ucx with 100k E Coli  -converted to bactrim---completed  -blood cultures negative .  21. Abdominal/PEG site pain: may be due to gast/retained stool/distention  -KUB personally reviewed  -attempt to decompress abdomen  -enema today, SSE if needed  -simethicone.   -hold TF        LOS (Days) 27 A FACE TO FACE EVALUATION WAS PERFORMED  Alger Simons T, MD 06/17/2017 10:20 AM

## 2017-06-17 NOTE — Progress Notes (Signed)
Patient with tenderness around PEG site and LLQ to palpation. Mild distension with decreased BS. No erythema or drainage around PEG site. KUB with dye showed that PEG is in gastric lumen. Simethicone added to help with gas/bloating. Will increase miralax to bid today and follow with enema to help with bowel gas/stool. Labs today look good. Change all meds to oral and hold tube feeds today. Discontinue fiber.

## 2017-06-17 NOTE — Progress Notes (Signed)
   Subjective:    Patient ID: [name], male    DOB: [birth year], 51 y.o.   MRN: [id number]  HPI Doing well from facial injuries.  Eager to start chewing.  Not having any mandibular pain.  Teeth seem to come together well.  Decannulated with healed stoma.  Review of Systems     Objective:   Physical Exam AF VSS Alert, NAD Bilateral lateral gaze palsy, left conjunctival edema slightly improved. No mandibular tenderness or deformity. Teeth come together well, no occlusal molars. Trach stoma scar. Hard cervical collar.    Assessment & Plan:  Tongue laceration s/p closure, left angle mandible fracture s/p MMF, left orbital cellulitis, bilateral CN VI palsy  Discussed case with rehab PA.  Mandible appears to be well-healed.  His diet can advance to allow chewing.  Stop chewing if there is pain with biting down.  Will schedule removal of screws and remaining wires under sedation.

## 2017-06-17 NOTE — Progress Notes (Signed)
Patient up in bed, alert and NAD. Abdomen with decrease in distension and has hyperactive BS, he reports abdominal pain has gotten better but he is still tender around PEG site.  Per reports that he ate about 60% at meal and patient now asking for supper. No nausea or vomiting reported.  Will order CT abdomen for work up.

## 2017-06-17 NOTE — Progress Notes (Signed)
Pt c/o abd pain when anything put into PEG. Reported pain noted at least for past two days. Faint bowel sounds, abd is taunt.no nausea or vomiting. TF on hold. Notified MD of assessment. Margarito Liner

## 2017-06-17 NOTE — Progress Notes (Signed)
Physical Therapy Session Note  Patient Details  Name: [name] MRN: [id number] Date of Birth: 04-19-1966  Today's Date: 06/17/2017 PT Individual Time: 1515-1540 and 0900-0915 PT Individual Time Calculation (min): 25 min and 15 min (total 40 min)   Short Term Goals: Week 4:  PT Short Term Goal 1 (Week 4): Pt will demonstrate rolling modA +2 R/L PT Short Term Goal 2 (Week 4): Pt will demonstrate dynamic sitting balance minA x10 min PT Short Term Goal 3 (Week 4): Pt will demonstrate supine>sit with modA  Skilled Therapeutic Interventions/Progress Updates: Tx 1: Pt received seated in w/c, c/o abdominal pain and requesting to use bedpan. Declines attempt to sit on [facility] to attempt voiding. Returned to bed with transfer board and maxA +2. Sit >supine totalA +2. Rolling totalA +2 to doff brief and place bedpan. RN present to perform I&O cath; transport arrived to take pt to test off unit. Missed 45 min PT; will continue to follow per POC as schedule allows.   Tx 2: Pt received on bedpan; required time to finish voiding and nursing staff helped pt with hygiene and clothing management. Pt requesting to work on UE strength. Performed 2 sets 10 reps eccentric/concentric tricep extensions in supine with therapist assisting with maintaining upper arm in shoulder flexion to vertical. PROM BUE shoulder IR/ER d/t reports of pain in shoulders. LUE painful in shoulder flexion to 90 degrees; therapist performed gentle traction through range with some improvement in pain. PNF D2 flexion/extension rhythmic initiation PROM>AAROM with poor activation noted throughout with exception of grip and wrist flexion into D2 flexion. Remained semi-reclined in bed at end of session, all needs in reach.      Therapy Documentation Precautions:  Precautions Precautions: Fall, Cervical Required Braces or Orthoses: Cervical Brace Cervical Brace: Hard collar, At all times Restrictions Weight Bearing Restrictions:  Yes RLE Weight Bearing: Non weight bearing LLE Weight Bearing: Non weight bearing General: PT Amount of Missed Time (min): 45 Minutes PT Missed Treatment Reason: Unavailable (Comment) Vital Signs: Therapy Vitals Temp: 98.4 F (36.9 C) Temp Source: Oral Pulse Rate: 86 Resp: 18 BP: 129/73 Patient Position (if appropriate): Lying Oxygen Therapy SpO2: 97 % O2 Device: Not Delivered Pain:     See Function Navigator for Current Functional Status.   Therapy/Group: Individual Therapy  [name] 06/17/2017, 7:42 AM

## 2017-06-18 ENCOUNTER — Inpatient Hospital Stay (HOSPITAL_COMMUNITY): Payer: Self-pay

## 2017-06-18 ENCOUNTER — Inpatient Hospital Stay (HOSPITAL_COMMUNITY): Payer: BC Managed Care – PPO

## 2017-06-18 ENCOUNTER — Encounter (HOSPITAL_COMMUNITY): Payer: Self-pay

## 2017-06-18 ENCOUNTER — Inpatient Hospital Stay (HOSPITAL_COMMUNITY): Payer: Self-pay | Admitting: Speech Pathology

## 2017-06-18 ENCOUNTER — Inpatient Hospital Stay (HOSPITAL_COMMUNITY): Payer: Self-pay | Admitting: Physical Therapy

## 2017-06-18 ENCOUNTER — Inpatient Hospital Stay (HOSPITAL_COMMUNITY): Payer: Self-pay | Admitting: *Deleted

## 2017-06-18 DIAGNOSIS — R1012 Left upper quadrant pain: Secondary | ICD-10-CM

## 2017-06-18 LAB — CBC WITH DIFFERENTIAL/PLATELET
BASOS ABS: 0.1 10*3/uL (ref 0.0–0.1)
Basophils Relative: 1 %
EOS PCT: 3 %
Eosinophils Absolute: 0.2 10*3/uL (ref 0.0–0.7)
HEMATOCRIT: 32.6 % — AB (ref 39.0–52.0)
HEMOGLOBIN: 10.4 g/dL — AB (ref 13.0–17.0)
LYMPHS PCT: 29 %
Lymphs Abs: 1.9 10*3/uL (ref 0.7–4.0)
MCH: 26.6 pg (ref 26.0–34.0)
MCHC: 31.9 g/dL (ref 30.0–36.0)
MCV: 83.4 fL (ref 78.0–100.0)
MONOS PCT: 12 %
Monocytes Absolute: 0.8 10*3/uL (ref 0.1–1.0)
NEUTROS PCT: 55 %
Neutro Abs: 3.6 10*3/uL (ref 1.7–7.7)
Platelets: 352 10*3/uL (ref 150–400)
RBC: 3.91 MIL/uL — AB (ref 4.22–5.81)
RDW: 15.8 % — ABNORMAL HIGH (ref 11.5–15.5)
Smear Review: ADEQUATE
WBC: 6.6 10*3/uL (ref 4.0–10.5)

## 2017-06-18 LAB — URINE CULTURE: Culture: NO GROWTH

## 2017-06-18 MED ORDER — SODIUM CHLORIDE 0.45 % IV SOLN: INTRAVENOUS | Status: DC

## 2017-06-18 MED ORDER — SODIUM CHLORIDE 0.45 % IV SOLN
INTRAVENOUS | Status: DC
  Administered 2017-06-18: 09:00:00 via INTRAVENOUS

## 2017-06-18 MED ORDER — PRO-STAT SUGAR FREE PO LIQD
30.0000 mL | Freq: Two times a day (BID) | ORAL | Status: DC
  Administered 2017-06-18 – 2017-06-25 (×8): 30 mL via ORAL
  Filled 2017-06-18 (×26): qty 30

## 2017-06-18 NOTE — Progress Notes (Signed)
Speech Language Pathology Daily Session Note  Patient Details  Name: Nathin Saran MRN: [id number] Date of Birth: Mar 06, 1966  Today's Date: 06/18/2017 SLP Individual Time: 1545-1600 SLP Individual Time Calculation (min): 15 min  Short Term Goals: Week 4: SLP Short Term Goal 1 (Week 4): Pt will identify at least 2 new deficits and their impact on his functional independence with Min assist verbal cues.   SLP Short Term Goal 2 (Week 4): Pt will verbalize at least 2 safety precautions with min assist verbal cues.   SLP Short Term Goal 3 (Week 4): Pt will direct self care by answering questions regarding PO intake (i.e., amount of intake, cup vs spoon) with Min A cues.  SLP Short Term Goal 4 (Week 4): Pt will consume full liquid diet with Min overt s/s of aspiration and supervision cues for use of compensatory strategies.   Skilled Therapeutic Interventions:  Pt was seen for skilled ST targeting dysphagia goals.  Pt is now cleared for a solids diet per otolaryngology.  Pt consumed trials of dys 2 textures and thin liquids with adequate clearance of solids from the oral cavity and no overt s/s of aspiration with solids or liquids.  Pt directed his care appropriately in regards to rate and portion size as therapist was feeding pt with supervision question cues.  Would recommend ongoing trials with SLP to ensure toleration prior to advancement.  Continue per current plan of care.    Function:  Eating Eating   Modified Consistency Diet: Yes Eating Assist Level: Helper feeds patient           Cognition Comprehension Comprehension assist level: Follows complex conversation/direction with extra time/assistive device  Expression   Expression assist level: Expresses complex ideas: With extra time/assistive device  Social Interaction Social Interaction assist level: Interacts appropriately with others with medication or extra time (anti-anxiety, antidepressant).  Problem Solving Problem  solving assist level: Solves basic 90% of the time/requires cueing < 10% of the time  Memory Memory assist level: Recognizes or recalls 90% of the time/requires cueing < 10% of the time    Pain Pain Assessment Pain Assessment: No/denies pain  Therapy/Group: Individual Therapy  Deandria Klute, Selinda Orion 06/18/2017, 4:36 PM

## 2017-06-18 NOTE — Progress Notes (Signed)
Pt resting in bed quietly and easily aroused. noted to have a very distended abdomen that he "very uncomfortable" and firm to palpation upon assessment. CT scan completed this shift and at 0027 on 06/18/17 radiologist calls to report that pegtube is not in the stomach but is within the abdominal wall. T/c placed to on-call provider and informed of called report who will consult surgery this morning. . Nothing infusing via pegtube this shift per order . Pt tolerated po well this shift and includes drinking the contrast. bilat lower ext drsgs noted to cover abrasions and changed this morning. Boots applied. Wife at bedside earlier this shift. Able to make needs known. Safety maintained. callbell within reach. Will continue to monitor. Q 4 hour bladder scan with IC for urinary retention continues.

## 2017-06-18 NOTE — Progress Notes (Signed)
[facility] PHYSICAL MEDICINE & REHABILITATION     PROGRESS NOTE    Subjective/Complaints: Ongoing abdominal pain/PEG pain. Perhaps a little better than yesterday. Low grade temp. Had a bm yesterday  ROS: Limited due to cognitive/behavioral   Objective: Vital Signs: Blood pressure 108/71, pulse 72, temperature 99.1 F (37.3 C), temperature source Oral, resp. rate 20, height 5\' 6"  (1.676 m), weight 85.2 kg (187 lb 15 oz), SpO2 99 %. Dg Abd 1 View  Result Date: 06/16/2017 CLINICAL DATA:  51 year old male with abdominal discomfort, most severe in the lower abdomen. Some associated. EXAM: ABDOMEN - 1 VIEW COMPARISON:  No priors. FINDINGS: Gas and stool are seen scattered throughout the colon extending to the level of the distal rectum. No pathologic distension of small bowel is noted. Several nondilated loops of gas-filled small bowel are noted. Percutaneous gastrostomy tube seen projecting over the left upper quadrant. No gross evidence of pneumoperitoneum. Orthopedic fixation hardware in the left proximal femur incompletely visualized. IMPRESSION: 1. Nonobstructive bowel gas pattern. 2. No pneumoperitoneum. Electronically Signed   By: Vinnie Langton M.D.   On: 06/16/2017 13:21   Ct Abdomen Pelvis W Contrast  Result Date: 06/18/2017 CLINICAL DATA:  Umbilical abdominal pain around feeding tube/ PEG site, LEFT lower quadrant pain EXAM: CT ABDOMEN AND PELVIS WITH CONTRAST TECHNIQUE: Multidetector CT imaging of the abdomen and pelvis was performed using the standard protocol following bolus administration of intravenous contrast. Sagittal and coronal MPR images reconstructed from axial data set. CONTRAST:  Dilute oral contrast.  100 cc Isovue-300 IV. COMPARISON:  05/03/2017 FINDINGS: Lower chest: Subsegmental atelectasis at both lung bases Hepatobiliary: Gallbladder and liver normal appearance Pancreas: Normal appearance Spleen: Normal appearance Adrenals/Urinary Tract: Tiny nonobstructing RIGHT renal  calculus. Adrenal glands, kidneys, and ureters normal appearance. Depending high attenuation material within the bladder, could represent excreted absorbed contrast material from earlier PEG tube check or dependent calculi. Stomach/Bowel: PEG tube bumper is located within the LEFT rectus abdominus muscle and appears external to the stomach. Normal appendix. Stomach and bowel loops otherwise normal appearance. Vascular/Lymphatic: Aorta normal caliber with minimal atherosclerotic calcification. No adenopathy. Reproductive: Unremarkable prostate gland and seminal vesicles. Other: No free air or free fluid.  No hernia. Musculoskeletal: Orthopedic hardware proximal LEFT femur. IMPRESSION: Bumper from the PEG tube is located within the the abdominal wall within the LEFT rectus abdominis muscle and appears external to the stomach. Question excreted contrast material versus dependent calculi in urinary bladder. Tiny nonobstructing RIGHT renal calculus. Findings called to Marsh Dolly RN on [facility] on 06/18/2017 at 0026 hours. Electronically Signed   By: Lavonia Dana M.D.   On: 06/18/2017 00:27   Dg Abdomen Peg Tube Location  Result Date: 06/17/2017 CLINICAL DATA:  Evaluate gastrostomy tube placement EXAM: ABDOMEN - 1 VIEW COMPARISON:  06/16/2017 FINDINGS: Left upper quadrant gastrostomy tube was injected with 50 cc of Isovue-300. This resulted in opacification of the stomach confirming intraluminal location of the gastrostomy tube. IMPRESSION: 1. Gastrostomy tube appears intraluminal with opacification of the stomach. Electronically Signed   By: Kerby Moors M.D.   On: 06/17/2017 10:02    Recent Labs  06/17/17 0547 06/18/17 0521  WBC 8.7 6.6  HGB 10.0* 10.4*  HCT 32.2* 32.6*  PLT 328 352    Recent Labs  06/17/17 0547  NA 134*  K 3.8  CL 95*  GLUCOSE 107*  BUN 12  CREATININE 0.43*  CALCIUM 9.9   CBG (last 3)  No results for input(s): GLUCAP in the last 72  hours.  Wt Readings from Last 3  Encounters:  06/18/17 85.2 kg (187 lb 15 oz)  05/21/17 99 kg (218 lb 3.2 oz)  01/11/17 91.6 kg (202 lb)    Physical Exam:  Constitutional: He appears well-developedand well-nourished. He is in no distress.  HENT:  Head: Normocephalicand atraumatic.   Eyes:  Unable to move right eye laterally. Left eye limited to lateral field and medial fields--- scleral irritation improved Neck:  Phonation good  Immobilized by Aspen collar. Trach site closed Cardiovascular: RRR without murmur. No JVD           Respiratory: CTA Bilaterally without wheezes or rales. Normal effort      GI: abdomen slightly less distended. Bowels sounds present. Remains quite sensitive near PEG site. PEG itself without drainage at abdominal wall.   Musculoskeletal: He exhibits continued    left thigh/leg with dressing Neurological:    Motor 1-2/5 UE especially with wrist, biceps, early tricep movement. Minimal shoulders Trace to absent movement left ankle.   Senses pain stim in all 4. Motor-sensory unchanged.  Left III, VI nerve weakness but seems to have some movement. Right VI deficit--persistent Skin: Skin is warmand dry.  Wounds all dry/healing. Sutures intact LLE Psychiatric: pleasant   Assessment/Plan: 1. Functional deficits, tetraplegia secondary to TBI/cervical SCI which require 3+ hours per day of interdisciplinary therapy in a comprehensive inpatient rehab setting. Physiatrist is providing close team supervision and 24 hour management of active medical problems listed below. Physiatrist and rehab team continue to assess barriers to discharge/monitor patient progress toward functional and medical goals.  Function:  Bathing Bathing position   Position: Bed  Bathing parts   Body parts bathed by helper: Right arm, Left arm, Chest, Abdomen, Front perineal area, Buttocks, Right upper leg, Left upper leg, Right lower leg, Back, Left lower leg  Bathing assist Assist Level: 2 helpers      Upper Body  Dressing/Undressing Upper body dressing   What is the patient wearing?: Button up shirt           Button up shirt - Perfomed by helper: Thread/unthread right sleeve, Thread/unthread left sleeve, Pull shirt around back, Button/unbutton shirt    Upper body assist Assist Level: 2 helpers (Sitting EOB)      Lower Body Dressing/Undressing Lower body dressing   What is the patient wearing?: Pants       Pants- Performed by helper: Thread/unthread right pants leg, Thread/unthread left pants leg, Pull pants up/down                      Lower body assist Assist for lower body dressing: 2 Helpers      Toileting Toileting Toileting activity did not occur: No continent bowel/bladder event   Toileting steps completed by helper: Adjust clothing prior to toileting, Performs perineal hygiene, Adjust clothing after toileting    Toileting assist Assist level: Two helpers   Transfers Chair/bed transfer   Chair/bed transfer method: Lateral scoot Chair/bed transfer assist level: 2 helpers Chair/bed transfer assistive device: Sliding board Mechanical lift: Maximove   Locomotion Ambulation Ambulation activity did not occur: Safety/medical Editor, commissioning activity did not occur: Safety/medical concerns Type: Manual Max wheelchair distance: 300+ ft Assist Level: Dependent (Pt equals 0%)  Cognition Comprehension Comprehension assist level: Follows complex conversation/direction with extra time/assistive device  Expression Expression assist level: Expresses complex ideas: With extra time/assistive device  Social Interaction Social Interaction assist level: Interacts appropriately  with others - No medications needed.  Problem Solving Problem solving assist level: Solves basic 90% of the time/requires cueing < 10% of the time  Memory Memory assist level: Recognizes or recalls 90% of the time/requires cueing < 10% of the time   Medical Problem List and  Plan: 1. Functional, cognitive and mobility deficitssecondary to Moderate TBI, C4 spinal cord injury -CIR PT, OT, speech--hold therapies for now  -ortho may advance weight bearing RLE this week (foot fractures) 2. DVT Prophylaxis/Anticoagulation: Pharmaceutical: Lovenox 3. Chronic neck pain/Pain Management: On Gabapentin bid   -changed hydrocodone to oxycodone for breakthrough pain  - fentanyl patch  59mcg/hr    -Headaches are primarly related to occipital-cervical fractures 4. Mood: LCSW to follow for evaluation and support when appropriate.  5. Neuropsych: This patient is notcapable of making decisions on hisown behalf.  -continue deficits in memory/insight 6. Skin/Wound Care: Air mattress overlay. Maintain adequate nutritional and hydration status.  7. Fluids/Electrolytes/Nutrition: Monitor I/O.   -TF on hold   -full liquid diet(hold)  -IVF 9. Left periorbital cellulitis/ CN III/VI palsy:  -continue local care  -follow up per optho 10Left femur fracture s/p IM nail: NWB X 8 weeks.has left foot drop 11. Left open tibial fracture s/p I and D with ORIF: NWB--CAM boot.  11. Left mandibular fracture s/p CR /tongue laceration:   -wires removed, screws in place for two more weeks from 9/26. No chewing   -need to protect wires from scraping gums  -trach out and tolerating well. 12. Chemosis left eye: Continue Lacrilube every 3 hours till resolved per opthalmology.  -eye gradually improving. optho recommends ongoing consvt mgt 13. Facial cellulitis: keflex was completed  . 15. Thrombocytosis: Likely reactive.    16. ABLA: hgb stable at 10.5 17. Abnormal LFTs: Question due to shocked liver.  82. H/o of MDD: Was on Trintellix 5 mg and Risperidone 5 mg bid at home--continue to hold for now.   -behavior reasonable at present 19. Urine retention/neurogenic bladder: foley out  -continue voiding trial with I/O caths 20. ID/septicemia:  -Left lower lobe infiltrate on  CXR, wbc's 16k on 9/22---down to 4.6 9/24  -afebrile   -ucx with 100k E Coli  -converted to bactrim---completed  -blood cultures negative .  21. Abdominal/PEG site pain: CT reviewed and reveals dislodged PEG located within left rectus  -TF on hold since yesterday. NPO   -request surgical evaluation  -Did have BM yesterday  -labs stable today  -bed rest           LOS (Days) 28 [address] [place] T, MD 06/18/2017 8:53 AM

## 2017-06-18 NOTE — Progress Notes (Signed)
Physical Therapy Session Note  Patient Details  Name: [name] MRN: [id number] Date of Birth: [birth year]  Today's Date: 06/18/2017 PT Individual Time: 1020-1045 PT Individual Time Calculation (min): 25 min   Short Term Goals: Week 4:  PT Short Term Goal 1 (Week 4): Pt will demonstrate rolling modA +2 R/L PT Short Term Goal 2 (Week 4): Pt will demonstrate dynamic sitting balance minA x10 min PT Short Term Goal 3 (Week 4): Pt will demonstrate supine>sit with modA  Skilled Therapeutic Interventions/Progress Updates: Tx 1: Pt on bedrest per RN; performed bed level BLE NMR and PROM. Performed BLE 2x10 reps short arc quads, required LLE boot be removed in order to perform full AROM. 2 x10 reps hip adduction pillow squeeze. Hand-over-hand assist to retrieve cup with LUE and bring to mouth. Pt engaged in music therapy throughout session to increase compliance, assist with pain management, reduce anxiety. Remained supine in bed at end of session, all needs in reach.   Tx 2: Pt received in bed with NT performing hygiene after incontinent bowel movement. Pt then urgently stating he needed bedpan, RN needed to perform I&O cath. Therapist returned 15 min later to work with pt and pt then declining participation in any therapy. Pt reports he has not sleep well past two days d/t pain and is very fatigued. Reapplied R hand splint and provided pt with soft call bell; remained supine with all needs in reach. Continue per POC as pt able to tolerate.     Therapy Documentation Precautions:  Precautions Precautions: Fall, Cervical Required Braces or Orthoses: Cervical Brace Cervical Brace: Hard collar, At all times Restrictions Weight Bearing Restrictions: Yes RLE Weight Bearing: Non weight bearing LLE Weight Bearing: Non weight bearing General: PT Amount of Missed Time (min): 60 Minutes PT Missed Treatment Reason: Nursing care;Patient fatigue   See Function Navigator for Current Functional  Status.   Therapy/Group: Individual Therapy  [name] 06/18/2017, 10:40 AM

## 2017-06-18 NOTE — Progress Notes (Signed)
Occupational Therapy Note  Patient Details  Name: Ray Gervasi MRN: [id number] Date of Birth: [birth year]  Today's Date: 06/18/2017 OT Individual Time: 1300-1330 OT Individual Time Calculation (min): 30 min  and Today's Date: 06/18/2017 OT Missed Time: 30 Minutes Missed Time Reason: Patient fatigue  Pt c/o ongoing abdominal pain but commented that pain is much improved since removal of PEG tube Individual Therapy  Pt asleep upon arrival but easily aroused.  Pt noted with difficulty keeping eyes open.  Focus on bed mobility and BUE AAROM/PROM.  Pt initiates rolling in bed but requires max A to complete task.  Pt states that his abdomen is still painful with activity but much improved.  Pt had difficulty keeping his eyes open and attending to commands.  Pt remained in room with soft call bell in LUE grasp.    [name] [facility] 06/18/2017, 1:57 PM
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
## 2017-06-18 NOTE — Progress Notes (Signed)
Occupational Therapy Session Note  Patient Details  Name: [name] MRN: [id number] Date of Birth: 09-28-1965  Today's Date: 06/18/2017 OT Individual Time: 5852-7782 OT Individual Time Calculation (min): 35 min  and Today's Date: 06/18/2017 OT Missed Time: 25 Minutes Missed Time Reason: Pain   Short Term Goals: Week 4:  OT Short Term Goal 1 (Week 4): Pt will maintain a functional grasp with max Aduring 1 grooming task for 1 minute OT Short Term Goal 2 (Week 4): Pt will perform slide board transfer to drop arm BSC with max A OT Short Term Goal 3 (Week 4): Pt will roll to R/L in bed with mod A to facilitate LB dressing tasks at bed level  Skilled Therapeutic Interventions/Progress Updates:    Pt resting in bed upon arrival.  Pt stated he had a "rough night." Pt continues to c/o increased abdominal pain with transitional movements. Pt engaged in [place] activities at bed level.  Pt declined donning clothing secondary to uncertainty about possible procedure for PEG tube and removal of oral screws.  Pt remained in bed with call bell within grasp.  Therapy Documentation Precautions:  Precautions Precautions: Fall, Cervical Required Braces or Orthoses: Cervical Brace Cervical Brace: Hard collar, At all times Restrictions Weight Bearing Restrictions: Yes RLE Weight Bearing: Non weight bearing LLE Weight Bearing: Non weight bearing General: General OT Amount of Missed Time: 25 Minutes Pain:  Increased abdominal pain with movement; RN aware and repositioned in bed  See Function Navigator for Current Functional Status.    [name] [facility] 06/18/2017, 7:47 AM
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
## 2017-06-19 ENCOUNTER — Inpatient Hospital Stay (HOSPITAL_COMMUNITY): Payer: Self-pay | Admitting: Speech Pathology

## 2017-06-19 ENCOUNTER — Inpatient Hospital Stay (HOSPITAL_COMMUNITY): Payer: Self-pay | Admitting: Physical Therapy

## 2017-06-19 ENCOUNTER — Inpatient Hospital Stay (HOSPITAL_COMMUNITY): Payer: Self-pay | Admitting: *Deleted

## 2017-06-19 ENCOUNTER — Inpatient Hospital Stay (HOSPITAL_COMMUNITY): Payer: Self-pay

## 2017-06-19 MED ORDER — SIMETHICONE 40 MG/0.6ML PO SUSP: 80.0000 mg | Freq: Four times a day (QID) | ORAL | Status: DC | PRN

## 2017-06-19 MED ORDER — OXYCODONE HCL 5 MG PO TABS
5.0000 mg | ORAL_TABLET | Freq: Four times a day (QID) | ORAL | Status: DC | PRN
  Administered 2017-06-19 – 2017-07-14 (×23): 5 mg via ORAL
  Filled 2017-06-19 (×24): qty 1

## 2017-06-19 NOTE — Progress Notes (Signed)
Speech Language Pathology Daily Session Note  Patient Details  Name: [name] MRN: [id number] Date of Birth: [birth year]  Today's Date: 06/19/2017 SLP Individual Time: 1500-1530 SLP Individual Time Calculation (min): 30 min  Short Term Goals: Week 4: SLP Short Term Goal 1 (Week 4): Pt will identify at least 2 new deficits and their impact on his functional independence with Min assist verbal cues.   SLP Short Term Goal 2 (Week 4): Pt will verbalize at least 2 safety precautions with min assist verbal cues.   SLP Short Term Goal 3 (Week 4): Pt will direct self care by answering questions regarding PO intake (i.e., amount of intake, cup vs spoon) with Min A cues.  SLP Short Term Goal 4 (Week 4): Pt will consume full liquid diet with Min overt s/s of aspiration and supervision cues for use of compensatory strategies.   Skilled Therapeutic Interventions:  Pt was seen for skilled ST targeting dysphagia goals.  SLP facilitated the session with trials of dys 2 yogurt with mixed fruit consistencies to continue working towards diet progression.  Pt demonstrated no difficulty masticating soft, minced solids despite decreased mandibular ROM s/p fixation.  Pt demonstrated no overt s/s of aspiration with solids or liquids.  After discussion with pt and PA, recommend advancing pt to dys 2 diet with thin liquids.  Pt is scheduled for OR procedure on 10/15 for removal of arch bars, will proceed with further advancement of solids following surgery.  Pt left in bed with call bell within reach.  Continue per current plan of care.    Function:  Eating Eating   Modified Consistency Diet: Yes Eating Assist Level: Helper feeds patient           Cognition Comprehension Comprehension assist level: Follows complex conversation/direction with extra time/assistive device  Expression   Expression assist level: Expresses complex ideas: With extra time/assistive device  Social Interaction Social  Interaction assist level: Interacts appropriately with others - No medications needed.  Problem Solving Problem solving assist level: Solves basic 90% of the time/requires cueing < 10% of the time  Memory Memory assist level: Recognizes or recalls 90% of the time/requires cueing < 10% of the time    Pain Pain Assessment Pain Assessment: No/denies pain  Therapy/Group: Individual Therapy  Mechille Varghese, Selinda Orion 06/19/2017, 3:31 PM

## 2017-06-19 NOTE — Progress Notes (Signed)
Occupational Therapy Session Note  Patient Details  Name: [name] MRN: [id number] Date of Birth: 1965/09/20  Today's Date: 06/19/2017 OT Individual Time: 0700-0800 OT Individual Time Calculation (min): 60 min    Short Term Goals: Week 4:  OT Short Term Goal 1 (Week 4): Pt will maintain a functional grasp with max Aduring 1 grooming task for 1 minute OT Short Term Goal 2 (Week 4): Pt will perform slide board transfer to drop arm BSC with max A OT Short Term Goal 3 (Week 4): Pt will roll to R/L in bed with mod A to facilitate LB dressing tasks at bed level  Skilled Therapeutic Interventions/Progress Updates:    Pt resting in bed upon arrival.  Initial focus on dressing LB dressing and bed mobility.  Pt is able to lift B knees to facilitate threading of pants.  Pt attempted to grasp pants but required HOH A to complete task.  Pt required max A +2 for rolling in bed to facilitate pulling pants over hips.  Pt initiated moving BLE to edge of bed in preparation for sitting EOB for transfer to w/c.  Pt required mod A to complete task and max A for supine>sit EOB.  Pt initiated slide on board but required max A to complete transfer.  Pt grasped wash cloth to wash face with support at elbow.  Pt initiated pushing arm through shirt sleeve after threaded.  Pt remained in w/c with all needs within reach.   Therapy Documentation Precautions:  Precautions Precautions: Fall, Cervical Required Braces or Orthoses: Cervical Brace Cervical Brace: Hard collar, At all times Restrictions Weight Bearing Restrictions: Yes RLE Weight Bearing: Non weight bearing LLE Weight Bearing: Non weight bearing   Pain: Pain Assessment Pain Assessment: 0-10 Pain Score: 9  Pain Location: Neck Pain Orientation: Left Pain Onset: On-going Pain Intervention(s): meds admin prior to therapy ;Repositioned  See Function Navigator for Current Functional Status.   Therapy/Group: Individual Therapy  Leroy Libman 06/19/2017, 10:43 AM

## 2017-06-19 NOTE — Progress Notes (Signed)
Pt resting in bed quietly. Easily aroused. Wife at bedside along with family at beginning of shift. Pt with c/o pain and pain management administered and effective. Abdomen distended and pendulous. Firm to palpation. Xray to bilat lower exts were completed r/t f/u for healing. Pt and family educated on the order and necessity. Pt tolerated po meds crushed in puree s/p pegtube removal. Lidocaine patch administered with pt education. Able to make needs known. Continues on bladder scan protocol for urinary retention. Safety maintained. callbell within reach. Will continue to monitor.

## 2017-06-19 NOTE — Progress Notes (Signed)
PHYSICAL MEDICINE & REHABILITATION     PROGRESS NOTE    Subjective/Complaints: Stomach feeling much better this morning after removal of tube. Had a good night. Mother in room.   ROS: pt denies nausea, vomiting, diarrhea, cough, shortness of breath or chest pain   Objective: Vital Signs: Blood pressure 120/72, pulse 79, temperature 98.3 F (36.8 C), temperature source Oral, resp. rate 20, height 5\' 6"  (1.676 m), weight 85.5 kg (188 lb 8.3 oz), SpO2 99 %. Dg Ankle Complete Right  Result Date: 06/18/2017 CLINICAL DATA:  Pain to the ankle and foot.  No known trauma. EXAM: RIGHT ANKLE - COMPLETE 3+ VIEW COMPARISON:  05/07/2017, 05/03/2017. FINDINGS: Deformity of the talus consistent with the known fractures. Alignment of the major fracture fragments does not appear significantly changed from 05/03/2017 CT. No evidence of a superimposed acute fracture. The tarsal fractures also appear unchanged in alignment and position. IMPRESSION: Fracture deformities of the talus and tarsals without change in alignment or position compared 05/03/2017. No new fracture or dislocation is evident. Electronically Signed   By: Andreas Newport M.D.   On: 06/18/2017 23:49   Ct Abdomen Pelvis W Contrast  Result Date: 06/18/2017 CLINICAL DATA:  Umbilical abdominal pain around feeding tube/ PEG site, LEFT lower quadrant pain EXAM: CT ABDOMEN AND PELVIS WITH CONTRAST TECHNIQUE: Multidetector CT imaging of the abdomen and pelvis was performed using the standard protocol following bolus administration of intravenous contrast. Sagittal and coronal MPR images reconstructed from axial data set. CONTRAST:  Dilute oral contrast.  100 cc Isovue-300 IV. COMPARISON:  05/03/2017 FINDINGS: Lower chest: Subsegmental atelectasis at both lung bases Hepatobiliary: Gallbladder and liver normal appearance Pancreas: Normal appearance Spleen: Normal appearance Adrenals/Urinary Tract: Tiny nonobstructing RIGHT renal calculus. Adrenal  glands, kidneys, and ureters normal appearance. Depending high attenuation material within the bladder, could represent excreted absorbed contrast material from earlier PEG tube check or dependent calculi. Stomach/Bowel: PEG tube bumper is located within the LEFT rectus abdominus muscle and appears external to the stomach. Normal appendix. Stomach and bowel loops otherwise normal appearance. Vascular/Lymphatic: Aorta normal caliber with minimal atherosclerotic calcification. No adenopathy. Reproductive: Unremarkable prostate gland and seminal vesicles. Other: No free air or free fluid.  No hernia. Musculoskeletal: Orthopedic hardware proximal LEFT femur. IMPRESSION: Bumper from the PEG tube is located within the the abdominal wall within the LEFT rectus abdominis muscle and appears external to the stomach. Question excreted contrast material versus dependent calculi in urinary bladder. Tiny nonobstructing RIGHT renal calculus. Findings called to Marsh Dolly RN on [facility] on 06/18/2017 at 0026 hours. Electronically Signed   By: Lavonia Dana M.D.   On: 06/18/2017 00:27   Dg Abdomen Peg Tube Location  Result Date: 06/17/2017 CLINICAL DATA:  Evaluate gastrostomy tube placement EXAM: ABDOMEN - 1 VIEW COMPARISON:  06/16/2017 FINDINGS: Left upper quadrant gastrostomy tube was injected with 50 cc of Isovue-300. This resulted in opacification of the stomach confirming intraluminal location of the gastrostomy tube. IMPRESSION: 1. Gastrostomy tube appears intraluminal with opacification of the stomach. Electronically Signed   By: Kerby Moors M.D.   On: 06/17/2017 10:02   Dg Foot Complete Right  Result Date: 06/19/2017 CLINICAL DATA:  Pain to the right foot and ankle. EXAM: RIGHT FOOT COMPLETE - 3+ VIEW COMPARISON:  05/07/2017, 05/03/2017. FINDINGS: Subacute fracture deformities of the tarsals and talus. No change in alignment or position since the CT of 05/03/2017. No evidence of an acute bony injury. No  acute soft tissue abnormality. IMPRESSION: Fracture deformities  of the talus and multiple tarsals, without change in alignment or position from 05/03/2017. Electronically Signed   By: Andreas Newport M.D.   On: 06/19/2017 00:31    Recent Labs  06/17/17 0547 06/18/17 0521  WBC 8.7 6.6  HGB 10.0* 10.4*  HCT 32.2* 32.6*  PLT 328 352    Recent Labs  06/17/17 0547  NA 134*  K 3.8  CL 95*  GLUCOSE 107*  BUN 12  CREATININE 0.43*  CALCIUM 9.9   CBG (last 3)  No results for input(s): GLUCAP in the last 72 hours.  Wt Readings from Last 3 Encounters:  06/19/17 85.5 kg (188 lb 8.3 oz)  05/21/17 99 kg (218 lb 3.2 oz)  01/11/17 91.6 kg (202 lb)    Physical Exam:  Constitutional: He appears well-developedand well-nourished. He is in no distress.  HENT:  Head: Normocephalicand atraumatic.   Eyes:  Unable to move right eye laterally. Left eye limited to lateral field and medial fields--- eye movements unchanged Neck:  Phonation good  Immobilized by Aspen collar. Trach site closed Cardiovascular: RRR without murmur. No JVD            Respiratory: CTA Bilaterally without wheezes or rales. Normal effort      GI: abdomen non-distended. Only slightly tender today. PEG site dressed. Musculoskeletal: He exhibits continued    left thigh/leg with dressing in place Neurological:    Motor 1-2/5 UE especially with wrist, biceps, early tricep movement. Minimal shoulders Trace to absent movement left ankle.   Senses pain stim in all 4. Motor-sensory unchanged.  Left III, VI nerve weakness but seems to have some movement. Right VI deficit--persistent Skin: Skin is warmand dry.  Wounds all dry/healing. Sutures intact LLE Psychiatric: pleasant   Assessment/Plan: 1. Functional deficits, tetraplegia secondary to TBI/cervical SCI which require 3+ hours per day of interdisciplinary therapy in a comprehensive inpatient rehab setting. Physiatrist is providing close team supervision and 24 hour  management of active medical problems listed below. Physiatrist and rehab team continue to assess barriers to discharge/monitor patient progress toward functional and medical goals.  Function:  Bathing Bathing position   Position: Bed  Bathing parts   Body parts bathed by helper: Right arm, Left arm, Chest, Abdomen, Front perineal area, Buttocks, Right upper leg, Left upper leg, Right lower leg, Left lower leg, Back  Bathing assist Assist Level: 2 helpers      Upper Body Dressing/Undressing Upper body dressing   What is the patient wearing?: Button up shirt           Button up shirt - Perfomed by helper: Thread/unthread right sleeve, Thread/unthread left sleeve, Pull shirt around back, Button/unbutton shirt    Upper body assist Assist Level: 2 helpers (Sitting EOB)      Lower Body Dressing/Undressing Lower body dressing   What is the patient wearing?: Pants       Pants- Performed by helper: Thread/unthread right pants leg, Thread/unthread left pants leg, Pull pants up/down                      Lower body assist Assist for lower body dressing: 2 Helpers      Toileting Toileting Toileting activity did not occur: No continent bowel/bladder event   Toileting steps completed by helper: Adjust clothing prior to toileting, Performs perineal hygiene, Adjust clothing after toileting    Toileting assist Assist level: Two helpers   Transfers Chair/bed transfer   Chair/bed transfer method: Lateral scoot Chair/bed transfer  assist level: 2 helpers Chair/bed transfer assistive device: Sliding board Mechanical lift: Maximove   Locomotion Ambulation Ambulation activity did not occur: Safety/medical Editor, commissioning activity did not occur: Safety/medical concerns Type: Manual Max wheelchair distance: 300+ ft Assist Level: Dependent (Pt equals 0%)  Cognition Comprehension Comprehension assist level: Follows complex conversation/direction with  extra time/assistive device  Expression Expression assist level: Expresses complex ideas: With extra time/assistive device  Social Interaction Social Interaction assist level: Interacts appropriately with others - No medications needed.  Problem Solving Problem solving assist level: Solves basic 90% of the time/requires cueing < 10% of the time  Memory Memory assist level: Recognizes or recalls 90% of the time/requires cueing < 10% of the time   Medical Problem List and Plan: 1. Functional, cognitive and mobility deficitssecondary to Moderate TBI, C4 spinal cord injury -CIR PT, OT, speech--hold therapies for now  -ortho may advance weight bearing RLE this week (foot fractures)--ortho to follow up this week. xrays without changes  2. DVT Prophylaxis/Anticoagulation: Pharmaceutical: Lovenox 3. Chronic neck pain/Pain Management: On Gabapentin bid   -changed hydrocodone to oxycodone for breakthrough pain  - fentanyl patch  19mcg/hr    -Headaches are primarly related to occipital-cervical fractures 4. Mood: LCSW to follow for evaluation and support when appropriate.  5. Neuropsych: This patient is notcapable of making decisions on hisown behalf.  -continue deficits in memory/insight 6. Skin/Wound Care: Air mattress overlay. Maintain adequate nutritional and hydration status.  7. Fluids/Electrolytes/Nutrition: Monitor I/O.    -full liquid diet---advance per SLP  -IVF to supplement 9. Left periorbital cellulitis/ CN III/VI palsy:  -continue local care  -follow up per optho 10Left femur fracture s/p IM nail: NWB X 8 weeks.has left foot drop 11. Left open tibial fracture s/p I and D with ORIF: NWB--CAM boot.  11. Left mandibular fracture s/p CR /tongue laceration:   -wires removed, screws now all removed  -advance diet 12. Chemosis left eye: Continue Lacrilube every 3 hours till resolved per opthalmology.  -eye gradually improving. optho recommends ongoing consvt mgt 13.  Facial cellulitis: keflex was completed  . 15. Thrombocytosis: Likely reactive.    16. ABLA: hgb stable at 10.5 17. Abnormal LFTs: Question due to shocked liver.  22. H/o of MDD: Was on Trintellix 5 mg and Risperidone 5 mg bid at home--continue to hold for now.   -behavior reasonable at present 19. Urine retention/neurogenic bladder: foley out  -continue voiding trial with I/O caths 20. ID/septicemia:  -Left lower lobe infiltrate on CXR, wbc's 16k on 9/22---down to 4.6 9/24  -afebrile   -ucx with 100k E Coli  -converted to bactrim---completed  -blood cultures negative .  21. Abdominal/PEG site pain: PEG removed. Pain better  -on po diet  -follow clinically          LOS (Days) 29 A FACE TO FACE EVALUATION WAS PERFORMED  Vincent Simons Elliott, [name] 06/19/2017 8:45 AM

## 2017-06-19 NOTE — Progress Notes (Signed)
Occupational Therapy Note  Patient Details  Name: [name] MRN: [id number] Date of Birth: [birth year]  Today's Date: 06/19/2017 OT Individual Time: 1305-1400 OT Individual Time Calculation (min): 55 min   Pt denies pain Individual Therapy  Pt resting in bed upon arrival, having just finished eating lunch with assistance from NT.  Initial focus on bed mobility with emphasis on moving BLE to edge of bed and sitting EOB.  Pt required mod A for moving BLE to edge of bed and tot A for supine>sit EOB.  Pt required max A for lateral lean and min A for pushing up through LUE to upright position.  Pt initiated slide across on slide board but required tot A to complete.  Pt incontinent of bowel during transfer and returned to bed.  Pt required tot A + 2 for changing clothing and hygiene.  Pt required max A + 2 for rolling in bed.  Pt remained in bed with wife present and all needs within reach.    [name] [facility] 06/19/2017, 2:46 PM

## 2017-06-19 NOTE — Progress Notes (Signed)
Physical Therapy Session Note  Patient Details  Name: [name] MRN: [id number] Date of Birth: [birth year]  Today's Date: 06/19/2017 PT Individual Time: 0830-0930 and 1030-1100 PT Individual Time Calculation (min): 60 min and 30 min (total 90 min)   Short Term Goals: Week 4:  PT Short Term Goal 1 (Week 4): Pt will demonstrate rolling modA +2 R/L PT Short Term Goal 2 (Week 4): Pt will demonstrate dynamic sitting balance minA x10 min PT Short Term Goal 3 (Week 4): Pt will demonstrate supine>sit with modA  Skilled Therapeutic Interventions/Progress Updates: Tx 1: Pt received seated in w/c with nursing staff present; reports pain "not so bad" and slept well last night. Transported via w/c totalA throughout session. Performed UE ergometer x10 min total, occasional rest breaks d/t fatigue, cues for increased speed, and dependent hand placement on/off handles. Transfer w/c <>mat table maxA +2. Transferred into reclining w/c to attempt w/c propulsion. Required +2A hand-over-hand assist for hand placement in shoulder extension, then able to demonstrate minimal push forward with UEs. Pt remained seated in w/c at end of session, reclined and NT alerted to pt request to finish breakfast.   Tx 2: Pt received supine in bed, denies pain and agreeable to treatment. Performed PROM to BLE hip flexion/glutes, hamstrings, hip external rotation, adductors, L knee flexion ROM each held 2x60 sec. BLE short arc quad 2x10 reps with AAROM LLE d/t weight of boot, heel slides AAROM 1x10 reps. Engaged in music therapy throughout session. Remained supine in bed at end of session, all needs in reach.      Therapy Documentation Precautions:  Precautions Precautions: Fall, Cervical Required Braces or Orthoses: Cervical Brace Cervical Brace: Hard collar, At all times Restrictions Weight Bearing Restrictions: Yes RLE Weight Bearing: Non weight bearing LLE Weight Bearing: Non weight bearing   See Function  Navigator for Current Functional Status.   Therapy/Group: Individual Therapy  [name] 06/19/2017, 10:13 AM

## 2017-06-20 ENCOUNTER — Inpatient Hospital Stay (HOSPITAL_COMMUNITY): Payer: Self-pay | Admitting: Physical Therapy

## 2017-06-20 ENCOUNTER — Inpatient Hospital Stay (HOSPITAL_COMMUNITY): Payer: Self-pay | Admitting: Speech Pathology

## 2017-06-20 ENCOUNTER — Inpatient Hospital Stay (HOSPITAL_COMMUNITY): Payer: Self-pay

## 2017-06-20 ENCOUNTER — Inpatient Hospital Stay (HOSPITAL_COMMUNITY): Payer: Self-pay | Admitting: *Deleted

## 2017-06-20 MED ORDER — ADULT MULTIVITAMIN W/MINERALS CH
1.0000 | ORAL_TABLET | Freq: Every day | ORAL | Status: DC
  Administered 2017-06-21 – 2017-07-17 (×27): 1 via ORAL
  Filled 2017-06-20 (×29): qty 1

## 2017-06-20 MED ORDER — PANTOPRAZOLE SODIUM 40 MG PO TBEC
40.0000 mg | DELAYED_RELEASE_TABLET | Freq: Every day | ORAL | Status: DC
  Administered 2017-06-21 – 2017-07-19 (×29): 40 mg via ORAL
  Filled 2017-06-20 (×29): qty 1

## 2017-06-20 MED ORDER — ARTIFICIAL TEARS OPHTHALMIC OINT
TOPICAL_OINTMENT | OPHTHALMIC | Status: DC | PRN
  Administered 2017-06-21 – 2017-07-12 (×3): via OPHTHALMIC
  Filled 2017-06-20: qty 3.5

## 2017-06-20 MED ORDER — INFLUENZA VAC SPLIT QUAD 0.5 ML IM SUSY
0.5000 mL | PREFILLED_SYRINGE | INTRAMUSCULAR | Status: AC | PRN
  Administered 2017-07-18: 0.5 mL via INTRAMUSCULAR
  Filled 2017-06-20: qty 0.5

## 2017-06-20 NOTE — Progress Notes (Signed)
Orthopaedic Trauma Progress Note  S: No orthopaedic issues, excited to start putting weight on right leg  O: Awake and responding to questions LLE: Dressings taken down. Wounds appear healed. No active DF/PF. Endorses sensation to foot RLE: Boot in place  A/P: 1. multiple R foot fractures including talus, lisfranc injury, Cuneiform fxs, MTT head fractures 2-3. Nonop in boot 2. S/p ORIF of pilon/tibial shaft now 5 weeks out -Recommend boot and ROM exercises BLE -May WBAT RLE, continue NWB for LLE (will recommend NWB for left for another 5-6 weeks) -May leave left leg open to air -May follow up in my office in 5-6 weeks if discharged from rehab  Please call with any questions!  Shona Needles, MD [facility] [phone] (phone)

## 2017-06-20 NOTE — Progress Notes (Signed)
Occupational Therapy Session Note  Patient Details  Name: Lawrnce Reyez MRN: [id number] Date of Birth: April 17, 1966  Today's Date: 06/20/2017 OT Individual Time: 0700-0800 OT Individual Time Calculation (min): 60 min    Short Term Goals: Week 4:  OT Short Term Goal 1 (Week 4): Pt will maintain a functional grasp with max Aduring 1 grooming task for 1 minute OT Short Term Goal 2 (Week 4): Pt will perform slide board transfer to drop arm BSC with max A OT Short Term Goal 3 (Week 4): Pt will roll to R/L in bed with mod A to facilitate LB dressing tasks at bed level  Skilled Therapeutic Interventions/Progress Updates:    Focus on bed mobility, BUE/grasp to pull up pants and wash face, sitting balance, slide board transfers, and safety awareness to increase independence with BADLs.  Upon arrival pt stated that he was going to "try to sit EOB without assistance" so he would be ready for therapy.  Reeducated pt on deficits and safety.  Pt verbalized understanding. Pt then stated he wanted to work on his standing.  I reminded pt BUE NWB status.  Pt verbalized understanding.  Pt initiates rolling in bed and grasp of pants but requires max A to complete tasks.  Pt able to grasp wash cloth in LUE/hand but requires assistance at elbow to eliminate gravity when washing face.  Pt requires max A for slide board transfer to w/c and tot A for positioning.  Pt remained in w/c with soft call bell in LUE grasp.   Therapy Documentation Precautions:  Precautions Precautions: Fall, Cervical Required Braces or Orthoses: Cervical Brace Cervical Brace: Hard collar, At all times Restrictions Weight Bearing Restrictions: Yes RLE Weight Bearing: Non weight bearing LLE Weight Bearing: Non weight bearing Pain: Pt denies pain See Function Navigator for Current Functional Status.   Therapy/Group: Individual Therapy  Leroy Libman 06/20/2017, 8:11 AM

## 2017-06-20 NOTE — Progress Notes (Signed)
[facility] PHYSICAL MEDICINE & REHABILITATION     PROGRESS NOTE    Subjective/Complaints: Feeling well today. Excited about his "real meal"---all smiles  ROS: pt denies nausea, vomiting, diarrhea, cough, shortness of breath or chest pain   Objective: Vital Signs: Blood pressure 127/74, pulse 80, temperature 98 F (36.7 C), temperature source Oral, resp. rate 18, height 5\' 6"  (1.676 m), weight 90.7 kg (200 lb), SpO2 98 %. Dg Ankle Complete Right  Result Date: 06/18/2017 CLINICAL DATA:  Pain to the ankle and foot.  No known trauma. EXAM: RIGHT ANKLE - COMPLETE 3+ VIEW COMPARISON:  05/07/2017, 05/03/2017. FINDINGS: Deformity of the talus consistent with the known fractures. Alignment of the major fracture fragments does not appear significantly changed from 05/03/2017 CT. No evidence of a superimposed acute fracture. The tarsal fractures also appear unchanged in alignment and position. IMPRESSION: Fracture deformities of the talus and tarsals without change in alignment or position compared 05/03/2017. No new fracture or dislocation is evident. Electronically Signed   By: Andreas Newport M.D.   On: 06/18/2017 23:49   Dg Foot Complete Right  Result Date: 06/19/2017 CLINICAL DATA:  Pain to the right foot and ankle. EXAM: RIGHT FOOT COMPLETE - 3+ VIEW COMPARISON:  05/07/2017, 05/03/2017. FINDINGS: Subacute fracture deformities of the tarsals and talus. No change in alignment or position since the CT of 05/03/2017. No evidence of an acute bony injury. No acute soft tissue abnormality. IMPRESSION: Fracture deformities of the talus and multiple tarsals, without change in alignment or position from 05/03/2017. Electronically Signed   By: Andreas Newport M.D.   On: 06/19/2017 00:31    Recent Labs  06/18/17 0521  WBC 6.6  HGB 10.4*  HCT 32.6*  PLT 352   No results for input(s): NA, K, CL, GLUCOSE, BUN, CREATININE, CALCIUM in the last 72 hours.  Invalid input(s): CO CBG (last 3)  No  results for input(s): GLUCAP in the last 72 hours.  Wt Readings from Last 3 Encounters:  06/20/17 90.7 kg (200 lb)  05/21/17 99 kg (218 lb 3.2 oz)  01/11/17 91.6 kg (202 lb)    Physical Exam:  Constitutional: He appears well-developedand well-nourished. He is in no distress.  HENT:  Head: Normocephalicand atraumatic.   Eyes:  Unable to move right eye laterally. Left eye limited to lateral field and medial fields--- eye movements unchanged. Sclera appears more injected today Neck:  Phonation good  Immobilized by Aspen collar. Trach site closed Cardiovascular: RRR without murmur. No JVD             Respiratory: CTA Bilaterally without wheezes or rales. Normal effort       GI: abdomen non-distended. PEG site healing . Musculoskeletal: He exhibits continued    left thigh/leg with dressing in place Neurological:    Motor 1-2/5 UE especially with wrist, biceps, early tricep movement. Minimal shoulders Trace to absent movement left ankle.   Senses pain stim in all 4. Motor-sensory stable.  Left III, VI nerve weakness but seems to have some movement. Right VI deficit--persistent Skin: Skin is warmand dry.  Wounds all dry/healing.   Psychiatric: pleasant   Assessment/Plan: 1. Functional deficits, tetraplegia secondary to TBI/cervical SCI which require 3+ hours per day of interdisciplinary therapy in a comprehensive inpatient rehab setting. Physiatrist is providing close team supervision and 24 hour management of active medical problems listed below. Physiatrist and rehab team continue to assess barriers to discharge/monitor patient progress toward functional and medical goals.  Function:  Bathing Bathing position  Position: Bed  Bathing parts   Body parts bathed by helper: Right arm, Left arm, Chest, Abdomen, Front perineal area, Buttocks, Right upper leg, Left upper leg, Right lower leg, Left lower leg, Back  Bathing assist Assist Level: 2 helpers      Upper Body  Dressing/Undressing Upper body dressing   What is the patient wearing?: Button up shirt           Button up shirt - Perfomed by helper: Thread/unthread right sleeve, Thread/unthread left sleeve, Pull shirt around back, Button/unbutton shirt    Upper body assist Assist Level: 2 helpers (Sitting EOB)      Lower Body Dressing/Undressing Lower body dressing   What is the patient wearing?: Pants       Pants- Performed by helper: Thread/unthread right pants leg, Thread/unthread left pants leg, Pull pants up/down                      Lower body assist Assist for lower body dressing: 2 Helpers      Toileting Toileting Toileting activity did not occur: No continent bowel/bladder event   Toileting steps completed by helper: Adjust clothing prior to toileting, Performs perineal hygiene, Adjust clothing after toileting    Toileting assist Assist level: Two helpers   Transfers Chair/bed transfer   Chair/bed transfer method: Lateral scoot Chair/bed transfer assist level: 2 helpers Chair/bed transfer assistive device: Sliding board Mechanical lift: Maximove   Locomotion Ambulation Ambulation activity did not occur: Safety/medical Editor, commissioning activity did not occur: Safety/medical concerns Type: Manual Max wheelchair distance: 15 Assist Level: 2 helpers  Cognition Comprehension Comprehension assist level: Follows complex conversation/direction with extra time/assistive device  Expression Expression assist level: Expresses complex ideas: With extra time/assistive device  Social Interaction Social Interaction assist level: Interacts appropriately with others - No medications needed.  Problem Solving Problem solving assist level: Solves basic 90% of the time/requires cueing < 10% of the time  Memory Memory assist level: Recognizes or recalls 90% of the time/requires cueing < 10% of the time   Medical Problem List and Plan: 1. Functional,  cognitive and mobility deficitssecondary to Moderate TBI, C4 spinal cord injury -CIR PT, OT, speech--hold therapies for now  -ortho to follow up re: weight bearing RLE this week (foot fractures)-  xrays without changes  2. DVT Prophylaxis/Anticoagulation: Pharmaceutical: Lovenox 3. Chronic neck pain/Pain Management: On Gabapentin bid   -changed hydrocodone to oxycodone for breakthrough pain  - fentanyl patch  1mcg/hr    -Headaches are primarly related to occipital-cervical fractures 4. Mood: LCSW to follow for evaluation and support when appropriate.  5. Neuropsych: This patient is notcapable of making decisions on hisown behalf.  -continue deficits in memory/insight 6. Skin/Wound Care: Air mattress overlay. Maintain adequate nutritional and hydration status.  7. Fluids/Electrolytes/Nutrition: Monitor I/O.    -diet advanced to D2 thins. Good inatke so far.     9. Left periorbital cellulitis/ CN III/VI palsy:  -continue local care  -follow up per optho  -needs to use drops/ointment 10Left femur fracture s/p IM nail: NWB X 8 weeks.has left foot drop 11. Left open tibial fracture s/p I and D with ORIF: NWB--CAM boot.  11. Left mandibular fracture s/p CR /tongue laceration:   -wires removed, screws now all removed  -advanced diet 12. Chemosis left eye: Continue Lacrilube every 3 hours till resolved per opthalmology.  -eye gradually improving. optho recommends ongoing consvt mgt 13. Facial cellulitis: keflex  was completed  . 15. Thrombocytosis: Likely reactive.    16. ABLA: hgb stable at 10.5 17. Abnormal LFTs: Question due to shocked liver.  85. H/o of MDD: Was on Trintellix 5 mg and Risperidone 5 mg bid at home--continue to hold for now.   -behavior reasonable at present 19. Urine retention/neurogenic bladder: foley out  -continue  I/O caths as he has no sense of bladder at this point  -attempts to improve emptying would probably only lead to incontinence 20.  ID/septicemia:  -Left lower lobe infiltrate on CXR, wbc's 16k on 9/22---down to 4.6 9/24  -afebrile   -ucx with 100k E Coli  -converted to bactrim---completed  -blood cultures negative .  21. Abdominal/PEG site pain: PEG removed. Pain better  -on po diet            LOS (Days) 30 A FACE TO FACE EVALUATION WAS PERFORMED  Meredith Staggers, MD 06/20/2017 10:27 AM

## 2017-06-20 NOTE — Progress Notes (Signed)
Social Work Patient ID: [name], male   DOB: 1966/03/20, 51 y.o.   MRN: [id number]  Had a long talk with pt's wife yesterday afternoon.  She tearfully agrees that her only d/c option for pt at this time is to pursue SNF.  She is realistic that tx will be limited and that I may not be able to find a local SNF willing to take with Medicaid pending.  She has worked in some area facilities herself and says, "this doesn't make it any better for me."   I met with pt this afternoon and discussed SNF plan with him as well.  He was receptive and understanding of the reasons that a d/c home is simply not yet possible as family cannot provide needed level of care.  He vows to "keep pushing myself no matter where I am."  Will continue to provide updates on bed search and monitor pt's mood.  May benefit from another session with neuropsychology next week.  Melaysia Streed, LCSW

## 2017-06-20 NOTE — Progress Notes (Signed)
Speech Language Pathology Daily Session Note  Patient Details  Name: [name] MRN: [id number] Date of Birth: Feb 26, 1966  Today's Date: 06/20/2017 SLP Individual Time: 1420-1500 SLP Individual Time Calculation (min): 40 min  Short Term Goals: Week 4: SLP Short Term Goal 1 (Week 4): Pt will identify at least 2 new deficits and their impact on his functional independence with Min assist verbal cues.   SLP Short Term Goal 2 (Week 4): Pt will verbalize at least 2 safety precautions with min assist verbal cues.   SLP Short Term Goal 3 (Week 4): Pt will direct self care by answering questions regarding PO intake (i.e., amount of intake, cup vs spoon) with Min A cues.  SLP Short Term Goal 4 (Week 4): Pt will consume full liquid diet with Min overt s/s of aspiration and supervision cues for use of compensatory strategies.   Skilled Therapeutic Interventions:  Pt was seen for skilled ST targeting dysphagia goals.  Pt consumed dys 2 textures and thin liquids with x1 instance of eye watering with stifled cough/throat clear which pt attributed to apple juice "going down the wrong pipe."  Therapist encouraged attempts for strong cough to maximize airway protection.  Pt demonstrated no other overt s/s of aspiration with solids or liquids and was able to effectively clear solids from the oral cavity without difficulty.  Pt directed his care for pacing of feeding, positioning, and other immediate needs with mod I.  Pt was handed off to PT at the end of today's therapy session.  Continue per current plan of care.    Function:  Eating Eating   Modified Consistency Diet: Yes Eating Assist Level: Helper feeds patient           Cognition Comprehension Comprehension assist level: Follows complex conversation/direction with extra time/assistive device  Expression   Expression assist level: Expresses complex ideas: With extra time/assistive device  Social Interaction Social Interaction assist  level: Interacts appropriately with others - No medications needed.  Problem Solving Problem solving assist level: Solves basic 90% of the time/requires cueing < 10% of the time  Memory Memory assist level: Recognizes or recalls 90% of the time/requires cueing < 10% of the time    Pain Pain Assessment Pain Assessment: No/denies pain   Therapy/Group: Individual Therapy  Tacara [facility], Selinda Orion 06/20/2017, 4:27 PM

## 2017-06-20 NOTE — Progress Notes (Signed)
Occupational Therapy Note  Patient Details  Name: [name] MRN: [id number] Date of Birth: 07-21-1966  Today's Date: 06/20/2017 OT Individual Time: 1300-1400 OT Individual Time Calculation (min): 60 min   Pt denies pain Individual Therapy  Pt resting in w/c upon arrival.  Pt transitioned to therapy gym and engaged in gravity eliminated pulley support system with focus on BUE/shoulder AAROM to increase BUE function and independence with BADLs.  LUEstrength.RUE strength and required less assist to complete tasks.  Pt returned to room and remained in w/c with soft call bell in LUE grasp.   [name] [facility] 06/20/2017, 2:53 PM

## 2017-06-20 NOTE — Progress Notes (Signed)
Physical Therapy Weekly Progress Note  Patient Details  Name: Markevious Ehmke MRN: [id number] Date of Birth: [birth year]  Beginning of progress report period: June 13, 2017 End of progress report period: June 20, 2017  Today's Date: 06/20/2017 PT Individual Time: 0915-1000 and 1500-1545 PT Individual Time Calculation (min): 45 min and 45 min (total 90 min)   Patient has met 2 of 3 short term goals.  Patient is making slow progress towards goals. Patient requires maxA for bed mobility in air mattress, maxA for transfers with transfer board however +2 utilized for safety to stabilize equipment. Sitting balance, UE/LE activation and functional use continues to slowly progress. Pt continues to fluctuate with tolerance to therapy with ongoing medical complications. Pt with decreased active participation in therapy 2-3 days this week d/t complications and pain with PEG tube. Pt is motivated to progress towards long term goals and increase independence. Pt's mother occasionally present for sessions, however wife unable to attend sessions regularly due to work schedule. As of today pt cleared for WBAT RLE; plan to progress transfers and standing as appropriate, and patient is very motivated by this.   Patient continues to demonstrate the following deficits muscle weakness, muscle joint tightness and muscle paralysis, decreased cardiorespiratoy endurance, impaired timing and sequencing, abnormal tone, unbalanced muscle activation, ataxia and decreased coordination, decreased visual acuity and decreased visual motor skills and decreased sitting balance, decreased postural control and decreased balance strategies and therefore will continue to benefit from skilled PT intervention to increase functional independence with mobility.  Patient progressing toward long term goals..  Continue plan of care.  PT Short Term Goals Week 4:  PT Short Term Goal 1 (Week 4): Pt will demonstrate rolling modA +2  R/L PT Short Term Goal 1 - Progress (Week 4): Met PT Short Term Goal 2 (Week 4): Pt will demonstrate dynamic sitting balance minA x10 min PT Short Term Goal 2 - Progress (Week 4): Met PT Short Term Goal 3 (Week 4): Pt will demonstrate supine>sit with modA PT Short Term Goal 3 - Progress (Week 4): Not met Week 5:  PT Short Term Goal 1 (Week 5): Pt will demonstrate rolling R/L modA +1 PT Short Term Goal 2 (Week 5): Pt will demonstrate supine >sit with modA using bed features as needed PT Short Term Goal 3 (Week 5): Pt will demonstrate dynamic sitting balance S x10 min  Skilled Therapeutic Interventions/Progress Updates: Tx 1: Pt missed first 15 min of therapy d/t nursing care. Upon therapist's return, pt agreeable to treatment. Per orders pt not WBAT RLE, continue NWB LLE. Pt transported to gym [place] in w/c. Transfer w/c <>mat table maxA +2, pt demonstrating use of RLE to assist with scooting. Sit >supine maxA +1. Pt scooted laterally to/from tilt table with totalA +2. Supine BP122/73. Tolerated standing upright at 60 degrees x7 min with vitals at 1 min 129/82, at 7 min 127/72. No report of increased pain, no symptoms indicative of hypotension. Time spent up in standing limited by time in session, not patient tolerance. Pt very excited by standing; engaged in music therapy during session. Returned to room totalA; remained seated in w/c at end of session, all needs in reach.   Tx 2: Pt received seated in w/c, denies pain and agreeable to treatment. Transfer w/c <>mat table maxA +2 with pt assisting push through RLE. Sit <>stand attempted from elevated mat table with +2 "three muskateers" assist. Unable to reach full standing and poor activation through RLE; assist to maintain LLE  off floor. Sit <>stand x3 in parallel bars with maxA +2, cues for anterior weight shift.  Able to reach near-full stand however heavy assist from therapist to maintain position. Pt perseverative on how kinesiotape on ankle might  help him stand more easily. Provided extensive education to pt on reasons why attempts to stand are very difficult, including SCI on top of healing fractures, prolonged bedrest and muscle atrophy. Reinforced that process to return to standing/walking will be extensive and difficult. Pt determined to work hard, again reporting that kinesiotape might help. Returned to bed maxA +2 at end of session. Remained supine with all needs in reach. Made up 15 min missed from AM session during this session.      Therapy Documentation Precautions:  Precautions Precautions: Fall, Cervical Required Braces or Orthoses: Cervical Brace Cervical Brace: Hard collar, At all times Other Brace/Splint: cam boot R LE; cam boot LLE, B resting hand splints for night use Restrictions Weight Bearing Restrictions: Yes RLE Weight Bearing: Weight bearing as tolerated LLE Weight Bearing: Non weight bearing   See Function Navigator for Current Functional Status.  Therapy/Group: Individual Therapy  [name] 06/20/2017, 10:10 AM

## 2017-06-21 ENCOUNTER — Inpatient Hospital Stay (HOSPITAL_COMMUNITY): Payer: Self-pay | Admitting: Speech Pathology

## 2017-06-21 ENCOUNTER — Inpatient Hospital Stay (HOSPITAL_COMMUNITY): Payer: Self-pay

## 2017-06-21 ENCOUNTER — Inpatient Hospital Stay (HOSPITAL_COMMUNITY): Payer: Self-pay | Admitting: Physical Therapy

## 2017-06-21 NOTE — Progress Notes (Signed)
[place] [facility] PHYSICAL MEDICINE & REHABILITATION     PROGRESS NOTE    Subjective/Complaints: No new issues. Asked if he could have some kinesiotape for right leg. Pain seems controlled  ROS: pt denies nausea, vomiting, diarrhea, cough, shortness of breath or chest pain   Objective: Vital Signs: Blood pressure 134/68, pulse 71, temperature 97.6 F (36.4 C), temperature source Oral, resp. rate 14, height 5\' 6"  (1.676 m), weight 92.3 kg (203 lb 8 oz), SpO2 99 %. No results found. No results for input(s): WBC, HGB, HCT, PLT in the last 72 hours. No results for input(s): NA, K, CL, GLUCOSE, BUN, CREATININE, CALCIUM in the last 72 hours.  Invalid input(s): CO CBG (last 3)  No results for input(s): GLUCAP in the last 72 hours.  Wt Readings from Last 3 Encounters:  06/21/17 92.3 kg (203 lb 8 oz)  05/21/17 99 kg (218 lb 3.2 oz)  01/11/17 91.6 kg (202 lb)    Physical Exam:  Constitutional: He appears well-developedand well-nourished. He is in no distress.  HENT:  Head: Normocephalicand atraumatic.   Eyes:  Unable to move right eye laterally. Left eye limited to lateral field and medial fields--- eye movements unchanged. Sclera appears remains injected today Neck:  Phonation good  Immobilized by Aspen collar.   Cardiovascular: RRR without murmur. No JVD              Respiratory: CTA Bilaterally without wheezes or rales. Normal effort        GI: abdomen non-distended. PEG site nearly closed . Musculoskeletal: He exhibits continued    left thigh/leg with dressing in place Neurological:    Motor 1-2/5 UE especially with wrist, biceps, early tricep movement. Minimal shoulders Trace to absent movement left ankle.   Senses pain stim in all 4. Motor-sensory stable.  Left III, VI nerve weakness but seems to have some movement. Right VI deficit--persistent Skin: Skin is warmand dry.  Wounds all dry/healing.   Psychiatric: pleasant   Assessment/Plan: 1. Functional deficits, tetraplegia  secondary to TBI/cervical SCI which require 3+ hours per day of interdisciplinary therapy in a comprehensive inpatient rehab setting. Physiatrist is providing close team supervision and 24 hour management of active medical problems listed below. Physiatrist and rehab team continue to assess barriers to discharge/monitor patient progress toward functional and medical goals.  Function:  Bathing Bathing position   Position: Bed  Bathing parts   Body parts bathed by helper: Right arm, Left arm, Chest, Abdomen, Front perineal area, Buttocks, Right upper leg, Left upper leg, Right lower leg, Left lower leg, Back  Bathing assist Assist Level: 2 helpers      Upper Body Dressing/Undressing Upper body dressing   What is the patient wearing?: Button up shirt           Button up shirt - Perfomed by helper: Thread/unthread right sleeve, Thread/unthread left sleeve, Pull shirt around back, Button/unbutton shirt    Upper body assist Assist Level: 2 helpers (Sitting EOB)      Lower Body Dressing/Undressing Lower body dressing   What is the patient wearing?: Pants       Pants- Performed by helper: Thread/unthread right pants leg, Thread/unthread left pants leg, Pull pants up/down                      Lower body assist Assist for lower body dressing: 2 Helpers      Toileting Toileting Toileting activity did not occur: No continent bowel/bladder event   Toileting steps  completed by helper: Adjust clothing prior to toileting, Performs perineal hygiene, Adjust clothing after toileting    Toileting assist Assist level: Two helpers   Transfers Chair/bed transfer   Chair/bed transfer method: Lateral scoot Chair/bed transfer assist level: 2 helpers Chair/bed transfer assistive device: Sliding board Mechanical lift: Maximove   Locomotion Ambulation Ambulation activity did not occur: Safety/medical Editor, commissioning activity did not occur: Safety/medical  concerns Type: Manual Max wheelchair distance: 15 Assist Level: 2 helpers  Cognition Comprehension Comprehension assist level: Follows complex conversation/direction with extra time/assistive device  Expression Expression assist level: Expresses complex ideas: With extra time/assistive device  Social Interaction Social Interaction assist level: Interacts appropriately with others - No medications needed.  Problem Solving Problem solving assist level: Solves basic 90% of the time/requires cueing < 10% of the time  Memory Memory assist level: Recognizes or recalls 90% of the time/requires cueing < 10% of the time   Medical Problem List and Plan: 1. Functional, cognitive and mobility deficitssecondary to Moderate TBI, C4 spinal cord injury -CIR PT, OT, speech--hold therapies for now  -now WBAT RLE in boot  2. DVT Prophylaxis/Anticoagulation: Pharmaceutical: Lovenox 3. Chronic neck pain/Pain Management: On Gabapentin bid   -changed hydrocodone to oxycodone for breakthrough pain  - fentanyl patch  76mcg/hr    -Headaches are primarly related to occipital-cervical fractures 4. Mood: LCSW to follow for evaluation and support when appropriate.  5. Neuropsych: This patient is notcapable of making decisions on hisown behalf.  -continue deficits in memory/insight 6. Skin/Wound Care: Air mattress overlay. Maintain adequate nutritional and hydration status.  7. Fluids/Electrolytes/Nutrition: Monitor I/O.    -diet advanced to D2 thins. Good inatke so far.     9. Left periorbital cellulitis/ CN III/VI palsy:  -continue local care  -follow up per optho  -needs to use drops/ointment 10Left femur fracture s/p IM nail: NWB for another 5-6 weeks from 06/20/17  -peristent left foot drop 11. Left open tibial fracture s/p I and D with ORIF: NWB--CAM boot.  11. Left mandibular fracture s/p CR /tongue laceration:   -wires removed, screws now all removed  -advanced diet 12. Chemosis  left eye: Continue Lacrilube every 3 hours till resolved per opthalmology.  - . optho recommends ongoing consvt mgt  -reiterated need to use drops/lubricate left eye 13. Facial cellulitis: keflex was completed  . 15. Thrombocytosis: Likely reactive.    16. ABLA: hgb stable at 10.5 17. Abnormal LFTs: Question due to shocked liver.  47. H/o of MDD: Was on Trintellix 5 mg and Risperidone 5 mg bid at home--continue to hold for now.   -behavior reasonable at present 19. Urine retention/neurogenic bladder: foley out  -continue  I/O caths as he has no sense of bladder at this point  -attempts to improve emptying would probably only lead to incontinence 20. ID/septicemia:  -Left lower lobe infiltrate on CXR, wbc's 16k on 9/22---down to 4.6 9/24  -afebrile   -ucx with 100k E Coli  -converted to bactrim---completed  -blood cultures negative .  21. Abdominal/PEG site pain: PEG removed. Pain better  -peg site nearly closed            LOS (Days) 31 A FACE TO FACE EVALUATION WAS PERFORMED  Meredith Staggers, MD 06/21/2017 8:56 AM

## 2017-06-21 NOTE — Progress Notes (Signed)
Physical Therapy Session Note  Patient Details  Name: Treyvion Durkee MRN: [id number] Date of Birth: 1966-07-11  Today's Date: 06/21/2017 PT Individual Time: 0900-1000 PT Individual Time Calculation (min): 60 min    Skilled Therapeutic Interventions/Progress Updates: Pt received seated in bed, denies pain and agreeable to treatment. Requests to complete eating breakfast. Therapist provided assist at L elbow to facilitate LUE reaching to use silverware and take bites. Transfer bed<>tilt table totalA lateral slide. Standing tolerance at 45 degrees while performing RLE partial range mini-squats with good activation of RLE extensors, however inconsistent range and fatigues quickly. Returned to supine for one rest break. Standing at 50 degrees x9 min with static stance, vitals stable. Returned to bed totalA +2. Rolling R/L maxA to place bedpan with +2A. Remained on bedpan with NT alerted to pt position, call bell in reach.     Therapy Documentation Precautions:  Precautions Precautions: Fall, Cervical Required Braces or Orthoses: Cervical Brace Cervical Brace: Hard collar, At all times Other Brace/Splint: cam boot R LE; cam boot LLE, B resting hand splints for night use Restrictions Weight Bearing Restrictions: Yes RLE Weight Bearing: Non weight bearing LLE Weight Bearing: Non weight bearing    See Function Navigator for Current Functional Status.   Therapy/Group: Individual Therapy  [name] 06/21/2017, 10:47 AM

## 2017-06-21 NOTE — Progress Notes (Signed)
Occupational Therapy Session Note  Patient Details  Name: Asad Keeven MRN: [id number] Date of Birth: 1966/04/02  Today's Date: 06/21/2017 OT Individual Time: 0700-0800 OT Individual Time Calculation (min): 60 min    Short Term Goals: Week 4:  OT Short Term Goal 1 (Week 4): Pt will maintain a functional grasp with max Aduring 1 grooming task for 1 minute OT Short Term Goal 2 (Week 4): Pt will perform slide board transfer to drop arm BSC with max A OT Short Term Goal 3 (Week 4): Pt will roll to R/L in bed with mod A to facilitate LB dressing tasks at bed level  Skilled Therapeutic Interventions/Progress Updates:    Focus on bed mobility, LB dressing, directing care, unsupported sitting at bed level, BUE AAROM, and safety awareness to increase independence with BADLs.  Pt requires max A for rolling R/L in bed.  Pt exhibiting increased BUE movement L>R/distal>proiximal with weak grasp in LUE with HOH for functional task.  Pt requires min A for unsupported sitting at bed level.  Pt remained in bed with soft call bell in L hand.    Therapy Documentation Precautions:  Precautions Precautions: Fall, Cervical Required Braces or Orthoses: Cervical Brace Cervical Brace: Hard collar, At all times Other Brace/Splint: cam boot R LE; cam boot LLE, B resting hand splints for night use Restrictions Weight Bearing Restrictions: Yes RLE Weight Bearing: Weight bearing as tolerated LLE Weight Bearing: Non weight bearing Pain: Pain Assessment Pain Score: Asleep  See Function Navigator for Current Functional Status.   Therapy/Group: Individual Therapy  Leroy Libman 06/21/2017, 3:03 PM

## 2017-06-21 NOTE — Progress Notes (Signed)
Speech Language Pathology Weekly Progress and Session Note  Patient Details  Name: [name] MRN: [id number] Date of Birth: 1965/12/20  Beginning of progress report period: June 14, 2017 End of progress report period: June 21, 2017  Today's Date: 06/21/2017 SLP Individual Time: 3428-7681 SLP Individual Time Calculation (min): 60 min  Short Term Goals: Week 4: SLP Short Term Goal 1 (Week 4): Pt will identify at least 2 new deficits and their impact on his functional independence with Min assist verbal cues.   SLP Short Term Goal 1 - Progress (Week 4): Not met SLP Short Term Goal 2 (Week 4): Pt will verbalize at least 2 safety precautions with min assist verbal cues.   SLP Short Term Goal 2 - Progress (Week 4): Not met SLP Short Term Goal 3 (Week 4): Pt will direct self care by answering questions regarding PO intake (i.e., amount of intake, cup vs spoon) with Min A cues.  SLP Short Term Goal 3 - Progress (Week 4): Met SLP Short Term Goal 4 (Week 4): Pt will consume full liquid diet with Min overt s/s of aspiration and supervision cues for use of compensatory strategies.  SLP Short Term Goal 4 - Progress (Week 4): Met    New Short Term Goals: Week 5: SLP Short Term Goal 1 (Week 5): Pt will identify at least 2 new deficits and their impact on his functional independence with Min assist verbal cues.   SLP Short Term Goal 2 (Week 5): Pt will verbalize at least 2 safety precautions with min assist verbal cues.   SLP Short Term Goal 3 (Week 5): Pt will consume dysphagia 2 with minimal s/s of aspiration and supervision cues for compesnatory swallow strategies.  SLP Short Term Goal 4 (Week 5): Pt will consume trials of dsyphagia 3 with complete oral clearing and minimal s/s of aspiration to demonstrate readiness for diet upgrade once MD allows.   Weekly Progress Updates: Pt continues to make good progress in ST sessions and as a result he has met several of his goals and is  tolerating diet upgrade to dysphagia 2 with thin liquids. Skilled ST continues to required to further advance diet and increase functional independence with cognitive abilities.      Intensity: Minumum of 1-2 x/day, 30 to 90 minutes Frequency: 3 to 5 out of 7 days Duration/Length of Stay: 1 week Treatment/Interventions: Cognitive remediation/compensation;Cueing hierarchy;Dysphagia/aspiration precaution training;Functional tasks;Patient/family education;Internal/external aids;Speech/Language facilitation;Environmental controls;Therapeutic Activities   Daily Session  Skilled Therapeutic Interventions: Skilled treatment session focused on dysphagia and cognition goals. SLP facilitated session by providing skilled observation of pt consuming thin liquids (slushy). Pt with no overt s.s of aspiration. Pt able to demonstrate anticipatory awareness of task that we would need help with upon discharge with Min A.      Function:   Eating Eating   Modified Consistency Diet: No Eating Assist Level: Helper feeds patient     Helper Scoops Food on Utensil: Every scoop Helper Brings Food to Mouth: Every scoop   Cognition Comprehension Comprehension assist level: Follows complex conversation/direction with extra time/assistive device  Expression   Expression assist level: Expresses complex ideas: With extra time/assistive device  Social Interaction Social Interaction assist level: Interacts appropriately with others with medication or extra time (anti-anxiety, antidepressant).  Problem Solving Problem solving assist level: Solves basic 90% of the time/requires cueing < 10% of the time  Memory Memory assist level: Recognizes or recalls 90% of the time/requires cueing < 10% of the time  General    Pain Pain Assessment Pain Score: Asleep  Therapy/Group: Individual Therapy  Angelo Caroll 06/21/2017, 5:41 PM

## 2017-06-21 NOTE — Progress Notes (Signed)
Occupational Therapy Weekly Progress Note  Patient Details  Name: Trayvion Embleton MRN: [id number] Date of Birth: 01/06/1966  Beginning of progress report period: June 14, 2017 End of progress report period: June 21, 2017  Patient has met 2 of 3 short term goals. Pt is making slow but steady progress with bed mobility, static sitting balance, functional transfers, and increased BUE use for functional tasks.  Pt is independence with directing care.  Pt continues to require min verbal cues for safety awareness and awareness of deficits.  Pt required max A for rolliing in bed to facilitate LB dressing tasks.  Pt is able to use his LUE to grasp built up feeding utensils and wash cloth but requires support at elbow to complete self feeding and grooming tasks. Pt's wife has been present and participated in therapy. Patient continues to demonstrate the following deficits: abnormal posture, acute pain, cognitive deficits and coordination disorder, tetraplegia and therefore will continue to benefit from skilled OT intervention to enhance overall performance with BADL and Reduce care partner burden.  Patient progressing toward long term goals..  Continue plan of care.  OT Short Term Goals Week 4:  OT Short Term Goal 1 (Week 4): Pt will maintain a functional grasp with max Aduring 1 grooming task for 1 minute OT Short Term Goal 1 - Progress (Week 4): Met OT Short Term Goal 2 (Week 4): Pt will perform slide board transfer to drop arm BSC with max A OT Short Term Goal 2 - Progress (Week 4): Met OT Short Term Goal 3 (Week 4): Pt will roll to R/L in bed with mod A to facilitate LB dressing tasks at bed level OT Short Term Goal 3 - Progress (Week 4): Progressing toward goal Week 5:  OT Short Term Goal 1 (Week 5): Pt will roll to R/L in bed with mod A to facilitate LB dressing tasks at bed level OT Short Term Goal 2 (Week 5): Pt will perform lateral leans sitting EOB with mod A to facilitate placement  of slide board OT Short Term Goal 3 (Week 5): Pt will maintain dynamic sitting balance EOB with mod A to facilitate UB dressing tasks      Therapy Documentation Precautions:  Precautions Precautions: Fall, Cervical Required Braces or Orthoses: Cervical Brace Cervical Brace: Hard collar, At all times Other Brace/Splint: cam boot R LE; cam boot LLE, B resting hand splints for night use Restrictions Weight Bearing Restrictions: Yes RLE Weight Bearing: Weight bearing as tolerated LLE Weight Bearing: Non weight bearing    See Function Navigator for Current Functional Status.    [name] [facility] 06/21/2017, 3:16 PM

## 2017-06-21 NOTE — Progress Notes (Signed)
Physical Therapy Session Note  Patient Details  Name: [name] MRN: [id number] Date of Birth: September 13, 1965  Today's Date: 06/21/2017 PT Individual Time: 1036-1100 PT Individual Time Calculation (min): 24 min   Short Term Goals: Week 5:  PT Short Term Goal 1 (Week 5): Pt will demonstrate rolling R/L modA +1 PT Short Term Goal 2 (Week 5): Pt will demonstrate supine >sit with modA using bed features as needed PT Short Term Goal 3 (Week 5): Pt will demonstrate dynamic sitting balance S x10 min  Skilled Therapeutic Interventions/Progress Updates:    session started 6 min late due to catheterization by NT.   Focused on supine neuro re-ed for BLE motor activation and education. Engaged in hip flexion, hip abduction/adduction, quad sets and SLR with active assisted movement. Pt with questions about kinesiotape and educated pt on purpose and technique - able to recall information accurately to next therapist. Also discussed progression of standing attempts (use of tilt table vs standing with RW) in relation to goals and reality of continue WB restriction on LLE as well as decreased grip and strength in BUE and how this affects ability for sit <> stand. Pt verbalized understanding and in agreement. (Primary PT and OT notified of this discussion). Handoff to OT for next session.   Therapy Documentation Precautions:  Precautions Precautions: Fall, Cervical Required Braces or Orthoses: Cervical Brace Cervical Brace: Hard collar, At all times Other Brace/Splint: cam boot R LE; cam boot LLE, B resting hand splints for night use Restrictions Weight Bearing Restrictions: Yes RLE Weight Bearing: WBAT LLE Weight Bearing: Non weight bearing General: PT Amount of Missed Time (min): 6 Minutes PT Missed Treatment Reason: Nursing care (catheterization) Pain:  No complaints.    See Function Navigator for Current Functional Status.   Therapy/Group: Individual Therapy  Canary Brim Ivory Broad, PT, DPT  06/21/2017, 12:17 PM

## 2017-06-21 NOTE — Progress Notes (Addendum)
Occupational Therapy Session Note  Patient Details  Name: Jamari Moten MRN: [id number] Date of Birth: 04-13-1966  Today's Date: 06/21/2017 OT Individual Time: 5427-0623 OT Individual Time Calculation (min): 25 min (make up time)   Short Term Goals: Week 4:  OT Short Term Goal 1 (Week 4): Pt will maintain a functional grasp with max Aduring 1 grooming task for 1 minute OT Short Term Goal 2 (Week 4): Pt will perform slide board transfer to drop arm BSC with max A OT Short Term Goal 3 (Week 4): Pt will roll to R/L in bed with mod A to facilitate LB dressing tasks at bed level  Skilled Therapeutic Interventions/Progress Updates:    1:1. No pain reported. Pt willing to participate in make up time with focus on self feeding. OT applies red foam handle to utensil. With elbow supported by OT, pt able to scoop food and complete hand to mouth excursion with MOD A. Pt able to grasp cup and milk carton with VC to "open/close big" to drink thin liquids through a straw. Exited session with RN administering medication.  Therapy Documentation Precautions:  Precautions Precautions: Fall, Cervical Required Braces or Orthoses: Cervical Brace Cervical Brace: Hard collar, At all times Other Brace/Splint: cam boot R LE; cam boot LLE, B resting hand splints for night use Restrictions Weight Bearing Restrictions: Yes RLE Weight Bearing: Non weight bearing LLE Weight Bearing: Non weight bearing Other Treatments:    See Function Navigator for Current Functional Status.   Therapy/Group: Individual Therapy  Tonny Branch 06/21/2017, 8:50 AM

## 2017-06-21 NOTE — Progress Notes (Signed)
Occupational Therapy Note  Patient Details  Name: [name] MRN: [id number] Date of Birth: [birth year]  Today's Date: 06/21/2017 OT Individual Time: 1100-1200 OT Individual Time Calculation (min): 60 min   Pt denies pain Individual therapy  Pt resting in w/c upon arrival.  Pt engaged in [place] activities with [place] mobile arm support.  Pt reached across midline to grasp bean bags and place a ipsilateral knee.  Counterweights utilized to reduce gravity.  RUE requires more weight than LUE.  Focus on increased BUE functional movement to increase independence with BADLs.    [name] [facility] 06/21/2017, 3:07 PM

## 2017-06-22 ENCOUNTER — Inpatient Hospital Stay (HOSPITAL_COMMUNITY): Payer: Self-pay | Admitting: Physical Therapy

## 2017-06-22 ENCOUNTER — Inpatient Hospital Stay (HOSPITAL_COMMUNITY): Payer: BC Managed Care – PPO | Admitting: Occupational Therapy

## 2017-06-22 MED ORDER — PHENAZOPYRIDINE HCL 100 MG PO TABS
100.0000 mg | ORAL_TABLET | Freq: Three times a day (TID) | ORAL | Status: DC
  Administered 2017-06-22 – 2017-06-26 (×11): 100 mg via ORAL
  Filled 2017-06-22 (×13): qty 1

## 2017-06-22 NOTE — Progress Notes (Signed)
[facility] PHYSICAL MEDICINE & REHABILITATION     PROGRESS NOTE    Subjective/Complaints: Freq urges to urinate per RN, pt on ICP program, cath vols 500-775ml  ROS: pt denies nausea, vomiting, diarrhea, cough, shortness of breath or chest pain   Objective: Vital Signs: Blood pressure 126/76, pulse 94, temperature 98.2 F (36.8 C), temperature source Axillary, resp. rate 12, height 5\' 6"  (1.676 m), weight 93.9 kg (207 lb 1.6 oz), SpO2 96 %. No results found. No results for input(s): WBC, HGB, HCT, PLT in the last 72 hours. No results for input(s): NA, K, CL, GLUCOSE, BUN, CREATININE, CALCIUM in the last 72 hours.  Invalid input(s): CO CBG (last 3)  No results for input(s): GLUCAP in the last 72 hours.  Wt Readings from Last 3 Encounters:  06/22/17 93.9 kg (207 lb 1.6 oz)  05/21/17 99 kg (218 lb 3.2 oz)  01/11/17 91.6 kg (202 lb)    Physical Exam:  Constitutional: He appears well-developedand well-nourished. He is in no distress.  HENT:  Head: Normocephalicand atraumatic.   Eyes:  Unable to move right eye laterally. Left eye limited to lateral field and medial fields--- eye movements unchanged. Sclera appears remains injected today Neck:  Phonation good  Immobilized by Aspen collar.   Cardiovascular: RRR without murmur. No JVD              Respiratory: CTA Bilaterally without wheezes or rales. Normal effort        GI: abdomen non-distended. PEG site nearly closed . Musculoskeletal: He exhibits continued    left thigh/leg with dressing in place Neurological:    Motor 1-2/5 UE especially with wrist, biceps, early tricep movement. Minimal shoulders Trace to absent movement left ankle.   Senses pain stim in all 4. Motor-sensory stable.  Left III, VI nerve weakness but seems to have some movement. Right VI deficit--persistent Skin: Skin is warmand dry.  Wounds all dry/healing.   Psychiatric: pleasant   Assessment/Plan: 1. Functional deficits, tetraplegia secondary to  TBI/cervical SCI which require 3+ hours per day of interdisciplinary therapy in a comprehensive inpatient rehab setting. Physiatrist is providing close team supervision and 24 hour management of active medical problems listed below. Physiatrist and rehab team continue to assess barriers to discharge/monitor patient progress toward functional and medical goals.  Function:  Bathing Bathing position   Position: Bed  Bathing parts   Body parts bathed by helper: Right arm, Left arm, Chest, Abdomen, Front perineal area, Buttocks, Right upper leg, Left upper leg, Right lower leg, Left lower leg, Back  Bathing assist Assist Level: 2 helpers      Upper Body Dressing/Undressing Upper body dressing   What is the patient wearing?: Hospital gown           Button up shirt - Perfomed by helper: Thread/unthread right sleeve, Thread/unthread left sleeve, Pull shirt around back, Button/unbutton shirt    Upper body assist Assist Level: 2 helpers (Sitting EOB)      Lower Body Dressing/Undressing Lower body dressing   What is the patient wearing?: Hospital Gown       Pants- Performed by helper: Thread/unthread right pants leg, Thread/unthread left pants leg, Pull pants up/down                      Lower body assist Assist for lower body dressing: 2 Helpers      Toileting Toileting Toileting activity did not occur: No continent bowel/bladder event   Toileting steps completed by helper:  Adjust clothing prior to toileting, Performs perineal hygiene, Adjust clothing after toileting    Toileting assist Assist level: Two helpers   Transfers Chair/bed transfer   Chair/bed transfer method: Lateral scoot Chair/bed transfer assist level: 2 helpers Chair/bed transfer assistive device: Sliding board Mechanical lift: Maximove   Locomotion Ambulation Ambulation activity did not occur: Safety/medical Editor, commissioning activity did not occur: Safety/medical  concerns Type: Manual Max wheelchair distance: 15 Assist Level: 2 helpers  Cognition Comprehension Comprehension assist level: Follows complex conversation/direction with extra time/assistive device  Expression Expression assist level: Expresses complex ideas: With extra time/assistive device  Social Interaction Social Interaction assist level: Interacts appropriately with others - No medications needed.  Problem Solving Problem solving assist level: Solves basic 90% of the time/requires cueing < 10% of the time  Memory Memory assist level: Recognizes or recalls 90% of the time/requires cueing < 10% of the time   Medical Problem List and Plan: 1. Functional, cognitive and mobility deficitssecondary to Moderate TBI, C4 spinal cord injury -CIR PT, OT, speech--hold therapies for now  -now WBAT RLE in boot  2. DVT Prophylaxis/Anticoagulation: Pharmaceutical: Lovenox 3. Chronic neck pain/Pain Management: On Gabapentin bid   -changed hydrocodone to oxycodone for breakthrough pain  - fentanyl patch  74mcg/hr    -Headaches are primarly related to occipital-cervical fractures 4. Mood: LCSW to follow for evaluation and support when appropriate.  5. Neuropsych: This patient is notcapable of making decisions on hisown behalf.  -continue deficits in memory/insight 6. Skin/Wound Care: Air mattress overlay. Maintain adequate nutritional and hydration status.  7. Fluids/Electrolytes/Nutrition: Monitor I/O.    -diet advanced to D2 thins. Good inatke so far.     9. Left periorbital cellulitis/ CN III/VI palsy:  -continue local care  -follow up per optho  -needs to use drops/ointment 10Left femur fracture s/p IM nail: NWB for another 5-6 weeks from 06/20/17  -peristent left foot drop 11. Left open tibial fracture s/p I and D with ORIF: NWB--CAM boot.  11. Left mandibular fracture s/p CR /tongue laceration:   -wires removed, screws now all removed  -advanced diet 12. Chemosis  left eye: Continue Lacrilube every 3 hours till resolved per opthalmology.  - . optho recommends ongoing consvt mgt  -reiterated need to use drops/lubricate left eye 13. Facial cellulitis: keflex was completed  . 15. Thrombocytosis: Likely reactive.    16. ABLA: hgb stable at 10.5 17. Abnormal LFTs: Question due to shocked liver.  51. H/o of MDD: Was on Trintellix 5 mg and Risperidone 5 mg bid at home--continue to hold for now.   -behavior reasonable at present 19. Urine retention/neurogenic bladder: foley out  -continue  I/O caths as he has no sense of bladder at this point  -attempts to improve emptying would probably only lead to incontinence Bladder spasm trial pyridium 20. ID/septicemia:  -Left lower lobe infiltrate on CXR, wbc's 16k on 9/22---down to 4.6 9/24  -afebrile   -ucx with 100k E Coli  -converted to bactrim---completed  -blood cultures negative .              LOS (Days) [place] EVALUATION WAS PERFORMED  Charlett Blake, MD 06/22/2017 12:02 PM

## 2017-06-22 NOTE — Progress Notes (Signed)
Physical Therapy Session Note  Patient Details  Name: [name] MRN: [id number] Date of Birth: Apr 29, 1966  Today's Date: 06/22/2017 PT Individual Time: 0900-1000 PT Individual Time Calculation (min): 60 min   Short Term Goals: Week 5:  PT Short Term Goal 1 (Week 5): Pt will demonstrate rolling R/L modA +1 PT Short Term Goal 2 (Week 5): Pt will demonstrate supine >sit with modA using bed features as needed PT Short Term Goal 3 (Week 5): Pt will demonstrate dynamic sitting balance S x10 min  Skilled Therapeutic Interventions/Progress Updates: Pt received seated in bed with wife performing lower body dressing. Denies pain and agreeable to treatment. Transfer bed<>tilt table totalA +2 lateral scoot. Standing frame performed at 45 degrees 2x6-8 min each with LLE maintained NWB, pillow behind R knee to prevent recurvatum while performing mini-squats. On second trial pt performed static stance, c/o increased R ankle pain. Activity ceased d/t pain. Supine>sit maxA. Transfer bed>w/c maxA, +2 to stabilize equipment. Performed UE ergometer 2x5 min, level 1 with cues for increased speed. Remained semi-reclined in w/c at end of session, all needs in reach.      Therapy Documentation Precautions:  Precautions Precautions: Fall, Cervical Required Braces or Orthoses: Cervical Brace Cervical Brace: Hard collar, At all times Other Brace/Splint: cam boot R LE; cam boot LLE, B resting hand splints for night use Restrictions Weight Bearing Restrictions: Yes RLE Weight Bearing: Weight bearing as tolerated LLE Weight Bearing: Non weight bearing   See Function Navigator for Current Functional Status.   Therapy/Group: Individual Therapy  [name] 06/22/2017, 9:51 AM

## 2017-06-22 NOTE — Progress Notes (Signed)
Pt resting in bed quietly. Easily aroused with "pain the bladder and feels full." Pt is noted to ask staff NT to cath every 3-4 hours. Pt and wife educated this shift on the importance of attempting to void during the 3-4 hour instead of the IC due to the increased risk of both infection and urethral trauma. Pt is also noted to perseverate over bladder "pain" with noted increased anxiety. Pt would benefit from a medication review to address the possibility of bladder spasms that could be a trigger to the "bladder pain" experienced. Pt would also benefit from having the possibility of adjusting the order for IC from Q 6 hours to a shorter distance to assist in the "bladder pain" experienced. Both wife and pt educated on the plan of care that is currently in place and are aware that any changes would be at the discretion of the provider based on the clinical picture. Safety maintained. callbell within reach. Will continue to monitor.

## 2017-06-22 NOTE — Progress Notes (Signed)
Occupational Therapy Session Note  Patient Details  Name: [name] MRN: [id number] Date of Birth: Dec 23, 1965  Today's Date: 06/22/2017 OT Individual Time: 1430-1530 OT Individual Time Calculation (min): 60 min    Short Term Goals: Week 5:  OT Short Term Goal 1 (Week 5): Pt will roll to R/L in bed with mod A to facilitate LB dressing tasks at bed level OT Short Term Goal 2 (Week 5): Pt will perform lateral leans sitting EOB with mod A to facilitate placement of slide board OT Short Term Goal 3 (Week 5): Pt will maintain dynamic sitting balance EOB with mod A to facilitate UB dressing tasks  Skilled Therapeutic Interventions/Progress Updates:    Upon entering the room, RN present and had set pt up in bed for lunch. OT assisted pt to EOB with total A. Pt sitting unsupported with close supervision - min A intermittently. Initially hand over hand assistance for pt to feed self with built up utensil in R hand. Therapist supporting pt's back with mod A sitting balance as he fatigued and pt shifting weight anteriorly to obtain food from spoon therapist held. Pt sitting on EOB for 40 minutes before returning to bed. Pt required +2 assistance for sit >supine and repositioning in the bed. Soft call bell placed in pt's left hand at end of session. Bed rails up for safety.  Therapy Documentation Precautions:  Precautions Precautions: Fall, Cervical Required Braces or Orthoses: Cervical Brace Cervical Brace: Hard collar, At all times Other Brace/Splint: cam boot R LE; cam boot LLE, B resting hand splints for night use Restrictions Weight Bearing Restrictions: Yes RLE Weight Bearing: Weight bearing as tolerated LLE Weight Bearing: Non weight bearing  See Function Navigator for Current Functional Status.   Therapy/Group: Individual Therapy  Gypsy Decant 06/22/2017, 4:53 PM

## 2017-06-23 ENCOUNTER — Inpatient Hospital Stay (HOSPITAL_COMMUNITY): Payer: Self-pay | Admitting: Physical Therapy
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
## 2017-06-23 NOTE — Progress Notes (Signed)
Physical Therapy Session Note  Patient Details  Name: [name] MRN: [id number] Date of Birth: June 03, 1966  Today's Date: 06/23/2017 PT Individual Time: 0240-9735 PT Individual Time Calculation (min): 60 min    Skilled Therapeutic Interventions/Progress Updates:  Pt presented in bed with wife completing bathing/dressing. Pt refused tilt table today due to increased pain in R ankle but agreeable to therapy. Performed supine to sit EOB mod/maxA with maxA for scooting to EOB and increasing lateral shift to facilitate scoot. Performed SB transfer maxA x1 (+1 for safety) with PTA providing max cues for head hips relationship and increasing use of RLE. Pt required maxA x 2 for posotioning in [place] chair. Once in chair pt requesting to trial RW. Discussed use of RW vs parallel bars as pt requesting to attempt standing (acknowledging continues to have pain in R ankle with wt bearing activities). Pt transported to rehab gym and attempted standing trials in parallel bars. Pt performed sit to stand x2 with maxA x 2 and max multimodal cues for maintaining NWB on LLE. Initially pt able to maintain NWB on LLW with support then as pt fatigued pt attempting to place wt on LLE. Pt able to maintain standing in bars with maxA approx 45 sec before fatigue. Pt returned to [place] chair and performed arm cycle x 5 min at self selected speed. Pt returned to room and initially agreeable to remain in chair then stating wish to return to bed. Encouraged pt to remain in chair as pt has only been OOB during present session. Pt then demanding to speak with nsg and NT. Pt remained in TIS as PTA left room with RT present and notified nsg.       Therapy Documentation Precautions:  Precautions Precautions: Fall, Cervical Required Braces or Orthoses: Cervical Brace Cervical Brace: Hard collar, At all times Other Brace/Splint: cam boot R LE; cam boot LLE, B resting hand splints for night use Restrictions Weight Bearing  Restrictions: Yes RLE Weight Bearing: Weight bearing as tolerated LLE Weight Bearing: Non weight bearing  See Function Navigator for Current Functional Status.   Therapy/Group: Individual Therapy  Martia Dalby  Baylei Siebels, PTA  06/23/2017, 12:33 PM

## 2017-06-24 ENCOUNTER — Inpatient Hospital Stay (HOSPITAL_COMMUNITY): Payer: Self-pay | Admitting: Physical Therapy

## 2017-06-24 ENCOUNTER — Inpatient Hospital Stay (HOSPITAL_COMMUNITY): Payer: Self-pay | Admitting: Occupational Therapy

## 2017-06-24 ENCOUNTER — Inpatient Hospital Stay (HOSPITAL_COMMUNITY): Payer: Self-pay

## 2017-06-24 NOTE — Progress Notes (Signed)
Occupational Therapy Session Note  Patient Details  Name: [name] MRN: [id number] Date of Birth: February 10, 1966  Today's Date: 06/24/2017 OT Individual Time: 9924-2683 OT Individual Time Calculation (min): 57 min    Short Term Goals: Week 5:  OT Short Term Goal 1 (Week 5): Pt will roll to R/L in bed with mod A to facilitate LB dressing tasks at bed level OT Short Term Goal 2 (Week 5): Pt will perform lateral leans sitting EOB with mod A to facilitate placement of slide board OT Short Term Goal 3 (Week 5): Pt will maintain dynamic sitting balance EOB with mod A to facilitate UB dressing tasks  Skilled Therapeutic Interventions/Progress Updates:    Pt seated in w/c in room upon OT arrival with incontinent BM.  Pt completed SB transfer w/c>EOB towards L with Max A x 1, Min A x 1.  Total A to transition to supine position.  Pt rolled with Max A towards B sides with total Ax2 for clothing management and perineal care.  Pt completed SB transfer to w/c with Mod A x 1, Min A x 1 and repositioned hips in chair with Mod A.  Per pt request, pt participated in BUE arm strengthening activity on arm bike, 2 sets x 4 mins.  Passive stretching performed to bilateral shoulders (flexion and external rotation) to maintain functional ROM.  Pt assisted back to room and performed SB transfer into bed with Max A x 2, total A to transition to supine.  Pt left with NA present in room upon OT departure.  Therapy Documentation Precautions:  Precautions Precautions: Fall, Cervical Required Braces or Orthoses: Cervical Brace Cervical Brace: Hard collar, At all times Other Brace/Splint: cam boot R LE; cam boot LLE, B resting hand splints for night use Restrictions Weight Bearing Restrictions: Yes RLE Weight Bearing: Weight bearing as tolerated LLE Weight Bearing: Non weight bearing Pain: Pain Assessment Pain Assessment: 0-10 Pain Score: 6  Pain Type: Acute pain Pain Location: Back Pain Orientation:  Lower Pain Descriptors / Indicators: Aching;Throbbing Patients Stated Pain Goal: 2 Pain Intervention(s): Medication (See eMAR);Repositioned  See Function Navigator for Current Functional Status.   Therapy/Group: Individual Therapy  Marcella Dubs 06/24/2017, 12:57 PM

## 2017-06-24 NOTE — Progress Notes (Signed)
Speech Language Pathology Daily Session Note  Patient Details  Name: [name] MRN: [id number] Date of Birth: 02-17-1966  Today's Date: 06/24/2017 SLP Individual Time: 1300-1345 SLP Individual Time Calculation (min): 45 min  Short Term Goals: Week 5: SLP Short Term Goal 1 (Week 5): Pt will identify at least 2 new deficits and their impact on his functional independence with Min assist verbal cues.   SLP Short Term Goal 2 (Week 5): Pt will verbalize at least 2 safety precautions with min assist verbal cues.   SLP Short Term Goal 3 (Week 5): Pt will consume dysphagia 2 with minimal s/s of aspiration and supervision cues for compesnatory swallow strategies.  SLP Short Term Goal 4 (Week 5): Pt will consume trials of dsyphagia 3 with complete oral clearing and minimal s/s of aspiration to demonstrate readiness for diet upgrade once MD allows.   Skilled Therapeutic Interventions:  Skilled ST services focused on swallow and cognition skills. SLP facilitated PO consumption of dys 2 providing supervision cues for swallow compensatory strategies and no overt s/s aspiration. Pt demonstrated ability to identify 2 new deficits and their imapact on his funtional ability with Min A verbal and question cues, " I am not able to feed myself , because my left hand is not working." Pt demonstrated ability to recall in detail his safety precaution for transfering and PO consumption with Min A question cues. Pt was left in room with visitors and call bell within reach. Reccoemnd to continue ST services.     Function:  Eating Eating   Modified Consistency Diet: Yes Eating Assist Level: Helper feeds patient     Helper Scoops Food on Utensil: Every scoop Helper Brings Food to Mouth: Every scoop   Cognition Comprehension Comprehension assist level: Follows complex conversation/direction with extra time/assistive device  Expression   Expression assist level: Expresses complex ideas: With extra  time/assistive device  Social Interaction Social Interaction assist level: Interacts appropriately with others - No medications needed.  Problem Solving Problem solving assist level: Solves basic 90% of the time/requires cueing < 10% of the time  Memory Memory assist level: Recognizes or recalls 90% of the time/requires cueing < 10% of the time    Pain Pain Assessment Pain Assessment: (P) 0-10  Therapy/Group: Individual Therapy  Yenty Bloch  [facility] 06/24/2017, 5:01 PM

## 2017-06-24 NOTE — Progress Notes (Signed)
Occupational Therapy Session Note  Patient Details  Name: [name] MRN: [id number] Date of Birth: [birth year]  Today's Date: 06/24/2017 OT Individual Time: 9381-0175 OT Individual Time Calculation (min): 40 min   Short Term Goals: Week 1:  OT Short Term Goal 1 (Week 1): Pt will engage in static sitting for 3 minutes with  mod A or less for sitting balance.  OT Short Term Goal 1 - Progress (Week 1): Progressing toward goal OT Short Term Goal 2 (Week 1): Pt verbalize NWB precautions with 100% accuracy for 3 consecutive sessions.  OT Short Term Goal 2 - Progress (Week 1): Met Week 2:  OT Short Term Goal 1 (Week 2): Pt will engage in static sitting for 3 minutes with  mod A or less for sitting balance.  OT Short Term Goal 1 - Progress (Week 2): Met OT Short Term Goal 2 (Week 2): Pt will direct care for bed mobility in preparation for supine>sit EOB with mod quesitoning verbal cues OT Short Term Goal 2 - Progress (Week 2): Met OT Short Term Goal 3 (Week 2): Pt will recall correct placement of slide board with mod verbal cues OT Short Term Goal 3 - Progress (Week 2): Met  Skilled Therapeutic Interventions/Progress Updates:    Tx focus on bilateral UE NMR during meaningful activities.   Pt greeted supine in bed with HOB elevated, drinking milkshake with NT. Facilitated L UE NMR by having pt grip cup and elevate it to mouth with active assist from OT. Pt with noticeable muscle activation in biceps/deltoids while flexing shoulder to complete this! Pt completing resistive pushes with bilateral UEs (one arm at a time) for proximal strengthening. Easily fatigued, but able to tolerate min resistance Lt>Rt. Had pt grip textured ball and complete bicep curls to chest (no physical assist required for this). Active assist for dancing to favorite music, focus on shoulder flexion and IR/ER bilateral UEs (one at a time) in pain free ranges. At end of tx pt was repositioned to achieve neutral  alignment and left with all needs within reach. Soft call bell near Lt hand.   Therapy Documentation Precautions:  Precautions Precautions: Fall, Cervical Required Braces or Orthoses: Cervical Brace Cervical Brace: Hard collar, At all times Other Brace/Splint: cam boot R LE; cam boot LLE, B resting hand splints for night use Restrictions Weight Bearing Restrictions: Yes RLE Weight Bearing: Weight bearing as tolerated LLE Weight Bearing: Non weight bearing Vital Signs: Therapy Vitals Temp: 98.1 F (36.7 C) Temp Source: Oral Pulse Rate: 68 Resp: 16 BP: 130/66 Patient Position (if appropriate): Lying Oxygen Therapy SpO2: 98 % O2 Device: Not Delivered Pain: Pain Assessment Pain Assessment: (P) 0-10 Pain Score: 6  Pain Type: Acute pain Pain Location: Back Pain Orientation: Lower ADL:     See Function Navigator for Current Functional Status.   Therapy/Group: Individual Therapy  Silvio Sausedo A Lyndol Vanderheiden 06/24/2017, 4:17 PM
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
## 2017-06-24 NOTE — Progress Notes (Signed)
Physical Therapy Session Note  Patient Details  Name: [name] MRN: [id number] Date of Birth: Feb 08, 1966  Today's Date: 06/24/2017 PT Individual Time: 1015-1100 and 1400-1445 PT Individual Time Calculation (min): 45 min and 45 min (total 90 min)   Short Term Goals: Week 5:  PT Short Term Goal 1 (Week 5): Pt will demonstrate rolling R/L modA +1 PT Short Term Goal 2 (Week 5): Pt will demonstrate supine >sit with modA using bed features as needed PT Short Term Goal 3 (Week 5): Pt will demonstrate dynamic sitting balance S x10 min  Skilled Therapeutic Interventions/Progress Updates: Tx 1: Pt missed 15 min of therapy d/t nursing care. Upon therapist's return, pt denies pain and agreeable to treatment. Rolling R/L maxA to don shorts. maxA supine>sit with pt improving LE activation to scoot LEs off bed, and core activation to assist to sitting. Shirt donned EOB; pt able to thread B hands into sleeves but requires maxA to thread arms up, pull shirt over head and trunk. Transfer bed>w/c and w/c <>mat table with maxA>modA with repetition and slight downhill trend, improving RLE activation to assist with scooting. Sit <>stand three muskateers x2 trials; improving RLE activation however still heavy assist. Sit <>stand with eva walker x4 reps with maxA +2 faded to modA +2 or maxA +1 with repetition; therapist stood in front of pt to assist with anterior weight shift, tactile cues for RLE knee/hip extension. Pt verbalized incontinent bowel movement upon standing. Returned to w/c as above; handoff to OT at end of session.  Tx 2:Pt received supine in bed with father and nephew present; denies pain and agreeable to treatment. Rolling maxA to check brief; clean at this time. Supine>sit with maxA and improving LE activation during task. Transfer bed>w/c with maxA +1. Transfer w/c <>mat table modA +2 with transfer board. Sit <>stand x3 trials with eva walker, mod/maxA +2 for anterior weight shift,  maintaining LLE off floor, and R knee/hip extension. Pt requesting to try in parallel bars; attempted x3 with maxA however unable to reach full standing as with eva walker. Returned to room totalA, transfer to bed as above after pt had incontinent bowel movement. Rolling R/L maxA to change brief and perform hygiene totalA. Handoff to NT at end of session for I&O cath.      Therapy Documentation Precautions:  Precautions Precautions: Fall, Cervical Required Braces or Orthoses: Cervical Brace Cervical Brace: Hard collar, At all times Other Brace/Splint: cam boot R LE; cam boot LLE, B resting hand splints for night use Restrictions Weight Bearing Restrictions: Yes RLE Weight Bearing: Weight bearing as tolerated LLE Weight Bearing: Non weight bearing General: PT Amount of Missed Time (min): 15 Minutes PT Missed Treatment Reason: Nursing care Pain: Pain Assessment Pain Assessment: 0-10 Pain Score: 6  Pain Location: Neck Pain Orientation: Posterior Pain Descriptors / Indicators: Aching;Throbbing Patients Stated Pain Goal: 2 Pain Intervention(s): Medication (See eMAR);Repositioned  See Function Navigator for Current Functional Status.   Therapy/Group: Individual Therapy  [name] 06/24/2017, 11:15 AM

## 2017-06-24 NOTE — Progress Notes (Signed)
[place] PHYSICAL MEDICINE & REHABILITATION     PROGRESS NOTE    Subjective/Complaints: Bladder medicine helps spasms. Having neck pain related to collar rubbing on neck . Wants "tape" to help protect neck.   ROS: pt denies nausea, vomiting, diarrhea, cough, shortness of breath or chest pain   Objective: Vital Signs: Blood pressure 125/69, pulse 72, temperature 98.3 F (36.8 C), temperature source Oral, resp. rate 16, height 5\' 6"  (1.676 m), weight 94.1 kg (207 lb 8.8 oz), SpO2 99 %. No results found. No results for input(s): WBC, HGB, HCT, PLT in the last 72 hours. No results for input(s): NA, K, CL, GLUCOSE, BUN, CREATININE, CALCIUM in the last 72 hours.  Invalid input(s): CO CBG (last 3)  No results for input(s): GLUCAP in the last 72 hours.  Wt Readings from Last 3 Encounters:  06/24/17 94.1 kg (207 lb 8.8 oz)  05/21/17 99 kg (218 lb 3.2 oz)  01/11/17 91.6 kg (202 lb)    Physical Exam:  Constitutional: He appears well-developedand well-nourished. He is in no distress.  HENT:  Head: Normocephalicand atraumatic.   Eyes:  Unable to move right eye laterally. Left eye limited to lateral field and medial fields--- eye movements unchanged. Sclera  remains injected today Neck:  Phonation good  Immobilized by Aspen collar which appears to be fitting appropriately Cardiovascular: RRR without murmur. No JVD               Respiratory: CTA Bilaterally without wheezes or rales. Normal effort        GI: abdomen non-distended. PEG site nearly closed . Musculoskeletal: He exhibits continued    left thigh/leg wounds all dry and healing.  Neurological:    Motor 1-2/5 UE especially with wrist, biceps, early tricep movement. Minimal shoulders Trace to absent movement left ankle.   Senses pain stim in all 4. Motor-sensory stable.  Left III, VI nerve weakness . Right VI deficit-no changes in neuro exam Skin: Skin is warmand dry.  Wounds all dry/healing.   Psychiatric:  pleasant   Assessment/Plan: 1. Functional deficits, tetraplegia secondary to TBI/cervical SCI which require 3+ hours per day of interdisciplinary therapy in a comprehensive inpatient rehab setting. Physiatrist is providing close team supervision and 24 hour management of active medical problems listed below. Physiatrist and rehab team continue to assess barriers to discharge/monitor patient progress toward functional and medical goals.  Function:  Bathing Bathing position   Position: Bed  Bathing parts   Body parts bathed by helper: Right arm, Left arm, Chest, Abdomen, Front perineal area, Buttocks, Right upper leg, Left upper leg, Right lower leg, Left lower leg, Back  Bathing assist Assist Level: 2 helpers      Upper Body Dressing/Undressing Upper body dressing   What is the patient wearing?: Hospital gown           Button up shirt - Perfomed by helper: Thread/unthread right sleeve, Thread/unthread left sleeve, Pull shirt around back, Button/unbutton shirt    Upper body assist Assist Level: 2 helpers (Sitting EOB)      Lower Body Dressing/Undressing Lower body dressing   What is the patient wearing?: Hospital Gown       Pants- Performed by helper: Thread/unthread right pants leg, Thread/unthread left pants leg, Pull pants up/down                      Lower body assist Assist for lower body dressing: 2 Automotive engineer activity  did not occur: No continent bowel/bladder event   Toileting steps completed by helper: Adjust clothing prior to toileting, Performs perineal hygiene, Adjust clothing after toileting    Toileting assist Assist level: Two helpers   Transfers Chair/bed transfer   Chair/bed transfer method: Lateral scoot Chair/bed transfer assist level: Total assist (Pt < 25%) Chair/bed transfer assistive device: Sliding board Mechanical lift: Maximove   Locomotion Ambulation Ambulation activity did not occur:  Safety/medical Editor, commissioning activity did not occur: Safety/medical concerns Type: Manual Max wheelchair distance: 15 Assist Level: 2 helpers  Cognition Comprehension Comprehension assist level: Follows complex conversation/direction with extra time/assistive device  Expression Expression assist level: Expresses complex ideas: With extra time/assistive device  Social Interaction Social Interaction assist level: Interacts appropriately with others - No medications needed.  Problem Solving Problem solving assist level: Solves basic 90% of the time/requires cueing < 10% of the time  Memory Memory assist level: Recognizes or recalls 90% of the time/requires cueing < 10% of the time   Medical Problem List and Plan: 1. Functional, cognitive and mobility deficitssecondary to Moderate TBI, C4 spinal cord injury -CIR PT, OT, speech--  -now WBAT RLE in boot  2. DVT Prophylaxis/Anticoagulation: Pharmaceutical: Lovenox 3. Chronic neck pain/Pain Management: On Gabapentin bid   -changed hydrocodone to oxycodone for breakthrough pain  - fentanyl patch  87mcg/hr    -Headaches are primarly related to occipital-cervical fractures  -would like tape around collar to prevent it from rubbing on neck 4. Mood: LCSW to follow for evaluation and support when appropriate.  5. Neuropsych: This patient is notcapable of making decisions on hisown behalf.  -continue deficits in memory/insight 6. Skin/Wound Care: Air mattress overlay. Maintain adequate nutritional and hydration status.  7. Fluids/Electrolytes/Nutrition: Monitor I/O.    -diet advanced to D2 thins. Good inatke so far.     9. Left periorbital cellulitis/ CN III/VI palsy:  -continue local care  -follow up per optho  -needs to use drops/ointment 10Left femur fracture s/p IM nail: NWB for another 5-6 weeks from 06/20/17  -peristent left foot drop 11. Left open tibial fracture s/p I and D with ORIF:  NWB--CAM boot.  11. Left mandibular fracture s/p CR /tongue laceration:   -wires removed, screws now all removed  -advanced diet 12. Chemosis left eye: Continue Lacrilube every 3 hours till resolved per opthalmology.  - . optho recommends ongoing consvt mgt  -reiterated need to use drops/lubricate left eye 13. Facial cellulitis: keflex was completed  15. Thrombocytosis: Likely reactive.    16. ABLA: hgb stable at 10.5 17. Abnormal LFTs: Question due to shocked liver.  68. H/o of MDD: Was on Trintellix 5 mg and Risperidone 5 mg bid at home--continue to hold for now.   -behavior reasonable at present 19. Urine retention/neurogenic bladder: foley out  -continue  I/O caths as he has no sense of bladder at this point  -attempts to improve emptying would probably only lead to incontinence  Bladder spasm trial pyridium- improving  -re-check urine culture              LOS (Days) 34 A FACE TO FACE EVALUATION WAS PERFORMED  Meredith Staggers, MD 06/24/2017 8:49 AM

## 2017-06-25 ENCOUNTER — Inpatient Hospital Stay (HOSPITAL_COMMUNITY): Payer: Self-pay | Admitting: Occupational Therapy

## 2017-06-25 ENCOUNTER — Inpatient Hospital Stay (HOSPITAL_COMMUNITY): Payer: Self-pay | Admitting: Physical Therapy

## 2017-06-25 ENCOUNTER — Inpatient Hospital Stay (HOSPITAL_COMMUNITY): Payer: Self-pay

## 2017-06-25 LAB — URINE CULTURE: Culture: NO GROWTH

## 2017-06-25 NOTE — Progress Notes (Signed)
Physical Therapy Session Note  Patient Details  Name: [name] MRN: [id number] Date of Birth: 08-Feb-1966  Today's Date: 06/25/2017 PT Individual Time: 0900-1000 PT Individual Time Calculation (min): 60 min   Short Term Goals: Week 5:  PT Short Term Goal 1 (Week 5): Pt will demonstrate rolling R/L modA +1 PT Short Term Goal 2 (Week 5): Pt will demonstrate supine >sit with modA using bed features as needed PT Short Term Goal 3 (Week 5): Pt will demonstrate dynamic sitting balance S x10 min  Skilled Therapeutic Interventions/Progress Updates: Pt received supine in bed, denies pain but does report fatigue, and agreeable to treatment. Rolling R/L with mod/maxA to don shorts; noted pt to have had incontinent bowel movement. Performed hygiene and clothing management totalA with pt continuing to demo rolling with variable mod/maxA. Supine>sit maxA.  Shirt donned totalA with min guard sitting balance. Pt reports urgency to use bedpan; returned to supine totalA, rolling totalA to place bedpan. After several minutes pt unable to void, reporting "just gas". Another clean brief and shorts donned. Supine>sit maxA. Transfer bed>w/c maxA +1. Performed BUE ergometer x10 min total; occasional rest breaks due to fatigue and cues for increased speed. Engaged in music therapy while cycling to increase motivation/compliance. Returned to room totalA; remained semi-reclined in w/c at end of session, all needs in reach.      Therapy Documentation Precautions:  Precautions Precautions: Fall, Cervical Required Braces or Orthoses: Cervical Brace Cervical Brace: Hard collar, At all times Other Brace/Splint: cam boot R LE; cam boot LLE, B resting hand splints for night use Restrictions Weight Bearing Restrictions: Yes RLE Weight Bearing: Weight bearing as tolerated LLE Weight Bearing: Non weight bearing  See Function Navigator for Current Functional Status.   Therapy/Group: Individual  Therapy  [name] 06/25/2017, 10:13 AM

## 2017-06-25 NOTE — Progress Notes (Signed)
Occupational Therapy Session Note  Patient Details  Name: [name] MRN: [id number] Date of Birth: July 31, 1966  Today's Date: 06/25/2017 OT Individual Time: 1300-1400 OT Individual Time Calculation (min): 60 min    Short Term Goals: Week 1:  OT Short Term Goal 1 (Week 1): Pt will engage in static sitting for 3 minutes with  mod A or less for sitting balance.  OT Short Term Goal 1 - Progress (Week 1): Progressing toward goal OT Short Term Goal 2 (Week 1): Pt verbalize NWB precautions with 100% accuracy for 3 consecutive sessions.  OT Short Term Goal 2 - Progress (Week 1): Met Week 2:  OT Short Term Goal 1 (Week 2): Pt will engage in static sitting for 3 minutes with  mod A or less for sitting balance.  OT Short Term Goal 1 - Progress (Week 2): Met OT Short Term Goal 2 (Week 2): Pt will direct care for bed mobility in preparation for supine>sit EOB with mod quesitoning verbal cues OT Short Term Goal 2 - Progress (Week 2): Met OT Short Term Goal 3 (Week 2): Pt will recall correct placement of slide board with mod verbal cues OT Short Term Goal 3 - Progress (Week 2): Met Week 3:  OT Short Term Goal 1 (Week 3): Pt will maintain a functional grasp during 1 grooming task for 1 minute OT Short Term Goal 1 - Progress (Week 3): Progressing toward goal OT Short Term Goal 2 (Week 3): Pt will complete 5% of lifting pants over upper legs during dressing without physical assist from helper OT Short Term Goal 2 - Progress (Week 3): Met OT Short Term Goal 3 (Week 3): Pt will complete 1 ADL task EOB for 4 minutes to improve upright tolerance  OT Short Term Goal 3 - Progress (Week 3): Met Week 4:  OT Short Term Goal 1 (Week 4): Pt will maintain a functional grasp with max Aduring 1 grooming task for 1 minute OT Short Term Goal 1 - Progress (Week 4): Met OT Short Term Goal 2 (Week 4): Pt will perform slide board transfer to drop arm BSC with max A OT Short Term Goal 2 - Progress (Week 4):  Met OT Short Term Goal 3 (Week 4): Pt will roll to R/L in bed with mod A to facilitate LB dressing tasks at bed level OT Short Term Goal 3 - Progress (Week 4): Progressing toward goal  Skilled Therapeutic Interventions/Progress Updates:    1:1 focus on toilet transfers to drop arm commode and problem solving clothing management with decr burden of care. Pt perform scoot transfer bed to BSc with max A- total A (without slide board)- Total A to advance hip back into proper position. Attempted 2 sit to stands with EVA walker with left LE propped up for NWB with max A +2 with decr ability to get trunk fully upright. Transitioned to trial of use of SARA into standing but pt with too strong of posterior push with incr LBP unable to tolerate today. Slide board transfer with max A with 2nd person positioning board.   Therapy Documentation Precautions:  Precautions Precautions: Fall, Cervical Required Braces or Orthoses: Cervical Brace Cervical Brace: Hard collar, At all times Other Brace/Splint: cam boot R LE; cam boot LLE, B resting hand splints for night use Restrictions Weight Bearing Restrictions: Yes RLE Weight Bearing: Weight bearing as tolerated LLE Weight Bearing: Non weight bearing    Pain: Pain Assessment Pain Assessment: 0-10 Pain Score: 8  Pain Type:  Acute pain Pain Location: Back Pain Orientation: Lower Pain Descriptors / Indicators: Aching;Shooting Pain Onset: On-going Pain Intervention(s): Repositioned;Emotional support  See Function Navigator for Current Functional Status.   Therapy/Group: Individual Therapy  Willeen Cass [facility] 06/25/2017, 3:34 PM

## 2017-06-25 NOTE — Progress Notes (Addendum)
Occupational Therapy Session Note  Patient Details  Name: [name] MRN: [id number] Date of Birth: 04/08/1966  Today's Date: 06/25/2017 OT Individual Time: 1450-1505 OT Individual Time Calculation (min): 15 min    Short Term Goals: Week 5:  OT Short Term Goal 1 (Week 5): Pt will roll to R/L in bed with mod A to facilitate LB dressing tasks at bed level OT Short Term Goal 2 (Week 5): Pt will perform lateral leans sitting EOB with mod A to facilitate placement of slide board OT Short Term Goal 3 (Week 5): Pt will maintain dynamic sitting balance EOB with mod A to facilitate UB dressing tasks  Skilled Therapeutic Interventions/Progress Updates:    Pt greeted semi-reclined in bed after recently finishing OT session. Pt reported 8/10 shooting pain in his back and stated he just took muscle relaxer. Pt declined any further activity, but agreeable to bed level UB there-ex as tolerated. B UE elbow flex/ext, hand flex/ext in preparation for functional grasp/release 10 reps x 2 sets. Pt reached max fatigue and falling asleep. Pt left semi-reclined in bed with needs met.   Therapy Documentation Precautions:  Precautions Precautions: Fall, Cervical Required Braces or Orthoses: Cervical Brace Cervical Brace: Hard collar, At all times Other Brace/Splint: cam boot R LE; cam boot LLE, B resting hand splints for night use Restrictions Weight Bearing Restrictions: Yes RLE Weight Bearing: Weight bearing as tolerated LLE Weight Bearing: Non weight bearing General: General OT Amount of Missed Time: 30 Minutes Pain: Pain Assessment Pain Assessment: 0-10 Pain Score: 8  Pain Type: Acute pain Pain Location: Back Pain Orientation: Lower Pain Descriptors / Indicators: Aching;Shooting Pain Onset: On-going Pain Intervention(s): Repositioned;Emotional support   See Function Navigator for Current Functional Status.   Therapy/Group: Individual Therapy  Valma Cava 06/25/2017, 3:29  PM

## 2017-06-25 NOTE — Progress Notes (Signed)
Speech Language Pathology Daily Session Note  Patient Details  Name: [name] MRN: [id number] Date of Birth: 11/14/1965  Today's Date: 06/25/2017 SLP Individual Time: 1448-1856 SLP Individual Time Calculation (min): 42 min  Short Term Goals: Week 5: SLP Short Term Goal 1 (Week 5): Pt will identify at least 2 new deficits and their impact on his functional independence with Min assist verbal cues.   SLP Short Term Goal 2 (Week 5): Pt will verbalize at least 2 safety precautions with min assist verbal cues.   SLP Short Term Goal 3 (Week 5): Pt will consume dysphagia 2 with minimal s/s of aspiration and supervision cues for compesnatory swallow strategies.  SLP Short Term Goal 4 (Week 5): Pt will consume trials of dsyphagia 3 with complete oral clearing and minimal s/s of aspiration to demonstrate readiness for diet upgrade once MD allows.   Skilled Therapeutic Interventions: Skilled ST services focused on swallow and cognition skills. SLP facilitated PO consumption of Dys 2 and thin liquids via straw during breakfast tray. Pt demonstrated 1 immediate thorat clear following mixing consistencies and demonstrated no further overt s/s aspiration throughout meal. Pt declined trials of Dys 3 due to difficulty "keeping food down when I eat too much." Pt demonstrated ability to direct care during PO consumption, intake rate, selection of items and positioning at Mod I level. Pt demonstrated recall of safety precaution while in the parallel bars, which he stated he will be on again today in PT with supervision question cues. Pt was left in room with nurse tech. Recommend to continue ST services.     Function:  Eating Eating   Modified Consistency Diet: Yes Eating Assist Level: Helper feeds patient     Helper Scoops Food on Utensil: Every scoop Helper Brings Food to Mouth: Every scoop   Cognition Comprehension Comprehension assist level: Follows complex conversation/direction with  extra time/assistive device  Expression   Expression assist level: Expresses complex ideas: With extra time/assistive device  Social Interaction Social Interaction assist level: Interacts appropriately with others - No medications needed.  Problem Solving Problem solving assist level: Solves basic 90% of the time/requires cueing < 10% of the time  Memory Memory assist level: Recognizes or recalls 90% of the time/requires cueing < 10% of the time    Pain Pain Assessment Pain Assessment: No/denies pain  Therapy/Group: Individual Therapy  Genesys Coggeshall  [facility] 06/25/2017, 8:49 AM

## 2017-06-25 NOTE — Progress Notes (Signed)
PHYSICAL MEDICINE & REHABILITATION     PROGRESS NOTE    Subjective/Complaints: Neck still bothers him. Encouraged that he put some weight on his right foot yesterday  ROS: pt denies nausea, vomiting, diarrhea, cough, shortness of breath or chest pain   Objective: Vital Signs: Blood pressure 99/68, pulse 77, temperature 97.7 F (36.5 C), temperature source Oral, resp. rate 16, height 5\' 6"  (1.676 m), weight 92.9 kg (204 lb 14.4 oz), SpO2 100 %. No results found. No results for input(s): WBC, HGB, HCT, PLT in the last 72 hours. No results for input(s): NA, K, CL, GLUCOSE, BUN, CREATININE, CALCIUM in the last 72 hours.  Invalid input(s): CO CBG (last 3)  No results for input(s): GLUCAP in the last 72 hours.  Wt Readings from Last 3 Encounters:  06/25/17 92.9 kg (204 lb 14.4 oz)  05/21/17 99 kg (218 lb 3.2 oz)  01/11/17 91.6 kg (202 lb)    Physical Exam:  Constitutional: He appears well-developedand well-nourished. He is in no distress.  HENT:  Head: Normocephalicand atraumatic.   Eyes:  Unable to move right eye laterally. Left eye limited to lateral field and medial fields--- eye movements unchanged. Sclera  remains injected today Neck:  Phonation good  Immobilized by Aspen collar which appears to be fitting appropriately Cardiovascular: RRR without murmur. No JVD                Respiratory: CTA Bilaterally without wheezes or rales. Normal effort        GI: abdomen non-distended. PEG site nearly closed . Musculoskeletal: He exhibits continued    left thigh/leg wounds all dry and healing.  Neurological:    Motor 1-2/5 UE distal UE's. Minimal shoulder movement. Ongoing Trace to absent movement left ankle.   Senses pain stim in all 4.    Left III, VI nerve weakness . Right VI deficit-no changes in neuro exam Skin: Skin is warmand dry.  Wounds all dry/healing.   Psychiatric: pleasant   Assessment/Plan: 1. Functional deficits, tetraplegia secondary to  TBI/cervical SCI which require 3+ hours per day of interdisciplinary therapy in a comprehensive inpatient rehab setting. Physiatrist is providing close team supervision and 24 hour management of active medical problems listed below. Physiatrist and rehab team continue to assess barriers to discharge/monitor patient progress toward functional and medical goals.  Function:  Bathing Bathing position   Position: Bed  Bathing parts   Body parts bathed by helper: Right arm, Left arm, Chest, Abdomen, Front perineal area, Buttocks, Right upper leg, Left upper leg, Right lower leg, Left lower leg, Back  Bathing assist Assist Level: 2 helpers      Upper Body Dressing/Undressing Upper body dressing   What is the patient wearing?: Hospital gown           Button up shirt - Perfomed by helper: Thread/unthread right sleeve, Thread/unthread left sleeve, Pull shirt around back, Button/unbutton shirt    Upper body assist Assist Level: 2 helpers (Sitting EOB)      Lower Body Dressing/Undressing Lower body dressing   What is the patient wearing?: Hospital Gown       Pants- Performed by helper: Thread/unthread right pants leg, Thread/unthread left pants leg, Pull pants up/down                      Lower body assist Assist for lower body dressing: 2 Helpers      Toileting Toileting Toileting activity did not occur: No continent bowel/bladder event  Toileting steps completed by helper: Adjust clothing prior to toileting, Performs perineal hygiene, Adjust clothing after toileting    Toileting assist Assist level: Two helpers   Transfers Chair/bed transfer   Chair/bed transfer method: Lateral scoot Chair/bed transfer assist level: Total assist (Pt < 25%) Chair/bed transfer assistive device: Sliding board Mechanical lift: Maximove   Locomotion Ambulation Ambulation activity did not occur: Safety/medical Editor, commissioning activity did not occur:  Safety/medical concerns Type: Manual Max wheelchair distance: 15 Assist Level: 2 helpers  Cognition Comprehension Comprehension assist level: Follows complex conversation/direction with extra time/assistive device  Expression Expression assist level: Expresses complex ideas: With extra time/assistive device  Social Interaction Social Interaction assist level: Interacts appropriately with others - No medications needed.  Problem Solving Problem solving assist level: Solves basic 90% of the time/requires cueing < 10% of the time  Memory Memory assist level: Recognizes or recalls 90% of the time/requires cueing < 10% of the time   Medical Problem List and Plan: 1. Functional, cognitive and mobility deficitssecondary to Moderate TBI, C4 spinal cord injury -CIR PT, OT, speech--  -now WBAT RLE in boot   -team conf today 2. DVT Prophylaxis/Anticoagulation: Pharmaceutical: Lovenox 3. Chronic neck pain/Pain Management: On Gabapentin bid   -changed hydrocodone to oxycodone for breakthrough pain  - fentanyl patch  33mcg/hr    -Headaches are primarly related to occipital-cervical fractures  -can tape/pad collar to prevent it from rubbing on neck 4. Mood: LCSW to follow for evaluation and support when appropriate.  5. Neuropsych: This patient is notcapable of making decisions on hisown behalf.  -continue deficits in memory/insight 6. Skin/Wound Care: Air mattress overlay. Maintain adequate nutritional and hydration status.  7. Fluids/Electrolytes/Nutrition: Monitor I/O.    -diet advanced to D2 thins. Intake adequate    9. Left periorbital cellulitis/ CN III/VI palsy:  -continue local care  -follow up per optho  -needs to use drops/ointment 10Left femur fracture s/p IM nail: NWB for another 5-6 weeks from 06/20/17  -peristent left foot drop 11. Left open tibial fracture s/p I and D with ORIF: NWB--CAM boot.  11. Left mandibular fracture s/p CR /tongue laceration:   -wires  removed, screws now all removed  -advanced diet 12. Chemosis left eye: Continue Lacrilube every 3 hours till resolved per opthalmology.  - . optho recommends ongoing consvt mgt   - drops/lubricant left eye 13. Facial cellulitis: keflex was completed  15. Thrombocytosis: Likely reactive.    16. ABLA: hgb stable at 10.5 17. Abnormal LFTs: Question due to shocked liver.  80. H/o of MDD: Was on Trintellix 5 mg and Risperidone 5 mg bid at home--continue to hold for now.   -behavior reasonable at present 19. Urine retention/neurogenic bladder: foley out  -continue  I/O caths as he has no sense of bladder at this point  -attempts to improve emptying would probably only lead to incontinence  Bladder spasm trial pyridium- improving  -re-check urine culture still pending              LOS (Days) 35 A FACE TO FACE EVALUATION WAS PERFORMED  Meredith Staggers, MD 06/25/2017 8:50 AM

## 2017-06-26 ENCOUNTER — Encounter (HOSPITAL_COMMUNITY)
Admission: RE | Disposition: A | Discharge status: Home Health | Payer: Self-pay | Source: Intra-hospital | Attending: Physical Medicine & Rehabilitation

## 2017-06-26 ENCOUNTER — Ambulatory Visit (HOSPITAL_COMMUNITY): Admission: RE | Admit: 2017-06-26 | Payer: Self-pay | Source: Ambulatory Visit | Admitting: Otolaryngology

## 2017-06-26 ENCOUNTER — Inpatient Hospital Stay (HOSPITAL_COMMUNITY): Payer: BC Managed Care – PPO | Admitting: Occupational Therapy

## 2017-06-26 ENCOUNTER — Inpatient Hospital Stay (HOSPITAL_COMMUNITY): Payer: BC Managed Care – PPO | Admitting: Certified Registered Nurse Anesthetist

## 2017-06-26 ENCOUNTER — Inpatient Hospital Stay (HOSPITAL_COMMUNITY): Payer: Self-pay

## 2017-06-26 ENCOUNTER — Inpatient Hospital Stay (HOSPITAL_COMMUNITY): Payer: Self-pay | Admitting: Physical Therapy

## 2017-06-26 HISTORY — PX: MANDIBULAR HARDWARE REMOVAL: SHX5205

## 2017-06-26 LAB — POCT I-STAT 4, (NA,K, GLUC, HGB,HCT)
GLUCOSE: 88 mg/dL (ref 65–99)
HCT: 34 % — ABNORMAL LOW (ref 39.0–52.0)
Hemoglobin: 11.6 g/dL — ABNORMAL LOW (ref 13.0–17.0)
Potassium: 4.9 mmol/L (ref 3.5–5.1)
SODIUM: 138 mmol/L (ref 135–145)

## 2017-06-26 SURGERY — REMOVAL, HARDWARE, MANDIBLE
Anesthesia: General | Site: Mouth

## 2017-06-26 MED ORDER — OXYCODONE HCL 5 MG/5ML PO SOLN: 5.0000 mg | Freq: Once | ORAL | Status: DC | PRN

## 2017-06-26 MED ORDER — LIDOCAINE-EPINEPHRINE 1 %-1:100000 IJ SOLN
INTRAMUSCULAR | Status: DC | PRN
Start: 2017-06-26 — End: 2017-06-26
  Administered 2017-06-26: 2 mL

## 2017-06-26 MED ORDER — PROPOFOL 10 MG/ML IV BOLUS
INTRAVENOUS | Status: AC
  Filled 2017-06-26: qty 20

## 2017-06-26 MED ORDER — LIDOCAINE HCL (CARDIAC) 20 MG/ML IV SOLN
INTRAVENOUS | Status: DC | PRN
  Administered 2017-06-26: 100 mg via INTRAVENOUS

## 2017-06-26 MED ORDER — PROPOFOL 10 MG/ML IV BOLUS
INTRAVENOUS | Status: DC | PRN
  Administered 2017-06-26: 150 mg via INTRAVENOUS

## 2017-06-26 MED ORDER — HYDROMORPHONE HCL 1 MG/ML IJ SOLN: 0.2500 mg | INTRAMUSCULAR | Status: DC | PRN

## 2017-06-26 MED ORDER — CEFAZOLIN SODIUM-DEXTROSE 2-3 GM-%(50ML) IV SOLR
INTRAVENOUS | Status: DC | PRN
  Administered 2017-06-26: 2 g via INTRAVENOUS

## 2017-06-26 MED ORDER — OXYCODONE HCL 5 MG PO TABS: 5.0000 mg | ORAL_TABLET | Freq: Once | ORAL | Status: DC | PRN

## 2017-06-26 MED ORDER — LACTATED RINGERS IV SOLN
INTRAVENOUS | Status: DC | PRN
  Administered 2017-06-26: 08:00:00 via INTRAVENOUS

## 2017-06-26 MED ORDER — PHENYLEPHRINE HCL 10 MG/ML IJ SOLN
INTRAMUSCULAR | Status: DC | PRN
  Administered 2017-06-26 (×2): 80 ug via INTRAVENOUS

## 2017-06-26 MED ORDER — MIDAZOLAM HCL 5 MG/5ML IJ SOLN
INTRAMUSCULAR | Status: DC | PRN
  Administered 2017-06-26: 2 mg via INTRAVENOUS

## 2017-06-26 MED ORDER — MIDAZOLAM HCL 2 MG/2ML IJ SOLN
INTRAMUSCULAR | Status: AC
  Filled 2017-06-26: qty 2

## 2017-06-26 MED ORDER — FENTANYL CITRATE (PF) 250 MCG/5ML IJ SOLN
INTRAMUSCULAR | Status: AC
  Filled 2017-06-26: qty 5

## 2017-06-26 MED ORDER — ONDANSETRON HCL 4 MG/2ML IJ SOLN
INTRAMUSCULAR | Status: DC | PRN
  Administered 2017-06-26: 4 mg via INTRAVENOUS

## 2017-06-26 MED ORDER — LIDOCAINE-EPINEPHRINE 1 %-1:100000 IJ SOLN
INTRAMUSCULAR | Status: AC
  Filled 2017-06-26: qty 1

## 2017-06-26 SURGICAL SUPPLY — 33 items
BLADE SURG 15 STRL LF DISP TIS (BLADE) IMPLANT
BLADE SURG 15 STRL SS (BLADE)
CANISTER SUCT 3000ML PPV (MISCELLANEOUS) ×3 IMPLANT
COVER SURGICAL LIGHT HANDLE (MISCELLANEOUS) ×3 IMPLANT
CRADLE DONUT ADULT HEAD (MISCELLANEOUS) IMPLANT
DECANTER SPIKE VIAL GLASS SM (MISCELLANEOUS) ×3 IMPLANT
DRAPE HALF SHEET 40X57 (DRAPES) ×3 IMPLANT
ELECT COATED BLADE 2.86 ST (ELECTRODE) ×3 IMPLANT
ELECT REM PT RETURN 9FT ADLT (ELECTROSURGICAL) ×3
ELECTRODE REM PT RTRN 9FT ADLT (ELECTROSURGICAL) ×1 IMPLANT
GAUZE SPONGE 4X4 16PLY XRAY LF (GAUZE/BANDAGES/DRESSINGS) IMPLANT
GLOVE BIO SURGEON STRL SZ7.5 (GLOVE) ×3 IMPLANT
GOWN STRL REUS W/ TWL LRG LVL3 (GOWN DISPOSABLE) ×2 IMPLANT
GOWN STRL REUS W/TWL LRG LVL3 (GOWN DISPOSABLE) ×4
KIT BASIN OR (CUSTOM PROCEDURE TRAY) ×3 IMPLANT
KIT ROOM TURNOVER OR (KITS) ×3 IMPLANT
NEEDLE HYPO 25GX1X1/2 BEV (NEEDLE) IMPLANT
NEEDLE PRECISIONGLIDE 27X1.5 (NEEDLE) ×3 IMPLANT
NS IRRIG 1000ML POUR BTL (IV SOLUTION) ×3 IMPLANT
PAD ARMBOARD 7.5X6 YLW CONV (MISCELLANEOUS) ×6 IMPLANT
PENCIL BUTTON HOLSTER BLD 10FT (ELECTRODE) ×3 IMPLANT
SUCTION FRAZIER HANDLE 10FR (MISCELLANEOUS)
SUCTION TUBE FRAZIER 10FR DISP (MISCELLANEOUS) IMPLANT
SUT CHROMIC 3 0 PS 2 (SUTURE) ×3 IMPLANT
SUT CHROMIC 4 0 PS 2 18 (SUTURE) ×3 IMPLANT
SUT MNCRL AB 4-0 PS2 18 (SUTURE) ×6 IMPLANT
SYR CONTROL 10ML LL (SYRINGE) ×3 IMPLANT
TOWEL OR 17X24 6PK STRL BLUE (TOWEL DISPOSABLE) ×6 IMPLANT
TRAY ENT MC OR (CUSTOM PROCEDURE TRAY) ×3 IMPLANT
TUBE CONNECTING 12'X1/4 (SUCTIONS) ×1
TUBE CONNECTING 12X1/4 (SUCTIONS) ×2 IMPLANT
WATER STERILE IRR 1000ML POUR (IV SOLUTION) ×3 IMPLANT
YANKAUER SUCT BULB TIP NO VENT (SUCTIONS) ×3 IMPLANT

## 2017-06-26 NOTE — Progress Notes (Signed)
Speech Language Pathology Daily Session Note  Patient Details  Name: [name] MRN: [id number] Date of Birth: 1966/05/02  Today's Date: 06/26/2017 SLP Individual Time: 1100-1158 SLP Individual Time Calculation (min): 58 min  Short Term Goals: Week 5: SLP Short Term Goal 1 (Week 5): Pt will identify at least 2 new deficits and their impact on his functional independence with Min assist verbal cues.   SLP Short Term Goal 2 (Week 5): Pt will verbalize at least 2 safety precautions with min assist verbal cues.   SLP Short Term Goal 3 (Week 5): Pt will consume dysphagia 2 with minimal s/s of aspiration and supervision cues for compesnatory swallow strategies.  SLP Short Term Goal 4 (Week 5): Pt will consume trials of dsyphagia 3 with complete oral clearing and minimal s/s of aspiration to demonstrate readiness for diet upgrade once MD allows.   Skilled Therapeutic Interventions: Skilled ST services focused on swallow and cognition skills. SLP facilitated PO consumption of Dys 2 and thin liquid via straws no overt s/s aspiration and able to direct care at Mod I level. Pt refused trials of Dys 3 due to recently returning from surgical procedure. Pt demonstrated ability express wants/needs requesting to put on a shirt and was able to direct care / safety precautions with arm and collar at supervision A question cues. Pt was left in room with nurse. Recommend to continue ST services.      Function:  Eating Eating   Modified Consistency Diet: Yes Eating Assist Level: Helper feeds patient     Helper Scoops Food on Utensil: Every scoop Helper Brings Food to Mouth: Every scoop   Cognition Comprehension Comprehension assist level: Follows complex conversation/direction with extra time/assistive device  Expression   Expression assist level: Expresses complex ideas: With extra time/assistive device  Social Interaction Social Interaction assist level: Interacts appropriately with others -  No medications needed.  Problem Solving Problem solving assist level: Solves basic 90% of the time/requires cueing < 10% of the time  Memory Memory assist level: Recognizes or recalls 90% of the time/requires cueing < 10% of the time    Pain Pain Assessment Pain Assessment: No/denies pain  Therapy/Group: Individual Therapy  Alois Mincer  [facility] 06/26/2017, 2:06 PM

## 2017-06-26 NOTE — Progress Notes (Signed)
Occupational Therapy Note  Patient Details  Name: [name] MRN: [id number] Date of Birth: Jun 25, 1966  Today's Date: 06/26/2017 OT Missed Time: 69 Minutes Missed Time Reason: Other (comment);Patient fatigue (dental procedure)  Pt having procedure to remove dental hardware today. Pt very lethargic this session and requests not to do therapy at this time. Pt remains supine in bed with soft call bell in L hand. Pt reports positioning as comfortable at this time.  60 missed minutes of skilled OT intervention.    Darleen Crocker P 06/26/2017, 1:26 PM
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
## 2017-06-26 NOTE — Transfer of Care (Signed)
Immediate Anesthesia Transfer of Care Note  Patient: [name]  Procedure(s) Performed: MANDIBULAR HARDWARE REMOVAL (N/A Mouth)  Patient Location: PACU  Anesthesia Type:General  Level of Consciousness: drowsy  Airway & Oxygen Therapy: Patient Spontanous Breathing and Patient connected to nasal cannula oxygen  Post-op Assessment: Report given to RN and Post -op Vital signs reviewed and stable  Post vital signs: Reviewed and stable  Last Vitals:  Vitals:   06/26/17 0617 06/26/17 0927  BP: 100/61 107/67  Pulse: 75 80  Resp: 16 12  Temp: 37.2 C (!) 36.4 C  SpO2: 100% 95%    Last Pain:  Vitals:   06/26/17 0927  TempSrc:   PainSc: (P) Asleep      Patients Stated Pain Goal: 2 (82/50/03 7048)  Complications: No apparent anesthesia complications
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
## 2017-06-26 NOTE — Progress Notes (Signed)
Physical Therapy Weekly Progress Note  Patient Details  Name: [name] MRN: [id number] Date of Birth: [birth year]  Beginning of progress report period: June 20, 2017 End of progress report period: June 26, 2017  Today's Date: 06/26/2017   Patient has met 2 of 3 short term goals.  Pt making slow progress towards goals with improvements demonstrated in bed mobility, transfers and sit <>stand. Pt cleared to WBAT in RLE and have initiated standing in tilt table, sit <>stand in parallel bars and with eva walker, however requires maxA +2 at this time d/t safety and decreased RLE strength/coordination due to SCI and disuse atrophy. Pt highly motivated by change in weight bearing status, however does occasionally demonstrate poor insight into deficits and decreased safety awareness with attempts/requests to try mobility activities beyond his abilities that are unsafe at this time. Ongoing education regarding SCI rehab and effects of prolonged bedrest. Pt's wife present for one PT session with this therapist; she does not demonstrate the ability to provide the high level of physical care required for this patient, although she is willing to help and learn as much as possible and continues to assist with HEP in the evenings.    Patient continues to demonstrate the following deficits muscle weakness, muscle joint tightness and muscle paralysis, decreased cardiorespiratoy endurance, impaired timing and sequencing, abnormal tone, unbalanced muscle activation and decreased coordination, decreased visual acuity and decreased visual motor skills, decreased awareness, decreased problem solving, decreased safety awareness, decreased memory and delayed processing and decreased sitting balance, decreased standing balance, decreased postural control, decreased balance strategies and difficulty maintaining precautions and therefore will continue to benefit from skilled PT intervention to increase functional  independence with mobility.  Patient progressing toward long term goals..  Continue plan of care.  PT Short Term Goals Week 5:  PT Short Term Goal 1 (Week 5): Pt will demonstrate rolling R/L modA +1 PT Short Term Goal 1 - Progress (Week 5): Progressing toward goal PT Short Term Goal 2 (Week 5): Pt will demonstrate supine >sit with modA using bed features as needed PT Short Term Goal 2 - Progress (Week 5): Not met PT Short Term Goal 3 (Week 5): Pt will demonstrate dynamic sitting balance S x10 min PT Short Term Goal 3 - Progress (Week 5): Met PT Short Term Goal 4 (Week 5): Pt will demonstrate transfer bed <>w/c with modA +1 Week 6:  PT Short Term Goal 1 (Week 6): Pt will demonstrate rolling R/L consistent modA with bed features  PT Short Term Goal 2 (Week 6): Pt will demonstrate supine <>sit with modA PT Short Term Goal 3 (Week 6): Pt will perform sit <>stand modA maintaining NWB LLE  Skilled Therapeutic Interventions/Progress Updates: Pt missed 90 min PT d/t off unit for procedure; upon pt's return to unit pt declining participation as he is still "out of it" from anesthesia and RN needing to perform I&O catheter. Will continue to follow per POC.     Therapy Documentation Precautions:  Precautions Precautions: Fall, Cervical Required Braces or Orthoses: Cervical Brace Cervical Brace: Hard collar, At all times Other Brace/Splint: cam boot R LE; cam boot LLE, B resting hand splints for night use Restrictions Weight Bearing Restrictions: Yes RLE Weight Bearing: Weight bearing as tolerated LLE Weight Bearing: Non weight bearing General: PT Amount of Missed Time (min): 90 Minutes PT Missed Treatment Reason: Other (Comment) (off unit for procedure)   See Function Navigator for Current Functional Status.  Therapy/Group: Individual Therapy  Benjiman Core  Tygielski 06/26/2017, 7:44 AM

## 2017-06-26 NOTE — Op Note (Deleted)
  The note originally documented on this encounter has been moved the the encounter in which it belongs.  
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
## 2017-06-26 NOTE — NC FL2 (Signed)
[place] LEVEL OF CARE SCREENING TOOL     IDENTIFICATION  Patient Name: [name] Birthdate: 05/14/1966 Sex: male Admission Date (Current Location): 05/21/2017  [facility] and [place] Number:  Herbalist and Address:  [facility], [place] [address] [place], [place] [address]      Provider Number: [id number]  Attending Physician Name and Address:  Meredith Staggers, MD  Relative Name and Phone Number:       Current Level of Care: Other (Comment) (Acute Inpatient Rehabilitation) Recommended Level of Care: Skilled Nursing Facility Prior Approval Number:    Date Approved/Denied:   PASRR Number:    Discharge Plan: SNF    Current Diagnoses: Patient Active Problem List   Diagnosis Date Noted  . Moderate episode of recurrent major depressive disorder ([place])   . Diffuse TBI w loss of consciousness of unsp duration, init ([place]) 05/21/2017  . Tetraplegia ([place]) 05/21/2017  . Fracture   . Respiratory failure ([place])   . Trauma   . Polysubstance abuse ([place])   . PEG (percutaneous endoscopic gastrostomy) status ([place])   . Fever   . Tachycardia   . Post-operative pain   . Leukocytosis   . SIRS (systemic inflammatory response syndrome) (H[facility])   . Acute blood loss anemia   . TBI (traumatic brain injury) ([place]) 05/03/2017  . Severe episode of recurrent major depressive disorder, without psychotic features ([place]) 01/12/2017  . Polysubstance abuse ([place]) 01/12/2017  . Substance induced mood disorder ([place]) 01/12/2017    Orientation RESPIRATION BLADDER Height & Weight     Self, Time, Situation, Place  Normal Incontinent Weight: 93.1 kg (205 lb 5.5 oz) Height:  5\' 6"  (167.6 cm)  BEHAVIORAL SYMPTOMS/MOOD NEUROLOGICAL BOWEL NUTRITION STATUS      Incontinent Diet (dys 2 with thin liquids - supervision)  AMBULATORY STATUS COMMUNICATION OF NEEDS Skin   Total Care Verbally Normal                       Personal Care Assistance Level  of Assistance  Bathing, Feeding, Dressing, Total care Bathing Assistance: Maximum assistance Feeding assistance: Maximum assistance Dressing Assistance: Maximum assistance Total Care Assistance: Maximum assistance   Functional Limitations Info             SPECIAL CARE FACTORS FREQUENCY  PT (By licensed PT), OT (By licensed OT), Speech therapy     PT Frequency: 5x/wk OT Frequency: 5x/wk     Speech Therapy Frequency: 5x/wk      Contractures Contractures Info: Not present    Additional Factors Info  Code Status, Allergies, Psychotropic Code Status Info: full Allergies Info: NKDA Psychotropic Info: see MAR         Current Medications (06/26/2017):  This is the current hospital active medication list Current Facility-Administered Medications  Medication Dose Route Frequency Provider Last Rate Last Dose  . acetaminophen (TYLENOL) solution 325-650 mg  325-650 mg Oral Q4H PRN Meredith Staggers, MD   650 mg at 06/15/17 0754  . alum & mag hydroxide-simeth (MAALOX/MYLANTA) 200-200-20 MG/5ML suspension 30 mL  30 mL Oral Q4H PRN Bary Leriche, PA-C   30 mL at 06/17/17 0446  . artificial tears (LACRILUBE) ophthalmic ointment   Left Eye Q4H PRN Meredith Staggers, MD      . benzocaine (ORAJEL) 10 % mucosal gel   Mouth/Throat TID PRN Love, Pamela S, PA-C      . bisacodyl (DULCOLAX) suppository 10 mg  10  mg Rectal Daily PRN Bary Leriche, PA-C   10 mg at 06/15/17 1010  . bisacodyl (DULCOLAX) suppository 10 mg  10 mg Rectal Daily Meredith Staggers, MD   10 mg at 06/18/17 2308  . chlorhexidine (PERIDEX) 0.12 % solution 15 mL  15 mL Mouth/Throat BID Marletta Lor, MD   15 mL at 06/25/17 1007  . clonazePAM (KLONOPIN) disintegrating tablet 0.5 mg  0.5 mg Oral QHS Love, Pamela S, PA-C   0.5 mg at 06/25/17 2104  . enoxaparin (LOVENOX) injection 40 mg  40 mg Subcutaneous Q24H Love, Pamela S, PA-C   40 mg at 06/25/17 1021  . feeding supplement (ENSURE ENLIVE) (ENSURE ENLIVE) liquid 237 mL   237 mL Oral TID WC Meredith Staggers, MD   237 mL at 06/25/17 1227  . feeding supplement (PRO-STAT SUGAR FREE 64) liquid 30 mL  30 mL Oral BID Meredith Staggers, MD   30 mL at 06/25/17 1010  . fentaNYL (DURAGESIC - dosed mcg/hr) patch 25 mcg  25 mcg Transdermal Q72H Meredith Staggers, MD   25 mcg at 06/25/17 1009  . gabapentin (NEURONTIN) capsule 400 mg  400 mg Oral TID Bary Leriche, PA-C   400 mg at 06/25/17 2056  . guaiFENesin-dextromethorphan (ROBITUSSIN DM) 100-10 MG/5ML syrup 5-10 mL  5-10 mL Oral Q6H PRN Love, Pamela S, PA-C      . hydrOXYzine (ATARAX/VISTARIL) tablet 25 mg  25 mg Oral Daily PRN Love, Ivan Anchors, PA-C      . Influenza vac split quadrivalent PF (FLUARIX) injection 0.5 mL  0.5 mL Intramuscular Prior to discharge Love, Pamela S, PA-C      . lidocaine (LIDODERM) 5 % 1 patch  1 patch Transdermal Q24H Bary Leriche, PA-C   1 patch at 06/26/17 0557  . MEDLINE mouth rinse  15 mL Mouth Rinse QID Meredith Staggers, MD   15 mL at 06/25/17 2303  . methocarbamol (ROBAXIN) tablet 500 mg  500 mg Oral QID Bary Leriche, PA-C   500 mg at 06/25/17 2055  . multivitamin with minerals tablet 1 tablet  1 tablet Oral Daily Rumbarger, Valeda Malm, RPH   1 tablet at 06/25/17 1007  . naphazoline-glycerin (CLEAR EYES) ophth solution 2 drop  2 drop Both Eyes TID WC & HS Meredith Staggers, MD   2 drop at 06/25/17 2303  . ondansetron (ZOFRAN-ODT) disintegrating tablet 4 mg  4 mg Oral Q6H PRN Love, Pamela S, PA-C       Or  . ondansetron (ZOFRAN) injection 4 mg  4 mg Intravenous Q6H PRN Love, Pamela S, PA-C      . oxyCODONE (Oxy IR/ROXICODONE) immediate release tablet 5 mg  5 mg Oral Q6H PRN Meredith Staggers, MD   5 mg at 06/25/17 2105  . pantoprazole (PROTONIX) EC tablet 40 mg  40 mg Oral Daily Rumbarger, Valeda Malm, RPH   40 mg at 06/25/17 1007  . phenazopyridine (PYRIDIUM) tablet 100 mg  100 mg Oral TID WC Kirsteins, Luanna Salk, MD   100 mg at 06/25/17 1811  . polyethylene glycol (MIRALAX / GLYCOLAX) packet  17 g  17 g Oral Daily Bary Leriche, PA-C   17 g at 06/25/17 1012  . prochlorperazine (COMPAZINE) tablet 5-10 mg  5-10 mg Oral Q6H PRN Love, Pamela S, PA-C       Or  . prochlorperazine (COMPAZINE) injection 5-10 mg  5-10 mg Intramuscular Q6H PRN Love, Ivan Anchors, PA-C  Or  . prochlorperazine (COMPAZINE) suppository 12.5 mg  12.5 mg Rectal Q6H PRN Love, Pamela S, PA-C      . QUEtiapine (SEROQUEL) tablet 50 mg  50 mg Oral BID Love, Pamela S, PA-C   50 mg at 06/25/17 2105  . saccharomyces boulardii (FLORASTOR) capsule 250 mg  250 mg Oral BID Bary Leriche, PA-C   250 mg at 06/25/17 2056  . sodium phosphate (FLEET) 7-19 GM/118ML enema 1 enema  1 enema Rectal Once PRN Love, Pamela S, PA-C      . traMADol Veatrice Bourbon) tablet 50 mg  50 mg Oral Q6H Love, Pamela S, PA-C   50 mg at 06/26/17 0549  . traZODone (DESYREL) tablet 25-50 mg  25-50 mg Oral QHS PRN Bary Leriche, PA-C   50 mg at 06/25/17 2105  . venlafaxine [facility]) tablet 75 mg  75 mg Oral BID WC Love, Pamela S, PA-C   75 mg at 06/25/17 6301     Discharge Medications: Please see discharge summary for a list of discharge medications.  Relevant Imaging Results:  Relevant Lab Results:   Additional Information SS# [id number]  Lennart Pall, LCSW

## 2017-06-26 NOTE — Anesthesia Preprocedure Evaluation (Addendum)
Anesthesia Evaluation  Patient identified by MRN, date of birth, ID band Patient unresponsive    Reviewed: Allergy & Precautions, H&P , NPO status , Patient's Chart, lab work & pertinent test results  Airway Mallampati: III     Mouth opening: Limited Mouth Opening  Dental no notable dental hx.    Pulmonary  Hypoxic respiratory failure   Pulmonary exam normal breath sounds clear to auscultation       Cardiovascular negative cardio ROS Normal cardiovascular exam Rhythm:Regular Rate:Normal     Neuro/Psych TBI quadraparesis  Neuromuscular disease negative psych ROS   GI/Hepatic negative GI ROS, Neg liver ROS,   Endo/Other  negative endocrine ROS  Renal/GU negative Renal ROS     Musculoskeletal negative musculoskeletal ROS (+)   Abdominal   Peds  Hematology  (+) anemia ,   Anesthesia Other Findings   Reproductive/Obstetrics                            Anesthesia Physical Anesthesia Plan  ASA: III  Anesthesia Plan: General   Post-op Pain Management:    Induction: Intravenous  PONV Risk Score and Plan:   Airway Management Planned: LMA  Additional Equipment:   Intra-op Plan:   Post-operative Plan: Extubation in OR  Informed Consent: I have reviewed the patients History and Physical, chart, labs and discussed the procedure including the risks, benefits and alternatives for the proposed anesthesia with the patient or authorized representative who has indicated his/her understanding and acceptance.   Dental advisory given  Plan Discussed with: CRNA  Anesthesia Plan Comments:        Anesthesia Quick Evaluation

## 2017-06-26 NOTE — Anesthesia Procedure Notes (Signed)
Procedure Name: LMA Insertion Date/Time: 06/26/2017 8:56 AM Performed by: Bryson Corona Pre-anesthesia Checklist: Patient identified, Emergency Drugs available, Suction available and Patient being monitored Patient Re-evaluated:Patient Re-evaluated prior to induction Oxygen Delivery Method: Circle system utilized Preoxygenation: Pre-oxygenation with 100% oxygen Induction Type: IV induction LMA: LMA inserted LMA Size: 4.0 Number of attempts: 1 Tube secured with: Tape

## 2017-06-26 NOTE — Plan of Care (Signed)
Problem: RH Bed to Chair Transfers Goal: LTG Patient will perform bed/chair transfers w/assist (PT) LTG: Patient will perform bed/chair transfers with assistance, with/without cues (PT).  Upgraded d/t progress and change in WB status

## 2017-06-26 NOTE — Brief Op Note (Signed)
05/21/2017 - 06/26/2017  9:14 AM  PATIENT:  [name]  52 y.o. male  PRE-OPERATIVE DIAGNOSIS:  MANDIBLE FRACTURE  POST-OPERATIVE DIAGNOSIS:  MANDIBLE FRACTURE  PROCEDURE:  Procedure(s): MANDIBULAR HARDWARE REMOVAL (N/A)  SURGEON:  Surgeon(s) and Role:    Melida Quitter, MD - Primary  PHYSICIAN ASSISTANT:   ASSISTANTS: none   ANESTHESIA:   general  EBL:  Minimal   BLOOD ADMINISTERED:none  DRAINS: none   LOCAL MEDICATIONS USED:  LIDOCAINE   SPECIMEN:  No Specimen  DISPOSITION OF SPECIMEN:  N/A  COUNTS:  YES  TOURNIQUET:  * No tourniquets in log *  DICTATION: .Other Dictation: Dictation Number [id number]  PLAN OF CARE: Back to rehab from PACU  PATIENT DISPOSITION:  PACU - hemodynamically stable.   Delay start of Pharmacological VTE agent (>24hrs) due to surgical blood loss or risk of bleeding: no

## 2017-06-26 NOTE — Anesthesia Postprocedure Evaluation (Signed)
Anesthesia Post Note  Patient: Jacai Kipp  Procedure(s) Performed: MANDIBULAR HARDWARE REMOVAL (N/A Mouth)     Patient location during evaluation: PACU Anesthesia Type: General Level of consciousness: awake and sedated Pain management: pain level controlled Vital Signs Assessment: post-procedure vital signs reviewed and stable Respiratory status: spontaneous breathing, nonlabored ventilation, respiratory function stable and patient connected to nasal cannula oxygen Cardiovascular status: blood pressure returned to baseline and stable Postop Assessment: no apparent nausea or vomiting Anesthetic complications: no    Last Vitals:  Vitals:   06/26/17 0942 06/26/17 0956  BP: 122/79 119/80  Pulse: 74 74  Resp: 12 12  Temp:  (!) 36.4 C  SpO2: 100% 100%    Last Pain:  Vitals:   06/26/17 0956  TempSrc:   PainSc: 0-No pain                 Vincent Ehrler,JAMES TERRILL

## 2017-06-26 NOTE — Progress Notes (Signed)
[place] PHYSICAL MEDICINE & REHABILITATION     PROGRESS NOTE    Subjective/Complaints: No new complaints. Going down for hardware removal toda  ROS: pt denies nausea, vomiting, diarrhea, cough, shortness of breath or chest pain   Objective: Vital Signs: Blood pressure 119/80, pulse 74, temperature (!) 97.5 F (36.4 C), resp. rate 12, height 5\' 6"  (1.676 m), weight 93.1 kg (205 lb 5.5 oz), SpO2 100 %. No results found.  Recent Labs  06/26/17 0819  HGB 11.6*  HCT 34.0*    Recent Labs  06/26/17 0819  NA 138  K 4.9  GLUCOSE 88   CBG (last 3)  No results for input(s): GLUCAP in the last 72 hours.  Wt Readings from Last 3 Encounters:  06/26/17 93.1 kg (205 lb 5.5 oz)  05/21/17 99 kg (218 lb 3.2 oz)  01/11/17 91.6 kg (202 lb)    Physical Exam:  Constitutional: He appears well-developedand well-nourished. He is in no distress.  HENT:  Head: Normocephalicand atraumatic.   Eyes:  Unable to move right eye laterally. Left eye limited to lateral field and medial fields--- eye movements unchanged. Sclera  remains injected today Neck:  Phonation good  Immobilized by Aspen collar which appears to be fitting appropriately Cardiovascular: RRR without murmur. No JVD                 Respiratory: CTA Bilaterally without wheezes or rales. Normal effort        GI: abdomen non-distended. PEG site nearly closed . Musculoskeletal: He exhibits continued    left thigh/leg wounds all dry and healing.  Neurological:    Motor 1-2/5 UE distal UE's. Minimal shoulder movement. Ongoing Trace to absent movement left ankle.   Senses pain stim in all 4.    Left III, VI nerve weakness . Right VI deficit-no changes in neuro exam Skin: Skin is warmand dry.  Wounds all dry/healing.   Psychiatric: pleasant   Assessment/Plan: 1. Functional deficits, tetraplegia secondary to TBI/cervical SCI which require 3+ hours per day of interdisciplinary therapy in a comprehensive inpatient rehab  setting. Physiatrist is providing close team supervision and 24 hour management of active medical problems listed below. Physiatrist and rehab team continue to assess barriers to discharge/monitor patient progress toward functional and medical goals.  Function:  Bathing Bathing position   Position: Bed  Bathing parts   Body parts bathed by helper: Right arm, Left arm, Chest, Abdomen, Front perineal area, Buttocks, Right upper leg, Left upper leg, Right lower leg, Left lower leg, Back  Bathing assist Assist Level: 2 helpers      Upper Body Dressing/Undressing Upper body dressing   What is the patient wearing?: Hospital gown           Button up shirt - Perfomed by helper: Thread/unthread right sleeve, Thread/unthread left sleeve, Pull shirt around back, Button/unbutton shirt    Upper body assist Assist Level: 2 helpers (Sitting EOB)      Lower Body Dressing/Undressing Lower body dressing   What is the patient wearing?: Hospital Gown       Pants- Performed by helper: Thread/unthread right pants leg, Thread/unthread left pants leg, Pull pants up/down                      Lower body assist Assist for lower body dressing: 2 Helpers      Toileting Toileting Toileting activity did not occur: No continent bowel/bladder event   Toileting steps completed by helper: Adjust clothing prior  to toileting, Performs perineal hygiene, Adjust clothing after toileting    Toileting assist Assist level:  (totalA)   Transfers Chair/bed transfer   Chair/bed transfer method: Lateral scoot Chair/bed transfer assist level: Maximal assist (Pt 25 - 49%/lift and lower) Chair/bed transfer assistive device: Sliding board, Armrests Mechanical lift: Maximove   Locomotion Ambulation Ambulation activity did not occur: Safety/medical Editor, commissioning activity did not occur: Safety/medical concerns Type: Manual Max wheelchair distance: 15 Assist Level: 2 helpers   Cognition Comprehension Comprehension assist level: Follows complex conversation/direction with extra time/assistive device  Expression Expression assist level: Expresses complex ideas: With extra time/assistive device  Social Interaction Social Interaction assist level: Interacts appropriately with others - No medications needed.  Problem Solving Problem solving assist level: Solves basic 90% of the time/requires cueing < 10% of the time  Memory Memory assist level: Recognizes or recalls 90% of the time/requires cueing < 10% of the time   Medical Problem List and Plan: 1. Functional, cognitive and mobility deficitssecondary to Moderate TBI, C4 spinal cord injury -CIR PT, OT, speech--  -now WBAT RLE in boot   -SNF pending 2. DVT Prophylaxis/Anticoagulation: Pharmaceutical: Lovenox 3. Chronic neck pain/Pain Management: On Gabapentin bid   -changed hydrocodone to oxycodone for breakthrough pain  - fentanyl patch  20mcg/hr    -Headaches are primarly related to occipital-cervical fractures  -can tape/pad collar to prevent it from rubbing on neck 4. Mood: LCSW to follow for evaluation and support when appropriate.  5. Neuropsych: This patient is notcapable of making decisions on hisown behalf.  -continue deficits in memory/insight 6. Skin/Wound Care: Air mattress overlay. Maintain adequate nutritional and hydration status.  7. Fluids/Electrolytes/Nutrition: Monitor I/O.    -diet advanced to D2 thins. Intake adequate    9. Left periorbital cellulitis/ CN III/VI palsy:  -continue local care  -follow up per optho  -needs to use drops/ointment 10Left femur fracture s/p IM nail: NWB for another 5-6 weeks from 06/20/17  -peristent left foot drop 11. Left open tibial fracture s/p I and D with ORIF: NWB--CAM boot.  11. Left mandibular fracture s/p CR /tongue laceration:   -wires removed, hardware completely removed today  -advanced diet 12. Chemosis left eye: Continue  Lacrilube every 3 hours till resolved per opthalmology.  - . optho recommends ongoing consvt mgt   - drops/lubricant left eye 13. Facial cellulitis: keflex was completed  15. Thrombocytosis: Likely reactive.    16. ABLA: hgb stable at 10.5 17. Abnormal LFTs: Question due to shocked liver.  63. H/o of MDD: Was on Trintellix 5 mg and Risperidone 5 mg bid at home--continue to hold for now.   -behavior reasonable at present 19. Urine retention/neurogenic bladder: foley out  -continue  I/O caths as he has no sense of bladder at this point  -attempts to improve emptying would probably only lead to incontinence  Bladder spasm trial pyridium- improving--dc  -re-check urine culture negative              LOS (Days) 36 A FACE TO FACE EVALUATION WAS PERFORMED  Meredith Staggers, MD 06/26/2017 12:10 PM

## 2017-06-27 ENCOUNTER — Inpatient Hospital Stay (HOSPITAL_COMMUNITY): Payer: Self-pay | Admitting: Occupational Therapy

## 2017-06-27 ENCOUNTER — Inpatient Hospital Stay (HOSPITAL_COMMUNITY): Payer: BC Managed Care – PPO | Admitting: Occupational Therapy

## 2017-06-27 ENCOUNTER — Inpatient Hospital Stay (HOSPITAL_COMMUNITY): Payer: BC Managed Care – PPO | Admitting: Speech Pathology

## 2017-06-27 ENCOUNTER — Inpatient Hospital Stay (HOSPITAL_COMMUNITY): Payer: BC Managed Care – PPO

## 2017-06-27 ENCOUNTER — Encounter (HOSPITAL_COMMUNITY): Payer: Self-pay | Admitting: Otolaryngology

## 2017-06-27 NOTE — Progress Notes (Signed)
Occupational Therapy Session Note  Patient Details  Name: [name] MRN: [id number] Date of Birth: 05/18/1966  Today's Date: 06/27/2017 OT Individual Time: 1104-1204 OT Individual Time Calculation (min): 60 min   Short Term Goals: Week 5:  OT Short Term Goal 1 (Week 5): Pt will roll to R/L in bed with mod A to facilitate LB dressing tasks at bed level OT Short Term Goal 2 (Week 5): Pt will perform lateral leans sitting EOB with mod A to facilitate placement of slide board OT Short Term Goal 3 (Week 5): Pt will maintain dynamic sitting balance EOB with mod A to facilitate UB dressing tasks  Skilled Therapeutic Interventions/Progress Updates:    OT treatment session focused on transfer training, sit<>stand, and B UE strength/coordination. Pt completed slideboard transfer to therapy mat with improved power from R side to assist with transfer- although still needed Mod/Max A +2. Demonstrated to pt and discussed body mechanics of anterior weight shift with sit<>stand. Pt completed 5 sit<>stands using EVA walker with Max + 2 assist. Pt needed 1 person to help maintain NWB LLE with LLE placed outside of EVA walker frame. 2nd person assist with knee block and boost from behind. Once up off of mat, OT facilitated hip and trunk extension with pt able to hold posture for 5-10 seconds before returning to sit. Extended rest breaks between each stand. B UE strength/coordination with large peg board task. Pt able to achieve grasp on pegs then reach ~80 degrees to place onto peg board placed on raised table. L UE able to achieve with increased time, but guided A needed for R UE. Pt placed 4 pegs total. Slideboard transfers back to wc, then to bed with Max+2. Pt left semi-reclined in bed with needs met and pancake button in hand.   Therapy Documentation Precautions:  Precautions Precautions: Fall, Cervical Required Braces or Orthoses: Cervical Brace Cervical Brace: Hard collar, At all times Other  Brace/Splint: cam boot R LE; cam boot LLE, B resting hand splints for night use Restrictions Weight Bearing Restrictions: Yes RLE Weight Bearing: Weight bearing as tolerated LLE Weight Bearing: Non weight bearing Pain: Pain Assessment Pain Assessment: 0-10 Pain Score: 8  Pain Type: Acute pain Pain Location: Back Pain Descriptors / Indicators: Aching Pain Onset: On-going Pain Intervention(s): Repositioned  See Function Navigator for Current Functional Status.   Therapy/Group: Individual Therapy   S  06/27/2017, 12:36 PM  

## 2017-06-27 NOTE — Progress Notes (Signed)
Speech Language Pathology Daily Session Note  Patient Details  Name: [name] MRN: [id number] Date of Birth: 1966/05/11  Today's Date: 06/27/2017 SLP Individual Time: 1000-1030 SLP Individual Time Calculation (min): 30 min  Short Term Goals: Week 5: SLP Short Term Goal 1 (Week 5): Pt will identify at least 2 new deficits and their impact on his functional independence with Min assist verbal cues.   SLP Short Term Goal 2 (Week 5): Pt will verbalize at least 2 safety precautions with min assist verbal cues.   SLP Short Term Goal 3 (Week 5): Pt will consume dysphagia 2 with minimal s/s of aspiration and supervision cues for compesnatory swallow strategies.  SLP Short Term Goal 4 (Week 5): Pt will consume trials of dsyphagia 3 with complete oral clearing and minimal s/s of aspiration to demonstrate readiness for diet upgrade once MD allows.   Skilled Therapeutic Interventions: Skilled treatment session focused on dysphagia and cognition goals. SLP facilitated session by providing skilled observation of pt consuming graham crackers with peanut butter. Pt with strained voice and throat clears throughout consumption - however despite Max A cues, pt continues to try and speak to instruct SLP during consumption or rationalize overt s/s of aspiration during consumption. When pt isn't speaking during consumption, these s/s decrease. Pt unable to demonstrate understanding or awareness. Recommend trial tray of dysphagia 3/regular before upgrading. Education provided and pt returned to room. Continue per current plan of care.      Function:  Eating Eating   Modified Consistency Diet: No Eating Assist Level: Helper feeds patient     Helper Scoops Food on Utensil: Every scoop Helper Brings Food to Mouth: Every scoop   Cognition Comprehension Comprehension assist level: Follows complex conversation/direction with extra time/assistive device  Expression   Expression assist level: Expresses  complex ideas: With extra time/assistive device  Social Interaction Social Interaction assist level: Interacts appropriately with others - No medications needed.  Problem Solving Problem solving assist level: Solves basic 90% of the time/requires cueing < 10% of the time  Memory Memory assist level: More than reasonable amount of time    Pain    Therapy/Group: Individual Therapy  [name] 06/27/2017, 11:15 AM

## 2017-06-27 NOTE — Progress Notes (Signed)
[facility] PHYSICAL MEDICINE & REHABILITATION     PROGRESS NOTE    Subjective/Complaints: Happy to have all hardware out. Mouth "sore". Denies any new issues. Anxious to put weight on left leg  ROS: pt denies nausea, vomiting, diarrhea, cough, shortness of breath or chest pain   Objective: Vital Signs: Blood pressure 111/73, pulse 83, temperature 98.7 F (37.1 C), temperature source Oral, resp. rate 16, height 5\' 6"  (1.676 m), weight 92.5 kg (204 lb), SpO2 98 %. No results found.  Recent Labs  06/26/17 0819  HGB 11.6*  HCT 34.0*    Recent Labs  06/26/17 0819  NA 138  K 4.9  GLUCOSE 88   CBG (last 3)  No results for input(s): GLUCAP in the last 72 hours.  Wt Readings from Last 3 Encounters:  06/27/17 92.5 kg (204 lb)  05/21/17 99 kg (218 lb 3.2 oz)  01/11/17 91.6 kg (202 lb)    Physical Exam:  Constitutional: He appears well-developedand well-nourished. He is in no distress.  HENT:  Head: Normocephalicand atraumatic.   Eyes:  Unable to move right eye laterally. Left eye limited to lateral field and medial fields--- eye movements unchanged. Sclera red/stable Neck:  Phonation good  Immobilized by Aspen collar which appears to be fitting appropriately Cardiovascular: RRR without murmur. No JVD                  Respiratory: CTA Bilaterally without wheezes or rales. Normal effort         GI: abdomen non-distended. PEG site nearly closed . Musculoskeletal: He exhibits continued    left thigh/leg wounds all dry and healing.  Neurological:    Motor  2 to 2+ prox to distal UE's. . Ongoing Trace to absent movement left ankle.   Senses pain stim in all 4.    Left III, VI nerve weakness . Right VI deficit-no changes in neuro exam Skin: Skin is warmand dry.  Wounds all dry/healing.   Psychiatric: pleasant, sometimes anxious   Assessment/Plan: 1. Functional deficits, tetraplegia secondary to TBI/cervical SCI which require 3+ hours per day of interdisciplinary therapy  in a comprehensive inpatient rehab setting. Physiatrist is providing close team supervision and 24 hour management of active medical problems listed below. Physiatrist and rehab team continue to assess barriers to discharge/monitor patient progress toward functional and medical goals.  Function:  Bathing Bathing position   Position: Bed  Bathing parts   Body parts bathed by helper: Right arm, Left arm, Chest, Abdomen, Front perineal area, Buttocks, Right upper leg, Left upper leg, Right lower leg, Left lower leg, Back  Bathing assist Assist Level: 2 helpers      Upper Body Dressing/Undressing Upper body dressing   What is the patient wearing?: Hospital gown           Button up shirt - Perfomed by helper: Thread/unthread right sleeve, Thread/unthread left sleeve, Pull shirt around back, Button/unbutton shirt    Upper body assist Assist Level: 2 helpers (Sitting EOB)      Lower Body Dressing/Undressing Lower body dressing   What is the patient wearing?: Hospital Gown       Pants- Performed by helper: Thread/unthread right pants leg, Thread/unthread left pants leg, Pull pants up/down                      Lower body assist Assist for lower body dressing: 2 Helpers      Toileting Toileting Toileting activity did not occur: No continent bowel/bladder  event   Toileting steps completed by helper: Adjust clothing prior to toileting, Performs perineal hygiene, Adjust clothing after toileting    Toileting assist Assist level:  (totalA)   Transfers Chair/bed transfer   Chair/bed transfer method: Lateral scoot Chair/bed transfer assist level: Maximal assist (Pt 25 - 49%/lift and lower) Chair/bed transfer assistive device: Sliding board, Armrests Mechanical lift: Maximove   Locomotion Ambulation Ambulation activity did not occur: Safety/medical Editor, commissioning activity did not occur: Safety/medical concerns Type: Manual Max wheelchair  distance: 15 Assist Level: 2 helpers  Cognition Comprehension Comprehension assist level: Follows complex conversation/direction with extra time/assistive device  Expression Expression assist level: Expresses complex ideas: With extra time/assistive device  Social Interaction Social Interaction assist level: Interacts appropriately with others with medication or extra time (anti-anxiety, antidepressant).  Problem Solving Problem solving assist level: Solves basic 90% of the time/requires cueing < 10% of the time  Memory Memory assist level: Recognizes or recalls 90% of the time/requires cueing < 10% of the time   Medical Problem List and Plan: 1. Functional, cognitive and mobility deficitssecondary to Moderate TBI, C4 spinal cord injury -CIR PT, OT, speech--  -now WBAT RLE in boot --attempting some wb with therapies  -SNF pending 2. DVT Prophylaxis/Anticoagulation: Pharmaceutical: Lovenox 3. Chronic neck pain/Pain Management: On Gabapentin bid   -changed hydrocodone to oxycodone for breakthrough pain  - fentanyl patch  8mcg/hr    -Headaches are primarly related to occipital-cervical fractures  -can tape/pad collar to prevent it from rubbing on neck 4. Mood: LCSW to follow for evaluation and support when appropriate.  5. Neuropsych: This patient is notcapable of making decisions on hisown behalf.  -continue deficits in memory/insight 6. Skin/Wound Care: Air mattress overlay. Maintain adequate nutritional and hydration status.  7. Fluids/Electrolytes/Nutrition: Monitor I/O.    -diet advanced to D2 thins. Intake adequate    9. Left periorbital cellulitis/ CN III/VI palsy:  -continue local care  -follow up per optho  -needs to use drops/ointment 10Left femur fracture s/p IM nail: NWB for another 5-6 weeks from 06/20/17  -peristent left foot drop 11. Left open tibial fracture s/p I and D with ORIF: NWB--CAM boot.  11. Left mandibular fracture s/p CR /tongue  laceration:   -wires removed, hardware completely removed today  -advance diet as tolerated 12. Chemosis left eye: Continue Lacrilube every 3 hours till resolved per opthalmology.  - . optho recommends ongoing consvt mgt   - drops/lubricant left eye 13. Facial cellulitis: keflex was completed  15. Thrombocytosis: Likely reactive.    16. ABLA: hgb stable at 10.5 17. Abnormal LFTs: Question due to shocked liver.  21. H/o of MDD: Was on Trintellix 5 mg and Risperidone 5 mg bid at home--continue to hold for now.   -behavior reasonable at present 19. Urine retention/neurogenic bladder: foley out  -continue  I/O caths as he has no sense of bladder at this point  -attempts to improve emptying would probably only lead to incontinence  -bladder spasms better with pyridium---now stopped  -re-check urine culture negative              LOS (Days) 37 A FACE TO FACE EVALUATION WAS PERFORMED  Meredith Staggers, MD 06/27/2017 9:25 AM

## 2017-06-27 NOTE — Progress Notes (Signed)
Occupational Therapy Session Note  Patient Details  Name: [name] MRN: [id number] Date of Birth: July 19, 1966  Today's Date: 06/27/2017 OT Individual Time: 0045-9977 OT Individual Time Calculation (min): 47 min    Short Term Goals: Week 5:  OT Short Term Goal 1 (Week 5): Pt will roll to R/L in bed with mod A to facilitate LB dressing tasks at bed level OT Short Term Goal 2 (Week 5): Pt will perform lateral leans sitting EOB with mod A to facilitate placement of slide board OT Short Term Goal 3 (Week 5): Pt will maintain dynamic sitting balance EOB with mod A to facilitate UB dressing tasks  Skilled Therapeutic Interventions/Progress Updates:    Pt semi-supine in bed upon OT arrival finishing lunch (total A from NA).  Pt transitioned to EOB with total A.  Max A x 2 SB transfer into tilt in space w/c.  Pt able to reposition self when tilted back and pushing through RLE.  Pt reported increased pain and fatigue this day and he presented with flattened affect in comparison with last encounter.  Pt participated in functional bimanual activity of folding washcloths, pt completed x2 with increased time and Min A at R elbow for proximal assist.  Pt then participated in pegboard task with RUE, requiring Mod-max A to facilitate proximal control at R elbow.  Frequent rest breaks req'd today.  Music utilized as a therapeutic intervention, however pt did not seem to respond as well today.  Pt assisted back to room and left seated in w/c, tilted back for pressure relief, with slap-pad in reach.  Therapy Documentation Precautions:  Precautions Precautions: Fall, Cervical Required Braces or Orthoses: Cervical Brace Cervical Brace: Hard collar, At all times Other Brace/Splint: cam boot R LE; cam boot LLE, B resting hand splints for night use Restrictions Weight Bearing Restrictions: Yes RLE Weight Bearing: Weight bearing as tolerated LLE Weight Bearing: Non weight bearing General: General OT  Amount of Missed Time: 13 Minutes Pain: Pain Assessment Pain Assessment: 0-10 Pain Score: 8  Pain Type: Acute pain Pain Location: Back Pain Orientation: Posterior Pain Descriptors / Indicators: Aching Pain Onset: On-going Pain Intervention(s): Repositioned  See Function Navigator for Current Functional Status.   Therapy/Group: Individual Therapy  Marcella Dubs 06/27/2017, 2:14 PM

## 2017-06-27 NOTE — Progress Notes (Signed)
Physical Therapy Note  Patient Details  Name: Waris Rodger MRN: [id number] Date of Birth: [birth year] Today's Date: 06/27/2017  0900-1000, 60 min individual tx Pain: 6/10 cervical spine, premedicated  Bed mobility with HOB raised, mod/max assist to L, assist for bil LE movement and elevating trunk.  Slide board transfer bed (deflated) > w/c to R with +2 assist , with pt assisting with RLE wt bearing and slight elevation of hips.  Slide board to mat table in gym.  Sit> stand x 3 to EVA, with L foot laterally away from walker to prevent wt bearing. Pt unable to full extend R knee.     In sitting, with feet supported, dynamic sitting activity of wt shifting L><R, and L partial lateral leans.    Pt left resting in w/c with quick release belt applied and all needs within reach.  See function navigator for current status.   Citlali Gautney 06/27/2017, 9:24 AM

## 2017-06-27 NOTE — Progress Notes (Signed)
Occupational Therapy Session Note  Patient Details  Name: [name] MRN: [id number] Date of Birth: [birth year]  Today's Date: 06/27/2017 OT Individual Time: 7096-2836 OT Individual Time Calculation (min): 10 min    Short Term Goals: Week 5:  OT Short Term Goal 1 (Week 5): Pt will roll to R/L in bed with mod A to facilitate LB dressing tasks at bed level OT Short Term Goal 2 (Week 5): Pt will perform lateral leans sitting EOB with mod A to facilitate placement of slide board OT Short Term Goal 3 (Week 5): Pt will maintain dynamic sitting balance EOB with mod A to facilitate UB dressing tasks  Skilled Therapeutic Interventions/Progress Updates:    Upon entering the room, pt sleeping in bed with his wife present in the room. She reports, " He just feel asleep. Please let him rest." OT educated caregiver on patient progress and answered questions regarding therapy process. She continues to decline for pt at this time. Missed OT intervention secondary to patient fatigue.  Therapy Documentation Precautions:  Precautions Precautions: Fall, Cervical Required Braces or Orthoses: Cervical Brace Cervical Brace: Hard collar, At all times Other Brace/Splint: cam boot R LE; cam boot LLE, B resting hand splints for night use Restrictions Weight Bearing Restrictions: Yes RLE Weight Bearing: Weight bearing as tolerated LLE Weight Bearing: Non weight bearing General: General OT Amount of Missed Time: 20 Minutes Vital Signs: Therapy Vitals Temp: 97.9 F (36.6 C) Temp Source: Oral Pulse Rate: 85 Resp: 20 BP: 110/71 Patient Position (if appropriate): Sitting Oxygen Therapy SpO2: 98 % O2 Device: Not Delivered Pain: Pain Assessment Pain Assessment: 0-10 Pain Score: 8  Pain Type: Acute pain Pain Location: Back Pain Orientation: Posterior Pain Descriptors / Indicators: Aching Pain Onset: On-going Pain Intervention(s): Repositioned  See Function Navigator for Current  Functional Status.   Therapy/Group: Individual Therapy  Gypsy Decant 06/27/2017, 4:16 PM

## 2017-06-28 ENCOUNTER — Inpatient Hospital Stay (HOSPITAL_COMMUNITY): Payer: Self-pay

## 2017-06-28 ENCOUNTER — Inpatient Hospital Stay (HOSPITAL_COMMUNITY): Payer: Self-pay | Admitting: Occupational Therapy

## 2017-06-28 ENCOUNTER — Inpatient Hospital Stay (HOSPITAL_COMMUNITY): Payer: BC Managed Care – PPO | Admitting: Speech Pathology

## 2017-06-28 ENCOUNTER — Encounter (HOSPITAL_COMMUNITY): Payer: Self-pay

## 2017-06-28 ENCOUNTER — Inpatient Hospital Stay (HOSPITAL_COMMUNITY): Payer: Self-pay | Admitting: Physical Therapy

## 2017-06-28 MED ORDER — LIDOCAINE 5 % EX PTCH
2.0000 | MEDICATED_PATCH | CUTANEOUS | Status: DC
  Administered 2017-06-29 – 2017-07-19 (×21): 2 via TRANSDERMAL
  Filled 2017-06-28 (×22): qty 2

## 2017-06-28 NOTE — Progress Notes (Signed)
Physical Therapy Session Note  Patient Details  Name: [name] MRN: [id number] Date of Birth: [birth year]  Today's Date: 06/28/2017 PT Individual Time: 3888-7579 PT Individual Time Calculation (min): 10 min   Short Term Goals: Week 6:  PT Short Term Goal 1 (Week 6): Pt will demonstrate rolling R/L consistent modA with bed features  PT Short Term Goal 2 (Week 6): Pt will demonstrate supine <>sit with modA PT Short Term Goal 3 (Week 6): Pt will perform sit <>stand modA maintaining NWB LLE  Skilled Therapeutic Interventions/Progress Updates:   Pt refusing OOB today and refusing therapeutic interventions. Education and strong encouragement provided about goals, progression, importance of OOB (especially in relation to diet upgrade), and functional participation. Pt continues to decline and states 'I am just done. I quit". Emotional support provided but pt unwilling.   Therapy Documentation Precautions:  Precautions Precautions: Fall, Cervical Required Braces or Orthoses: Cervical Brace Cervical Brace: Hard collar, At all times Other Brace/Splint: cam boot R LE; cam boot LLE, B resting hand splints for night use Restrictions Weight Bearing Restrictions: Yes RLE Weight Bearing: Weight bearing as tolerated LLE Weight Bearing: Non weight bearing General: PT Amount of Missed Time (min): 50 Minutes PT Missed Treatment Reason: Patient unwilling to participate  Pain: C/o pain in back and all over. Premedicated.   See Function Navigator for Current Functional Status.   Therapy/Group: Individual Therapy  Canary Brim Ivory Broad, PT, DPT  06/28/2017, 2:56 PM

## 2017-06-28 NOTE — Progress Notes (Signed)
Physical Therapy Session Note  Patient Details  Name: [name] MRN: [id number] Date of Birth: 08/06/1966  Today's Date: 06/28/2017 PT Individual Time: 1530-1540 PT Individual Time Calculation (min): 10 min   Short Term Goals: Week 6:  PT Short Term Goal 1 (Week 6): Pt will demonstrate rolling R/L consistent modA with bed features  PT Short Term Goal 2 (Week 6): Pt will demonstrate supine <>sit with modA PT Short Term Goal 3 (Week 6): Pt will perform sit <>stand modA maintaining NWB LLE  Skilled Therapeutic Interventions/Progress Updates:   Pt supine in bed, and refused therapy due to pain and lack of motivation. PT assisted pt with repositioning in bed with max assist for pain management. Pt left supine in bed with call bell in reach and all needs met.      Therapy Documentation Precautions:  Precautions Precautions: Fall, Cervical Required Braces or Orthoses: Cervical Brace Cervical Brace: Hard collar, At all times Other Brace/Splint: cam boot R LE; cam boot LLE, B resting hand splints for night use Restrictions Weight Bearing Restrictions: Yes RLE Weight Bearing: Weight bearing as tolerated LLE Weight Bearing: Non weight bearing General: PT Amount of Missed Time (min): 50 Minutes PT Missed Treatment Reason: Patient unwilling to participate Vital Signs: Therapy Vitals Temp: 97.8 F (36.6 C) Temp Source: Oral Pulse Rate: 89 Resp: 18 BP: 113/73 Patient Position (if appropriate): Lying Oxygen Therapy SpO2: 99 % O2 Device: Not Delivered Pain: Pain Assessment Pain Assessment: 0-10 Pain Score: Asleep   See Function Navigator for Current Functional Status.   Therapy/Group: Individual Therapy  Lorie Phenix 06/28/2017, 5:32 PM

## 2017-06-28 NOTE — Progress Notes (Signed)
Occupational Therapy Session Note  Patient Details  Name: [name] MRN: [id number] Date of Birth: 06-20-1966  Today's Date: 06/28/2017 OT Individual Time: 0830-0900 OT Individual Time Calculation (min): 30 min    Short Term Goals: Week 1:  OT Short Term Goal 1 (Week 1): Pt will engage in static sitting for 3 minutes with  mod A or less for sitting balance.  OT Short Term Goal 1 - Progress (Week 1): Progressing toward goal OT Short Term Goal 2 (Week 1): Pt verbalize NWB precautions with 100% accuracy for 3 consecutive sessions.  OT Short Term Goal 2 - Progress (Week 1): Met Week 2:  OT Short Term Goal 1 (Week 2): Pt will engage in static sitting for 3 minutes with  mod A or less for sitting balance.  OT Short Term Goal 1 - Progress (Week 2): Met OT Short Term Goal 2 (Week 2): Pt will direct care for bed mobility in preparation for supine>sit EOB with mod quesitoning verbal cues OT Short Term Goal 2 - Progress (Week 2): Met OT Short Term Goal 3 (Week 2): Pt will recall correct placement of slide board with mod verbal cues OT Short Term Goal 3 - Progress (Week 2): Met Week 3:  OT Short Term Goal 1 (Week 3): Pt will maintain a functional grasp during 1 grooming task for 1 minute OT Short Term Goal 1 - Progress (Week 3): Progressing toward goal OT Short Term Goal 2 (Week 3): Pt will complete 5% of lifting pants over upper legs during dressing without physical assist from helper OT Short Term Goal 2 - Progress (Week 3): Met OT Short Term Goal 3 (Week 3): Pt will complete 1 ADL task EOB for 4 minutes to improve upright tolerance  OT Short Term Goal 3 - Progress (Week 3): Met Week 4:  OT Short Term Goal 1 (Week 4): Pt will maintain a functional grasp with max Aduring 1 grooming task for 1 minute OT Short Term Goal 1 - Progress (Week 4): Met OT Short Term Goal 2 (Week 4): Pt will perform slide board transfer to drop arm BSC with max A OT Short Term Goal 2 - Progress (Week 4):  Met OT Short Term Goal 3 (Week 4): Pt will roll to R/L in bed with mod A to facilitate LB dressing tasks at bed level OT Short Term Goal 3 - Progress (Week 4): Progressing toward goal  Skilled Therapeutic Interventions/Progress Updates:    1:1 Focus on bed mobility during self care. Pt partipated in dressing with goal of decreasing burden of care and giving instruction to caregiver. PT able to roll with max A both to the right and to the left. PT able to help lift both legs for clothing to be threaded. Total A for Ub dressing. Pt's wrap doff on right LE and washed and rewrapped ankle for support during trials of standing today.  Left LE continues to be swollen. MD oked wrapping for swelling/ edema to the knee.  Pt left in bed to rest.   Returned to fabricate new neck brace chin pads.   Therapy Documentation Precautions:  Precautions Precautions: Fall, Cervical Required Braces or Orthoses: Cervical Brace Cervical Brace: Hard collar, At all times Other Brace/Splint: cam boot R LE; cam boot LLE, B resting hand splints for night use Restrictions Weight Bearing Restrictions: Yes RLE Weight Bearing: Weight bearing as tolerated LLE Weight Bearing: Non weight bearing Pain: No c/o pain in session  Other Treatments:    See  Function Navigator for Current Functional Status.   Therapy/Group: Individual Therapy  Willeen Cass [facility] 06/28/2017, 10:17 AM

## 2017-06-28 NOTE — Progress Notes (Signed)
[place] PHYSICAL MEDICINE & REHABILITATION     PROGRESS NOTE    Subjective/Complaints: Neck sore, lidoderm patches help shoulder pain/neck pain. Disappointed that diet wasn't further upgraded.   ROS: pt denies nausea, vomiting, diarrhea, cough, shortness of breath or chest pain   Objective: Vital Signs: Blood pressure 118/68, pulse 80, temperature 98.9 F (37.2 C), temperature source Oral, resp. rate 18, height 5\' 6"  (1.676 m), weight 93.1 kg (205 lb 5.6 oz), SpO2 98 %. No results found.  Recent Labs  06/26/17 0819  HGB 11.6*  HCT 34.0*    Recent Labs  06/26/17 0819  NA 138  K 4.9  GLUCOSE 88   CBG (last 3)  No results for input(s): GLUCAP in the last 72 hours.  Wt Readings from Last 3 Encounters:  06/28/17 93.1 kg (205 lb 5.6 oz)  05/21/17 99 kg (218 lb 3.2 oz)  01/11/17 91.6 kg (202 lb)    Physical Exam:  Constitutional: He appears well-developedand well-nourished. He is in no distress.  HENT:  Head: Normocephalicand atraumatic.   Eyes:  Unable to move right eye laterally. Left eye limited to lateral field and medial fields--- eye movements unchanged. Sclera red unchanged Neck:  Phonation good  Immobilized by Aspen collar which appears to be fitting appropriately Cardiovascular: RRR without murmur. No JVD                    Respiratory: CTA Bilaterally without wheezes or rales. Normal effort           GI: abdomen non-distended. PEG site nearly closed . Musculoskeletal: mild swelling LLE. RLE in boot  Neurological:    Motor  2 to 2+ prox to distal UE's. . Ongoing Trace to absent movement left ankle.   Senses pain stim in all 4.    Left III, VI nerve weakness . Right VI deficit-no changes in neuro exam Skin: Skin is warmand dry.  Wounds all dry/healing.   Psychiatric: pleasant, sometimes anxious   Assessment/Plan: 1. Functional deficits, tetraplegia secondary to TBI/cervical SCI which require 3+ hours per day of interdisciplinary therapy in a  comprehensive inpatient rehab setting. Physiatrist is providing close team supervision and 24 hour management of active medical problems listed below. Physiatrist and rehab team continue to assess barriers to discharge/monitor patient progress toward functional and medical goals.  Function:  Bathing Bathing position   Position: Bed  Bathing parts   Body parts bathed by helper: Right arm, Left arm, Chest, Abdomen, Front perineal area, Buttocks, Right upper leg, Left upper leg, Right lower leg, Left lower leg, Back  Bathing assist Assist Level: 2 helpers      Upper Body Dressing/Undressing Upper body dressing   What is the patient wearing?: Hospital gown           Button up shirt - Perfomed by helper: Thread/unthread right sleeve, Thread/unthread left sleeve, Pull shirt around back, Button/unbutton shirt    Upper body assist Assist Level: 2 helpers      Lower Body Dressing/Undressing Lower body dressing   What is the patient wearing?: Hospital Gown       Pants- Performed by helper: Thread/unthread right pants leg, Thread/unthread left pants leg, Pull pants up/down                      Lower body assist Assist for lower body dressing: 2 Helpers      Toileting Toileting Toileting activity did not occur: No continent bowel/bladder event   Toileting  steps completed by helper: Adjust clothing prior to toileting, Performs perineal hygiene, Adjust clothing after toileting    Toileting assist Assist level:  (totalA)   Transfers Chair/bed transfer Chair/bed transfer activity did not occur: N/A Chair/bed transfer method: Lateral scoot Chair/bed transfer assist level: 2 helpers Chair/bed transfer assistive device: Sliding board, Armrests Mechanical lift: Maximove   Locomotion Ambulation Ambulation activity did not occur: Safety/medical Editor, commissioning activity did not occur: Safety/medical concerns Type: Manual Max wheelchair distance:  15 Assist Level: 2 helpers  Cognition Comprehension Comprehension assist level: Follows complex conversation/direction with extra time/assistive device  Expression Expression assist level: Expresses complex ideas: With extra time/assistive device  Social Interaction Social Interaction assist level: Interacts appropriately with others with medication or extra time (anti-anxiety, antidepressant).  Problem Solving Problem solving assist level: Solves complex 90% of the time/cues < 10% of the time  Memory Memory assist level: Recognizes or recalls 90% of the time/requires cueing < 10% of the time   Medical Problem List and Plan: 1. Functional, cognitive and mobility deficitssecondary to Moderate TBI, C4 spinal cord injury -CIR PT, OT, speech--  -now WBAT RLE in boot --attempting some wb with therapies  -SNF pending 2. DVT Prophylaxis/Anticoagulation: Pharmaceutical: Lovenox 3. Chronic neck pain/Pain Management: On Gabapentin bid   -changed hydrocodone to oxycodone for breakthrough pain  - fentanyl patch  18mcg/hr    -Headaches are primarly related to occipital-cervical fractures  -can tape/pad collar to prevent it from rubbing on neck 4. Mood: LCSW to follow for evaluation and support when appropriate.  5. Neuropsych: This patient is notcapable of making decisions on hisown behalf.  -continue deficits in memory/insight 6. Skin/Wound Care: Air mattress overlay. Maintain adequate nutritional and hydration status.  7. Fluids/Electrolytes/Nutrition: Monitor I/O.    -diet currently D2 thins. Advance per SLP    9. Left periorbital cellulitis/ CN III/VI palsy:  -continue local care  -follow up per optho  -needs to use drops/ointment 10Left femur fracture s/p IM nail: NWB for another 5-6 weeks from 06/20/17  -peristent left foot drop 11. Left open tibial fracture s/p I and D with ORIF: NWB--CAM boot.  11. Left mandibular fracture s/p CR /tongue laceration:   -wires removed,  hardware completely removed today  -advance diet as tolerated 12. Chemosis left eye: Continue Lacrilube every 3 hours till resolved per opthalmology.  - . optho recommends ongoing consvt mgt   - drops/lubricant left eye 13. Facial cellulitis: keflex was completed  15. Thrombocytosis: Likely reactive.    16. ABLA: hgb stable at 10.5 17. Abnormal LFTs: Question due to shocked liver.  22. H/o of MDD: Was on Trintellix 5 mg and Risperidone 5 mg bid at home--continue to hold for now.   -behavior reasonable at present 19. Urine retention/neurogenic bladder: foley out  -continue  I/O caths as he has no sense of bladder at this point  -attempts to improve emptying would probably only lead to incontinence                LOS (Days) 38 A FACE TO FACE EVALUATION WAS PERFORMED  Meredith Staggers, MD 06/28/2017 8:42 AM

## 2017-06-28 NOTE — Plan of Care (Signed)
Problem: SCI BOWEL ELIMINATION Goal: RH STG MANAGE BOWEL WITH ASSISTANCE STG Manage Bowel with total Assistance.    Outcome: Progressing Pt requires total A with bowels.  Problem: SCI BLADDER ELIMINATION Goal: RH STG MANAGE BLADDER WITH EQUIPMENT WITH ASSISTANCE STG Manage Bladder With Equipment With total Assistance    Outcome: Progressing I&O cath q 5-6 hours. Pt unable to void  Problem: RH SKIN INTEGRITY Goal: RH STG SKIN FREE OF INFECTION/BREAKDOWN Skin to remain free from infection and breakdown with total assist.   Outcome: Progressing No skin breakdown. Goal: RH STG ABLE TO PERFORM INCISION/WOUND CARE W/ASSISTANCE STG Able To Perform Incision/Wound Care With total Assistance.   Outcome: Not Applicable Date Met: 40/98/28 Pt does not have any wound or incision that requires his teaching to care for, at this time  Problem: RH PAIN MANAGEMENT Goal: RH STG PAIN MANAGED AT OR BELOW PT'S PAIN GOAL 5 or less  Outcome: Progressing Pt has pain at pain goal at times, relief with medication.

## 2017-06-28 NOTE — Plan of Care (Signed)
Problem: SCI BOWEL ELIMINATION Goal: RH STG MANAGE BOWEL WITH ASSISTANCE STG Manage Bowel with total Assistance.    Outcome: Progressing  06/28/17 1616  Bowel Management Goals  STG: Pt will manage bowels with assistance 1-Total assistance    Problem: SCI BLADDER ELIMINATION Goal: RH STG MANAGE BLADDER WITH EQUIPMENT WITH ASSISTANCE STG Manage Bladder With Equipment With total Assistance    Outcome: Progressing  06/28/17 1616  Bladder Management Goals  STG: Pt will manage bladder with equipment with assistance 1-Total assistance    Problem: RH SAFETY Goal: RH STG ADHERE TO SAFETY PRECAUTIONS W/ASSISTANCE/DEVICE STG Adhere to Safety Precautions With mod Assistance/Device.   Outcome: Progressing  06/28/17 1616  Safety Goals  STG:Pt will adhere to safety precautions with assistance/device 3-Moderate assistance    Problem: SCI BLADDER ELIMINATION Goal: RH STG MANAGE BLADDER WITH EQUIPMENT WITH ASSISTANCE STG Manage Bladder With Equipment With mod Assistance  Outcome: Progressing  06/28/17 1616  Bladder Management Goals  STG: Pt will manage bladder with equipment with assistance 1-Total assistance

## 2017-06-28 NOTE — Progress Notes (Addendum)
Occupational Therapy Session Note  Patient Details  Name: [name] MRN: [id number] Date of Birth: August 20, 1966  Today's Date: 06/28/2017 OT Individual Time: 6283-6629 OT Individual Time Calculation (min): 68 min   Skilled Therapeutic Interventions/Progress Updates:    Tx focus on sitting balance and UE NMR during self care tasks.   Pt greeted supine in bed with NT present, requesting to eat breakfast. Agreeable to complete self feeding EOB. Supine<sit with 2 helpers and pt able to maintain static sitting balance with min guard. Issued him built up utensils and worked on UE gross Sports administrator by bringing utensil to mouth (with therapist assist). Pt with min-mod spillage. Visibly discouraged, repeating "I can't do this." Provided him support and encouragement, suggested eating in w/c for back support, however pt adamant that he wanted NT to feed him in bed. Pt returned to supine with assist. Openly discussed his decreased affect. Pt reporting "I just feel depressed and want to be left alone." Pt provided therapeutic listening, support, and additional encouragement to participate in tx, talked about the progress he's made at CIR thus far. Pt finally agreeable to eat EOB with OT assisting completely with self feeding. Pt requiring Mod-Max A for sitting balance with max cues for anterior weight shifting. Pt proceeding to report "I can't do it." He returned to supine with 2 assist. He inquired about talking to his psychologist again to improve his psychosocial health. Addressed this with SW. At end of tx pt assisted with boosting himself up in bed (with R LE). 2 helpers for repositioning for safety/comfort. Pt left with RN at time of departure.   Therapy Documentation Precautions:  Precautions Precautions: Fall, Cervical Required Braces or Orthoses: Cervical Brace Cervical Brace: Hard collar, At all times Other Brace/Splint: cam boot R LE; cam boot LLE, B resting hand splints for night  use Restrictions Weight Bearing Restrictions: Yes RLE Weight Bearing: Weight bearing as tolerated LLE Weight Bearing: Non weight bearing   Pain: Pt c/o neck pain. RN made aware and administered medication during session    ADL:  :    See Function Navigator for Current Functional Status.  Therapy/Group: Individual Therapy  [name] A [name] 06/28/2017, 12:24 PM

## 2017-06-28 NOTE — Progress Notes (Signed)
Speech Language Pathology Weekly Progress and Session Note  Patient Details  Name: [name] MRN: [id number] Date of Birth: Sep 16, 1965  Beginning of progress report period: June 21, 2017 End of progress report period: June 28, 2017  Today's Date: 06/28/2017 SLP Individual Time: 1130-1140 SLP Individual Time Calculation (min): 10 min  Short Term Goals: Week 5: SLP Short Term Goal 1 (Week 5): Pt will identify at least 2 new deficits and their impact on his functional independence with Min assist verbal cues.   SLP Short Term Goal 1 - Progress (Week 5): Met SLP Short Term Goal 2 (Week 5): Pt will verbalize at least 2 safety precautions with min assist verbal cues.   SLP Short Term Goal 2 - Progress (Week 5): Met SLP Short Term Goal 3 (Week 5): Pt will consume dysphagia 2 with minimal s/s of aspiration and supervision cues for compesnatory swallow strategies.  SLP Short Term Goal 3 - Progress (Week 5): Met SLP Short Term Goal 4 (Week 5): Pt will consume trials of dsyphagia 3 with complete oral clearing and minimal s/s of aspiration to demonstrate readiness for diet upgrade once MD allows.  SLP Short Term Goal 4 - Progress (Week 5): Not met    New Short Term Goals: Week 6: SLP Short Term Goal 1 (Week 6): Pt will consume trials of dsyphagia 3 with complete oral clearing and minimal s/s of aspiration to demonstrate readiness for diet upgrade once MD allows.  SLP Short Term Goal 2 (Week 6): Given a problematic situation, pt will provide appropriate solution iwth supervision cues and 90% accuracy.   Weekly Progress Updates: Pt will progress this reporting period and has met 3 out of 4 STGs. Pt continues to require skilled ST for further diet advancement since having hardware removed form mouth. Pt would also benefit from more cognitive opportunities to problem solve situations. Anticipate that pt will require SNF for follow up therapy.      Intensity: Minumum of 1-2 x/day, 30  to 90 minutes Frequency: 3 to 5 out of 7 days Duration/Length of Stay: 1 week Treatment/Interventions: Cognitive remediation/compensation;Cueing hierarchy;Dysphagia/aspiration precaution training;Functional tasks;Patient/family education;Internal/external aids;Speech/Language facilitation;Environmental controls;Therapeutic Activities   Daily Session  Skilled Therapeutic Interventions: Skilled treatment session focused on education with pt regarding trial tray of regular diet textures. Pt was reclined in pt and states that he is unable to repositioned in bed or be out of bed d/t pain. However he agrees that he just received pain medication and is overall frustrated. Nursing also provides that pt was frustrated this morning by nursing encouragement for pt to feed himself. He stated to SLP "just leave me on dysphagia 2 and close the door." SLP advised nursing of pt's choice for this session.      Function:     Cognition Comprehension Comprehension assist level: Follows complex conversation/direction with extra time/assistive device  Expression   Expression assist level: Expresses complex ideas: With extra time/assistive device  Social Interaction Social Interaction assist level: Interacts appropriately with others with medication or extra time (anti-anxiety, antidepressant).  Problem Solving Problem solving assist level: Solves complex 90% of the time/cues < 10% of the time  Memory Memory assist level: Recognizes or recalls 90% of the time/requires cueing < 10% of the time   General    Pain    Therapy/Group: Individual Therapy  Jena Tegeler 06/28/2017, 11:39 AM
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
## 2017-06-29 ENCOUNTER — Inpatient Hospital Stay (HOSPITAL_COMMUNITY): Payer: Self-pay | Admitting: Occupational Therapy

## 2017-06-29 ENCOUNTER — Inpatient Hospital Stay (HOSPITAL_COMMUNITY): Payer: BC Managed Care – PPO

## 2017-06-29 DIAGNOSIS — T148XXA Other injury of unspecified body region, initial encounter: Secondary | ICD-10-CM

## 2017-06-29 DIAGNOSIS — T1490XA Injury, unspecified, initial encounter: Secondary | ICD-10-CM

## 2017-06-29 DIAGNOSIS — G894 Chronic pain syndrome: Secondary | ICD-10-CM

## 2017-06-29 DIAGNOSIS — M542 Cervicalgia: Secondary | ICD-10-CM

## 2017-06-29 DIAGNOSIS — R740 Nonspecific elevation of levels of transaminase and lactic acid dehydrogenase [LDH]: Secondary | ICD-10-CM

## 2017-06-29 DIAGNOSIS — R7401 Elevation of levels of liver transaminase levels: Secondary | ICD-10-CM

## 2017-06-29 NOTE — Progress Notes (Signed)
[place] PHYSICAL MEDICINE & REHABILITATION     PROGRESS NOTE    Subjective/Complaints: Patient seen lying in bed this morning. He complains of neck pain. History of little unclear, but patient appears to indicate that he hurt his neck in therapies either Thursday or Friday in therapies.   ROS: + Neck pain. Denies nausea, vomiting, diarrhea, shortness of breath or chest pain   Objective: Vital Signs: Blood pressure 132/82, pulse 85, temperature 97.7 F (36.5 C), temperature source Oral, resp. rate 18, height 5\' 6"  (1.676 m), weight 93.1 kg (205 lb 5.6 oz), SpO2 98 %. Dg Cervical Spine 1 View  Result Date: 06/29/2017 CLINICAL DATA:  Neck pain following injury. History of occipital-C3 fusion. EXAM: CERVICAL SPINE 1 VIEW COMPARISON:  05/10/2017 operative films. FINDINGS: Posterior fusion from the occiput to C3 again noted without definite complicating features. No acute fracture subluxation noted. Mild multilevel degenerative disc disease and spondylosis in the mid and lower cervical spine noted. IMPRESSION: No evidence of acute abnormality. Occipital-C3 fusion again noted. Electronically Signed   By: Margarette Canada M.D.   On: 06/29/2017 14:59   No results for input(s): WBC, HGB, HCT, PLT in the last 72 hours. No results for input(s): NA, K, CL, GLUCOSE, BUN, CREATININE, CALCIUM in the last 72 hours.  Invalid input(s): CO CBG (last 3)  No results for input(s): GLUCAP in the last 72 hours.  Wt Readings from Last 3 Encounters:  06/28/17 93.1 kg (205 lb 5.6 oz)  05/21/17 99 kg (218 lb 3.2 oz)  01/11/17 91.6 kg (202 lb)    Physical Exam:  Constitutional: He appears well-developed. + Distressed HENT: Normocephalic. Atraumatic. Eyes: Unable to move right eye laterally.  Neck:  Immobilized by Aspen collar  Cardiovascular: RRR without murmur. No JVD                    Respiratory: CTA Bilaterally. Normal effort           GI: abdomen non-distended. PEG site healing Musculoskeletal: mild  b/l LE edema Neurological:  Alert Motor  2/5 prox to distal B/l UE's.  Left III, VI nerve weakness .  Right VI deficit Skin: Skin is warmand dry.  Wounds all dry/healing.   Psychiatric: Anxious  Assessment/Plan: 1. Functional deficits, tetraplegia secondary to TBI/cervical SCI which require 3+ hours per day of interdisciplinary therapy in a comprehensive inpatient rehab setting. Physiatrist is providing close team supervision and 24 hour management of active medical problems listed below. Physiatrist and rehab team continue to assess barriers to discharge/monitor patient progress toward functional and medical goals.  Function:  Bathing Bathing position   Position: Bed  Bathing parts   Body parts bathed by helper: Right arm, Left arm, Chest, Abdomen, Front perineal area, Buttocks, Right upper leg, Left upper leg, Right lower leg, Left lower leg, Back  Bathing assist Assist Level: 2 helpers      Upper Body Dressing/Undressing Upper body dressing   What is the patient wearing?: Hospital gown           Button up shirt - Perfomed by helper: Thread/unthread right sleeve, Thread/unthread left sleeve, Pull shirt around back, Button/unbutton shirt    Upper body assist Assist Level: 2 helpers      Lower Body Dressing/Undressing Lower body dressing   What is the patient wearing?: Hospital Gown       Pants- Performed by helper: Thread/unthread right pants leg, Thread/unthread left pants leg, Pull pants up/down  Lower body assist Assist for lower body dressing: 2 Helpers      Toileting Toileting Toileting activity did not occur: No continent bowel/bladder event   Toileting steps completed by helper: Adjust clothing prior to toileting, Performs perineal hygiene, Adjust clothing after toileting    Toileting assist Assist level:  (totalA)   Transfers Chair/bed transfer Chair/bed transfer activity did not occur: N/A Chair/bed transfer method:  Lateral scoot Chair/bed transfer assist level: 2 helpers Chair/bed transfer assistive device: Sliding board, Armrests Mechanical lift: Maximove   Locomotion Ambulation Ambulation activity did not occur: Safety/medical Editor, commissioning activity did not occur: Safety/medical concerns Type: Manual Max wheelchair distance: 15 Assist Level: 2 helpers  Cognition Comprehension Comprehension assist level: Follows complex conversation/direction with extra time/assistive device  Expression Expression assist level: Expresses complex ideas: With extra time/assistive device  Social Interaction Social Interaction assist level: Interacts appropriately with others with medication or extra time (anti-anxiety, antidepressant).  Problem Solving Problem solving assist level: Solves complex 90% of the time/cues < 10% of the time  Memory Memory assist level: Recognizes or recalls 90% of the time/requires cueing < 10% of the time   Medical Problem List and Plan: 1. Functional, cognitive and mobility deficitssecondary to Moderate TBI, C4 spinal cord injury -Cont CIR   -now WBAT RLE in boot --attempting some wb with therapies  -SNF pending  -Cervical x-ray ordered due to increase in neck pain. 2. DVT Prophylaxis/Anticoagulation: Pharmaceutical: Lovenox 3. Chronic neck pain/Pain Management: On Gabapentin bid   -changed hydrocodone to oxycodone for breakthrough pain  - fentanyl patch  63mcg/hr    -Headaches are primarly related to occipital-cervical fractures  -can tape/pad collar to prevent it from rubbing on neck 4. Mood: LCSW to follow for evaluation and support when appropriate.  5. Neuropsych: This patient is notcapable of making decisions on hisown behalf.  -continue deficits in memory/insight 6. Skin/Wound Care: Air mattress overlay. Maintain adequate nutritional and hydration status.  7. Fluids/Electrolytes/Nutrition: Monitor I/O.    -diet currently D2 thins.  Advance per SLP 9. Left periorbital cellulitis/ CN III/VI palsy:  -continue local care  -follow up per optho  -needs to use drops/ointment 10Left femur fracture s/p IM nail: NWB for another 5-6 weeks from 06/20/17  -peristent left foot drop 11. Left open tibial fracture s/p I and D with ORIF: NWB--CAM boot.  11. Left mandibular fracture s/p CR /tongue laceration:   -wires removed, hardware completely removed  -advance diet as tolerated 12. Chemosis left eye: Continue Lacrilube every 3 hours till resolved per opthalmology.  -  optho recommends ongoing consvt mgt   - drops/lubricant left eye 13. Facial cellulitis: keflex was completed  15. Thrombocytosis: Likely reactive.    16. ABLA:   Hemoglobin 11.6 on 10/17 17. Abnormal LFTs: Question due to shocked liver.   Labs ordered for Monday 18. H/o of MDD: Was on Trintellix 5 mg and Risperidone 5 mg bid at home--continue to hold for now.   -behavior reasonable at present 19. Urine retention/neurogenic bladder: foley out  -continue  I/O caths as he has no sense of bladder at this point  -attempts to improve emptying would probably only lead to incontinence  LOS (Days) [place] PERFORMED  Ankit Lorie Phenix, MD 06/29/2017 5:49 PM

## 2017-06-29 NOTE — Progress Notes (Signed)
Occupational Therapy Session Note  Patient Details  Name: [name] MRN: [id number] Date of Birth: 02-May-1966  Upon approach for offer for OT, Patient stated he could not do occupational therapy because he hurt his neck a couple of days ago and he was waiting for xray doctor ordered.     Spoke with nurse regarding this.   She planned to check with doctor regarding patient statement regarding xray.   patieht left with call bell within reach   Therapy Documentation Precautions:  Precautions Precautions: Fall, Cervical Required Braces or Orthoses: Cervical Brace Cervical Brace: Hard collar, At all times Other Brace/Splint: cam boot R LE; cam boot LLE, B resting hand splints for night use Restrictions Weight Bearing Restrictions: Yes RLE Weight Bearing: Weight bearing as tolerated LLE Weight Bearing: Non weight bearing General: General OT Amount of Missed Time: 30 Minutes (30)     Herschell Dimes 06/29/2017, 5:12 PM

## 2017-06-30 ENCOUNTER — Inpatient Hospital Stay (HOSPITAL_COMMUNITY): Payer: Self-pay | Admitting: Occupational Therapy

## 2017-06-30 DIAGNOSIS — N319 Neuromuscular dysfunction of bladder, unspecified: Secondary | ICD-10-CM

## 2017-06-30 LAB — BASIC METABOLIC PANEL
ANION GAP: 7 (ref 5–15)
BUN: 12 mg/dL (ref 6–20)
CALCIUM: 9.4 mg/dL (ref 8.9–10.3)
CO2: 27 mmol/L (ref 22–32)
CREATININE: 0.58 mg/dL — AB (ref 0.61–1.24)
Chloride: 99 mmol/L — ABNORMAL LOW (ref 101–111)
Glucose, Bld: 133 mg/dL — ABNORMAL HIGH (ref 65–99)
Potassium: 3.4 mmol/L — ABNORMAL LOW (ref 3.5–5.1)
SODIUM: 133 mmol/L — AB (ref 135–145)

## 2017-06-30 LAB — CBC
HCT: 33.4 % — ABNORMAL LOW (ref 39.0–52.0)
HEMOGLOBIN: 10.9 g/dL — AB (ref 13.0–17.0)
MCH: 27 pg (ref 26.0–34.0)
MCHC: 32.6 g/dL (ref 30.0–36.0)
MCV: 82.9 fL (ref 78.0–100.0)
PLATELETS: 238 10*3/uL (ref 150–400)
RBC: 4.03 MIL/uL — AB (ref 4.22–5.81)
RDW: 15.9 % — ABNORMAL HIGH (ref 11.5–15.5)
WBC: 7.5 10*3/uL (ref 4.0–10.5)

## 2017-06-30 NOTE — Progress Notes (Signed)
Occupational Therapy Session Note  Patient Details  Name: [name] MRN: [id number] Date of Birth: [birth year]  Today's Date: 06/30/2017 OT Individual Time: 2025-4270 OT Individual Time Calculation (min): 60 min   Skilled Therapeutic Interventions/Progress Updates:    Tx focus on ADL retraining, functional transfers, and bilateral UE NMR during functional and meaningful activities.   Pt greeted supine in bed, agreeable to tx. 2 helpers for UB/LB dressing, LB dressing completed 1st. Pt able to assist with elevating each leg for OT to thread pants. With [name] for each hand, pt able to grasp and assist with lifting pants from lower thighs to upper thighs. Also tried one legged bridging with R LE to elevate pants x2 with pt unable to achieve full clearance of buttocks. Rolling R>L with Max A while additional helper lifted pants fully over hips. OT donned Lt CAM boot (Rt boot already donned). Pt transitioned to sitting EOB, able to maintain static balance with Min guard and bilateral LEs supported. Assisting with threading each hand into button up shirt. Pt completed slideboard transfer to TIS (bed deflated) with 2 helpers for facilitation of head/hips relationship and manual facilitation of weight shifting. Pt pushing with R LE to assist with transfer. Cues provided for NWB L LE. Once in w/c, pt was escorted to dayroom. He requested to participate in familiar cone stacking activity. Pt stacking/flipping large and small cones with extra time and Min A for reaching demands. Listened to self selected music, with pt singing along to a few favorite songs. Affected visibly improved, though pt still presented with sadness (which he is open about). Thanked him for participating today, provided encouragement and support. He was agreeable to sit up in TIS for lunch. Pt escorted back to room, repositioned for comfort, and left with soft call bell in hand.   Therapy Documentation Precautions:   Precautions Precautions: Fall, Cervical Required Braces or Orthoses: Cervical Brace Cervical Brace: Hard collar, At all times Other Brace/Splint: cam boot R LE; cam boot LLE, B resting hand splints for night use Restrictions Weight Bearing Restrictions: Yes RLE Weight Bearing: Weight bearing as tolerated LLE Weight Bearing: Non weight bearing   Pain: No c/o pain during tx    ADL:      See Function Navigator for Current Functional Status.  Therapy/Group: Individual Therapy  [name] 06/30/2017, 12:52 PM

## 2017-06-30 NOTE — Plan of Care (Signed)
Problem: RH KNOWLEDGE DEFICIT SCI Goal: RH STG INCREASE KNOWLEDGE OF SELF CARE AFTER SCI Pt and family will increase knowledge of self-care with Mod I Assistance using resources  Outcome: Not Progressing Patient cannot do anything for himself at his time dependent on care  Problem: SCI BLADDER ELIMINATION Goal: RH STG MANAGE BLADDER WITH EQUIPMENT WITH ASSISTANCE STG Manage Bladder With Equipment With mod Assistance  Outcome: Not Progressing Patient on every 4 hours I and O

## 2017-06-30 NOTE — Progress Notes (Signed)
[place] PHYSICAL MEDICINE & REHABILITATION     PROGRESS NOTE    Subjective/Complaints: Pt seen laying in bed this AM.  He slept well overnight.  He has questions about his neck.  Films reviewed, with patient.  He notes improvement in neck pain.  ROS: Denies nausea, vomiting, diarrhea, shortness of breath or chest pain   Objective: Vital Signs: Blood pressure 110/64, pulse 89, temperature 98.4 F (36.9 C), temperature source Oral, resp. rate 12, height 5\' 6"  (1.676 m), weight 91.8 kg (202 lb 4.8 oz), SpO2 96 %. Dg Cervical Spine 1 View  Result Date: 06/29/2017 CLINICAL DATA:  Neck pain following injury. History of occipital-C3 fusion. EXAM: CERVICAL SPINE 1 VIEW COMPARISON:  05/10/2017 operative films. FINDINGS: Posterior fusion from the occiput to C3 again noted without definite complicating features. No acute fracture subluxation noted. Mild multilevel degenerative disc disease and spondylosis in the mid and lower cervical spine noted. IMPRESSION: No evidence of acute abnormality. Occipital-C3 fusion again noted. Electronically Signed   By: Margarette Canada M.D.   On: 06/29/2017 14:59   No results for input(s): WBC, HGB, HCT, PLT in the last 72 hours. No results for input(s): NA, K, CL, GLUCOSE, BUN, CREATININE, CALCIUM in the last 72 hours.  Invalid input(s): CO CBG (last 3)  No results for input(s): GLUCAP in the last 72 hours.  Wt Readings from Last 3 Encounters:  06/30/17 91.8 kg (202 lb 4.8 oz)  05/21/17 99 kg (218 lb 3.2 oz)  01/11/17 91.6 kg (202 lb)    Physical Exam:  Constitutional: He appears well-developed. + Distressed HENT: Normocephalic. Atraumatic. Eyes: Unable to move right eye laterally.  Neck:  Immobilized by Aspen collar  Cardiovascular: RRR. No JVD                    Respiratory: CTA Bilaterally. Normal effort           GI: abdomen non-distended. PEG site healing Musculoskeletal: mild b/l LE edema Neurological:  Alert Motor  2/5 prox to distal B/l UE's.   Left III, VI nerve weakness .  Right VI deficit B/l LE 2+/5 HF Skin: Skin is warmand dry.  Wounds all dry/healing.   Psychiatric: Normal mood Normal behaviour  Assessment/Plan: 1. Functional deficits, tetraplegia secondary to TBI/cervical SCI which require 3+ hours per day of interdisciplinary therapy in a comprehensive inpatient rehab setting. Physiatrist is providing close team supervision and 24 hour management of active medical problems listed below. Physiatrist and rehab team continue to assess barriers to discharge/monitor patient progress toward functional and medical goals.  Function:  Bathing Bathing position   Position: Bed  Bathing parts   Body parts bathed by helper: Right arm, Left arm, Chest, Abdomen, Front perineal area, Buttocks, Right upper leg, Left upper leg, Right lower leg, Left lower leg, Back  Bathing assist Assist Level: 2 helpers      Upper Body Dressing/Undressing Upper body dressing   What is the patient wearing?: Hospital gown           Button up shirt - Perfomed by helper: Thread/unthread right sleeve, Thread/unthread left sleeve, Pull shirt around back, Button/unbutton shirt    Upper body assist Assist Level: 2 helpers      Lower Body Dressing/Undressing Lower body dressing   What is the patient wearing?: Hospital Gown       Pants- Performed by helper: Thread/unthread right pants leg, Thread/unthread left pants leg, Pull pants up/down  Lower body assist Assist for lower body dressing: 2 Helpers      Toileting Toileting Toileting activity did not occur: No continent bowel/bladder event   Toileting steps completed by helper: Adjust clothing prior to toileting, Performs perineal hygiene, Adjust clothing after toileting    Toileting assist Assist level:  (totalA)   Transfers Chair/bed transfer Chair/bed transfer activity did not occur: N/A Chair/bed transfer method: Lateral scoot Chair/bed transfer  assist level: 2 helpers Chair/bed transfer assistive device: Sliding board, Armrests Mechanical lift: Maximove   Locomotion Ambulation Ambulation activity did not occur: Safety/medical Editor, commissioning activity did not occur: Safety/medical concerns Type: Manual Max wheelchair distance: 15 Assist Level: 2 helpers  Cognition Comprehension Comprehension assist level: Follows complex conversation/direction with no assist  Expression Expression assist level: Expresses complex ideas: With no assist  Social Interaction Social Interaction assist level: Interacts appropriately with others - No medications needed.  Problem Solving Problem solving assist level: Solves complex problems: Recognizes & self-corrects  Memory Memory assist level: Complete Independence: No helper   Medical Problem List and Plan: 1. Functional, cognitive and mobility deficitssecondary to Moderate TBI, C4 spinal cord injury -Cont CIR   -now WBAT RLE in boot --attempting some wb with therapies  -SNF pending  -Cervical x-ray reviewed, showing previous fusion, negative for acute changes.   2. DVT Prophylaxis/Anticoagulation: Pharmaceutical: Lovenox 3. Chronic neck pain/Pain Management: On Gabapentin bid   -changed hydrocodone to oxycodone for breakthrough pain  - fentanyl patch  60mcg/hr    -Headaches are primarly related to occipital-cervical fractures  -can tape/pad collar to prevent it from rubbing on neck  Stabilizing 4. Mood: LCSW to follow for evaluation and support when appropriate.  5. Neuropsych: This patient is notcapable of making decisions on hisown behalf.  -continue deficits in memory/insight 6. Skin/Wound Care: Air mattress overlay. Maintain adequate nutritional and hydration status.  7. Fluids/Electrolytes/Nutrition: Monitor I/O.    -diet currently D2 thins. Advance per SLP 9. Left periorbital cellulitis/ CN III/VI palsy:  -continue local care  -follow up per  optho  -needs to use drops/ointment 10.Left femur fracture s/p IM nail: NWB for another 5-6 weeks from 06/20/17  -peristent left foot drop 11. Left open tibial fracture s/p I and D with ORIF: NWB--CAM boot.  11. Left mandibular fracture s/p CR /tongue laceration:   -wires removed, hardware completely removed  -advance diet as tolerated 12. Chemosis left eye: Continue Lacrilube every 3 hours till resolved per opthalmology.  -  optho recommends ongoing consvt mgt   - drops/lubricant left eye 13. Facial cellulitis: keflex was completed  15. Thrombocytosis: Likely reactive.    16. ABLA:   Hemoglobin 11.6 on 10/17  Labs ordered for tomorrow 17. Abnormal LFTs: Question due to shocked liver.   Labs ordered for tomorrow 18. H/o of MDD: Was on Trintellix 5 mg and Risperidone 5 mg bid at home--continue to hold for now.   -behavior reasonable at present 19. Urine retention/neurogenic bladder: foley out  -continue  I/O caths as he has no sense of bladder at this point  LOS (Days) [place] EVALUATION WAS PERFORMED  [name] Lorie Phenix, MD 06/30/2017 2:10 PM

## 2017-07-01 ENCOUNTER — Inpatient Hospital Stay (HOSPITAL_COMMUNITY): Payer: BC Managed Care – PPO | Admitting: Speech Pathology

## 2017-07-01 ENCOUNTER — Inpatient Hospital Stay (HOSPITAL_COMMUNITY): Payer: Self-pay | Admitting: Physical Therapy

## 2017-07-01 ENCOUNTER — Encounter (HOSPITAL_COMMUNITY): Payer: Self-pay | Admitting: Psychology

## 2017-07-01 ENCOUNTER — Inpatient Hospital Stay (HOSPITAL_COMMUNITY): Payer: Self-pay | Admitting: Occupational Therapy

## 2017-07-01 DIAGNOSIS — E876 Hypokalemia: Secondary | ICD-10-CM

## 2017-07-01 LAB — COMPREHENSIVE METABOLIC PANEL
ALBUMIN: 2.9 g/dL — AB (ref 3.5–5.0)
ALT: 29 U/L (ref 17–63)
AST: 21 U/L (ref 15–41)
Alkaline Phosphatase: 158 U/L — ABNORMAL HIGH (ref 38–126)
Anion gap: 7 (ref 5–15)
BUN: 10 mg/dL (ref 6–20)
CHLORIDE: 100 mmol/L — AB (ref 101–111)
CO2: 30 mmol/L (ref 22–32)
Calcium: 10 mg/dL (ref 8.9–10.3)
Creatinine, Ser: 0.67 mg/dL (ref 0.61–1.24)
GFR calc Af Amer: >60 mL/min (ref >60)
Glucose, Bld: 154 mg/dL — ABNORMAL HIGH (ref 65–99)
POTASSIUM: 3.8 mmol/L (ref 3.5–5.1)
Sodium: 137 mmol/L (ref 135–145)
Total Bilirubin: 0.5 mg/dL (ref 0.3–1.2)
Total Protein: 6.9 g/dL (ref 6.5–8.1)

## 2017-07-01 MED ORDER — POTASSIUM CHLORIDE CRYS ER 10 MEQ PO TBCR
10.0000 meq | EXTENDED_RELEASE_TABLET | Freq: Every day | ORAL | Status: DC
  Administered 2017-07-01 – 2017-07-19 (×19): 10 meq via ORAL
  Filled 2017-07-01 (×19): qty 1

## 2017-07-01 NOTE — Progress Notes (Signed)
Social Work Patient ID: [name], male   DOB: 1966-05-09, 51 y.o.   MRN: [id number]  Pt and wife aware that I am continuing to pursue SNF placement but no offers as of yet.  Have resent information to facilities.  Wessley Emert, LCSW

## 2017-07-01 NOTE — Progress Notes (Signed)
Physical Therapy Session Note  Patient Details  Name: [name] MRN: [id number] Date of Birth: November 17, 1965  Today's Date: 07/01/2017 PT Individual Time: 1000-1100 and 1445-1545 PT Individual Time Calculation (min): 60 min and 60 min (total 120 min)   Short Term Goals: Week 6:  PT Short Term Goal 1 (Week 6): Pt will demonstrate rolling R/L consistent modA with bed features  PT Short Term Goal 2 (Week 6): Pt will demonstrate supine <>sit with modA PT Short Term Goal 3 (Week 6): Pt will perform sit <>stand modA maintaining NWB LLE  Skilled Therapeutic Interventions/Progress Updates: Tx 1: Pt received supine in bed, denies pain and agreeable to treatment. Rolling R/L minA to don pants however does not complete full roll onto side. Supine>sit with HOB elevated and bedrails modA with improving core activation, LE movement and assist with UEs on bedrails. Transfer bed>w/c with mod/maxA, improving RLE assist with scooting, and cues required for head/hips relationship. Sit <>stand in parallel bars x3 trials with maxA, +2 to maintain LLE NWB. Note apparent extensor tone with posterior bias and LOB in standing, requiring ongoing cueing for postural control and weight shifting anteriorly. Once in standing, able to maintain balance with minA x20-30 sec before requiring seated rest break. Transfer w/c <>mat table modA as above. Sit <>stand x3 with eva walker and +2A to maintain LLE NWB and facilitate anterior weight shift. Utilized Estate manager/land agent for postural alignment. Returned to room totalA; remained semi-reclined in w/c with nursing staff present, all needs in reach.   Tx 2: pt received supine in bed, denies pain and agreeable to treatment. Supine>sit with HOB elevated to max height, pt able to move LEs off EOB without assist, and bring trunk to neutral with minA for placing UE on bedrails. Transfer bed>w/c and w/c <>mat table with consistent minA to level surface, modA to return to w/c when  mat lower than chair for additional challenge. Performed semi-reclined situps 2x10 with cues for exhalation during exertion. Modified russian twist with 1kg weighted ball in BUEs with facilitation to trunk for rotation. R/L elbow propped<>sitting for oblique strengthening and coordination 2x10 each side. BUE scapular mobilizations x5 reps each; educated pt on role of scapular mobility and strengthening for UE functional activities. Performed 1x10 scap retractions no resistance, 1x10 with orange theraband resistance however pt with posterior LOBs and difficulty maintaining balance. Returned to bed with minA transfer. Sit>L sidelying with modA. Placed pt onto bedpan totalA; alerted NT to pt position on bedpan, and pt left with all needs in reach. Made up 15 min from missed OT session this AM.      Therapy Documentation Precautions:  Precautions Precautions: Fall, Cervical Required Braces or Orthoses: Cervical Brace Cervical Brace: Hard collar, At all times Other Brace/Splint: cam boot R LE; cam boot LLE, B resting hand splints for night use Restrictions Weight Bearing Restrictions: Yes RLE Weight Bearing: Weight bearing as tolerated LLE Weight Bearing: Non weight bearing   See Function Navigator for Current Functional Status.   Therapy/Group: Individual Therapy  [name] 07/01/2017, 12:05 PM
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
## 2017-07-01 NOTE — Progress Notes (Signed)
[place] PHYSICAL MEDICINE & REHABILITATION     PROGRESS NOTE    Subjective/Complaints: Off and on neck pain over weekend. No real changes. xrays negative.   ROS: pt denies nausea, vomiting, diarrhea, cough, shortness of breath or chest pain    Objective: Vital Signs: Blood pressure 136/89, pulse 75, temperature 98.6 F (37 C), temperature source Oral, resp. rate 14, height 5\' 6"  (1.676 m), weight 90.2 kg (198 lb 12.8 oz), SpO2 100 %. No results found.  Recent Labs  06/30/17 1309  WBC 7.5  HGB 10.9*  HCT 33.4*  PLT 238    Recent Labs  06/30/17 1309  NA 133*  K 3.4*  CL 99*  GLUCOSE 133*  BUN 12  CREATININE 0.58*  CALCIUM 9.4   CBG (last 3)  No results for input(s): GLUCAP in the last 72 hours.  Wt Readings from Last 3 Encounters:  07/01/17 90.2 kg (198 lb 12.8 oz)  05/21/17 99 kg (218 lb 3.2 oz)  01/11/17 91.6 kg (202 lb)    Physical Exam:  Constitutional: He appears well-developed. + Distressed HENT: Normocephalic. Atraumatic. Eyes: Unable to move right eye laterally.  Neck:  Immobilized by Aspen collar  Cardiovascular: RRR without murmur. No JVD                    Respiratory: CTA Bilaterally without wheezes or rales. Normal effort            GI: BS +, non-tender, non-distended  Musculoskeletal: mild b/l LE edema Neurological:  Alert Motor  2/5 prox to distal B/l UE's.  Left III, VI nerve weakness .  Right VI deficit B/l LE 2+/5 HF Skin: Skin is warmand dry.  Wounds all dry/healing.   Psychiatric: Normal mood Normal behaviour  Assessment/Plan: 1. Functional deficits, tetraplegia secondary to TBI/cervical SCI which require 3+ hours per day of interdisciplinary therapy in a comprehensive inpatient rehab setting. Physiatrist is providing close team supervision and 24 hour management of active medical problems listed below. Physiatrist and rehab team continue to assess barriers to discharge/monitor patient progress toward functional and medical  goals.  Function:  Bathing Bathing position   Position: Bed  Bathing parts   Body parts bathed by helper: Right arm, Left arm, Chest, Abdomen, Front perineal area, Buttocks, Right upper leg, Left upper leg, Right lower leg, Left lower leg, Back  Bathing assist Assist Level: 2 helpers      Upper Body Dressing/Undressing Upper body dressing   What is the patient wearing?: Button up shirt           Button up shirt - Perfomed by helper: Thread/unthread right sleeve, Thread/unthread left sleeve, Pull shirt around back, Button/unbutton shirt    Upper body assist Assist Level: 2 helpers      Lower Body Dressing/Undressing Lower body dressing   What is the patient wearing?: Pants       Pants- Performed by helper: Thread/unthread right pants leg, Thread/unthread left pants leg, Pull pants up/down                      Lower body assist Assist for lower body dressing: 2 Helpers      Toileting Toileting Toileting activity did not occur: No continent bowel/bladder event   Toileting steps completed by helper: Adjust clothing prior to toileting, Performs perineal hygiene, Adjust clothing after toileting    Toileting assist Assist level:  (totalA)   Transfers Chair/bed transfer Chair/bed transfer activity did not occur: N/A Chair/bed  transfer method: Lateral scoot Chair/bed transfer assist level: Maximal assist (Pt 25 - 49%/lift and lower) Chair/bed transfer assistive device: Sliding board, Armrests Mechanical lift: Maximove   Locomotion Ambulation Ambulation activity did not occur: Safety/medical Editor, commissioning activity did not occur: Safety/medical concerns Type: Manual Max wheelchair distance: 15 Assist Level: 2 helpers  Cognition Comprehension Comprehension assist level: Follows complex conversation/direction with extra time/assistive device, Understands complex 90% of the time/cues 10% of the time  Expression Expression assist level:  Expresses complex 90% of the time/cues < 10% of the time  Social Interaction Social Interaction assist level: Interacts appropriately 90% of the time - Needs monitoring or encouragement for participation or interaction.  Problem Solving Problem solving assist level: Solves complex 90% of the time/cues < 10% of the time  Memory Memory assist level: More than reasonable amount of time   Medical Problem List and Plan: 1. Functional, cognitive and mobility deficitssecondary to Moderate TBI, C4 spinal cord injury -Cont CIR   -now WBAT RLE in boot --attempting some wb with therapies  -SNF pending  -Cervical x-ray  showed previous fusion, negative for acute changes.   2. DVT Prophylaxis/Anticoagulation: Pharmaceutical: Lovenox 3. Chronic neck pain/Pain Management: On Gabapentin bid   -changed hydrocodone to oxycodone for breakthrough pain  - fentanyl patch  53mcg/hr    -Headaches are primarly related to occipital-cervical fractures  -can tape/pad collar to prevent it from rubbing on neck    4. Mood: LCSW to follow for evaluation and support when appropriate.  5. Neuropsych: This patient is notcapable of making decisions on hisown behalf.  -continue deficits in memory/insight 6. Skin/Wound Care: Air mattress overlay. Maintain adequate nutritional and hydration status.  7. Fluids/Electrolytes/Nutrition: Monitor I/O.    -diet currently D2 thins. Advance per SLP  -replace potassium 9. Left periorbital cellulitis/ CN III/VI palsy:  -continue local care  -follow up per optho  -needs to use drops/ointment 10.Left femur fracture s/p IM nail: NWB for another 5-6 weeks from 06/20/17  -peristent left foot drop 11. Left open tibial fracture s/p I and D with ORIF: NWB--CAM boot.  11. Left mandibular fracture s/p CR /tongue laceration:   -wires removed, hardware completely removed  -advancing diet as tolerated 12. Chemosis left eye: Continue Lacrilube every 3 hours till resolved per  opthalmology.  -  optho recommends ongoing consvt mgt   - drops/lubricant left eye 13. Facial cellulitis: keflex was completed  15. Thrombocytosis: Likely reactive.    16. ABLA:   Hemoglobin 10.9 10/21    17. Abnormal LFTs: Question due to shocked liver.     55. H/o of MDD: Was on Trintellix 5 mg and Risperidone 5 mg bid at home--continue to hold for now.   -behavior reasonable at present 19. Urine retention/neurogenic bladder: foley out  -continue  I/O caths as he has no sense of bladder at this point  LOS (Days) 41 A FACE TO FACE EVALUATION WAS PERFORMED  Meredith Staggers, MD 07/01/2017 2:24 PM

## 2017-07-01 NOTE — Progress Notes (Signed)
Occupational Therapy Note  Patient Details  Name: [name] MRN: [id number] Date of Birth: 18-Apr-1966  Today's Date: 07/01/2017 OT Missed Time: 75 Minutes Missed Time Reason: Patient unwilling/refused to participate without medical reason  Pt supine in bed sleeping upon entering the room. Pt encouraged to participate but continues to refuse. Pt stating, " It's too early and I am too tired right now for this." Pt left in supine position with soft call bell placed in L hand upon exiting the room. 75 missed minutes of OT intervention secondary to pt refusal.    Gypsy Decant 07/01/2017, 7:40 AM

## 2017-07-01 NOTE — Progress Notes (Signed)
Speech Language Pathology Daily Session Note  Patient Details  Name: [name] MRN: [id number] Date of Birth: [birth year]  Today's Date: 07/01/2017 SLP Individual Time: 1115-1200 SLP Individual Time Calculation (min): 45 min  Short Term Goals: Week 6: SLP Short Term Goal 1 (Week 6): Pt will consume trials of dsyphagia 3 with complete oral clearing and minimal s/s of aspiration to demonstrate readiness for diet upgrade once MD allows.  SLP Short Term Goal 2 (Week 6): Given a problematic situation, pt will provide appropriate solution iwth supervision cues and 90% accuracy.   Skilled Therapeutic Interventions:   Skilled treatment session focused on cognition goals. SLP facilitated session by providing Min A cues for awareness of deficits and the impact on mobility/weight bearing. Pt with flat affect and decreased interaction, stated he was thinking about fact he needed to go to SNF. Support given.   Function:  Eating Eating   Modified Consistency Diet: No Eating Assist Level: Help with picking up utensils;Help managing cup/glass           Cognition Comprehension Comprehension assist level: Follows complex conversation/direction with extra time/assistive device;Understands complex 90% of the time/cues 10% of the time  Expression   Expression assist level: Expresses complex 90% of the time/cues < 10% of the time  Social Interaction Social Interaction assist level: Interacts appropriately 90% of the time - Needs monitoring or encouragement for participation or interaction.  Problem Solving Problem solving assist level: Solves complex 90% of the time/cues < 10% of the time  Memory Memory assist level: More than reasonable amount of time    Pain    Therapy/Group: Individual Therapy  [name] 07/01/2017, 11:43 AM

## 2017-07-02 ENCOUNTER — Inpatient Hospital Stay (HOSPITAL_COMMUNITY): Payer: BC Managed Care – PPO | Admitting: Occupational Therapy

## 2017-07-02 ENCOUNTER — Inpatient Hospital Stay (HOSPITAL_COMMUNITY): Payer: Self-pay | Admitting: Physical Therapy

## 2017-07-02 ENCOUNTER — Inpatient Hospital Stay (HOSPITAL_COMMUNITY): Payer: Self-pay | Admitting: Speech Pathology

## 2017-07-02 NOTE — Progress Notes (Signed)
Occupational Therapy Weekly Progress Note  Patient Details  Name: [name] MRN: [id number] Date of Birth: April 28, 1966  Beginning of progress report period: June 21, 2017 End of progress report period: July 02, 2017  Today's Date: 07/02/2017  Patient has met 3 of 3 short term goals.  Pt has made excellent progress with his trunk control to allow him to roll in bed with min A, sit unsupported at EOB with S, and lateral lean sitting EOB with steadying A. Due to severely impaired UE AROM he continues to need total A with LB self care and is now completing UB dressing with max A and grooming with set up. Pt also required max to total A to self feed. Pt continues to be limited by pain and challenged by feelings of  Being overwhelmed and can perseverate on pain. Pt still requires being cathed and total A for bowel program!  Patient continues to demonstrate the following deficits: muscle weakness and muscle paralysis, unbalanced muscle activation and decreased coordination, decreased problem solving and decreased sitting balance, decreased standing balance and decreased postural control and therefore will continue to benefit from skilled OT intervention to enhance overall performance with BADL and Reduce care partner burden.  Patient progressing toward long term goals..  Continue plan of care.  OT Short Term Goals Week 5:  OT Short Term Goal 1 (Week 5): Pt will roll to R/L in bed with mod A to facilitate LB dressing tasks at bed level OT Short Term Goal 1 - Progress (Week 5): Met OT Short Term Goal 2 (Week 5): Pt will perform lateral leans sitting EOB with mod A to facilitate placement of slide board OT Short Term Goal 2 - Progress (Week 5): Met OT Short Term Goal 3 (Week 5): Pt will maintain dynamic sitting balance EOB with mod A to facilitate UB dressing tasks OT Short Term Goal 3 - Progress (Week 5): Met Week 6:  OT Short Term Goal 1 (Week 6): Pt will be able to use slide board  with max A to wide drop arm BSC. OT Short Term Goal 2 (Week 6): Pt will be able to lateral lean on BSC with min A to allow caregivers to manage clothing and clean up.    Skilled Therapeutic Interventions/Progress Updates:  Pt's plan continues to be to transition to SNF.  Pt still has 3 weeks before weight bearing allowed on left foot.    Therapy Documentation Precautions:  Precautions Precautions: Fall, Cervical Required Braces or Orthoses: Cervical Brace Cervical Brace: Hard collar, At all times Other Brace/Splint: cam boot R LE; cam boot LLE, B resting hand splints for night use Restrictions Weight Bearing Restrictions: Yes RLE Weight Bearing: Weight bearing as tolerated LLE Weight Bearing: Non weight bearing   Pain: Pain Assessment Pain Assessment: No/denies pain Pain Score: Asleep ADL:  See Function Navigator for Current Functional Status.   Therapy/Group: Individual Therapy  [place] 07/02/2017, 9:14 AM

## 2017-07-02 NOTE — Progress Notes (Signed)
Physical Therapy Session Note  Patient Details  Name: [name] MRN: [id number] Date of Birth: 06/08/1966  Today's Date: 07/02/2017 PT Individual Time: 1194-1740 and [phone] PT Individual Time Calculation (min): 70 min and 70 min (total 140 min)  Short Term Goals: Week 6:  PT Short Term Goal 1 (Week 6): Pt will demonstrate rolling R/L consistent modA with bed features  PT Short Term Goal 2 (Week 6): Pt will demonstrate supine <>sit with modA PT Short Term Goal 3 (Week 6): Pt will perform sit <>stand modA maintaining NWB LLE  Skilled Therapeutic Interventions/Progress Updates:  Tx 1: Pt received with handoff from NT to complete eating breakfast; denies pain and agreeable to treatment. Pt attempted self-feeding with BUE using utensils and retrieving cup to drink from; requires assist for support at elbow d/t shoulder strength/coordination deficits. However pt easily frustrated by task and therapist assisted with remainder of meal. Transfer w/c <>mat table modA with transfer board. Sit >supine maxA with logroll. Rolling R/L with maxA initially, reduced to min/modA with repetition. Pelvic PNF flexion/extension with manual resistance and quick stretch for facilitation. Supine>sit with logroll and modA. Sit <>stand from elevated mat table with eva walker and maxA, +2 for safety; therapist facilitating anterior weight shift and tactile cueing for glute activation in stance. Returned mat>w/c and w/c >bed with modA and slideboard. Sit >supine totalA +2. Remained supine in bed at end of session, all needs in reach.   tx 2: Pt received seated on EOB with handoff from RN; denies pain and agreeable to treatment. Transfer bed>w/c and w/c <>mat table during session modA with transfer board. UE ergometer x8 min with BUE forward. Transferred into ultra lightweight w/c and theraband applied to B w/c rims for improved grip; pt able to propel x75' with BUE very slowly and with LUE dominant over R, minA  occasionally for steering to avoid obstacles. Transfer to [name] with transfer board and +2A; required [place] for scooting back to center on opening and pt ultimately unable to void. Lateral leans to doff pants and brief. Transferred back to bed with chuck pad and slideboard, no pants/brief. Sit >supine totalA +2. Remained supine in bed at end of session with handoff to RN/NT.      Therapy Documentation Precautions:  Precautions Precautions: Fall, Cervical Required Braces or Orthoses: Cervical Brace Cervical Brace: Hard collar, At all times Other Brace/Splint: cam boot R LE; cam boot LLE, B resting hand splints for night use Restrictions Weight Bearing Restrictions: Yes RLE Weight Bearing: Weight bearing as tolerated LLE Weight Bearing: Non weight bearing Pain: Pain Assessment Pain Assessment: No/denies pain   See Function Navigator for Current Functional Status.   Therapy/Group: Individual Therapy  [name] 07/02/2017, 10:55 AM

## 2017-07-02 NOTE — Progress Notes (Signed)
SLP Cancellation Note  Patient Details Name: Aziel Morgan MRN: [id number] DOB: [birth year]   Cancelled treatment:        Attempted to see pt for skilled ST.  Pt somnolent upon therapist's arrival and was unable to awaken sufficiently for meaningful participation in therapies.  Pt requested for therapist to come back later but was still somnolent upon therapist's return despite modification to the environment to maximize alertness.  Discussed with RN who reported that pt was given pain medication and scheduled Robaxin prior to therapist's arrival in addition to not having slept well the night before.  Will plan to see pt for therapy at next available appointment.                                                                                                 Renny Gunnarson, Selinda Orion 07/02/2017, 4:22 PM

## 2017-07-02 NOTE — Progress Notes (Signed)
Occupational Therapy Session Note  Patient Details  Name: [name] MRN: [id number] Date of Birth: July 16, 1966  Today's Date: 07/02/2017 OT Individual Time: 1740-8144 OT Individual Time Calculation (min): 60 min    Short Term Goals: Week 6:  OT Short Term Goal 1 (Week 6): Pt will be able to use slide board with max A to wide drop arm BSC. OT Short Term Goal 2 (Week 6): Pt will be able to lateral lean on BSC with min A to allow caregivers to manage clothing and clean up.    Skilled Therapeutic Interventions/Progress Updates:    Pt seen for ADL training and facilitation of UE strength.  Pt received in bed and demonstrated the ability to roll in bed with min A with cues to bring arm across his trunk for efficient rolling. LB self care from supine.  He is sitting to EOB with close S by adjusting the [facility] at a high level. Once sitting he practiced sitting unsupported and using lateral leans.  Pt transferred to w/c with mod A using slide board. Pt was very determined to move himself.  Due to limited wrist extension,  He is not able to use his hands effectively on board.  Once in chair, UB self care completed.  Pt needed several adjustments to get his hips all the way back in the chair.    Pt worked on A/AROM of shoulders with horizontal abduction and adduction, elbow flexion.  He was able to complete resisted elbow extension.  He has limited PROM of wrist extensors, worked on gentle stretches and retrograde massage as he has edema over dorsum of hands.  Educated pt to ask his wife to assist with this massage and wrist stretches in the evenings.   Pt requested to sit at the nurses station.    Therapy Documentation Precautions:  Precautions Precautions: Fall, Cervical Required Braces or Orthoses: Cervical Brace Cervical Brace: Hard collar, At all times Other Brace/Splint: cam boot R LE; cam boot LLE, B resting hand splints for night use Restrictions Weight Bearing Restrictions:  Yes RLE Weight Bearing: Weight bearing as tolerated LLE Weight Bearing: Non weight bearing   Pain: Pain Assessment Pain Assessment: No/denies pain    ADL:  See Function Navigator for Current Functional Status.   Therapy/Group: individual  SAGUIER,JULIA 07/02/2017, 12:09 PM

## 2017-07-02 NOTE — Progress Notes (Signed)
[place] PHYSICAL MEDICINE & REHABILITATION     PROGRESS NOTE    Subjective/Complaints: No new complaints. Neck pain under control  ROS: pt denies nausea, vomiting, diarrhea, cough, shortness of breath or chest pain    Objective: Vital Signs: Blood pressure 122/75, pulse 93, temperature 98.4 F (36.9 C), temperature source Oral, resp. rate 18, height 5\' 6"  (1.676 m), weight 81.7 kg (180 lb 3.2 oz), SpO2 97 %. No results found.  Recent Labs  06/30/17 1309  WBC 7.5  HGB 10.9*  HCT 33.4*  PLT 238    Recent Labs  06/30/17 1309 07/01/17 1858  NA 133* 137  K 3.4* 3.8  CL 99* 100*  GLUCOSE 133* 154*  BUN 12 10  CREATININE 0.58* 0.67  CALCIUM 9.4 10.0   CBG (last 3)  No results for input(s): GLUCAP in the last 72 hours.  Wt Readings from Last 3 Encounters:  07/02/17 81.7 kg (180 lb 3.2 oz)  05/21/17 99 kg (218 lb 3.2 oz)  01/11/17 91.6 kg (202 lb)    Physical Exam:  Constitutional: He appears well-developed. + Distressed HENT: Normocephalic. Atraumatic. Eyes: Unable to move right eye laterally.  Neck:  Immobilized by Aspen collar  Cardiovascular: RRR without murmur. No JVD                    Respiratory: CTA Bilaterally without wheezes or rales. Normal effort            GI: BS +, non-tender, non-distended  Musculoskeletal: mild b/l LE edema Neurological:  Alert Motor  2 to 2+/5 prox to distal B/L UE's.  Left III, VI nerve weakness . III showing some improvement Right VI deficit B/l LE 2+/5 HF. RLE 2-3/5 ADF/PF Skin: Skin is warmand dry.  Wounds all dry/healing.   Psychiatric: pleasant  Assessment/Plan: 1. Functional deficits, tetraplegia secondary to TBI/cervical SCI which require 3+ hours per day of interdisciplinary therapy in a comprehensive inpatient rehab setting. Physiatrist is providing close team supervision and 24 hour management of active medical problems listed below. Physiatrist and rehab team continue to assess barriers to  discharge/monitor patient progress toward functional and medical goals.  Function:  Bathing Bathing position   Position: Bed  Bathing parts   Body parts bathed by helper: Right arm, Left arm, Chest, Abdomen, Front perineal area, Buttocks, Right upper leg, Left upper leg, Right lower leg, Left lower leg, Back  Bathing assist Assist Level: 2 helpers      Upper Body Dressing/Undressing Upper body dressing   What is the patient wearing?: Button up shirt           Button up shirt - Perfomed by helper: Thread/unthread right sleeve, Thread/unthread left sleeve, Pull shirt around back, Button/unbutton shirt    Upper body assist Assist Level: 2 helpers      Lower Body Dressing/Undressing Lower body dressing   What is the patient wearing?: Pants       Pants- Performed by helper: Thread/unthread right pants leg, Thread/unthread left pants leg, Pull pants up/down                      Lower body assist Assist for lower body dressing: 2 Helpers      Toileting Toileting Toileting activity did not occur: No continent bowel/bladder event   Toileting steps completed by helper: Adjust clothing prior to toileting, Performs perineal hygiene, Adjust clothing after toileting    Toileting assist Assist level:  (totalA)   Transfers Chair/bed transfer  Chair/bed transfer activity did not occur: N/A Chair/bed transfer method: Lateral scoot Chair/bed transfer assist level: Touching or steadying assistance (Pt > 75%) Chair/bed transfer assistive device: Armrests, Sliding board Mechanical lift: Maximove   Locomotion Ambulation Ambulation activity did not occur: Safety/medical Editor, commissioning activity did not occur: Safety/medical concerns Type: Manual Max wheelchair distance: 15 Assist Level: 2 helpers  Cognition Comprehension Comprehension assist level: Understands complex 90% of the time/cues 10% of the time  Expression Expression assist level:  Expresses complex 90% of the time/cues < 10% of the time  Social Interaction Social Interaction assist level: Interacts appropriately 90% of the time - Needs monitoring or encouragement for participation or interaction.  Problem Solving Problem solving assist level: Solves complex 90% of the time/cues < 10% of the time  Memory Memory assist level: Recognizes or recalls 90% of the time/requires cueing < 10% of the time   Medical Problem List and Plan: 1. Functional, cognitive and mobility deficitssecondary to Moderate TBI, C4 spinal cord injury -Cont CIR   -now WBAT RLE in boot --attempting some wb with therapies  -SNF pending  -    2. DVT Prophylaxis/Anticoagulation: Pharmaceutical: Lovenox 3. Chronic neck pain/Pain Management: On Gabapentin bid   -changed hydrocodone to oxycodone for breakthrough pain  - fentanyl patch  32mcg/hr    -Headaches are primarly related to occipital-cervical fractures  -can tape/pad collar to prevent it from rubbing on neck    4. Mood: LCSW to follow for evaluation and support when appropriate.  5. Neuropsych: This patient is notcapable of making decisions on hisown behalf.  -continue deficits in memory/insight 6. Skin/Wound Care: Air mattress overlay. Maintain adequate nutritional and hydration status.  7. Fluids/Electrolytes/Nutrition: Monitor I/O.    -diet currently D2 thins. Advance per SLP  -replacing potassium 9. Left periorbital cellulitis/ CN III/VI palsy:  -continue local care  -follow up per optho  -needs to use drops/ointment 10.Left femur fracture s/p IM nail: NWB for another 5-6 weeks from 06/20/17  -peristent left foot drop 11. Left open tibial fracture s/p I and D with ORIF: NWB--CAM boot.  11. Left mandibular fracture s/p CR /tongue laceration:   -wires removed, hardware completely removed  -advancing diet as tolerated 12. Chemosis left eye: Continue Lacrilube every 3 hours till resolved per opthalmology.  -  optho  recommends ongoing consvt mgt   - drops/lubricant left eye 13. Facial cellulitis: keflex was completed  15. Thrombocytosis: Likely reactive.    16. ABLA:   Hemoglobin 10.9 10/21    17. Abnormal LFTs: Question due to shocked liver.     10. H/o of MDD: Was on Trintellix 5 mg and Risperidone 5 mg bid at home--continue to hold for now.   -behavior reasonable at present 19. Urine retention/neurogenic bladder: foley out  -continue  I/O caths as he has no sense of bladder at this point  LOS (Days) 42 A FACE TO FACE EVALUATION WAS PERFORMED  Meredith Staggers, MD 07/02/2017 8:50 AM

## 2017-07-02 NOTE — Plan of Care (Signed)
Problem: SCI BLADDER ELIMINATION Goal: RH STG MANAGE BLADDER WITH EQUIPMENT WITH ASSISTANCE STG Manage Bladder With Equipment With mod Assistance  Outcome: Not Progressing Total assist from staff

## 2017-07-03 ENCOUNTER — Inpatient Hospital Stay (HOSPITAL_COMMUNITY): Payer: Self-pay | Admitting: Physical Therapy

## 2017-07-03 ENCOUNTER — Inpatient Hospital Stay (HOSPITAL_COMMUNITY): Payer: BC Managed Care – PPO | Admitting: Speech Pathology

## 2017-07-03 ENCOUNTER — Inpatient Hospital Stay (HOSPITAL_COMMUNITY): Payer: Self-pay

## 2017-07-03 ENCOUNTER — Inpatient Hospital Stay (HOSPITAL_COMMUNITY): Payer: Self-pay | Admitting: *Deleted

## 2017-07-03 NOTE — Plan of Care (Signed)
Problem: RH Balance Goal: LTG Patient will maintain dynamic sitting balance (PT) LTG:  Patient will maintain dynamic sitting balance with assistance during mobility activities (PT)  Upgraded d/t progress  Problem: RH Bed Mobility Goal: LTG Patient will perform bed mobility with assist (PT) LTG: Patient will perform bed mobility with assistance, with/without cues (PT).  Upgraded d/t progress  Problem: RH Bed to Chair Transfers Goal: LTG Patient will perform bed/chair transfers w/assist (PT) LTG: Patient will perform bed/chair transfers with assistance, with/without cues (PT).  Upgraded d/t progress  Problem: RH Car Transfers Goal: LTG Patient will perform car transfers with assist (PT) LTG: Patient will perform car transfers with assistance (PT).  Goal added d/t progress

## 2017-07-03 NOTE — Progress Notes (Signed)
Social Work Patient ID: [name], male   DOB: 1966-08-22, 51 y.o.   MRN: [id number]  Have reviewed team conference with pt and left VM for wife.  Pt feels he is continuing to make progress but wonders when he will d/c to SNF.   I have explained to both that I am continuing to reach out to SNFs in this and the immediate surrounding area but have had no luck with finding bed yet.  Anticipate will need to widen bed search quite a bit - will discuss acute SW AD.  Continue to follow.  Colby Catanese, LCSW

## 2017-07-03 NOTE — Progress Notes (Signed)
Speech Language Pathology Daily Session Note  Patient Details  Name: [name] MRN: [id number] Date of Birth: 1966/04/02  Today's Date: 07/03/2017 SLP Individual Time: 0830-0900 SLP Individual Time Calculation (min): 30 min  Short Term Goals: Week 6: SLP Short Term Goal 1 (Week 6): Pt will consume trials of dsyphagia 3 with complete oral clearing and minimal s/s of aspiration to demonstrate readiness for diet upgrade once MD allows.  SLP Short Term Goal 2 (Week 6): Given a problematic situation, pt will provide appropriate solution iwth supervision cues and 90% accuracy.   Skilled Therapeutic Interventions: Skilled treatment session focused on dysphagia and cognition goals. SLP facilitated session by providing skilled observation of pt consuming regular graham crackers. Pt consumed without overt s/s of aspiration. SLP further facilitated session by providing Mod A cues for awareness of functional mobility deficits and weight bearing status. Pt was returned to room, left upright in wheelchair with all needs within reach. Continue per current plan of care.      Function:  Eating Eating   Modified Consistency Diet: No Eating Assist Level: Set up assist for;Helper feeds patient;Helper brings food to mouth   Eating Set Up Assist For: Opening containers Helper Scoops Food on Utensil: Every scoop Helper Brings Food to Mouth: Occasionally   Cognition Comprehension Comprehension assist level: Understands complex 90% of the time/cues 10% of the time  Expression   Expression assist level: Expresses complex 90% of the time/cues < 10% of the time  Social Interaction Social Interaction assist level: Interacts appropriately 90% of the time - Needs monitoring or encouragement for participation or interaction.  Problem Solving Problem solving assist level: Solves complex 90% of the time/cues < 10% of the time  Memory Memory assist level: Recognizes or recalls 90% of the time/requires  cueing < 10% of the time    Pain Pain Assessment Pain Assessment: No/denies pain  Therapy/Group: Individual Therapy  Marcha Licklider 07/03/2017, 11:58 AM

## 2017-07-03 NOTE — Plan of Care (Signed)
Problem: RH Balance Goal: LTG Patient will maintain dynamic standing balance (PT) LTG:  Patient will maintain dynamic standing balance with assistance during mobility activities (PT) Goal added d/t progress and change in WB status
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
## 2017-07-03 NOTE — Progress Notes (Signed)
Speech Language Pathology Daily Session Note  Patient Details  Name: Rodgerick Gilliand MRN: [id number] Date of Birth: [birth year]  Today's Date: 07/03/2017 SLP Individual Time: 1100-1159 SLP Individual Time Calculation (min): 59 min  Short Term Goals: Week 6: SLP Short Term Goal 1 (Week 6): Pt will consume trials of dsyphagia 3 with complete oral clearing and minimal s/s of aspiration to demonstrate readiness for diet upgrade once MD allows.  SLP Short Term Goal 2 (Week 6): Given a problematic situation, pt will provide appropriate solution iwth supervision cues and 90% accuracy.   Skilled Therapeutic Interventions: Skilled ST services focused on swallow skills. Dr. Naaman Plummer upgraded diet to regular textured foods. Per nursing report pt refused regular textured breakfast tray due to fatigue. SLP facilitated PO consumption of regular textured foods on lunch tray. Pt directed care and attempted to feed self with Max assist, however consumed very large bite and lead to prolonged mastication and requesting to remove food from oral cavity. SLP educated and demonstrated appropriate bit sizes its impact on fatigue and aspiration risk, pt stated understanding. Pt consumed regular textured foods with mild oral residue, requesting next bite without fully clearing oral cavity, given Min A verbal cues pt cleared oral cavity. Pt demonstrated no overt s/s aspiration during PO consumption. Pt requested to lay back down and not finish try (25% complete) due to fatigue. Pt was left bed with call bell within reach. Recommend to continue ST services.      Function:  Eating Eating   Modified Consistency Diet: No Eating Assist Level: Set up assist for;Helper feeds patient;Helper brings food to mouth   Eating Set Up Assist For: Opening containers Helper Scoops Food on Utensil: Every scoop Helper Brings Food to Mouth: Occasionally   Cognition Comprehension Comprehension assist level: Understands complex 90% of  the time/cues 10% of the time  Expression   Expression assist level: Expresses complex 90% of the time/cues < 10% of the time  Social Interaction Social Interaction assist level: Interacts appropriately 90% of the time - Needs monitoring or encouragement for participation or interaction.  Problem Solving Problem solving assist level: Solves complex 90% of the time/cues < 10% of the time  Memory Memory assist level: Recognizes or recalls 90% of the time/requires cueing < 10% of the time    Pain Pain Assessment Pain Assessment: No/denies pain  Therapy/Group: Individual Therapy  Shelle Galdamez  [facility] 07/03/2017, 11:59 AM
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
## 2017-07-03 NOTE — Progress Notes (Signed)
Occupational Therapy Session Note  Patient Details  Name: Geoffery Aultman MRN: [id number] Date of Birth: 02-05-1966  Today's Date: 07/03/2017 OT Individual Time: 0700-0800 OT Individual Time Calculation (min): 60 min    Short Term Goals: Week 6:  OT Short Term Goal 1 (Week 6): Pt will be able to use slide board with max A to wide drop arm BSC. OT Short Term Goal 2 (Week 6): Pt will be able to lateral lean on BSC with min A to allow caregivers to manage clothing and clean up.    Skilled Therapeutic Interventions/Progress Updates:    Pt resting in bed upon arrival and initially stated he was "too tired to do anything." Pt agreeable to therapy after expectations were discussed.  Pt initiated rolling in bed requiring assist to bring UE across chest to grab bed rail.  Pt able to raise BLE to facilitate threading of pants.  Pt threaded BUE into shirt sleeves while seated EOB. Pt completed supine>sit EOB with min A for pushing up from side lying. Pt performed slide board transfer to w/c with mod A and mod verbal cues for UB positioning during transfer. Pt transitioned to day room and engaged in BUE tasks at table (reaching for cup and bring to mouth with support at elbow). Pt returned to room and remained in TIS w/c with soft call bell in LUE grasp.     Therapy Documentation Precautions:  Precautions Precautions: Fall, Cervical Required Braces or Orthoses: Cervical Brace Cervical Brace: Hard collar, At all times Other Brace/Splint: cam boot R LE; cam boot LLE, B resting hand splints for night use Restrictions Weight Bearing Restrictions: Yes RLE Weight Bearing: Weight bearing as tolerated LLE Weight Bearing: Non weight bearing Pain: Pain Assessment Pain Assessment: No/denies pain  See Function Navigator for Current Functional Status.   Therapy/Group: Individual Therapy  Leroy Libman 07/03/2017, 8:24 AM

## 2017-07-03 NOTE — Progress Notes (Signed)
Physical Therapy Weekly Progress Note  Patient Details  Name: [name] MRN: [id number] Date of Birth: 07-Jun-1966  Beginning of progress report period: June 26, 2017 End of progress report period: July 03, 2017  Today's Date: 07/03/2017 PT Individual Time: 0900-1000 and 1430-1530 PT Individual Time Calculation (min): 60 min and 60 min (total 120 min)   Patient has met 0 of 3 short term goals however has demonstrated progress towards all goals.  Pt currently requires mod/maxA for bed mobility, min to modA for transfers with transfer board to level or downhill surfaces. Standing progressing with eva walker and parallel bars however requires maxA to boost into standing, +2 for safety and to assist with maintaining NWB LLE. Pt progressing with w/c propulsion using BUEs in ultra lightweight w/c, however requires minA and very slow non-functional speed in controlled setting at this time.   Patient continues to demonstrate the following deficits muscle weakness, muscle joint tightness and muscle paralysis, decreased cardiorespiratoy endurance, impaired timing and sequencing, abnormal tone, unbalanced muscle activation and decreased coordination, decreased problem solving and decreased memory and decreased sitting balance, decreased standing balance, decreased postural control, decreased balance strategies and difficulty maintaining precautions and therefore will continue to benefit from skilled PT intervention to increase functional independence with mobility.  Patient progressing toward long term goals..  Plan of care revisions: upgraded sitting balance to S, bed mobility to modA, transfers to minA. Added car transfer goal modA, standing balance goal minA d/t progress and upgrade in weight bearing status. .  PT Short Term Goals Week 6:  PT Short Term Goal 1 (Week 6): Pt will demonstrate rolling R/L consistent modA with bed features  PT Short Term Goal 1 - Progress (Week 6):  Progressing toward goal PT Short Term Goal 2 (Week 6): Pt will demonstrate supine <>sit with modA PT Short Term Goal 2 - Progress (Week 6): Progressing toward goal PT Short Term Goal 3 (Week 6): Pt will perform sit <>stand modA maintaining NWB LLE PT Short Term Goal 3 - Progress (Week 6): Progressing toward goal Week 7:  PT Short Term Goal 1 (Week 7): Pt will demonstrate rolling R/L consistent modA with bed features  PT Short Term Goal 2 (Week 7): Pt will demonstrate supine <>sit with modA PT Short Term Goal 3 (Week 7): Pt will perform sit <>stand modA maintaining NWB LLE PT Short Term Goal 4 (Week 7): Pt will propel w/c x100' with BUE and minA  Skilled Therapeutic Interventions/Progress Updates: Tx 1: pt received seated in w/c, denies pain and agreeable to treatment. Transfer w/c <>mat table with transfer board and min/modA to level surface height. Semi-reclined sit ups with trunk rotation x10 reps. Performed PROM to B shoulders; significant limitations in shoulder ER and relief of pain/discomfort when stretched into ER. W/c propulsion x75' with occasional minA; performed navigating around cones and continues to demonstrate RUE lag behind LUE. Transfer performed w/c <>low couch, minA to transfer to couch downhill, maxA to transfer off couch with +2 to stabilize equipment. Educated pt on pressure relief in bed, goal of current air mattress and eventual goal of returning to regular bed with consistent turning schedule. Returned to bed with minA transfer. Sit >supine totalA +2. Remained supine in bed at end of session, all needs in reach.   Tx 2: pt received seated in bed, denies pain and agreeable to treatment. Supine>sit with modA for boosting trunk to sitting, able to move LEs off bed without assist. W/c propulsion x25' modA d/t fatigue  in UEs. Transfer bed<>w/c and w/c <>mat table modA for boosting hips. Sit <>stand x4 trials with eva walker and maxA +2 for maintaining NWB LLE and facilitating anterior  weight shift. Required cues in standing for weight shift to R side for neutral alignment d/t tendency to lean towards NWB L side. Returned to room d/t pt report of bowel incontinence while straining to stand. Returned to bed as above; small bowel movement noted in brief and pt requesting bedpan. Rolling modA R/L to doff/don brief and place/remove bedpan.  Remained supine at end of session, all needs in reach.      Therapy Documentation Precautions:  Precautions Precautions: Fall, Cervical Required Braces or Orthoses: Cervical Brace Cervical Brace: Hard collar, At all times Other Brace/Splint: cam boot R LE; cam boot LLE, B resting hand splints for night use Restrictions Weight Bearing Restrictions: Yes RLE Weight Bearing: Weight bearing as tolerated LLE Weight Bearing: Non weight bearing   See Function Navigator for Current Functional Status.  Therapy/Group: Individual Therapy  [name] 07/03/2017, 10:01 AM

## 2017-07-04 ENCOUNTER — Inpatient Hospital Stay (HOSPITAL_COMMUNITY): Payer: Self-pay

## 2017-07-04 ENCOUNTER — Inpatient Hospital Stay (HOSPITAL_COMMUNITY): Payer: Self-pay | Admitting: Physical Therapy

## 2017-07-04 ENCOUNTER — Inpatient Hospital Stay (HOSPITAL_COMMUNITY): Payer: BC Managed Care – PPO | Admitting: Speech Pathology
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
## 2017-07-04 NOTE — Progress Notes (Signed)
[place] PHYSICAL MEDICINE & REHABILITATION     PROGRESS NOTE    Subjective/Complaints: Had a good night. Continues to work hard with therapy. Pain seems better controlled  ROS: pt denies nausea, vomiting, diarrhea, cough, shortness of breath or chest pain     Objective: Vital Signs: Blood pressure 113/81, pulse 76, temperature 98.1 F (36.7 C), temperature source Oral, resp. rate 18, height 5\' 6"  (1.676 m), weight 88 kg (194 lb 0.1 oz), SpO2 99 %. No results found. No results for input(s): WBC, HGB, HCT, PLT in the last 72 hours.  Recent Labs  07/01/17 1858  NA 137  K 3.8  CL 100*  GLUCOSE 154*  BUN 10  CREATININE 0.67  CALCIUM 10.0   CBG (last 3)  No results for input(s): GLUCAP in the last 72 hours.  Wt Readings from Last 3 Encounters:  07/04/17 88 kg (194 lb 0.1 oz)  05/21/17 99 kg (218 lb 3.2 oz)  01/11/17 91.6 kg (202 lb)    Physical Exam:  Constitutional: He appears well-developed. + Distressed HENT: Normocephalic. Atraumatic. Eyes: Unable to move right eye laterally.  Neck:  Immobilized by Aspen collar  Cardiovascular: RRR without murmur. No JVD                     Respiratory: CTA Bilaterally without wheezes or rales. Normal effort             GI: BS +, non-tender, non-distended  Musculoskeletal: mild b/l LE edema Neurological:  Alert Motor  2 to 2+/5 prox to distal B/L UE's.  Left III, VI nerve weakness . III with some improvement Right VI deficit--no CN changes B/l LE 2+/5 HF. RLE 2-3/5 ADF/PF Skin: Skin is warmand dry.  Wounds essentially healed.    Psychiatric: pleasant  Assessment/Plan: 1. Functional deficits, tetraplegia secondary to TBI/cervical SCI which require 3+ hours per day of interdisciplinary therapy in a comprehensive inpatient rehab setting. Physiatrist is providing close team supervision and 24 hour management of active medical problems listed below. Physiatrist and rehab team continue to assess barriers to discharge/monitor  patient progress toward functional and medical goals.  Function:  Bathing Bathing position   Position: Bed  Bathing parts   Body parts bathed by helper: Right arm, Left arm, Chest, Abdomen, Front perineal area, Buttocks, Right upper leg, Left upper leg, Right lower leg, Left lower leg, Back  Bathing assist Assist Level: 2 helpers      Upper Body Dressing/Undressing Upper body dressing   What is the patient wearing?: Button up shirt           Button up shirt - Perfomed by helper: Thread/unthread right sleeve, Thread/unthread left sleeve, Pull shirt around back, Button/unbutton shirt    Upper body assist Assist Level: 2 helpers      Lower Body Dressing/Undressing Lower body dressing   What is the patient wearing?: Pants       Pants- Performed by helper: Thread/unthread right pants leg, Thread/unthread left pants leg, Pull pants up/down                      Lower body assist Assist for lower body dressing: 2 Helpers      Toileting Toileting Toileting activity did not occur: No continent bowel/bladder event   Toileting steps completed by helper: Adjust clothing prior to toileting, Performs perineal hygiene, Adjust clothing after toileting    Toileting assist Assist level:  (totalA)   Transfers Chair/bed transfer Chair/bed transfer activity  did not occur: N/A Chair/bed transfer method: Lateral scoot Chair/bed transfer assist level: Moderate assist (Pt 50 - 74%/lift or lower) Chair/bed transfer assistive device: Armrests, Sliding board Mechanical lift: Maximove   Locomotion Ambulation Ambulation activity did not occur: Safety/medical Editor, commissioning activity did not occur: Safety/medical concerns Type: Manual Max wheelchair distance: 75 Assist Level: Touching or steadying assistance (Pt > 75%)  Cognition Comprehension Comprehension assist level: Understands complex 90% of the time/cues 10% of the time  Expression Expression  assist level: Expresses complex 90% of the time/cues < 10% of the time  Social Interaction Social Interaction assist level: Interacts appropriately 90% of the time - Needs monitoring or encouragement for participation or interaction.  Problem Solving Problem solving assist level: Solves complex 90% of the time/cues < 10% of the time  Memory Memory assist level: Recognizes or recalls 90% of the time/requires cueing < 10% of the time   Medical Problem List and Plan: 1. Functional, cognitive and mobility deficitssecondary to Moderate TBI, C4 spinal cord injury -Cont CIR   -now WBAT RLE in boot --attempting some wb with therapies  -SNF search ongoing      2. DVT Prophylaxis/Anticoagulation: Pharmaceutical: Lovenox 3. Chronic neck pain/Pain Management: On Gabapentin bid   -changed hydrocodone to oxycodone for breakthrough pain  - fentanyl patch  45mcg/hr    -Headaches are primarly related to occipital-cervical fractures  -can tape/pad collar to prevent it from rubbing on neck    4. Mood: LCSW to follow for evaluation and support when appropriate.  5. Neuropsych: This patient is notcapable of making decisions on hisown behalf.  -continue deficits in memory/insight 6. Skin/Wound Care: Air mattress overlay. Maintain adequate nutritional and hydration status.  7. Fluids/Electrolytes/Nutrition: Monitor I/O.    -diet advanced to regular/thins.    -replacing potassium 9. Left periorbital cellulitis/ CN III/VI palsy:  -continue local care  -follow up per optho  -needs to use drops/ointment 10.Left femur fracture s/p IM nail: NWB for another 5-6 weeks from 06/20/17  -peristent left foot drop 11. Left open tibial fracture s/p I and D with ORIF: NWB--CAM boot.  11. Left mandibular fracture s/p CR /tongue laceration:   -wires removed, hardware completely removed  -advancing diet as tolerated 12. Chemosis left eye: Continue Lacrilube every 3 hours till resolved per  opthalmology.  -  optho recommends ongoing consvt mgt   - drops/lubricant left eye 13. Facial cellulitis: keflex was completed  15. Thrombocytosis: Likely reactive.    16. ABLA:   Hemoglobin 10.9 10/21    17. Abnormal LFTs: Question due to shocked liver.     61. H/o of MDD: Was on Trintellix 5 mg and Risperidone 5 mg bid at home--continue to hold for now.   -behavior reasonable at present 19. Urine retention/neurogenic bladder: foley out  -continue  I/O caths as he has no sense of bladder at this point  LOS (Days) 44 A FACE TO FACE EVALUATION WAS PERFORMED  Meredith Staggers, MD 07/04/2017 8:48 AM

## 2017-07-04 NOTE — Progress Notes (Signed)
Speech Language Pathology Daily Session Note  Patient Details  Name: [name] MRN: [id number] Date of Birth: 09-11-1965  Today's Date: 07/04/2017 SLP Individual Time: 1300-1330 SLP Individual Time Calculation (min): 30 min    Short Term Goals: Week 6: SLP Short Term Goal 1 (Week 6): Pt will consume trials of dsyphagia 3 with complete oral clearing and minimal s/s of aspiration to demonstrate readiness for diet upgrade once MD allows.  SLP Short Term Goal 2 (Week 6): Given a problematic situation, pt will provide appropriate solution iwth supervision cues and 90% accuracy.   Skilled Therapeutic Interventions: Skilled treatment session focused on dysphagia and cognition goals. SLP facilitated session by providing skilled observation and feeding of pt his regular lunch tray with thin liquids. Pt with complete oral clearing and no overt s/s of aspiration. Pt in highly distracting environment as his grandson was very energetic and distracting. Pt able to sustain attention and problem solve issues related to meal tray. Pt left upright in bed, mother present and all needs within reach. Continue per current plan of care.      Function:  Eating Eating   Modified Consistency Diet: No Eating Assist Level: Set up assist for;Helper feeds patient;Helper brings food to mouth   Eating Set Up Assist For: Opening containers Helper Scoops Food on Utensil: Every scoop Helper Brings Food to Mouth: Occasionally   Cognition Comprehension Comprehension assist level: Understands complex 90% of the time/cues 10% of the time  Expression   Expression assist level: Expresses complex 90% of the time/cues < 10% of the time  Social Interaction Social Interaction assist level: Interacts appropriately 90% of the time - Needs monitoring or encouragement for participation or interaction.  Problem Solving Problem solving assist level: Solves complex 90% of the time/cues < 10% of the time  Memory Memory  assist level: Recognizes or recalls 90% of the time/requires cueing < 10% of the time    Pain Pain Assessment Pain Assessment: 0-10 Pain Score: Asleep Pain Type: Acute pain Pain Location: Head Pain Descriptors / Indicators: Headache Pain Frequency: Occasional Pain Onset: Awakened from sleep Patients Stated Pain Goal: 2 Pain Intervention(s): Medication (See eMAR) Multiple Pain Sites: No  Therapy/Group: Individual Therapy  Myiesha Edgar 07/04/2017, 1:58 PM

## 2017-07-04 NOTE — Progress Notes (Signed)
Occupational Therapy Note  Patient Details  Name: [name] MRN: [id number] Date of Birth: 05/20/1966  Today's Date: 07/04/2017 OT Individual Time: 1345-1430 OT Individual Time Calculation (min): 45 min   Pt denies pain Individual therapy  Pt asleep in bed upon arrival but easily aroused.  Focus on BUE PROM/AAROM and AROM, and BUE stretching.  Pt engaged in chest presses with BUE pushing beach with shoulders at 90 degrees.  Pt attempted to apply pressure to ball to maintain grasp but needed hands wrapped with coban around ball to control beach ball.  Pt able to push ball approx 3" before assistance provided.  Pt remained in bed with all needs within reach.    [name] [facility] 07/04/2017, 2:40 PM

## 2017-07-04 NOTE — Progress Notes (Signed)
Occupational Therapy Session Note  Patient Details  Name: [name] MRN: [id number] Date of Birth: June 24, 1966  Today's Date: 07/04/2017 OT Individual Time: 0700-0800 OT Individual Time Calculation (min): 60 min    Short Term Goals: Week 6:  OT Short Term Goal 1 (Week 6): Pt will be able to use slide board with max A to wide drop arm BSC. OT Short Term Goal 2 (Week 6): Pt will be able to lateral lean on BSC with min A to allow caregivers to manage clothing and clean up.    Skilled Therapeutic Interventions/Progress Updates:    Pt asleep in bed upon arrival but easily aroused.  Focus on bed mobility, sitting balance EOB for UB dressing tasks, functional transfer with slide board, and grooming tasks.  Pt requires mod A for rolling in bed to facilitate pulling up pants but is able to perform partial sidelying with min A.  Pt required min A for supine>sit EOB and max verbal cues to maintain sitting balance EOB while donning shirt.  Pt able to thread BUE into sleeves but required assistance to pull over head.  Pt completed slide board transfer with mod A after placement of board.  Pt required support at elbow to enable pt washing face with wash cloth.  Pt is able to reposition self in w/c using BUE and RLE for boosting.  Pt remained in w/c with all needs within reach.   Therapy Documentation Precautions:  Precautions Precautions: Fall, Cervical Required Braces or Orthoses: Cervical Brace Cervical Brace: Hard collar, At all times Other Brace/Splint: cam boot R LE; cam boot LLE, B resting hand splints for night use Restrictions Weight Bearing Restrictions: Yes RLE Weight Bearing: Weight bearing as tolerated LLE Weight Bearing: Non weight bearing    Pain: Pain Assessment Pain Score: Asleep Faces Pain Scale: No hurt  See Function Navigator for Current Functional Status.   Therapy/Group: Individual Therapy  Leroy Libman 07/04/2017, 10:02 AM

## 2017-07-04 NOTE — Progress Notes (Signed)
Physical Therapy Session Note  Patient Details  Name: [name] MRN: [id number] Date of Birth: 1966-07-14  Today's Date: 07/04/2017 PT Individual Time: 1015-1130 PT Individual Time Calculation (min): 75 min   Short Term Goals: Week 7:  PT Short Term Goal 1 (Week 7): Pt will demonstrate rolling R/L consistent modA with bed features  PT Short Term Goal 2 (Week 7): Pt will demonstrate supine <>sit with modA PT Short Term Goal 3 (Week 7): Pt will perform sit <>stand modA maintaining NWB LLE PT Short Term Goal 4 (Week 7): Pt will propel w/c x100' with BUE and minA  Skilled Therapeutic Interventions/Progress Updates: Pt received supine in bed, c/o headache 7/10 pain and RN present during session to administer medication. Pt requesting to have I&O cath prior to participation; missed 15 min PT. Supine>sit with modA, increased time. Transfer bed <>w/c and w/c <>mat table with transfer board and modA. W/c propulsion x75' with BUE and minA; cues for reduced friction drag of hands on w/c when pulling back on rims to maintain momentum and reduce energy expenditure. Partial sit <>stand from elevated mat table with BUEs on chair armrests with therapist sitting in front of pt; performed for focus on RLE activation and carryover into pulling on/off pants for toileting. Performed partial sit >stand for squat pivot back to w/c; educated pt on continuing use of transfer board d/t small scoots at this time, as pt requesting to try transfer without. Returned to bed as above. PROM to BUE shoulder flexion with concurrent scapular upward rotation to reduce risk of impingement, external rotation, internal rotation; traction utilized for pain control in shoulder flexion and stabilization at scapula for internal rotation with reported reduction in pain. PROM to BLE hamstrings, hip internal/external rotators, glutes; pt c/o pain in L knee limiting tolerance. Remained supine in bed at end of session, all needs in  reach.      Therapy Documentation Precautions:  Precautions Precautions: Fall, Cervical Required Braces or Orthoses: Cervical Brace Cervical Brace: Hard collar, At all times Other Brace/Splint: cam boot R LE; cam boot LLE, B resting hand splints for night use Restrictions Weight Bearing Restrictions: Yes RLE Weight Bearing: Weight bearing as tolerated LLE Weight Bearing: Non weight bearing Pain: Pain Assessment Pain Assessment: 0-10 Pain Score: 7  Pain Type: Acute pain Pain Location: Head Pain Descriptors / Indicators: Headache Pain Frequency: Occasional Pain Onset: Awakened from sleep Patients Stated Pain Goal: 2 Pain Intervention(s): Medication (See eMAR) Multiple Pain Sites: No   See Function Navigator for Current Functional Status.   Therapy/Group: Individual Therapy  [name] 07/04/2017, 12:10 PM

## 2017-07-04 NOTE — Progress Notes (Signed)
Rn notified MD, Naaman Plummer 10-23 of pt urine having sediment present and cloudy with no odor present. No new orders at this time.

## 2017-07-05 ENCOUNTER — Inpatient Hospital Stay (HOSPITAL_COMMUNITY): Payer: Self-pay | Admitting: Physical Therapy

## 2017-07-05 ENCOUNTER — Inpatient Hospital Stay (HOSPITAL_COMMUNITY): Payer: Self-pay

## 2017-07-05 ENCOUNTER — Inpatient Hospital Stay (HOSPITAL_COMMUNITY): Payer: BC Managed Care – PPO | Admitting: Speech Pathology

## 2017-07-05 NOTE — Progress Notes (Signed)
Speech Language Pathology Discharge Summary  Patient Details  Name: [name] MRN: [id number] Date of Birth: 05/14/1966  Today's Date: 07/05/2017 SLP Individual Time: 1000-1030 SLP Individual Time Calculation (min): 30 min   Skilled Therapeutic Interventions:  Skilled treatment session focused on completing education on pt progress and current functional cognitive abilities. SLP recommending d/c from skilled ST at this time with plan to revisit cognitive therapy at next venue of care when pt's functional mobility increased and functional independence can be increased.     Patient has met 6 of 6 long term goals.  Patient to discharge at overall Supervision;Min level.    Clinical Impression/Discharge Summary:   Pt with good progress over course of ST services. He currently tolerates regular diet with thin liquids and demonstrates functional cognitive abilities.   Care Partner:  Caregiver Able to Provide Assistance: Yes  Type of Caregiver Assistance: Cognitive;Physical  Recommendation:  Skilled Nursing facility;24 hour supervision/assistance      Equipment:     Reasons for discharge: Treatment goals met   Patient/Family Agrees with Progress Made and Goals Achieved: Yes   Function:  Eating Eating   Modified Consistency Diet: No Eating Assist Level: Set up assist for;Helper feeds patient;Helper brings food to mouth   Eating Set Up Assist For: Opening containers Helper Scoops Food on Utensil: Every scoop Helper Brings Food to Mouth: Every scoop   Cognition Comprehension Comprehension assist level: Understands complex 90% of the time/cues 10% of the time  Expression   Expression assist level: Expresses complex 90% of the time/cues < 10% of the time  Social Interaction Social Interaction assist level: Interacts appropriately 90% of the time - Needs monitoring or encouragement for participation or interaction.  Problem Solving Problem solving assist level: Solves  complex 90% of the time/cues < 10% of the time  Memory Memory assist level: Recognizes or recalls 90% of the time/requires cueing < 10% of the time     07/05/2017, 10:04 AM    

## 2017-07-05 NOTE — Progress Notes (Signed)
[place] PHYSICAL MEDICINE & REHABILITATION     PROGRESS NOTE    Subjective/Complaints: No new issues. Anxious to walk and put weight on left foot  ROS: pt denies nausea, vomiting, diarrhea, cough, shortness of breath or chest pain    Objective: Vital Signs: Blood pressure 120/79, pulse 87, temperature 98.1 F (36.7 C), temperature source Oral, resp. rate 16, height 5\' 6"  (1.676 m), weight 87.2 kg (192 lb 3.9 oz), SpO2 98 %. No results found. No results for input(s): WBC, HGB, HCT, PLT in the last 72 hours. No results for input(s): NA, K, CL, GLUCOSE, BUN, CREATININE, CALCIUM in the last 72 hours.  Invalid input(s): CO CBG (last 3)  No results for input(s): GLUCAP in the last 72 hours.  Wt Readings from Last 3 Encounters:  07/05/17 87.2 kg (192 lb 3.9 oz)  05/21/17 99 kg (218 lb 3.2 oz)  01/11/17 91.6 kg (202 lb)    Physical Exam:  Constitutional: He appears well-developed. + Distressed HENT: Normocephalic. Atraumatic. Eyes: Unable to move right eye laterally.  Neck:  Immobilized by Aspen collar  Cardiovascular: RRR without murmur. No JVD                     Respiratory: CTA Bilaterally without wheezes or rales. Normal effort          GI: BS +, non-tender, non-distended  Musculoskeletal: mild b/l LE edema Neurological:  Alert Motor  2 /5 prox to distal B/L UE's.  Left III, VI nerve weakness . III with some improvement Right VI deficit--no CN changes B/l LE 2+/5 HF. RLE 2-3/5 ADF/PF Skin: Skin is warmand dry.  Wounds essentially healed.    Psychiatric: pleasant  Assessment/Plan: 1. Functional deficits, tetraplegia secondary to TBI/cervical SCI which require 3+ hours per day of interdisciplinary therapy in a comprehensive inpatient rehab setting. Physiatrist is providing close team supervision and 24 hour management of active medical problems listed below. Physiatrist and rehab team continue to assess barriers to discharge/monitor patient progress toward  functional and medical goals.  Function:  Bathing Bathing position Bathing activity did not occur: N/A (Night Bath) Position: Bed  Bathing parts   Body parts bathed by helper: Right arm, Left arm, Chest, Abdomen, Front perineal area, Buttocks, Right upper leg, Left upper leg, Right lower leg, Left lower leg, Back  Bathing assist Assist Level: 2 helpers      Upper Body Dressing/Undressing Upper body dressing   What is the patient wearing?: Pull over shirt/dress     Pull over shirt/dress - Perfomed by patient: Thread/unthread right sleeve, Thread/unthread left sleeve Pull over shirt/dress - Perfomed by helper: Put head through opening, Pull shirt over trunk   Button up shirt - Perfomed by helper: Thread/unthread right sleeve, Thread/unthread left sleeve, Pull shirt around back, Button/unbutton shirt    Upper body assist Assist Level: 2 helpers      Lower Body Dressing/Undressing Lower body dressing   What is the patient wearing?: Pants       Pants- Performed by helper: Thread/unthread right pants leg, Thread/unthread left pants leg, Pull pants up/down                      Lower body assist Assist for lower body dressing: 2 Helpers      Toileting Toileting Toileting activity did not occur: No continent bowel/bladder event   Toileting steps completed by helper: Adjust clothing prior to toileting, Performs perineal hygiene, Adjust clothing after toileting  Toileting assist Assist level:  (totalA)   Transfers Chair/bed transfer Chair/bed transfer activity did not occur: N/A Chair/bed transfer method: Lateral scoot Chair/bed transfer assist level: Moderate assist (Pt 50 - 74%/lift or lower) Chair/bed transfer assistive device: Armrests, Sliding board Mechanical lift: Maximove   Locomotion Ambulation Ambulation activity did not occur: Safety/medical Editor, commissioning activity did not occur: Safety/medical concerns Type: Manual Max  wheelchair distance: 75 Assist Level: Touching or steadying assistance (Pt > 75%)  Cognition Comprehension Comprehension assist level: Understands complex 90% of the time/cues 10% of the time  Expression Expression assist level: Expresses complex 90% of the time/cues < 10% of the time  Social Interaction Social Interaction assist level: Interacts appropriately 90% of the time - Needs monitoring or encouragement for participation or interaction.  Problem Solving Problem solving assist level: Solves complex 90% of the time/cues < 10% of the time  Memory Memory assist level: Recognizes or recalls 90% of the time/requires cueing < 10% of the time   Medical Problem List and Plan: 1. Functional, cognitive and mobility deficitssecondary to Moderate TBI, C4 spinal cord injury -Cont CIR   -now WBAT RLE in boot --attempting some wb with therapies  -SNF search ongoing      2. DVT Prophylaxis/Anticoagulation: Pharmaceutical: Lovenox 3. Chronic neck pain/Pain Management: On Gabapentin bid   -changed hydrocodone to oxycodone for breakthrough pain  - fentanyl patch  68mcg/hr    -Headaches are primarly related to occipital-cervical fractures  -can tape/pad collar to prevent it from rubbing on neck    4. Mood: LCSW to follow for evaluation and support when appropriate.  5. Neuropsych: This patient is notcapable of making decisions on hisown behalf.  -continue deficits in memory/insight 6. Skin/Wound Care: Air mattress overlay. Maintain adequate nutritional and hydration status.  7. Fluids/Electrolytes/Nutrition: Monitor I/O.    -diet advanced to regular/thins.    -replacing potassium 9. Left periorbital cellulitis/ CN III/VI palsy:  -continue local care  -follow up per optho  -needs to use drops/ointment 10.Left femur fracture s/p IM nail: NWB for another 5-6 weeks from 06/20/17  -peristent left foot drop 11. Left open tibial fracture s/p I and D with ORIF: NWB--CAM boot.  11.  Left mandibular fracture s/p CR /tongue laceration:   -wires removed, hardware completely removed  -advancing diet as tolerated 12. Chemosis left eye: Continue Lacrilube every 3 hours till resolved per opthalmology.  -  optho recommends ongoing consvt mgt   - drops/lubricant left eye 13. Facial cellulitis: keflex was completed  15. Thrombocytosis: Likely reactive.    16. ABLA:   Hemoglobin 10.9 10/21    17. Abnormal LFTs: Question due to shocked liver.     58. H/o of MDD: Was on Trintellix 5 mg and Risperidone 5 mg bid at home--continue to hold for now.   -behavior reasonable at present 19. Urine retention/neurogenic bladder: foley out  -continue  I/O caths as he has no sense of bladder at this point  LOS (Days) 45 A FACE TO FACE EVALUATION WAS PERFORMED  Meredith Staggers, MD 07/05/2017 9:20 AM

## 2017-07-05 NOTE — Progress Notes (Addendum)
Pt A/O, no noted distress. Pt continues to be obsessed with being cath. Pt is fixated with the time and in/out cath. He calls before times and note he feels full at one occasion he was scanned less than 300. When his wife arrived he ask that she give him some water, so he could be cath. Later, tech in/out cathed him. Educated pt, "that he when he was over 350, he is to be cath." Tech wanted to awaken pt @ 0400 to scan, educated the tech not to awake to scan pt resulted to the order and his fixation of time and cath. Please carefully read the order, so each staff member can be on the same page. Educated pt on staff will be on exactly time resulted in there is other pts on the unit. Please note there is a difference in patient's behavior when a male staff is present during cath, than with two females. Staff will continue to assist, monitor, and maintain safety.

## 2017-07-05 NOTE — Progress Notes (Signed)
Physical Therapy Session Note  Patient Details  Name: [name] MRN: [id number] Date of Birth: 03/27/1966  Today's Date: 07/05/2017 PT Individual Time: 0900-0930 and 1430-1530 PT Individual Time Calculation (min): 30 min and 60 min (total 90 min)   Short Term Goals: Week 7:  PT Short Term Goal 1 (Week 7): Pt will demonstrate rolling R/L consistent modA with bed features  PT Short Term Goal 2 (Week 7): Pt will demonstrate supine <>sit with modA PT Short Term Goal 3 (Week 7): Pt will perform sit <>stand modA maintaining NWB LLE PT Short Term Goal 4 (Week 7): Pt will propel w/c x100' with BUE and minA  Skilled Therapeutic Interventions/Progress Updates: Tx 1: Pt received seated in w/c, denies pain but reporting significant fatigue stating he did not sleep well last night. Pt declines leaving room to participate in therapy, then declines seated LE/UE activities in room. Requests to return to bed and agreeable to PROM UEs/LEs. Lateral scoot transfer to bed with transfer board and modA. TotalA sit >supine. PROM to BLE glutes, hamstrings, hip IR/ER, UEs shoulder flexion, external rotation, abduction. Pt engaged in music therapy during session which appeared to help reduce pain during PROM. Remained supine in bed, all needs in reach.   Tx 2: Pt received seated in w/c, denies pain and agreeable to treatment. Sit <>stand in parallel bars x5 reps with maxA; assist to maintain LLE NWB, and pt able to perform AAROM hip/knee flexion to progress LLE forward/backward to simulate taking a step. Transfer w/c <>mat table maxA with transfer board; increased assist d/t pt fatigue. Trialed standing with standard walker d/t pt request for planned failure; pt unable to clear hips from mat. Sit >stand x2 from elevated mat table with platform walker with increased success; able to achieve full standing and maintain mod/maxA d/t posterior lean 10-20 sec. Assist again to maintain NWB LLE. Returned to room totalA;  remained seated in w/c at end of session, all needs in reach. Made up 30 min from AM session.      Therapy Documentation Precautions:  Precautions Precautions: Fall, Cervical Required Braces or Orthoses: Cervical Brace Cervical Brace: Hard collar, At all times Other Brace/Splint: cam boot R LE; cam boot LLE, B resting hand splints for night use Restrictions Weight Bearing Restrictions: Yes RLE Weight Bearing: Weight bearing as tolerated LLE Weight Bearing: Non weight bearing   See Function Navigator for Current Functional Status.   Therapy/Group: Individual Therapy  [name] 07/05/2017, 9:58 AM

## 2017-07-05 NOTE — Progress Notes (Signed)
Occupational Therapy Session Note  Patient Details  Name: [name] MRN: [id number] Date of Birth: August 09, 1966  Today's Date: 07/05/2017 OT Individual Time: 0700-0800 OT Individual Time Calculation (min): 60 min    Short Term Goals: Week 6:  OT Short Term Goal 1 (Week 6): Pt will be able to use slide board with max A to wide drop arm BSC. OT Short Term Goal 2 (Week 6): Pt will be able to lateral lean on BSC with min A to allow caregivers to manage clothing and clean up.    Skilled Therapeutic Interventions/Progress Updates:    Pt resting in bed upon arrival.  Focus on bed mobility, dressing, functional transfers, grooming tasks, directing care, and safety awareness.  Pt initiates rolling by hooking ipsilateral UE on bed rail and rasing contralateral knee.  Pt requires assistance moving contralateral UE across body and rolling hips.  Pt is able to move BLE off edge of bed in preparation for slide board transfer but requires assistance with scooting hips forward and pushing up from sidelying.  Pt is able to thread BUE into sleeves while seated EOB but requires assistance pulling over head and pulling over trunk. Pt requires assistance initiating slide board transfer but completes transfer with min A.  Pt requires assistance repositioning in w/c.  Pt requires assistance supporting elbow when washing his face in w/c.    Therapy Documentation Precautions:  Precautions Precautions: Fall, Cervical Required Braces or Orthoses: Cervical Brace Cervical Brace: Hard collar, At all times Other Brace/Splint: cam boot R LE; cam boot LLE, B resting hand splints for night use Restrictions Weight Bearing Restrictions: Yes RLE Weight Bearing: Weight bearing as tolerated LLE Weight Bearing: Non weight bearing Pain: Pain Assessment Pain Assessment: No/denies pain when at rest Pt c/o R ankle pain when placed in boot.  Ace wrap reapplied and pt reported relief  See Function Navigator for Current  Functional Status.   Therapy/Group: Individual Therapy  Leroy Libman 07/05/2017, 8:35 AM

## 2017-07-05 NOTE — Progress Notes (Signed)
Occupational Therapy Note  Patient Details  Name: [name] MRN: [id number] Date of Birth: 05-29-1966  Today's Date: 07/05/2017 OT Individual Time: 1315-1430 OT Individual Time Calculation (min): 75 min   Pt initially denies pain but c/o HA during session; RN aware Individual Therapy  Pt eating lunch in bed upon arrival.  Upon completion of lunch pt initiated moving BLE off edge of bed in preparation for sitting EOB and slide board transfer to w/c.  Pt required assistance moving RLE off edge of bed and scooting to EOB. Pt remained sitting EOB at supervision level.  Pt required assistance for lateral lean to facilitate placement of slide board.  Pt was able to push through LUE to sitting position.  Pt performed slide board transfer with min A and required assistance for repositioning in w/c.  Pt transitioned to day room and engaged in table activities with focus on grasp and release of large pegs and sponge cubes with LUE/hand.  Pt completed tasks with elbow supported on table. Pt returned to room and remained in w/c with all needs within reach.    [name] [facility] 07/05/2017, 2:43 PM

## 2017-07-06 ENCOUNTER — Inpatient Hospital Stay (HOSPITAL_COMMUNITY): Payer: Self-pay | Admitting: Physical Therapy

## 2017-07-06 NOTE — Progress Notes (Signed)
[place] PHYSICAL MEDICINE & REHABILITATION     PROGRESS NOTE    Subjective/Complaints: Some tenderness in right foot. No other new problems  ROS: pt denies nausea, vomiting, diarrhea, cough, shortness of breath or chest pain    Objective: Vital Signs: Blood pressure 120/68, pulse 83, temperature 98.5 F (36.9 C), temperature source Oral, resp. rate 16, height 5\' 6"  (1.676 m), weight 87.5 kg (192 lb 14.4 oz), SpO2 97 %. No results found. No results for input(s): WBC, HGB, HCT, PLT in the last 72 hours. No results for input(s): NA, K, CL, GLUCOSE, BUN, CREATININE, CALCIUM in the last 72 hours.  Invalid input(s): CO CBG (last 3)  No results for input(s): GLUCAP in the last 72 hours.  Wt Readings from Last 3 Encounters:  07/06/17 87.5 kg (192 lb 14.4 oz)  05/21/17 99 kg (218 lb 3.2 oz)  01/11/17 91.6 kg (202 lb)    Physical Exam:  Constitutional: He appears well-developed. + Distressed HENT: Normocephalic. Atraumatic. Eyes: Unable to move right eye laterally.  Neck:  Immobilized by Aspen collar  Cardiovascular: RRR without murmur. No JVD                      Respiratory: CTA Bilaterally without wheezes or rales. Normal effort          GI: BS +, non-tender, non-distended  Musculoskeletal: mild b/l LE edema Neurological:  Alert Motor  2 /5 prox to distal B/L UE's.  Left III, VI nerve weakness . III with some improvement Right VI deficit--no CN changes B/l LE 2+/5 HF. RLE 2-3/5 ADF/PF Skin: Skin is warmand dry.  Wounds essentially healed.    Psychiatric: pleasant  Assessment/Plan: 1. Functional deficits, tetraplegia secondary to TBI/cervical SCI which require 3+ hours per day of interdisciplinary therapy in a comprehensive inpatient rehab setting. Physiatrist is providing close team supervision and 24 hour management of active medical problems listed below. Physiatrist and rehab team continue to assess barriers to discharge/monitor patient progress toward functional  and medical goals.  Function:  Bathing Bathing position Bathing activity did not occur: N/A (Night Bath) Position: Bed  Bathing parts   Body parts bathed by helper: Right arm, Left arm, Chest, Abdomen, Front perineal area, Buttocks, Right upper leg, Left upper leg, Right lower leg, Left lower leg, Back  Bathing assist Assist Level: 2 helpers      Upper Body Dressing/Undressing Upper body dressing   What is the patient wearing?: Pull over shirt/dress     Pull over shirt/dress - Perfomed by patient: Thread/unthread right sleeve, Thread/unthread left sleeve Pull over shirt/dress - Perfomed by helper: Put head through opening, Pull shirt over trunk   Button up shirt - Perfomed by helper: Thread/unthread right sleeve, Thread/unthread left sleeve, Pull shirt around back, Button/unbutton shirt    Upper body assist Assist Level: 2 helpers      Lower Body Dressing/Undressing Lower body dressing   What is the patient wearing?: Pants       Pants- Performed by helper: Thread/unthread right pants leg, Thread/unthread left pants leg, Pull pants up/down                      Lower body assist Assist for lower body dressing: 2 Helpers      Toileting Toileting Toileting activity did not occur: No continent bowel/bladder event   Toileting steps completed by helper: Adjust clothing prior to toileting, Performs perineal hygiene, Adjust clothing after toileting    Toileting assist  Assist level:  (totalA)   Transfers Chair/bed transfer Chair/bed transfer activity did not occur: N/A Chair/bed transfer method: Lateral scoot Chair/bed transfer assist level: Moderate assist (Pt 50 - 74%/lift or lower) Chair/bed transfer assistive device: Armrests, Sliding board Mechanical lift: Maximove   Locomotion Ambulation Ambulation activity did not occur: Safety/medical Editor, commissioning activity did not occur: Safety/medical concerns Type: Manual Max wheelchair  distance: 75 Assist Level: Touching or steadying assistance (Pt > 75%)  Cognition Comprehension Comprehension assist level: Understands complex 90% of the time/cues 10% of the time  Expression Expression assist level: Expresses complex 90% of the time/cues < 10% of the time  Social Interaction Social Interaction assist level: Interacts appropriately 90% of the time - Needs monitoring or encouragement for participation or interaction.  Problem Solving Problem solving assist level: Solves complex 90% of the time/cues < 10% of the time  Memory Memory assist level: Recognizes or recalls 90% of the time/requires cueing < 10% of the time   Medical Problem List and Plan: 1. Functional, cognitive and mobility deficitssecondary to Moderate TBI, C4 spinal cord injury -Cont CIR   -now WBAT RLE in boot --attempting some wb with therapies  -SNF search in process      2. DVT Prophylaxis/Anticoagulation: Pharmaceutical: Lovenox 3. Chronic neck pain/Pain Management: On Gabapentin bid   -changed hydrocodone to oxycodone for breakthrough pain  - fentanyl patch  3mcg/hr    -Headaches are primarly related to occipital-cervical fractures  -can tape/pad collar to prevent it from rubbing on neck    4. Mood: LCSW to follow for evaluation and support when appropriate.  5. Neuropsych: This patient is notcapable of making decisions on hisown behalf.  -continue deficits in memory/insight 6. Skin/Wound Care: Air mattress overlay. Maintain adequate nutritional and hydration status.  7. Fluids/Electrolytes/Nutrition: Monitor I/O.    -diet advanced to regular/thins.    -replacing potassium 9. Left periorbital cellulitis/ CN III/VI palsy:  -continue local care  -follow up per optho  -needs to use drops/ointment 10.Left femur fracture s/p IM nail: NWB for another 5-6 weeks from 06/20/17  -peristent left foot drop 11. Left open tibial fracture s/p I and D with ORIF: NWB--CAM boot.  11. Left  mandibular fracture s/p CR /tongue laceration:   -wires removed, hardware completely removed  -advancing diet as tolerated 12. Chemosis left eye: Continue Lacrilube every 3 hours till resolved per opthalmology.  -  optho recommends ongoing consvt mgt   - drops/lubricant left eye 13. Facial cellulitis: keflex was completed  15. Thrombocytosis: Likely reactive.    16. ABLA:   Hemoglobin 10.9 10/21    17. Abnormal LFTs: Question due to shocked liver.     29. H/o of MDD: Was on Trintellix 5 mg and Risperidone 5 mg bid at home--continue to hold for now.   -behavior reasonable at present 19. Urine retention/neurogenic bladder: foley out. He has no sense of bladder emptying  -continue  I/O caths   -if he begins to sense fullness and can initiate emptying, consider urecholine trial  LOS (Days) 46 [place] T, MD 07/06/2017 9:09 AM

## 2017-07-06 NOTE — Progress Notes (Signed)
Physical Therapy Session Note  Patient Details  Name: [name] MRN: [id number] Date of Birth: 05-05-1966  Today's Date: 07/06/2017 PT Individual Time: 9136-8599 PT Individual Time Calculation (min): 45 min   Short Term Goals:  Week 7:  PT Short Term Goal 1 (Week 7): Pt will demonstrate rolling R/L consistent modA with bed features  PT Short Term Goal 2 (Week 7): Pt will demonstrate supine <>sit with modA PT Short Term Goal 3 (Week 7): Pt will perform sit <>stand modA maintaining NWB LLE PT Short Term Goal 4 (Week 7): Pt will propel w/c x100' with BUE and minA  Skilled Therapeutic Interventions/Progress Updates:   Bed mobility with mod assist to come to sitting EOB. SB transfer to mod-max assist from PT to [facility]. Pt transported to rehab gym. Sit<>stand in parallel bars x 3 with up to 1 min in standing. Max assist to come up upright position and to maintain NWB through the LLE. Pt returned to room and performed SB transfer to bed with mod assist and heavy use of bed rails. Sit>supine completed with mod assist from PT. Pt left supine in bed with call bell in reach and all needs met.       Therapy Documentation Precautions:  Precautions Precautions: Fall, Cervical Required Braces or Orthoses: Cervical Brace Cervical Brace: Hard collar, At all times Other Brace/Splint: cam boot R LE; cam boot LLE, B resting hand splints for night use Restrictions Weight Bearing Restrictions: Yes RLE Weight Bearing: Weight bearing as tolerated LLE Weight Bearing: Non weight bearing Pain: Pain Assessment Pain Score: 3    See Function Navigator for Current Functional Status.   Therapy/Group: Individual Therapy  Lorie Phenix 07/06/2017, 5:17 PM

## 2017-07-07 NOTE — Progress Notes (Signed)
[facility] PHYSICAL MEDICINE & REHABILITATION     PROGRESS NOTE    Subjective/Complaints: Intermittent pain complaints. Slept very well  ROS: pt denies nausea, vomiting, diarrhea, cough, shortness of breath or chest pain    Objective: Vital Signs: Blood pressure 108/69, pulse 74, temperature 99.4 F (37.4 C), temperature source Axillary, resp. rate 18, height 5\' 6"  (1.676 m), weight 89.4 kg (197 lb), SpO2 98 %. No results found. No results for input(s): WBC, HGB, HCT, PLT in the last 72 hours. No results for input(s): NA, K, CL, GLUCOSE, BUN, CREATININE, CALCIUM in the last 72 hours.  Invalid input(s): CO CBG (last 3)  No results for input(s): GLUCAP in the last 72 hours.  Wt Readings from Last 3 Encounters:  07/07/17 89.4 kg (197 lb)  05/21/17 99 kg (218 lb 3.2 oz)  01/11/17 91.6 kg (202 lb)    Physical Exam:  Constitutional: He appears well-developed. + Distressed HENT: Normocephalic. Atraumatic. Eyes: Unable to move right eye laterally.  Neck:  Immobilized by Aspen collar  Cardiovascular: RRR without murmur. No JVD                      Respiratory: CTA Bilaterally without wheezes or rales. Normal effort       GI: BS +, non-tender, non-distended  Musculoskeletal: mild b/l LE edema Neurological:  Alert Motor  2 /5 prox to distal B/L UE's.  Left III, VI nerve weakness . III with some improvement Right VI deficit--no CN changes B/l LE 2+/5 HF. RLE 2-3/5 ADF/PF Skin: Skin is warmand dry.  scars.    Psychiatric: pleasant  Assessment/Plan: 1. Functional deficits, tetraplegia secondary to TBI/cervical SCI which require 3+ hours per day of interdisciplinary therapy in a comprehensive inpatient rehab setting. Physiatrist is providing close team supervision and 24 hour management of active medical problems listed below. Physiatrist and rehab team continue to assess barriers to discharge/monitor patient progress toward functional and medical  goals.  Function:  Bathing Bathing position Bathing activity did not occur: N/A (Night Bath) Position: Bed  Bathing parts   Body parts bathed by helper: Right arm, Left arm, Chest, Abdomen, Front perineal area, Buttocks, Right upper leg, Left upper leg, Right lower leg, Left lower leg, Back  Bathing assist Assist Level: 2 helpers      Upper Body Dressing/Undressing Upper body dressing   What is the patient wearing?: Pull over shirt/dress     Pull over shirt/dress - Perfomed by patient: Thread/unthread right sleeve, Thread/unthread left sleeve Pull over shirt/dress - Perfomed by helper: Put head through opening, Pull shirt over trunk   Button up shirt - Perfomed by helper: Thread/unthread right sleeve, Thread/unthread left sleeve, Pull shirt around back, Button/unbutton shirt    Upper body assist Assist Level: 2 helpers      Lower Body Dressing/Undressing Lower body dressing   What is the patient wearing?: Pants       Pants- Performed by helper: Thread/unthread right pants leg, Thread/unthread left pants leg, Pull pants up/down                      Lower body assist Assist for lower body dressing: 2 Helpers      Toileting Toileting Toileting activity did not occur: No continent bowel/bladder event   Toileting steps completed by helper: Adjust clothing prior to toileting, Performs perineal hygiene, Adjust clothing after toileting    Toileting assist Assist level:  (totalA)   Transfers Chair/bed transfer Chair/bed transfer activity  did not occur: N/A Chair/bed transfer method: Lateral scoot Chair/bed transfer assist level: Moderate assist (Pt 50 - 74%/lift or lower) Chair/bed transfer assistive device: Armrests, Sliding board Mechanical lift: Maximove   Locomotion Ambulation Ambulation activity did not occur: Safety/medical Editor, commissioning activity did not occur: Safety/medical concerns Type: Manual Max wheelchair distance:  75 Assist Level: Touching or steadying assistance (Pt > 75%)  Cognition Comprehension Comprehension assist level: Understands complex 90% of the time/cues 10% of the time  Expression Expression assist level: Expresses complex 90% of the time/cues < 10% of the time  Social Interaction Social Interaction assist level: Interacts appropriately 90% of the time - Needs monitoring or encouragement for participation or interaction.  Problem Solving Problem solving assist level: Solves complex 90% of the time/cues < 10% of the time  Memory Memory assist level: Recognizes or recalls 90% of the time/requires cueing < 10% of the time   Medical Problem List and Plan: 1. Functional, cognitive and mobility deficitssecondary to Moderate TBI, C4 spinal cord injury -Cont CIR   -now WBAT RLE in boot --attempting some wb with therapies  -SNF search in process      2. DVT Prophylaxis/Anticoagulation: Pharmaceutical: Lovenox 3. Chronic neck pain/Pain Management: On Gabapentin bid   -changed hydrocodone to oxycodone for breakthrough pain  - fentanyl patch  55mcg/hr    -Headaches are primarly related to occipital-cervical fractures  -can tape/pad collar to prevent it from rubbing on neck    4. Mood: LCSW to follow for evaluation and support when appropriate.  5. Neuropsych: This patient is notcapable of making decisions on hisown behalf.  -continue deficits in memory/insight 6. Skin/Wound Care: Air mattress overlay. Maintain adequate nutritional and hydration status.  7. Fluids/Electrolytes/Nutrition: Monitor I/O.    -diet advanced to regular/thins.    -replacing potassium 9. Left periorbital cellulitis/ CN III/VI palsy:  -continue local care  -follow up per optho  -needs to use drops/ointment 10.Left femur fracture s/p IM nail: NWB for another 5-6 weeks from 06/20/17  -peristent left foot drop 11. Left open tibial fracture s/p I and D with ORIF: NWB--CAM boot.  11. Left mandibular  fracture s/p CR /tongue laceration:   -wires removed, hardware completely removed  -advancing diet as tolerated 12. Chemosis left eye: Continue Lacrilube every 3 hours till resolved per opthalmology.  -  optho recommends ongoing consvt mgt   - drops/lubricant left eye 13. Facial cellulitis: keflex was completed  15. Thrombocytosis: Likely reactive.    16. ABLA:   Hemoglobin 10.9 10/21    17. Abnormal LFTs: Question due to shocked liver.     62. H/o of MDD: Was on Trintellix 5 mg and Risperidone 5 mg bid at home--continue to hold for now.   -behavior reasonable at present 19. Urine retention/neurogenic bladder: foley out. He has no sense of bladder emptying  -continue  I/O caths   -if he begins to sense fullness and can initiate emptying, consider urecholine trial  LOS (Days) 47 Jakes [place] T, MD 07/07/2017 9:29 AM

## 2017-07-08 ENCOUNTER — Inpatient Hospital Stay (HOSPITAL_COMMUNITY): Payer: Self-pay | Admitting: Physical Therapy

## 2017-07-08 ENCOUNTER — Inpatient Hospital Stay (HOSPITAL_COMMUNITY): Payer: Self-pay

## 2017-07-08 NOTE — Progress Notes (Addendum)
Patient demanded to have right boot and ace bandage taken off. He complained of pain in his ankle. He also demanded the tape he said he had placed around his ankle be taken off as well. Patient was irate and cursing at nursing staff. Gave patient prn oxycodone 5 mg. Patient then requested to put ace bandage back on, at a lower level closer to the ankle and reapply the boot. Patient apologized for his behavior and cursing at staff.

## 2017-07-08 NOTE — Progress Notes (Addendum)
Social Work Patient ID: Tayshaun Kroh, male   DOB: 03-09-1966, 51 y.o.   MRN: [id number]   Have spoken with pt's wife, [name], today to explain that I have not had any SNF offers in this and surrounding areas despite phone calls placed to discuss pt's case.  I have explained that I now need to pursue facilities much further away.  I have reached out to Dr. Reynaldo Minium to determine if there are any facilities that he could recommend that would consider taking pt with Medicaid and SSD apps pending.  Wife is very disappointed but understands that I must continue to search in wider area.  Will talk with pt when I have more information confirmed.    Tamarick Kovalcik, LCSW

## 2017-07-08 NOTE — Progress Notes (Signed)
Physical Therapy Session Note  Patient Details  Name: [name] MRN: [id number] Date of Birth: 1966/08/19  Today's Date: 07/08/2017 PT Individual Time: 1000-1100 and 1430-1510  PT Individual Time Calculation (min): 60 min and 40 min (total 100 min)  Short Term Goals: Week 7:  PT Short Term Goal 1 (Week 7): Pt will demonstrate rolling R/L consistent modA with bed features  PT Short Term Goal 2 (Week 7): Pt will demonstrate supine <>sit with modA PT Short Term Goal 3 (Week 7): Pt will perform sit <>stand modA maintaining NWB LLE PT Short Term Goal 4 (Week 7): Pt will propel w/c x100' with BUE and minA  Skilled Therapeutic Interventions/Progress Updates: Tx 1: Pt received supine in bed, denies pain and agreeable to treatment. Pt with new onset redness R eye; RN alerted and PA present during session to assess. Supine>sit with modA for pulling trunk to sitting. Transfer bed>w/c and w/c <>mat table during session min/modA; ongoing cues for head/hips relationship. W/c propulsion x100' with BUE and occasional minA; improving speed and coordination. Squat pivot totalA +2 w/c <>nustep for time/energy conservation and safety. Performed BUE/RLE nustep x5 min totalA for coordination and NMR. BUE scapular mobilizations superior/inferior and upward rotation coupled with AAROM shoulder flexion. Performed BUE AAROM machine for focus on self-feeding. Pt returned to room totalA; remained seated in w/c with handoff to NT.   Tx 2: pt received supine in bed, refusing to get out of bed at this time d/t pain and fatigue and states "I've already been up three times". Provided extensive education regarding out of bed schedule, importance of increasing out of bed tolerance to facilitate normal daily schedule and return to activity. Pt continues to decline getting out of bed due to back pain, has questions about TENS unit. Discussed with PA and therapy supervisor who recommend not using home tens unit at this time  as it has not been inspected by facilities; information relayed to pt. Performed BUE pec stretch, shoulder ER, flexion, BLE hip flexion, hip IR/ER, hamstring stretch. BLE SLR AAROM x1 reps. 3x10 crunches, ongoing cues for breathing during activity due to tendency to strain. Remained in bed at end of session, all needs in reach.      Therapy Documentation Precautions:  Precautions Precautions: Fall, Cervical Required Braces or Orthoses: Cervical Brace Cervical Brace: Hard collar, At all times Other Brace/Splint: cam boot R LE; cam boot LLE, B resting hand splints for night use Restrictions Weight Bearing Restrictions: Yes RLE Weight Bearing: Weight bearing as tolerated LLE Weight Bearing: Non weight bearing General: PT Amount of Missed Time (min): 20 Minutes PT Missed Treatment Reason: Patient fatigue;Pain  See Function Navigator for Current Functional Status.   Therapy/Group: Individual Therapy  [name] 07/08/2017, 11:00 AM

## 2017-07-08 NOTE — Progress Notes (Signed)
Placed eye drops as scheduled. Upon patient waking up RN notified from PT of increased redness to right eye. Patient denies pain at this time. [name] notified with finding. Dan to assess eye and make orders as needed.  Patient resting with no complaints of pain.

## 2017-07-08 NOTE — Progress Notes (Signed)
Occupational Therapy Note  Patient Details  Name: [name] MRN: [id number] Date of Birth: 1966-07-02  Today's Date: 07/08/2017 OT Individual Time: 1300-1400 OT Individual Time Calculation (min): 60 min   Pt denies pain Individual Therapy  Pt resting in bed upon arrival.  Pt required steady A for supine>sit EOB.  Pt performed slide board transfer to w/c with min A and mod verbal cues for sequencing and safety awareness.  Initial focus on self feeding with built-up utensil.  Pt provided support at elbow when feeding.  Pt required HOH A for scooping food on utensil.  Pt transitioned to day room and focused on BUE grasp with pegs.  Pt challenged to place and removed pegs in/from peg board.  Pt completed tasks with min A and more than a reasonable amount of time.  Pt returned to room and remained in w/c with student RN present.    [name] [facility] 07/08/2017, 2:47 PM

## 2017-07-08 NOTE — Progress Notes (Signed)
Occupational Therapy Session Note  Patient Details  Name: [name] MRN: [id number] Date of Birth: [birth year]  Today's Date: 07/08/2017 OT Individual Time: 0700-0800 OT Individual Time Calculation (min): 60 min    Short Term Goals: Week 6:  OT Short Term Goal 1 (Week 6): Pt will be able to use slide board with max A to wide drop arm BSC. OT Short Term Goal 2 (Week 6): Pt will be able to lateral lean on BSC with min A to allow caregivers to manage clothing and clean up.    Skilled Therapeutic Interventions/Progress Updates:    Pt resting in bed upon arrival.  Focus on bed mobility, functional transfers with slide board, BADL retraining, increased BUE use, and safety awareness to increase independence with BADLs.  Pt was able to move BLE off edge of bed but required min a for pushing up from supine to sitting position at EOB.  Pt maintained static sitting EOB at supervision level to don shirt.  Pt was able to thread BUE into sleeves and pull shirt over trunk.  Pt required assistance to don shirt over head.  Pt required min A for slide board transfer to w/c and min A for repositioning in w/c.  Pt washed his face with support provided at L elbow.  Pt remained in w/c with all needs within reach.    Therapy Documentation Precautions:  Precautions Precautions: Fall, Cervical Required Braces or Orthoses: Cervical Brace Cervical Brace: Hard collar, At all times Other Brace/Splint: cam boot R LE; cam boot LLE, B resting hand splints for night use Restrictions Weight Bearing Restrictions: Yes RLE Weight Bearing: Weight bearing as tolerated LLE Weight Bearing: Non weight bearing  Pain: Pt denies pain  See Function Navigator for Current Functional Status.   Therapy/Group: Individual Therapy  Leroy Libman 07/08/2017, 8:02 AM

## 2017-07-08 NOTE — Progress Notes (Signed)
[facility] PHYSICAL MEDICINE & REHABILITATION     PROGRESS NOTE    Subjective/Complaints: Right foot hurts when he's bearing weight on it. Anxious to get cervical collar off  ROS: pt denies nausea, vomiting, diarrhea, cough, shortness of breath or chest pain    Objective: Vital Signs: Blood pressure 119/80, pulse 74, temperature (!) 97.5 F (36.4 C), temperature source Axillary, resp. rate 18, height 5\' 6"  (1.676 m), weight 89.5 kg (197 lb 3.9 oz), SpO2 98 %. No results found. No results for input(s): WBC, HGB, HCT, PLT in the last 72 hours. No results for input(s): NA, K, CL, GLUCOSE, BUN, CREATININE, CALCIUM in the last 72 hours.  Invalid input(s): CO CBG (last 3)  No results for input(s): GLUCAP in the last 72 hours.  Wt Readings from Last 3 Encounters:  07/08/17 89.5 kg (197 lb 3.9 oz)  05/21/17 99 kg (218 lb 3.2 oz)  01/11/17 91.6 kg (202 lb)    Physical Exam:  Constitutional: He appears well-developed.  HENT: Normocephalic. Atraumatic. Eyes: lateral eye movement remains restricted Neck:  Immobilized by Aspen collar  Cardiovascular: RRR without murmur. No JVD                      Respiratory: CTA Bilaterally without wheezes or rales. Normal effort       GI: BS +, non-tender, non-distended  Musculoskeletal: mild b/l LE edema Neurological:  Alert Motor  2 /5 prox to distal B/L UE's.  Left III, VI nerve weakness . III with some improvement Right VI deficit--no CN changes B/l LE 2+/5 HF. RLE 2-3/5 ADF/PF Skin: Skin is warmand dry.  scars.    Psychiatric: pleasant  Assessment/Plan: 1. Functional deficits, tetraplegia secondary to TBI/cervical SCI which require 3+ hours per day of interdisciplinary therapy in a comprehensive inpatient rehab setting. Physiatrist is providing close team supervision and 24 hour management of active medical problems listed below. Physiatrist and rehab team continue to assess barriers to discharge/monitor patient progress toward  functional and medical goals.  Function:  Bathing Bathing position Bathing activity did not occur: N/A (Night Bath) Position: Bed  Bathing parts   Body parts bathed by helper: Right arm, Left arm, Chest, Abdomen, Front perineal area, Buttocks, Right upper leg, Left upper leg, Right lower leg, Left lower leg, Back  Bathing assist Assist Level: 2 helpers      Upper Body Dressing/Undressing Upper body dressing   What is the patient wearing?: Pull over shirt/dress     Pull over shirt/dress - Perfomed by patient: Thread/unthread right sleeve, Thread/unthread left sleeve Pull over shirt/dress - Perfomed by helper: Put head through opening, Pull shirt over trunk   Button up shirt - Perfomed by helper: Thread/unthread right sleeve, Thread/unthread left sleeve, Pull shirt around back, Button/unbutton shirt    Upper body assist Assist Level: 2 helpers      Lower Body Dressing/Undressing Lower body dressing   What is the patient wearing?: Pants       Pants- Performed by helper: Thread/unthread right pants leg, Thread/unthread left pants leg, Pull pants up/down                      Lower body assist Assist for lower body dressing: 2 Helpers      Toileting Toileting Toileting activity did not occur: No continent bowel/bladder event   Toileting steps completed by helper: Adjust clothing prior to toileting, Performs perineal hygiene, Adjust clothing after toileting    Toileting assist Assist  level:  (totalA)   Transfers Chair/bed transfer Chair/bed transfer activity did not occur: N/A Chair/bed transfer method: Lateral scoot Chair/bed transfer assist level: Moderate assist (Pt 50 - 74%/lift or lower) Chair/bed transfer assistive device: Armrests, Sliding board Mechanical lift: Maximove   Locomotion Ambulation Ambulation activity did not occur: Safety/medical Editor, commissioning activity did not occur: Safety/medical concerns Type: Manual Max  wheelchair distance: 75 Assist Level: Touching or steadying assistance (Pt > 75%)  Cognition Comprehension Comprehension assist level: Understands complex 90% of the time/cues 10% of the time  Expression Expression assist level: Expresses complex 90% of the time/cues < 10% of the time  Social Interaction Social Interaction assist level: Interacts appropriately 90% of the time - Needs monitoring or encouragement for participation or interaction.  Problem Solving Problem solving assist level: Solves complex 90% of the time/cues < 10% of the time  Memory Memory assist level: Recognizes or recalls 90% of the time/requires cueing < 10% of the time   Medical Problem List and Plan: 1. Functional, cognitive and mobility deficitssecondary to Moderate TBI, C4 spinal cord injury -Cont CIR   -now WBAT RLE in boot --attempting some wb with therapies  -SNF search in process    -will check with [facility] re: xrays/follow up for occiput-C4 fusion before he leaves here  2. DVT Prophylaxis/Anticoagulation: Pharmaceutical: Lovenox 3. Chronic neck pain/Pain Management: On Gabapentin bid   -changed hydrocodone to oxycodone for breakthrough pain  - fentanyl patch  45mcg/hr    -Headaches are primarly related to occipital-cervical fractures  -can tape/pad collar to prevent it from rubbing on neck   -can work up to pain tolerance RLE 4. Mood: LCSW to follow for evaluation and support when appropriate.  5. Neuropsych: This patient is notcapable of making decisions on hisown behalf.  -continue deficits in memory/insight 6. Skin/Wound Care: Air mattress overlay. Maintain adequate nutritional and hydration status.  7. Fluids/Electrolytes/Nutrition: Monitor I/O.    -diet advanced to regular/thins.    -replacing potassium 9. Left periorbital cellulitis/ CN III/VI palsy:  -continue local care  -follow up per optho  -needs to use drops/ointment 10.Left femur fracture s/p IM nail: NWB for another 5-6 weeks  from 06/20/17  -peristent left foot drop 11. Left open tibial fracture s/p I and D with ORIF: NWB--CAM boot.  11. Left mandibular fracture s/p CR /tongue laceration:   -wires removed, hardware completely removed  -advancing diet as tolerated 12. Chemosis left eye: Continue Lacrilube every 3 hours till resolved per opthalmology.  -  optho recommends ongoing consvt mgt   - drops/lubricant left eye 13. Facial cellulitis: keflex was completed  15. Thrombocytosis: Likely reactive.    16. ABLA:   Hemoglobin 10.9 10/21    17. Abnormal LFTs: Question due to shocked liver.     45. H/o of MDD: Was on Trintellix 5 mg and Risperidone 5 mg bid at home--continue to hold for now.   -behavior reasonable at present 19. Urine retention/neurogenic bladder: foley out. He has no sense of bladder emptying  -continue  I/O caths   -if he begins to sense fullness and can initiate emptying, consider urecholine trial  LOS (Days) 48 A FACE TO FACE EVALUATION WAS PERFORMED  Meredith Staggers, MD 07/08/2017 12:47 PM

## 2017-07-09 ENCOUNTER — Inpatient Hospital Stay (HOSPITAL_COMMUNITY): Payer: Self-pay | Admitting: Physical Therapy

## 2017-07-09 ENCOUNTER — Inpatient Hospital Stay (HOSPITAL_COMMUNITY): Payer: Self-pay

## 2017-07-09 ENCOUNTER — Inpatient Hospital Stay (HOSPITAL_COMMUNITY): Payer: BC Managed Care – PPO

## 2017-07-09 MED ORDER — BETHANECHOL CHLORIDE 25 MG PO TABS
25.0000 mg | ORAL_TABLET | Freq: Three times a day (TID) | ORAL | Status: DC
  Administered 2017-07-09 – 2017-07-12 (×10): 25 mg via ORAL
  Filled 2017-07-09 (×10): qty 1

## 2017-07-09 NOTE — Progress Notes (Addendum)
[facility]YSICAL [facility]ICINE & REHABILITATION     PROGRESS NOTE    Subjective/Complaints: Doing well. Right foot feeling better today. Having some urge to empty. Able to produce a few "drops" prior to being cathed. Concerned about blood around right eye  ROS: pt denies nausea, vomiting, diarrhea, cough, shortness of breath or chest pain    Objective: Vital Signs: Blood pressure 119/67, pulse 72, temperature 98.9 F (37.2 C), temperature source Oral, resp. rate 16, height 5\' 6"  (1.676 m), weight 89.6 kg (197 lb 7.8 oz), SpO2 99 %. No results found. No results for input(s): WBC, HGB, HCT, PLT in the last 72 hours. No results for input(s): NA, K, CL, GLUCOSE, BUN, CREATININE, CALCIUM in the last 72 hours.  Invalid input(s): CO CBG (last 3)  No results for input(s): GLUCAP in the last 72 hours.  Wt Readings from Last 3 Encounters:  07/09/17 89.6 kg (197 lb 7.8 oz)  05/21/17 99 kg (218 lb 3.2 oz)  01/11/17 91.6 kg (202 lb)    Physical Exam:  Constitutional: He appears well-developed.  HENT: Normocephalic. Atraumatic. Eyes: lateral eye movement remains restricted. ?new slceral hemorrhage OD, OS improving Neck:  Immobilized by Aspen collar  Cardiovascular: RRR without murmur. No JVD  Respiratory: CTA Bilaterally without wheezes or rales. Normal effort  GI: BS +, non-tender, non-distended  Musculoskeletal: mild b/l LE edema Neurological:  Alert Motor  2 /5 prox to distal B/L UE's.  Left III, VI nerve weakness . III with some improvement Right VI deficit--no CN changes B/l LE 2+/5 HF. RLE 2-3/5 ADF/PF Skin: Skin is warmand dry.  scars.    Psychiatric: pleasant  Assessment/Plan: 1. Functional deficits, tetraplegia secondary to TBI/cervical SCI which require 3+ hours per day of interdisciplinary therapy in a comprehensive inpatient rehab setting. Physiatrist is providing close team supervision and 24 hour management of active medical problems listed below. Physiatrist and  rehab team continue to assess barriers to discharge/monitor patient progress toward functional and medical goals.  Function:  Bathing Bathing position Bathing activity did not occur: N/A (Night Bath) Position: Bed  Bathing parts   Body parts bathed by helper: Right arm, Left arm, Chest, Abdomen, Front perineal area, Buttocks, Right upper leg, Left upper leg, Right lower leg, Left lower leg, Back  Bathing assist Assist Level: 2 helpers      Upper Body Dressing/Undressing Upper body dressing   What is the patient wearing?: Pull over shirt/dress     Pull over shirt/dress - Perfomed by patient: Thread/unthread right sleeve, Thread/unthread left sleeve Pull over shirt/dress - Perfomed by helper: Put head through opening, Pull shirt over trunk   Button up shirt - Perfomed by helper: Thread/unthread right sleeve, Thread/unthread left sleeve, Pull shirt around back, Button/unbutton shirt    Upper body assist Assist Level: 2 helpers      Lower Body Dressing/Undressing Lower body dressing   What is the patient wearing?: Pants       Pants- Performed by helper: Thread/unthread right pants leg, Thread/unthread left pants leg, Pull pants up/down                      Lower body assist Assist for lower body dressing: 2 Helpers      Toileting Toileting Toileting activity did not occur: No continent bowel/bladder event   Toileting steps completed by helper: Adjust clothing prior to toileting, Performs perineal hygiene, Adjust clothing after toileting    Toileting assist Assist level:  (totalA)   Transfers Chair/bed  transfer Chair/bed transfer activity did not occur: N/A Chair/bed transfer method: Lateral scoot Chair/bed transfer assist level: Moderate assist (Pt 50 - 74%/lift or lower) Chair/bed transfer assistive device: Armrests, Sliding board Mechanical lift: Maximove   Locomotion Ambulation Ambulation activity did not occur: Safety/medical Actor activity did not occur: Safety/medical concerns Type: Manual Max wheelchair distance: 75 Assist Level: Touching or steadying assistance (Pt > 75%)  Cognition Comprehension Comprehension assist level: Understands complex 90% of the time/cues 10% of the time  Expression Expression assist level: Expresses complex 90% of the time/cues < 10% of the time  Social Interaction Social Interaction assist level: Interacts appropriately 90% of the time - Needs monitoring or encouragement for participation or interaction.  Problem Solving Problem solving assist level: Solves complex 90% of the time/cues < 10% of the time  Memory Memory assist level: Recognizes or recalls 90% of the time/requires cueing < 10% of the time   Medical Problem List and Plan: 1. Functional, cognitive and mobility deficitssecondary to Moderate TBI, C4 spinal cord injury -Cont CIR   -now WBAT RLE in boot --attempting some wb with therapies  -SNF search in process    -have contacted [facility] re: xrays/follow up for occiput-C4 fusion before he leaves here  2. DVT Prophylaxis/Anticoagulation: Pharmaceutical: Lovenox 3. Chronic neck pain/Pain Management: On Gabapentin bid   -changed hydrocodone to oxycodone for breakthrough pain  - fentanyl patch  94mcg/hr    -Headaches are primarly related to occipital-cervical fractures  -can tape/pad collar to prevent it from rubbing on neck   -can work up to pain tolerance RLE 4. Mood: LCSW to follow for evaluation and support when appropriate.  5. Neuropsych: This patient is notcapable of making decisions on hisown behalf.  -continue deficits in memory/insight 6. Skin/Wound Care: Air mattress overlay. Maintain adequate nutritional and hydration status.  7. Fluids/Electrolytes/Nutrition: Monitor I/O.    -diet advanced to regular/thins.    -replacing potassium 9. Left periorbital cellulitis/ CN III/VI palsy:  -continue local care  -follow up per  optho  -needs to use drops/ointment 10.Left femur fracture s/p IM nail: NWB for another 5-6 weeks from 06/20/17  -peristent left foot drop 11. Left open tibial fracture s/p I and D with ORIF: NWB--CAM boot.  11. Left mandibular fracture s/p CR /tongue laceration:   -wires removed, hardware completely removed  -advancing diet as tolerated 12. Chemosis left eye: Continue Lacrilube every 3 hours till resolved per opthalmology.  -  optho recommends ongoing consvt mgt   - drops/lubricant left eye  -scleral hemorrhage OD, consvt mgt, no visual changes 13. Facial cellulitis: keflex was completed  15. Thrombocytosis: Likely reactive.    16. ABLA:   Hemoglobin 10.9 10/21    17. Abnormal LFTs: Question due to shocked liver.     85. H/o of MDD: Was on Trintellix 5 mg and Risperidone 5 mg bid at home--continue to hold for now.   -behavior reasonable at present 19. Urine retention/neurogenic bladder: foley out. Beginning to have some sense of emptying  -continue  I/O caths   -will initiate urecholine trial, 25mg  TID  LOS (Days) 49 A FACE TO FACE EVALUATION WAS PERFORMED  Alger Simons T, MD 07/09/2017 9:04 AM

## 2017-07-09 NOTE — Progress Notes (Signed)
Physical Therapy Session Note  Patient Details  Name: [name] MRN: [id number] Date of Birth: 21-Jun-1966  Today's Date: 07/09/2017 PT Individual Time: 1000-1100 and 1435-1530 PT Individual Time Calculation (min): 60 min and 55 min (total 115 min)  Short Term Goals: Week 7:  PT Short Term Goal 1 (Week 7): Pt will demonstrate rolling R/L consistent modA with bed features  PT Short Term Goal 2 (Week 7): Pt will demonstrate supine <>sit with modA PT Short Term Goal 3 (Week 7): Pt will perform sit <>stand modA maintaining NWB LLE PT Short Term Goal 4 (Week 7): Pt will propel w/c x100' with BUE and minA  Skilled Therapeutic Interventions/Progress Updates: Tx 1: Pt received seated on side of bed with RN present, attempting to use urinal; minA +2 for balance while doffing brief and moving shorts out of the way. Pt unsuccessful at attempting voiding. Lateral transfer bed>w/c and w/c <>mat table with transfer board and min/modA. Educated pt on goal of improving hip clearance over board to transition to squat pivot transfers as pt repeatedly asking why he needs to use board. Sit <>stand with eva walker with maxA +1 x4 trials, cues for anterior/R lateral weight shift for maintaining weight bearing centered over RLE. LLE maintained extended/propped to follow WB precautions. W/c propulsion x30' with minA; significantly slowed speed. Returned to bed with modA lateral scoot transfer. Remained supine in bed at end of session, RN alerted to pt request to perform catheter.   Tx 2: Pt received seated in w/c, c/o headache and RN present to administer medication. Educated pt on goal/purpose of trial with power chair to increase independence with mobility, pressure relief and reduce burden of care for pericare. Transfer standard w/c >power chair with transfer board and modA. Pt drove w/c throughout hallways with close S, ongoing cues for technique and safety, speed max gradually increased as pt increased  comfort with steering device. Discussed various options with power chairs to increase functional use, such as various steering devices. Returned to room and remained tilted back in w/c, power off and RN alerted to pt position and pt's request to have bladder scan as he has urinary urgency.      Therapy Documentation Precautions:  Precautions Precautions: Fall, Cervical Required Braces or Orthoses: Cervical Brace Cervical Brace: Hard collar, At all times Other Brace/Splint: cam boot R LE; cam boot LLE, B resting hand splints for night use Restrictions Weight Bearing Restrictions: Yes RLE Weight Bearing: Weight bearing as tolerated LLE Weight Bearing: Non weight bearing   See Function Navigator for Current Functional Status.   Therapy/Group: Individual Therapy  [name] 07/09/2017, 1:58 PM

## 2017-07-09 NOTE — Progress Notes (Signed)
Occupational Therapy Session Note  Patient Details  Name: [name] MRN: [id number] Date of Birth: 07-Oct-1965  Today's Date: 07/09/2017 OT Individual Time: 0700-0800 OT Individual Time Calculation (min): 60 min    Short Term Goals: Week 6:  OT Short Term Goal 1 (Week 6): Pt will be able to use slide board with max A to wide drop arm BSC. OT Short Term Goal 2 (Week 6): Pt will be able to lateral lean on BSC with min A to allow caregivers to manage clothing and clean up.    Skilled Therapeutic Interventions/Progress Updates:    Pt resting in bed upon arrival.  Focus on bed mobility, sitting balance, UB dressing tasks, grooming tasks, and safety awareness. Pt initiated rolling in bed to facilitate pulling pants over hips.  Pt able to raise R buttocks off bed with bridging through RLE to facilitate pulling pants over hips.  Pt required min A for supine>sit EOB to completed grooming tasks and UB dressing.  Pt able to thread BLE and pull shirt over trunk but required assistance placing shirt over head.  Pt requested to return to bed upon completion of dressing tasks.  Pt initiated bringing BLE onto bed but required assistance to complete tasks.  Pt remained in bed with all needs within reach.   Therapy Documentation Precautions:  Precautions Precautions: Fall, Cervical Required Braces or Orthoses: Cervical Brace Cervical Brace: Hard collar, At all times Other Brace/Splint: cam boot R LE; cam boot LLE, B resting hand splints for night use Restrictions Weight Bearing Restrictions: Yes RLE Weight Bearing: Weight bearing as tolerated LLE Weight Bearing: Non weight bearing   Pain: Pain Assessment Pain Assessment: No/denies pain  See Function Navigator for Current Functional Status.   Therapy/Group: Individual Therapy  Leroy Libman 07/09/2017, 11:54 AM

## 2017-07-09 NOTE — Progress Notes (Signed)
Occupational Therapy Note  Patient Details  Name: Miner Koral MRN: [id number] Date of Birth: Jan 15, 1966  Today's Date: 07/09/2017 OT Individual Time: 1300-1358 OT Individual Time Calculation (min): 73 min   Pt c/o B shoulder pain/discomfort; repositioned Individual Therapy  Pt resting in bed upon arrival.  Focus on bed mobility, slide board transfers, and BUE therapeutic activities with emphasis on [facility] and gross motor function.  Pt required min A for supine>sit EOB and maintained sitting balance, including lateral lean, in preparation for slide board transfer to w/c.  Pt transitioned to day room and engaged in table activities grasping, placing, and releasing small pegs.  Pt also used theraputty with focus on strengthening.  Pt remained in w/c with all needs within reach.    [name] [facility] 07/09/2017, 2:02 PM

## 2017-07-10 ENCOUNTER — Inpatient Hospital Stay (HOSPITAL_COMMUNITY): Payer: Self-pay

## 2017-07-10 ENCOUNTER — Inpatient Hospital Stay (HOSPITAL_COMMUNITY): Payer: BC Managed Care – PPO

## 2017-07-10 ENCOUNTER — Inpatient Hospital Stay (HOSPITAL_COMMUNITY): Payer: Self-pay | Admitting: Physical Therapy

## 2017-07-10 ENCOUNTER — Inpatient Hospital Stay (HOSPITAL_COMMUNITY): Payer: Self-pay | Admitting: Occupational Therapy

## 2017-07-10 NOTE — Progress Notes (Signed)
Social Work Patient ID: [name], male   DOB: 1966/09/08, 51 y.o.   MRN: [id number]   Have spoken with pt and wife today about several issues.  They are aware that I have been unable to secure any SNF facility bed for pt with his Medicaid pending.  They understand that I have been instructed by hospital administration to d/c patient home and that wife and family must coordinate a plan for providing 24/7 assistance to pt at home.   Wife is trying to coordinate support at home via her church family to have someone who can simply sit with pt and she will try to come home at lunch break and then will be home by late afternoon.  Unfortunately, wife has not been in her current job long enough to qualify for [employer].  She admits she is feeling overwhelmed by change on plans but is working on getting supports in place. We also discussed need for family training.  Wife is not able to get any time off this week so we have planned for family ed to take place on Saturday with target then for pt to d/c home on Monday via ambulance.   Wife is aware that she will need to work on getting ramp in place for the longer term needs.  Have reviewed DME needs which include a hoyer lift and hospital bed among other items.  Pt is to go through a formal w/c seating eval this afternoon for a power w/c.   Pt is very happy about the plan for d/c home but is aware that this is difficult for wife to cover the 24/7 needs.  Will stay in contact with wife this week to keep up with progress of the arrangements being made.  Tx team aware and agreeable with plan.  Continue to follow.  Mar Zettler, LCSW

## 2017-07-10 NOTE — Progress Notes (Signed)
Occupational Therapy Note  Patient Details  Name: [name] MRN: [id number] Date of Birth: [birth year]  Today's Date: 07/10/2017 OT Individual Time: 1300-1330 OT Individual Time Calculation (min): 30 min   Pt denies pain Individual Therapy  Pt engaged in power w/c mobility in simulated home environment.  Pt required min verbal cues for safety awareness when navigating in cluttered environment.  Pt required min verbal cues to recall correct use of controls.  Pt remained in w/c and handed off to PT in therapy gym.    [name] [facility] 07/10/2017, 3:12 PM

## 2017-07-10 NOTE — Progress Notes (Signed)
Patient refusing LLE brace most of this shift.
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
## 2017-07-10 NOTE — Progress Notes (Signed)
Physical Therapy Session Note  Patient Details  Name: [name] MRN: [id number] Date of Birth: [birth year]  Today's Date: 07/10/2017 PT Individual Time: 1330-1430 PT Individual Time Calculation (min): 60 min   Short Term Goals: Week 7:  PT Short Term Goal 1 (Week 7): Pt will demonstrate rolling R/L consistent modA with bed features  PT Short Term Goal 2 (Week 7): Pt will demonstrate supine <>sit with modA PT Short Term Goal 3 (Week 7): Pt will perform sit <>stand modA maintaining NWB LLE PT Short Term Goal 4 (Week 7): Pt will propel w/c x100' with BUE and minA  Skilled Therapeutic Interventions/Progress Updates: Pt received in w/c with handoff from OT; denies pain and agreeable to treatment. Pt participated actively in w/c evaluation, performing transfers at [facility] level with transfer board throughout session to transfer to new loaner w/c. Educated pt on goals of seating system, various options available to increase independence and reduce caregiver burden. Pt excited by prospect of new chair to use for family training and at d/c home. Discussed ramp options, and availability of w/c vendor to transport chair home initially. Handoff to nursing at end of session d/t urinary urgency and unsuccessful with use of urinal.      Therapy Documentation Precautions:  Precautions Precautions: Fall, Cervical Required Braces or Orthoses: Cervical Brace Cervical Brace: Hard collar, At all times Other Brace/Splint: cam boot R LE; cam boot LLE, B resting hand splints for night use Restrictions Weight Bearing Restrictions: Yes RLE Weight Bearing: Weight bearing as tolerated LLE Weight Bearing: Non weight bearing  See Function Navigator for Current Functional Status.   Therapy/Group: Individual Therapy  [name] 07/10/2017, 3:17 PM

## 2017-07-10 NOTE — Progress Notes (Signed)
Occupational Therapy Session Note  Patient Details  Name: [name] MRN: [id number] Date of Birth: 02/06/1966  Today's Date: 07/10/2017 OT Individual Time: 1105-1200 OT Individual Time Calculation (min): 55 min    Short Term Goals: Week 6:  OT Short Term Goal 1 (Week 6): Pt will be able to use slide board with max A to wide drop arm BSC. OT Short Term Goal 1 - Progress (Week 6): Met OT Short Term Goal 2 (Week 6): Pt will be able to lateral lean on BSC with min A to allow caregivers to manage clothing and clean up.   OT Short Term Goal 2 - Progress (Week 6): Met Week 7:  OT Short Term Goal 1 (Week 7): STG=LTG secondary to ELOS  Skilled Therapeutic Interventions/Progress Updates:    Pt is now WBAT on LLE. Pt directed this caregiver well for how to assist him with bed mobility and slide board transfer to power w/c. Pt completed transfer with mod A.  Once in chair pt used power mobility to transport to the gym. In gym, worked on A/arom exercises for his B shoulders, elbows and hands using upright sitting with arm positioned on high low table for pushing pulling exercises while grasping towel.  Repeated on other arm.  UE Ranger for increased sh flexion a/arom on BUE.  Pt tolerated exercises well.  Pt returned to room with all needs met.  Therapy Documentation Precautions:  Precautions Precautions: Fall, Cervical Required Braces or Orthoses: Cervical Brace Cervical Brace: Hard collar, At all times Other Brace/Splint: cam boot R LE; cam boot LLE, B resting hand splints for night use Restrictions Weight Bearing Restrictions: Yes RLE Weight Bearing: Weight bearing as tolerated LLE Weight Bearing: Non weight bearing    Pain: Pain Assessment Pain Assessment: No/denies pain ADL:   See Function Navigator for Current Functional Status.   Therapy/Group: Individual Therapy  [place] 07/10/2017, 1:10 PM

## 2017-07-10 NOTE — Progress Notes (Signed)
Occupational Therapy Session Note  Patient Details  Name: [name] MRN: [id number] Date of Birth: 1965/11/22  Today's Date: 07/10/2017 OT Individual Time: 0700-0800 OT Individual Time Calculation (min): 60 min    Short Term Goals: Week 6:  OT Short Term Goal 1 (Week 6): Pt will be able to use slide board with max A to wide drop arm BSC. OT Short Term Goal 2 (Week 6): Pt will be able to lateral lean on BSC with min A to allow caregivers to manage clothing and clean up.    Skilled Therapeutic Interventions/Progress Updates:    Focus on bed mobility, sitting balance EOB, functinoal transfers, UB dressing, grooming tasks, directing care, and safety awareness to increase independence with BADL's and decrease burden of care.  Pt requires min A for rolling in bed and sidelying>sit EOB for UB dressing tasks.  Pt performs slide board transfers with min A. Pt completes grooming tasks with min A/support at pt's elbow.  Pt directs care appropriately but requires min verbal cues for safety awareness during transfers. Pt remained in w/c with all needs within reach.   Therapy Documentation Precautions:  Precautions Precautions: Fall, Cervical Required Braces or Orthoses: Cervical Brace Cervical Brace: Hard collar, At all times Other Brace/Splint: cam boot R LE; cam boot LLE, B resting hand splints for night use Restrictions Weight Bearing Restrictions: Yes RLE Weight Bearing: Weight bearing as tolerated LLE Weight Bearing: Non weight bearing   Pain:  Pt denies pain  See Function Navigator for Current Functional Status.   Therapy/Group: Individual Therapy  Leroy Libman 07/10/2017, 9:57 AM

## 2017-07-10 NOTE — Progress Notes (Addendum)
[facility] PHYSICAL MEDICINE & REHABILITATION     PROGRESS NOTE    Subjective/Complaints: No new complaints. Still anxious about collar. Sitting EOB with OT  ROS: pt denies nausea, vomiting, diarrhea, cough, shortness of breath or chest pain    Objective: Vital Signs: Blood pressure 109/60, pulse 79, temperature 99 F (37.2 C), temperature source Oral, resp. rate 16, height 5\' 6"  (8.413 m), weight 90 kg (198 lb 6.6 oz), SpO2 98 %. Dg Tibia/fibula Left  Result Date: 07/09/2017 CLINICAL DATA:  ORIF of comminuted tibia and fibula fracture. EXAM: LEFT TIBIA AND FIBULA - 2 VIEW COMPARISON:  Tibia and fibula radiographs 06/09/2017 FINDINGS: There is continued healing with progressive callus formation at the proximal and distal aspects of the comminuted tibia fracture. Positioning fragments is unchanged. ORIF is stable. There is progressive demineralization of the distal tibia and fibula. IMPRESSION: 1. Continued healing of a comminuted tibia fracture status post ORIF. 2. Progressive demineralization. Electronically Signed   By: San Morelle M.D.   On: 07/09/2017 16:26   Dg Ankle Complete Left  Result Date: 07/09/2017 CLINICAL DATA:  ORIF of comminuted distal tibia and fibular fractures. EXAM: LEFT ANKLE COMPLETE - 3+ VIEW COMPARISON:  Right ankle radiographs 06/09/2017. FINDINGS: The patient is status post ORIF. Intra medial plate screw fixation is in place. There is further demineralization. Callus formation is noted across the distal aspects of the fracture. There is also interval callus formation across the fibular fracture. IMPRESSION: 1. Evidence of healing with progressive callus formation across distal aspects of the tibia and fibular fractures. 2. Progressive demineralization. Electronically Signed   By: San Morelle M.D.   On: 07/09/2017 16:24   No results for input(s): WBC, HGB, HCT, PLT in the last 72 hours. No results for input(s): NA, K, CL, GLUCOSE, BUN, CREATININE,  CALCIUM in the last 72 hours.  Invalid input(s): CO CBG (last 3)  No results for input(s): GLUCAP in the last 72 hours.  Wt Readings from Last 3 Encounters:  07/10/17 90 kg (198 lb 6.6 oz)  05/21/17 99 kg (218 lb 3.2 oz)  01/11/17 91.6 kg (202 lb)    Physical Exam:  Constitutional: He appears well-developed.  HENT: Normocephalic. Atraumatic. Eyes: lateral eye movement remains restricted. ?new slceral hemorrhage OD, OS improving Neck:  Immobilized by Aspen collar  Cardiovascular: RRR Respiratory: CTA B GI: BS +, non-tender, non-distended  Musculoskeletal: minimal edema Neurological:  Alert Motor  2 to 2+/5 prox to distal B/L UE's. Left a little stronger than right Left III, VI nerve weakness . III with some improvement Right VI deficit--no CN changes B/l LE 2+/5 HF. RLE 2-3/5 ADF/PF Skin: Skin is warmand dry.  scars.    Psychiatric: pleasant  Assessment/Plan: 1. Functional deficits, tetraplegia secondary to TBI/cervical SCI which require 3+ hours per day of interdisciplinary therapy in a comprehensive inpatient rehab setting. Physiatrist is providing close team supervision and 24 hour management of active medical problems listed below. Physiatrist and rehab team continue to assess barriers to discharge/monitor patient progress toward functional and medical goals.  Function:  Bathing Bathing position Bathing activity did not occur: N/A (Night Bath) Position: Bed  Bathing parts   Body parts bathed by helper: Right arm, Left arm, Chest, Abdomen, Front perineal area, Buttocks, Right upper leg, Left upper leg, Right lower leg, Left lower leg, Back  Bathing assist Assist Level: 2 helpers      Upper Body Dressing/Undressing Upper body dressing   What is the patient wearing?: Pull over shirt/dress  Pull over shirt/dress - Perfomed by patient: Thread/unthread right sleeve, Thread/unthread left sleeve, Pull shirt over trunk Pull over shirt/dress - Perfomed by helper: Put  head through opening   Button up shirt - Perfomed by helper: Thread/unthread right sleeve, Thread/unthread left sleeve, Pull shirt around back, Button/unbutton shirt    Upper body assist Assist Level: 2 helpers      Lower Body Dressing/Undressing Lower body dressing   What is the patient wearing?: Pants       Pants- Performed by helper: Thread/unthread right pants leg, Thread/unthread left pants leg, Pull pants up/down                      Lower body assist Assist for lower body dressing: 2 Helpers      Toileting Toileting Toileting activity did not occur: No continent bowel/bladder event   Toileting steps completed by helper: Adjust clothing prior to toileting, Performs perineal hygiene, Adjust clothing after toileting    Toileting assist Assist level:  (totalA)   Transfers Chair/bed transfer Chair/bed transfer activity did not occur: N/A Chair/bed transfer method: Lateral scoot Chair/bed transfer assist level: Moderate assist (Pt 50 - 74%/lift or lower) Chair/bed transfer assistive device: Armrests, Sliding board Mechanical lift: Maximove   Locomotion Ambulation Ambulation activity did not occur: Safety/medical Editor, commissioning activity did not occur: Safety/medical concerns Type: Manual Max wheelchair distance: 75 Assist Level: Touching or steadying assistance (Pt > 75%)  Cognition Comprehension Comprehension assist level: Understands complex 90% of the time/cues 10% of the time  Expression Expression assist level: Expresses complex 90% of the time/cues < 10% of the time  Social Interaction Social Interaction assist level: Interacts appropriately 90% of the time - Needs monitoring or encouragement for participation or interaction.  Problem Solving Problem solving assist level: Solves complex 90% of the time/cues < 10% of the time  Memory Memory assist level: Recognizes or recalls 90% of the time/requires cueing < 10% of the time    Medical Problem List and Plan: 1. Functional, cognitive and mobility deficitssecondary to Moderate TBI, C4 spinal cord injury -Cont CIR   -now WBAT RLE in boot --attempting some wb with therapies  -SNF search in process    -spoke with Dr. Saintclair Halsted re: collar and NS follow up. Recommended lateral C-spine to check alignment. However collar will need to stay on a minimum of 12 weeks given location and severity of injury 2. DVT Prophylaxis/Anticoagulation: Pharmaceutical: Lovenox 3. Chronic neck pain/Pain Management: On Gabapentin bid   -changed hydrocodone to oxycodone for breakthrough pain  - fentanyl patch  50mcg/hr    -Headaches are primarly related to occipital-cervical fractures  -can tape/pad collar to prevent it from rubbing on neck   -can work up to pain tolerance RLE 4. Mood: LCSW to follow for evaluation and support when appropriate.  5. Neuropsych: This patient is notcapable of making decisions on hisown behalf.  -continue deficits in memory/insight 6. Skin/Wound Care: Air mattress overlay. Maintain adequate nutritional and hydration status.  7. Fluids/Electrolytes/Nutrition: Monitor I/O.    -diet advanced to regular/thins.    -replacing potassium 9. Left periorbital cellulitis/ CN III/VI palsy:  -continue local care  -follow up per optho  -needs to use drops/ointment 10.Left femur fracture s/p IM nail: NWB for another 5-6 weeks from 06/20/17--adv per ortho  -peristent left foot drop 11. Left open tibial fracture s/p I and D with ORIF: NWB--CAM boot.  11. Left mandibular fracture s/p CR /  tongue laceration:   -wires removed, hardware completely removed  -advancing diet as tolerated 12. Chemosis left eye: Continue Lacrilube every 3 hours till resolved per opthalmology.  -  optho recommends ongoing consvt mgt   - drops/lubricant left eye  -scleral hemorrhage OD, consvt mgt, no visual changes 13. Facial cellulitis: keflex was completed  15. Thrombocytosis:  Likely reactive.    16. ABLA:   Hemoglobin 10.9 10/21    17. Abnormal LFTs: Question due to shocked liver.     44. H/o of MDD: Was on Trintellix 5 mg and Risperidone 5 mg bid at home--continue to hold for now.   -behavior reasonable at present 19. Urine retention/neurogenic bladder: foley out. Beginning to have some sense of emptying  -continue  I/O caths   -continue urecholine trial, 25mg  TID---although I'm not optimistic  LOS (Days) 50 A FACE TO FACE EVALUATION WAS PERFORMED  Meredith Staggers, MD 07/10/2017 8:53 AM

## 2017-07-10 NOTE — Progress Notes (Signed)
Orthopaedic Trauma Progress Note  S: No orthopaedic issues, no pain in left leg  O: Awake and responding to questions LLE:  Wounds appear healed. No active DF/PF. Endorses sensation to foot. No tenderness about fracture site RLE: Boot in place  Imaging: Well aligned fracture with significant callus formation  A/P: 1. multiple R foot fractures including talus, lisfranc injury, Cuneiform fxs, MTT head fractures 2-3. Nonop in boot 2. S/p ORIF of pilon/tibial shaft now 8 weeks out -Recommend boot and ROM exercises BLE -WBAT BLE in boots, there has been more healing than anticipated and he is not tender will advance on LLE -May leave left leg open to air -May follow up in my office in 4 weeks after rehab discharge  Shona Needles, MD [facility] [phone] (phone)

## 2017-07-10 NOTE — Plan of Care (Signed)
Problem: RH Dressing Goal: LTG Patient will perform lower body dressing w/assist (OT) LTG: Patient will perform lower body dressing with assist, with/without cues in positioning using equipment (OT)  LTG discharged as pt is not able to utilize his extremities to assist in this skill.  Problem: RH Toileting Goal: LTG Patient will perform toileting w/assist, cues/equip (OT) LTG: Patient will perform toiletiing (clothes management/hygiene) with assist, with/without cues using equipment (OT)  LTG discharged as pt is not able to utilize his extremities to assist in this skill.

## 2017-07-10 NOTE — Progress Notes (Signed)
Occupational Therapy Weekly Progress Note  Patient Details  Name: [name] MRN: [id number] Date of Birth: 1966/02/21  Beginning of progress report period: July 02, 2017 End of progress report period: July 10, 2017   Patient has met 2 of 2 short term goals.  Pt continues to make steady progress with slide board transfers, sitting balance EOB, lateral leans, and functional use of BUE for self feeding, UB dressing, and grooming tasks.  Pt performs slide board transfers to drop arm BSC with mod A and performs later leans with mod A.  Pt performs UB dressing tasks (pullover shirt) seated EOB with min A.  Pt requires tot A for LB dressing tasks at bed level.  Pt's wife completes bathing tasks at bed level each night when visiting.  Pt's wife will be in for [employer] on 11/3 in preparation for pt's discharge home on 11/5.  Patient continues to demonstrate the following deficits: muscle weakness and muscle paralysis, decreased cardiorespiratoy endurance, abnormal tone, unbalanced muscle activation and decreased coordination, decreased visual acuity, delayed processing and decreased postural control and therefore will continue to benefit from skilled OT intervention to enhance overall performance with BADL.  Patient progressing toward most of his long term goals..  Plan of care revisions: 2 goals have been discharged as he is not able to use his extremities well enough for LB dressing and toileting..  OT Short Term Goals Week 6:  OT Short Term Goal 1 (Week 6): Pt will be able to use slide board with max A to wide drop arm BSC. OT Short Term Goal 1 - Progress (Week 6): Met OT Short Term Goal 2 (Week 6): Pt will be able to lateral lean on BSC with min A to allow caregivers to manage clothing and clean up.   OT Short Term Goal 2 - Progress (Week 6): Met Week 7:  OT Short Term Goal 1 (Week 7): STG=LTG secondary to ELOS      Therapy Documentation Precautions:   Precautions Precautions: Fall, Cervical Required Braces or Orthoses: Cervical Brace Cervical Brace: Hard collar, At all times Other Brace/Splint: cam boot R LE; cam boot LLE, B resting hand splints for night use Restrictions Weight Bearing Restrictions: Yes RLE Weight Bearing: Weight bearing as tolerated LLE Weight Bearing: Non weight bearing  See Function Navigator for Current Functional Status.   Therapy/Group: Individual Therapy  Leroy Libman 07/10/2017, 11:28 AM
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
## 2017-07-11 ENCOUNTER — Inpatient Hospital Stay (HOSPITAL_COMMUNITY): Payer: Self-pay | Admitting: Physical Therapy

## 2017-07-11 ENCOUNTER — Inpatient Hospital Stay (HOSPITAL_COMMUNITY): Payer: Self-pay

## 2017-07-11 NOTE — Progress Notes (Signed)
[place] PHYSICAL MEDICINE & REHABILITATION     PROGRESS NOTE    Subjective/Complaints:   Up with PT taking steps in parallel bars. Not emptying bladder but having more bladder discomfort  ROS: pt denies nausea, vomiting, diarrhea, cough, shortness of breath or chest pain    Objective: Vital Signs: Blood pressure 115/78, pulse 74, temperature 98.5 F (36.9 C), temperature source Oral, resp. rate 16, height 5\' 6"  (1.676 m), weight 90 kg (198 lb 6.6 oz), SpO2 99 %. Dg Cervical Spine 2 Or 3 Views  Result Date: 07/10/2017 CLINICAL DATA:  Postop EXAM: CERVICAL SPINE - 2-3 VIEW COMPARISON:  06/29/2017 FINDINGS: Changes of posterior fusion noted from the skullbase to C3. Normal alignment. No hardware or bony complicating feature noted. Degenerative disc disease changes throughout the cervical spine. Degenerative facet disease in the mid and lower cervical spine. IMPRESSION: Postoperative changes. No hardware bony complicating feature noted. Normal alignment. Degenerative changes. Electronically Signed   By: Rolm Baptise M.D.   On: 07/10/2017 10:05   Dg Tibia/fibula Left  Result Date: 07/09/2017 CLINICAL DATA:  ORIF of comminuted tibia and fibula fracture. EXAM: LEFT TIBIA AND FIBULA - 2 VIEW COMPARISON:  Tibia and fibula radiographs 06/09/2017 FINDINGS: There is continued healing with progressive callus formation at the proximal and distal aspects of the comminuted tibia fracture. Positioning fragments is unchanged. ORIF is stable. There is progressive demineralization of the distal tibia and fibula. IMPRESSION: 1. Continued healing of a comminuted tibia fracture status post ORIF. 2. Progressive demineralization. Electronically Signed   By: San Morelle M.D.   On: 07/09/2017 16:26   Dg Ankle Complete Left  Result Date: 07/09/2017 CLINICAL DATA:  ORIF of comminuted distal tibia and fibular fractures. EXAM: LEFT ANKLE COMPLETE - 3+ VIEW COMPARISON:  Right ankle radiographs 06/09/2017.  FINDINGS: The patient is status post ORIF. Intra medial plate screw fixation is in place. There is further demineralization. Callus formation is noted across the distal aspects of the fracture. There is also interval callus formation across the fibular fracture. IMPRESSION: 1. Evidence of healing with progressive callus formation across distal aspects of the tibia and fibular fractures. 2. Progressive demineralization. Electronically Signed   By: San Morelle M.D.   On: 07/09/2017 16:24   No results for input(s): WBC, HGB, HCT, PLT in the last 72 hours. No results for input(s): NA, K, CL, GLUCOSE, BUN, CREATININE, CALCIUM in the last 72 hours.  Invalid input(s): CO CBG (last 3)  No results for input(s): GLUCAP in the last 72 hours.  Wt Readings from Last 3 Encounters:  07/10/17 90 kg (198 lb 6.6 oz)  05/21/17 99 kg (218 lb 3.2 oz)  01/11/17 91.6 kg (202 lb)    Physical Exam:  Constitutional: He appears well-developed.  HENT: Normocephalic. Atraumatic. Eyes: lateral eye movement remains restricted. ?new slceral hemorrhage OD, OS improving Neck:  Immobilized by Aspen collar  Cardiovascular: RRR Respiratory: CTA B GI: BS +, non-tender, non-distended  Musculoskeletal: minimal edema Neurological:  Alert Motor  2 to 2+/5 prox to distal B/L UE's. Left a little stronger than right Left III, VI nerve weakness . III with some improvement Right VI deficit--no CN changes B/l LE 2+/5 HF. RLE 2-3/5 ADF/PF Skin: Skin is warmand dry.  scars.    Psychiatric: pleasant  Assessment/Plan: 1. Functional deficits, tetraplegia secondary to TBI/cervical SCI which require 3+ hours per day of interdisciplinary therapy in a comprehensive inpatient rehab setting. Physiatrist is providing close team supervision and 24 hour management of active medical  problems listed below. Physiatrist and rehab team continue to assess barriers to discharge/monitor patient progress toward functional and medical  goals.  Function:  Bathing Bathing position Bathing activity did not occur: N/A (Night Bath) Position: Bed  Bathing parts   Body parts bathed by helper: Right arm, Left arm, Chest, Abdomen, Front perineal area, Buttocks, Right upper leg, Left upper leg, Right lower leg, Left lower leg, Back  Bathing assist Assist Level: 2 helpers      Upper Body Dressing/Undressing Upper body dressing   What is the patient wearing?: Pull over shirt/dress     Pull over shirt/dress - Perfomed by patient: Thread/unthread right sleeve, Thread/unthread left sleeve, Pull shirt over trunk Pull over shirt/dress - Perfomed by helper: Put head through opening   Button up shirt - Perfomed by helper: Thread/unthread right sleeve, Thread/unthread left sleeve, Pull shirt around back, Button/unbutton shirt    Upper body assist Assist Level: 2 helpers      Lower Body Dressing/Undressing Lower body dressing   What is the patient wearing?: Pants       Pants- Performed by helper: Thread/unthread right pants leg, Thread/unthread left pants leg, Pull pants up/down                      Lower body assist Assist for lower body dressing: 2 Helpers      Toileting Toileting Toileting activity did not occur: No continent bowel/bladder event   Toileting steps completed by helper: Adjust clothing prior to toileting, Performs perineal hygiene, Adjust clothing after toileting    Toileting assist Assist level:  (totalA)   Transfers Chair/bed transfer Chair/bed transfer activity did not occur: N/A Chair/bed transfer method: Lateral scoot Chair/bed transfer assist level: Moderate assist (Pt 50 - 74%/lift or lower) Chair/bed transfer assistive device: Armrests, Sliding board Mechanical lift: Maximove   Locomotion Ambulation Ambulation activity did not occur: Safety/medical Editor, commissioning activity did not occur: Safety/medical concerns Type: Manual Max wheelchair distance:  75 Assist Level: Touching or steadying assistance (Pt > 75%)  Cognition Comprehension Comprehension assist level: Understands complex 90% of the time/cues 10% of the time  Expression Expression assist level: Expresses complex 90% of the time/cues < 10% of the time  Social Interaction Social Interaction assist level: Interacts appropriately 90% of the time - Needs monitoring or encouragement for participation or interaction.  Problem Solving Problem solving assist level: Solves basic 90% of the time/requires cueing < 10% of the time  Memory Memory assist level: Recognizes or recalls 90% of the time/requires cueing < 10% of the time   Medical Problem List and Plan: 1. Functional, cognitive and mobility deficitssecondary to Moderate TBI, C4 spinal cord injury -Cont CIR   -now WBAT RLE in boot --attempting some wb with therapies  -SNF search in process    -spoke with Dr. Saintclair Halsted re: collar and NS follow up.  collar will need to stay on a minimum of 12 weeks given location and severity of injury. Follow up xrays from yesterday reviewed and show good alignment 2. DVT Prophylaxis/Anticoagulation: Pharmaceutical: Lovenox 3. Chronic neck pain/Pain Management: On Gabapentin bid   -changed hydrocodone to oxycodone for breakthrough pain  - fentanyl patch  45mcg/hr    -Headaches are primarly related to occipital-cervical fractures  -can tape/pad collar to prevent it from rubbing on neck   -can work up to pain tolerance RLE 4. Mood: LCSW to follow for evaluation and support when appropriate.  5.  Neuropsych: This patient is notcapable of making decisions on hisown behalf.  -continue deficits in memory/insight 6. Skin/Wound Care: Air mattress overlay. Maintain adequate nutritional and hydration status.  7. Fluids/Electrolytes/Nutrition: Monitor I/O.    -diet advanced to regular/thins.    -replacing potassium 9. Left periorbital cellulitis/ CN III/VI palsy:  -continue local care  -follow  up per optho  -needs to use drops/ointment 10.Left femur fracture s/p IM nail: NWB for another 5-6 weeks from 06/20/17--adv per ortho  -peristent left foot drop 11. Left open tibial fracture s/p I and D with ORIF: NWB--CAM boot.  11. Left mandibular fracture s/p CR /tongue laceration:   -wires removed, hardware completely removed  -advancing diet as tolerated 12. Chemosis left eye: Continue Lacrilube every 3 hours till resolved per opthalmology.  -  optho recommends ongoing consvt mgt   - drops/lubricant left eye  -scleral hemorrhage OD, consvt mgt, no visual changes 13. Facial cellulitis: keflex was completed  15. Thrombocytosis: Likely reactive.    16. ABLA:   Hemoglobin 10.9 10/21    17. Abnormal LFTs: Question due to shocked liver.     85. H/o of MDD: Was on Trintellix 5 mg and Risperidone 5 mg bid at home--continue to hold for now.   -behavior reasonable at present 19. Urine retention/neurogenic bladder: foley out. Beginning to have some sense of emptying  -continue  I/O caths   -continue urecholine trial---he wants to continue . Will push to 50mg  TID---although I'm not optimistic  LOS (Days) 51 A FACE TO FACE EVALUATION WAS PERFORMED  Meredith Staggers, MD 07/11/2017 9:11 AM

## 2017-07-11 NOTE — Progress Notes (Signed)
Occupational Therapy Session Note  Patient Details  Name: [name] MRN: [id number] Date of Birth: 11/27/1965  Today's Date: 07/11/2017 OT Individual Time: 0354-6568 OT Individual Time Calculation (min): 45 min    Short Term Goals: Week 7:  OT Short Term Goal 1 (Week 7): STG=LTG secondary to ELOS  Skilled Therapeutic Interventions/Progress Updates:    Focus on bed mobility, functional transfers, grooming, UB bathing/dressing, directing care, and safety awareness.  Pt doffed hospital gown in preparation for BADLs.  Pt now BLE WBAT and was able to partially bridge in bed to facilitate pulling pants over hips.  Pt able to move BLE over EOB but requires min A for sidelying>sit EOB.  Pt performed lateral leans EOB for slide board placement.  Pt performed slide board transfer to w/c with min A and mod verbal cues for safety awareness and sequencing.  Pt practiced w/c mobility in room with min verbal cues for safety awareness to prevent running into furniture/fixtures. Pt remained in w/c with all needs within reach.   Therapy Documentation Precautions:  Precautions Precautions: Fall, Cervical Required Braces or Orthoses: Cervical Brace Cervical Brace: Hard collar, At all times Other Brace/Splint: cam boot R LE; cam boot LLE, B resting hand splints for night use Restrictions Weight Bearing Restrictions: Yes RLE Weight Bearing: Weight bearing as tolerated LLE Weight Bearing: Non weight bearing General:   Vital Signs: Therapy Vitals Temp: 98.5 F (36.9 C) Temp Source: Oral Pulse Rate: 74 Resp: 16 BP: 115/78 Patient Position (if appropriate): Lying Oxygen Therapy SpO2: 99 % O2 Device: Not Delivered Pain:   ADL:   Vision   Perception    Praxis   Exercises:   Other Treatments:    See Function Navigator for Current Functional Status.   Therapy/Group: Individual Therapy  Leroy Libman 07/11/2017, 7:47 AM

## 2017-07-11 NOTE — Progress Notes (Signed)
Physical Therapy Weekly Progress Note  Patient Details  Name: [name] MRN: [id number] Date of Birth: 09-22-1965  Beginning of progress report period: July 04, 2017 End of progress report period: July 11, 2017  Today's Date: 07/11/2017 PT Individual Time: 0815-0900 and 1430-1530 PT Individual Time Calculation (min): 45 min and 60 min (total 105 min)   Patient has met 4 of 4 short term goals.  Pt with improvements in bed mobility, sitting balance and transfers over the past week. Pt was cleared yesterday for weight bearing as tolerated through LLE. On first day of using LLE for functional activities pt is able to demonstrate sit <>stand in parallel bars and has initiated gait training. Continues to be limited by decreased UE functional use, back/neck pain, decreased activity tolerance. Remains motivated to progress towards goals. Family education planned for this upcoming weekend.   Patient continues to demonstrate the following deficits muscle weakness, muscle joint tightness and muscle paralysis, decreased cardiorespiratoy endurance, impaired timing and sequencing, abnormal tone, unbalanced muscle activation and decreased coordination, decreased visual acuity, decreased problem solving, decreased safety awareness and decreased memory and decreased sitting balance, decreased standing balance, decreased postural control and decreased balance strategies and therefore will continue to benefit from skilled PT intervention to increase functional independence with mobility.  Patient progressing toward long term goals..  Continue plan of care.  PT Short Term Goals Week 7:  PT Short Term Goal 1 (Week 7): Pt will demonstrate rolling R/L consistent modA with bed features  PT Short Term Goal 2 (Week 7): Pt will demonstrate supine <>sit with modA PT Short Term Goal 3 (Week 7): Pt will perform sit <>stand modA maintaining NWB LLE PT Short Term Goal 4 (Week 7): Pt will propel w/c x100' with  BUE and minA No short term goals set  Skilled Therapeutic Interventions/Progress Updates: Tx 1: Pt received seated in w/c, reports pain from sensation of full bladder and requesting bladder be scanned prior to participation. Instructed pt in use of controls for repositioning chair to allow for bladder scan/catheter; pt requesting therapist reposition chair because "you can do it faster", but discussed with pt importance of improving ability to perform own chair functions. Pt missed time d/t nursing care to perform catheter with pt c/o significant discomfort and unable to wait until after therapy. Transfer w/c <>mat table with minA and transfer board. Sit <>stand in parallel bars from elevated mat table with mod/maxA initially; reduced to min guard/close S with repetition. Pre-gait activities including weight shifting R/L, stepping in place with min/modA, cues for upright posture and glute activation. Gait x2 trials with minA overall, ongoing cues for upright posture, larger step length. Backwards steps x5 each side to return to power chair at end of session; modA for backwards walking d/t decreased coordination RLE, decreased stance control LLE. Returned to room totalA in power chair, remained seated in w/c all needs in reach and NT alerted to pt request to eat breakfast.   Tx 2: Pt received seated in w/c, denies pain and agreeable to treatment. Power w/c management to/from gym with S; min cues for safety d/t large back end of chair and center-wheel drive. Attempted sit >stand from w/c, however poor setup for therapist to assist as needed. Lateral scoot w/c <>mat table with transfer board modA. Sit <>stand x5 reps from mat table with mod/maxA; cueing in standing for L quad/glute activation. Lateral weight shifts R/L, ongoing cueing for LLE. Sit >supine modA for LE management. Rolling R/L modA with setup  for LE position, facilitation for UE reaching to initiate roll. Supine>sit modA for LE management and trunk  control. Provided extensive education on pressure relief in new w/c now that he is able to perform pressure relief independently in power chair, provided pt with timer to use as reminder to perform pressure relief. Pt initially reported plan to stay up in chair for 30-45 min after session, stating he has been told several times by several different therapists that he needs to spend more time out of bed, and that he is now able to change positions and be more comfortable in the power chair vs tilt in space. However pt later requested to return to bed at end of session d/t needing to use bedpan. Remained supine in bed, all needs in reach.      Therapy Documentation Precautions:  Precautions Precautions: Fall, Cervical Required Braces or Orthoses: Cervical Brace Cervical Brace: Hard collar, At all times Other Brace/Splint: cam boot R LE; cam boot LLE, B resting hand splints for night use Restrictions Weight Bearing Restrictions: Yes RLE Weight Bearing: Weight bearing as tolerated LLE Weight Bearing: Non weight bearing General:  Missed 15 min d/t nursing care in AM session   See Function Navigator for Current Functional Status.  Therapy/Group: Individual Therapy  [name] 07/11/2017, 9:42 AM

## 2017-07-11 NOTE — Progress Notes (Signed)
Occupational Therapy Note  Patient Details  Name: [name] MRN: [id number] Date of Birth: 1966-03-10  Today's Date: 07/11/2017 OT Individual Time: 1300-1400 OT Individual Time Calculation (min): 60 min   Pt denies pain  Individual Therapy  Initial focus on bed mobility and slide board transfers with min A.  Pt transitioned to therapy gym to practice sit<>stand without AD at min A.  Pt attempted stand pivot transfer without AD but unable to unweight LLE to advance.  Pt able to complete stand pivot transfer with RW.  Pt required min A for standing balance and mod A when unweighting LE to advance contralateral LE during transfer.  Pt attempted stand pivot transfer back to w/c but unable secondary to muscle fatigue,  Pt returned to room and remained in power w/c with visitors present and all needs within reach.    [name] [facility] 07/11/2017, 2:43 PM

## 2017-07-12 ENCOUNTER — Inpatient Hospital Stay (HOSPITAL_COMMUNITY): Payer: Self-pay | Admitting: Physical Therapy

## 2017-07-12 ENCOUNTER — Inpatient Hospital Stay (HOSPITAL_COMMUNITY): Payer: Self-pay

## 2017-07-12 MED ORDER — BETHANECHOL CHLORIDE 25 MG PO TABS
50.0000 mg | ORAL_TABLET | Freq: Three times a day (TID) | ORAL | Status: DC
  Administered 2017-07-12 – 2017-07-17 (×17): 50 mg via ORAL
  Filled 2017-07-12 (×18): qty 2

## 2017-07-12 NOTE — Progress Notes (Signed)
Occupational Therapy Session Note  Patient Details  Name: [name] MRN: [id number] Date of Birth: 1965/12/08  Today's Date: 07/12/2017 OT Individual Time: 0700-0759 OT Individual Time Calculation (min): 59 min    Short Term Goals: Week 7:  OT Short Term Goal 1 (Week 7): STG=LTG secondary to ELOS  Skilled Therapeutic Interventions/Progress Updates:    Focus on bed mobility, sit<>st from EOB for pulling up pants, slide board transfers, grooming, directing care, and safety awareness.  Pt required min a for sidelying>sit EOB with HOB raised. Pt maintained sitting balance EOB while donning pullover shirt and when pants were threaded.  Pt performed sit<>stand with min A from EOB with surface elevated.  Pt required tot A for LB dressing but maintained standing balance with steady A.  Pt performed slide board transfer with min A after board placement. Pt independent with directing care but continues to require min verbal cues for safety awareness.  Pt remained in power w/c with all needs within reach.  Pt provided with timer set on 30 mins with instructions to perform pressure relief.  Pt verbalized understanding.   Therapy Documentation Precautions:  Precautions Precautions: Fall, Cervical Required Braces or Orthoses: Cervical Brace Cervical Brace: Hard collar, At all times Other Brace/Splint: cam boot R LE; cam boot LLE, B resting hand splints for night use Restrictions Weight Bearing Restrictions: Yes RLE Weight Bearing: Weight bearing as tolerated LLE Weight Bearing: Non weight bearing   Pain:  Pt denies pain  See Function Navigator for Current Functional Status.   Therapy/Group: Individual Therapy  Leroy Libman 07/12/2017, 8:00 AM

## 2017-07-12 NOTE — Progress Notes (Signed)
[facility]ILIT[facility]     PROGRESS NOTE    Subjective/Complaints:  no new complaints. Pleased that he was able to take steps in therapy yesterday. No urge to empty, no continent/incontinent voids  ROS: pt denies nausea, vomiting, diarrhea, cough, shortness of breath or chest pain   Objective: Vital Signs: Blood pressure 113/75, pulse 93, temperature 98.7 F (37.1 C), temperature source Oral, resp. rate 16, height 5\' 6"  (1.676 m), weight 90 kg (198 lb 6.6 oz), SpO2 97 %. Dg Cervical Spine 2 Or 3 Views  Result Date: 07/10/2017 CLINICAL DATA:  Postop EXAM: CERVICAL SPINE - 2-3 VIEW COMPARISON:  06/29/2017 FINDINGS: Changes of posterior fusion noted from the skullbase to C3. Normal alignment. No hardware or bony complicating feature noted. Degenerative disc disease changes throughout the cervical spine. Degenerative facet disease in the mid and lower cervical spine. IMPRESSION: Postoperative changes. No hardware bony complicating feature noted. Normal alignment. Degenerative changes. Electronically Signed   By: Rolm Baptise M.D.   On: 07/10/2017 10:05   No results for input(s): WBC, HGB, HCT, PLT in the last 72 hours. No results for input(s): NA, K, CL, GLUCOSE, BUN, CREATININE, CALCIUM in the last 72 hours.  Invalid input(s): CO CBG (last 3)  No results for input(s): GLUCAP in the last 72 hours.  Wt Readings from Last 3 Encounters:  07/10/17 90 kg (198 lb 6.6 oz)  05/21/17 99 kg (218 lb 3.2 oz)  01/11/17 91.6 kg (202 lb)    Physical Exam:  Constitutional: He appears well-developed.  HENT: Normocephalic. Atraumatic. Eyes: lateral eye movement remains restricted. ?new slceral hemorrhage OD, OS improving Neck:  Immobilized by Aspen collar  Cardiovascular: RRR Respiratory: CTA B GI: BS +, non-tender, non-distended  Musculoskeletal: minimal edema Neurological:  Alert Motor  2 to 2+/5 prox to distal B/L UE's. Left side remains little stronger than right Left  III, VI nerve weakness . III -- some improvement Right VI deficit--no CN changes B/l LE 2+/5 HF. RLE 2-3/5 ADF/PF Skin: Skin is warmand dry.  scars.    Psychiatric: pleasant  Assessment/Plan: 1. Functional deficits, tetraplegia secondary to TBI/cervical SCI which require 3+ hours per day of interdisciplinary therapy in a comprehensive inpatient rehab setting. Physiatrist is providing close team supervision and 24 hour management of active medical problems listed below. Physiatrist and rehab team continue to assess barriers to discharge/monitor patient progress toward functional and medical goals.  Function:  Bathing Bathing position Bathing activity did not occur: N/A Position: Bed  Bathing parts   Body parts bathed by helper: Right arm, Left arm, Chest, Abdomen, Front perineal area, Buttocks, Right upper leg, Left upper leg, Right lower leg, Left lower leg, Back  Bathing assist Assist Level: 2 helpers      Upper Body Dressing/Undressing Upper body dressing   What is the patient wearing?: Pull over shirt/dress     Pull over shirt/dress - Perfomed by patient: Thread/unthread right sleeve, Thread/unthread left sleeve, Pull shirt over trunk Pull over shirt/dress - Perfomed by helper: Put head through opening   Button up shirt - Perfomed by helper: Thread/unthread right sleeve, Thread/unthread left sleeve, Pull shirt around back, Button/unbutton shirt    Upper body assist Assist Level: Touching or steadying assistance(Pt > 75%)      Lower Body Dressing/Undressing Lower body dressing   What is the patient wearing?: Pants       Pants- Performed by helper: Thread/unthread right pants leg, Thread/unthread left pants leg, Pull pants up/down  Lower body assist Assist for lower body dressing: 2 Helpers      Toileting Toileting Toileting activity did not occur: No continent bowel/bladder event   Toileting steps completed by helper: Adjust clothing  prior to toileting, Performs perineal hygiene, Adjust clothing after toileting    Toileting assist Assist level:  (totalA)   Transfers Chair/bed transfer Chair/bed transfer activity did not occur: N/A Chair/bed transfer method: Lateral scoot Chair/bed transfer assist level: Moderate assist (Pt 50 - 74%/lift or lower) Chair/bed transfer assistive device: Armrests, Sliding board Mechanical lift: Maximove   Locomotion Ambulation Ambulation activity did not occur: Safety/medical concerns   Max distance: 10 Assist level: Moderate assist (Pt 50 - 74%)   Wheelchair Wheelchair activity did not occur: Safety/medical concerns Type: Manual Max wheelchair distance: 75 Assist Level: Touching or steadying assistance (Pt > 75%)  Cognition Comprehension Comprehension assist level: Understands complex 90% of the time/cues 10% of the time  Expression Expression assist level: Expresses complex 90% of the time/cues < 10% of the time  Social Interaction Social Interaction assist level: Interacts appropriately 90% of the time - Needs monitoring or encouragement for participation or interaction.  Problem Solving Problem solving assist level: Solves basic 90% of the time/requires cueing < 10% of the time  Memory Memory assist level: Recognizes or recalls 90% of the time/requires cueing < 10% of the time   Medical Problem List and Plan: 1. Functional, cognitive and mobility deficitssecondary to Moderate TBI, C4 spinal cord injury -Cont CIR   -now WBAT BLE. Given performance in therapies and wb status will go for another week to improve transfers and ambulatory mobility. May be able to avoid power w/c as well     -spoke with Dr. Saintclair Halsted re: collar and NS follow up.  collar will need to stay on a minimum of 12 weeks given location and severity of injury. Follow up xrays from yesterday reviewed and show good alignment 2. DVT Prophylaxis/Anticoagulation: Pharmaceutical: Lovenox 3. Chronic neck  pain/Pain Management: On Gabapentin bid   -changed hydrocodone to oxycodone for breakthrough pain  - fentanyl patch  92mcg/hr    -Headaches are primarly related to occipital-cervical fractures  -can tape/pad collar to prevent it from rubbing on neck   -can work up to pain tolerance RLE 4. Mood: LCSW to follow for evaluation and support when appropriate.  5. Neuropsych: This patient is notcapable of making decisions on hisown behalf.  -continue deficits in memory/insight 6. Skin/Wound Care: Air mattress overlay. Maintain adequate nutritional and hydration status.  7. Fluids/Electrolytes/Nutrition: Monitor I/O.    -diet advanced to regular/thins.    -replacing potassium 9. Left periorbital cellulitis/ CN III/VI palsy:  -continue local care  -follow up per optho  -needs to use drops/ointment 10.Left femur fracture s/p IM nail: now WBAT bilateral LE's  -peristent left foot drop 11. Left open tibial fracture s/p I and D with ORIF: NWB--CAM boot.  11. Left mandibular fracture s/p CR /tongue laceration:   -wires removed, hardware completely removed  -advancing diet as tolerated 12. Chemosis left eye: Continue Lacrilube every 3 hours till resolved per opthalmology.  -  optho recommends ongoing consvt mgt   - drops/lubricant left eye  -scleral hemorrhage OD, consvt mgt, no visual changes 13. Facial cellulitis: keflex was completed  15. Thrombocytosis: Likely reactive.    16. ABLA:   Hemoglobin 10.9 10/21    17. Abnormal LFTs: Question due to shocked liver.     53. H/o of MDD: Was on Trintellix 5 mg and Risperidone  5 mg bid at home--continue to hold for now.   -behavior reasonable at present 19. Urine retention/neurogenic bladder: foley out. Beginning to have some sense of emptying  -continue  I/O caths   -continue urecholine trial---he wants to continue . Will increase to 50mg  TID today   -I remain pessimistic that he will be able to void  LOS (Days) 52 A FACE TO FACE  EVALUATION WAS PERFORMED  Meredith Staggers, MD 07/12/2017 8:52 AM

## 2017-07-12 NOTE — Progress Notes (Signed)
Physical Therapy Session Note  Patient Details  Name: [name] MRN: [id number] Date of Birth: 11-Apr-1966  Today's Date: 07/12/2017 PT Individual Time: 0900-1000 and 1415-1500 PT Individual Time Calculation (min): 60 min and 45 min (total 105 min)   Short Term Goals: No short term goals set  Skilled Therapeutic Interventions/Progress Updates: Tx 1: Pt received seated in w/c with handoff from [place] who had been discussing length of stay to improve mobility and independence following change in WB precautions. Pt disappointed by increased length of stay, provided ongoing education regarding goals, importance of utilizing time available to make as much progress as possible as he will have very little follow up after d/c d/t insurance limitations. Lateral scoot transfer w/c>mat table modA. Sit <>stand x5 reps with RW and mod/maxA; cues for increased anterior weight shift. In stance, performed weight shifting R/L with cues and assist to maintain LLE hip/knee extension to allow for RLE foot clearance. Standing forward/backward steps and gait x10' in parallel bars with maxA d/t LLE strength/coordination deficits and ongoing assist needed to maintain hip/knee extension. Stand pivot transfer to w/c modA +2. Returned to room modI in power chair, all needs in reach.   Tx 2: Pt missed 15 min d/t nursing care with catheter being performed. Assisted pt with eating a few bites of lunch before agreeable to participate. Supine>sit minA with increased time. Transfer bed>w/c and w/c <>mat table modA. Sit <>stand from elevated mat table with eva walker with minA; performed repetitive L weight shifting with RLE step in place for focus on L hip/knee extension and stance control. Gait in parallel bars mod/maxA x5' forward, turn 180 degrees, 5' back towards mat, turn 180 degrees then sit on mat table. Initial turn required modA, second turn required max/total d/t fatigue and decline in postural control. Several LLE  steps/stance control with improved coordination and less assist required. Slow non-functional gait speed at this time. Returned to room modI in power chair, all needs in reach.      Therapy Documentation Precautions:  Precautions Precautions: Fall, Cervical Required Braces or Orthoses: Cervical Brace Cervical Brace: Hard collar, At all times Other Brace/Splint: cam boot R LE; cam boot LLE, B resting hand splints for night use Restrictions Weight Bearing Restrictions: Yes RLE Weight Bearing: Weight bearing as tolerated LLE Weight Bearing: Non weight bearing General: PT Amount of Missed Time (min): 15 Minutes PT Missed Treatment Reason: Nursing care Pain: Pain Assessment Pain Assessment: No/denies pain   See Function Navigator for Current Functional Status.   Therapy/Group: Individual Therapy  [name] 07/12/2017, 12:52 PM

## 2017-07-12 NOTE — Progress Notes (Signed)
Occupational Therapy Note  Patient Details  Name: [name] MRN: [id number] Date of Birth: Dec 24, 1965  Today's Date: 07/12/2017 OT Individual Time: 7209-1980 OT Individual Time Calculation (min): 30 min  and Today's Date: 07/12/2017 OT Missed Time: 30 Minutes Missed Time Reason: Patient unwilling/refused to participate without medical reason  Pt denies pain Individual Therapy  Pt resting in bed upon arrival.  Pt initially refused interacting with therapist stating that he was upset because he "had to stay" in the hospital 4 extra day.  I checked back with pt later and discussed with him the rationale of extending his stay (new LLE weight bearing status and ability to stand/ambulate). I explained that all therapists needed his full effort and cooperation to justify extending his stay.  I explained that his hard work over the next few days will help his wife when he returns home.  Pt committed to working "hard" over the next few days to demonstrate improved function.  Pt remained in bed with all needs within reach.    [name] [facility] 07/12/2017, 12:01 PM

## 2017-07-12 NOTE — Progress Notes (Signed)
Social Work Patient ID: [name], male   DOB: [birth year], 51 y.o.   MRN: [id number]   Discussed pt's progress with MD and therapies given that he is now allowed to WB on both LE.  Therapists are pleased with the amount of standing he could do on this first day of bil WBAT and feel he has potential to make good progress with mobility and transfers if we could extend his CIR stay through until the end of next week.  MD is in agreement with extension.  Have discussed this with pt and spouse and both are agreed, however, pt a little disappointed as he was looking forward to going home on Monday.   Wife reports that she does have a caregiver arranged to stay with pt while she works and is in the process of getting a ramp in place.  She is very optimistic about gains he could make this coming week.  She is aware that [facility], LCSW will be covering in my absence next week.  Denelda Akerley, LCSW

## 2017-07-13 ENCOUNTER — Encounter (HOSPITAL_COMMUNITY): Payer: Self-pay

## 2017-07-13 ENCOUNTER — Ambulatory Visit (HOSPITAL_COMMUNITY): Payer: Self-pay | Admitting: Physical Therapy

## 2017-07-13 NOTE — Progress Notes (Signed)
Physical Therapy Session Note  Patient Details  Name: [name] MRN: [id number] Date of Birth: [birth year]  Today's Date: 07/13/2017 PT Individual Time: 1100-1200 PT Individual Time Calculation (min): 60 min   Short Term Goals: Week 7:  PT Short Term Goal 1 (Week 7): Pt will demonstrate rolling R/L consistent modA with bed features  PT Short Term Goal 2 (Week 7): Pt will demonstrate supine <>sit with modA PT Short Term Goal 3 (Week 7): Pt will perform sit <>stand modA maintaining NWB LLE PT Short Term Goal 4 (Week 7): Pt will propel w/c x100' with BUE and minA  Skilled Therapeutic Interventions/Progress Updates:   Pt received sitting in WC and agreeable to PT.   Pt's wife present for family education. Training for SB transfers, bed mobility, WC safety. PT provided moderate cues for proper assist from family of SB transfer. Sit<>supine completed x 2 with PT and wife. Pt able to self direct mod assist from wife into and out of supine. Education for wife on proper pressure relief in power WC. At end of session Pt requesting to get on bed pan for BM. Pt returned to room and performed SB transfer to bed with mod assist. Sit>supine completed with mod, and left supine in bed, on bed pan with call bell in reach and all needs met.       Therapy Documentation Precautions:  Precautions Precautions: Fall, Cervical Required Braces or Orthoses: Cervical Brace Cervical Brace: Hard collar, At all times Other Brace/Splint: cam boot R LE; cam boot LLE, B resting hand splints for night use Restrictions Weight Bearing Restrictions: Yes RLE Weight Bearing: Weight bearing as tolerated LLE Weight Bearing: Non weight bearing Pain: 0/10 at rest.   See Function Navigator for Current Functional Status.   Therapy/Group: Individual Therapy  Lorie Phenix 07/13/2017, 1:28 PM

## 2017-07-13 NOTE — Progress Notes (Signed)
Wife states she will debride the right boggy heel of her husband herself if we don't do it here. Spoke to Dr Raliegh Ip  Due to her feeling so strongly. Educated as to the dangers of making an opened wound. Wife is adamant about doing it herself

## 2017-07-13 NOTE — Progress Notes (Signed)
Patient ID: [name], male   DOB: 1966-02-28, 51 y.o.   MRN: [id number]   07/13/2017.  [name] is a 51 y.o. male admitted for CIR with functional, cognitive and mobility deficitssecondary to Moderate TBI, C4 spinal cord injury.  Past Medical History:  Diagnosis Date  . Chronic back pain   . History of suicidal ideation 01/2017   with overdose/ [facility] admission  . Lumbago with sciatica    left side  . Vitamin D deficiency       Subjective: Main complaint today is right lateral ankle discomfort  Objective: Vital signs in last 24 hours: Temp:  [98.1 F (36.7 C)-98.4 F (36.9 C)] 98.4 F (36.9 C) (11/03 0337) Pulse Rate:  [79-80] 79 (11/03 0337) Resp:  [18-19] 18 (11/03 0337) BP: (119-120)/(66-74) 119/74 (11/03 0337) SpO2:  [98 %-100 %] 98 % (11/03 0337) Weight:  [198 lb 3.2 oz (89.9 kg)] 198 lb 3.2 oz (89.9 kg) (11/03 0337) Weight change:  Last BM Date: 07/11/17  Intake/Output from previous day: 11/02 0701 - 11/03 0700 In: 480 [P.O.:480] Out: 1900 [Urine:1900] Last cbgs: CBG (last 3)  No results for input(s): GLUCAP in the last 72 hours.   Physical Exam General: No apparent distress   HEENT: Cervical collar in place.  Okay yet.  Dr. Wynetta Emery is making rounds on [name] today.  He does yesterday use yesterday; high-grade Lungs: Normal effort. Lungs clear to auscultation, no crackles or wheezes. Cardiovascular: Regular rate and rhythm, no edema Abdomen: S/NT/ND; BS(+) Musculoskeletal:  unchanged Neurological: No new neurological deficits with tetraplegia Wounds: N/A    Extremities: Both lower extremities and feet wrapped.  Slight tenderness over the right lateral malleolus with area of slight bruising Mental state: Alert, oriented, cooperative    Lab Results: BMET    Component Value Date/Time   NA 137 07/01/2017 1858   NA 138 11/02/2015 0931   K 3.8 07/01/2017 1858   CL 100 (L) 07/01/2017 1858   CO2 30 07/01/2017 1858   GLUCOSE 154  (H) 07/01/2017 1858   BUN 10 07/01/2017 1858   BUN 13 11/02/2015 0931   CREATININE 0.67 07/01/2017 1858   CALCIUM 10.0 07/01/2017 1858   GFRNONAA >60 07/01/2017 1858   GFRAA >60 07/01/2017 1858   CBC    Component Value Date/Time   WBC 7.5 06/30/2017 1309   RBC 4.03 (L) 06/30/2017 1309   HGB 10.9 (L) 06/30/2017 1309   HGB 14.6 11/02/2015 0931   HCT 33.4 (L) 06/30/2017 1309   HCT 44.1 11/02/2015 0931   PLT 238 06/30/2017 1309   PLT 243 11/02/2015 0931   MCV 82.9 06/30/2017 1309   MCV 85 11/02/2015 0931   MCH 27.0 06/30/2017 1309   MCHC 32.6 06/30/2017 1309   RDW 15.9 (H) 06/30/2017 1309   RDW 14.9 11/02/2015 0931   LYMPHSABS 1.9 06/18/2017 0521   LYMPHSABS 2.7 11/02/2015 0931   MONOABS 0.8 06/18/2017 0521   EOSABS 0.2 06/18/2017 0521   EOSABS 0.2 11/02/2015 0931   BASOSABS 0.1 06/18/2017 0521   BASOSABS 0.0 11/02/2015 0931    Studies/Results: No results found.  Medications: I have reviewed the patient's current medications.  Assessment/Plan:   Functional, cognitive and mobility deficitssecondary to Moderate TBI, C4 spinal cord injury.  Continue CIR Chronic neck pain.  Continue present regimen which includes aspirin collar.  No patch and oxycodone for breakthrough pain Skin/wound care.  Continue air mattress Status post multitrauma    Neurogenic bladder.  Continue urecholine  trial  Length of stay, days: [place] , MD 07/13/2017, 9:54 AM

## 2017-07-13 NOTE — Progress Notes (Signed)
Occupational Therapy Session Note  Patient Details  Name: [name] MRN: [id number] Date of Birth: Dec 15, 1965  LATE ENTRY for service provided on 07/13/2017  Skilled Therapeutic Interventions/Progress Updates:    1:1. Wife not present for family ed until last 10 min of therapy in which OT demonstrated stand pivot transfer, body mechanics, RW management and cueing throughout. OT informed wife of session happening next day at same time and requested wife be present for that session. Prior to wife coming, pt was received in bed supine. OT threads BLE into pants and dons CAM boots. Pt able to direct care/ lift LE to participate in LB dressing. Pt supine to sitting EOB with min A and A to scoot to EOB. Pt dons pull over shirt with min A for threading head. Pt sit to stand with MOD lifting A and pt attempts to advance pants past hips. Pt able to complete 75% however requires mod-MAX A for standing balnce 2/2 posterior lean. Pt completes stand pivot transfer w/c<>EOM with MOD-MAX A for balance with VC and tactile cues for wieght shifting to step feet. Exited session with pt seated in [place] with wife present in gym awaiting PT arrival.   Therapy Documentation Precautions:  Precautions Precautions: Fall, Cervical Required Braces or Orthoses: Cervical Brace Cervical Brace: Hard collar, At all times Other Brace/Splint: cam boot R LE; cam boot LLE, B resting hand splints for night use Restrictions Weight Bearing Restrictions: Yes RLE Weight Bearing: Weight bearing as tolerated LLE Weight Bearing: Non weight bearing General:   Vital Signs: Therapy Vitals Temp: 98.4 F (36.9 C) Temp Source: Oral Pulse Rate: 79 Resp: 18 BP: 119/74 Patient Position (if appropriate): Lying Oxygen Therapy SpO2: 98 % O2 Device: Not Delivered Pain: Pain Assessment Pain Score: Asleep ADL:   Vision   Perception    Praxis   Exercises:   Other Treatments:    See Function Navigator for Current  Functional Status.   Therapy/Group: Individual Therapy  Tonny Branch 07/13/2017, 6:57 AM

## 2017-07-14 ENCOUNTER — Ambulatory Visit (HOSPITAL_COMMUNITY): Payer: Self-pay

## 2017-07-14 ENCOUNTER — Encounter (HOSPITAL_COMMUNITY): Payer: Self-pay

## 2017-07-14 NOTE — Progress Notes (Signed)
Occupational Therapy Session Note  Patient Details  Name: [name] MRN: [id number] Date of Birth: 1966-03-02  Today's Date: 07/14/2017 OT Individual Time: 1000-1050 OT Individual Time Calculation (min): 50 min    Skilled Therapeutic Interventions/Progress Updates:    1:1. Wife not present for OT family training session today. Pt places phone call to wife to see if she is on her way and wife reports she will be there for sessions tomorrow. OT problem solves why PWC not moving with joystick unlocking emergency brake. Pt completes stand pivot transfers to/from commode with MOD A for lifting and up to MAX A for balance while stepping feet with VC for correcting posterior lean and weight shifting laterally to step feet side ways. Pt able to advance pants past hips prior to toileting with MOD A for balacne, Pt attempts to void bladder but unsuccessful. When in chair RN caths pt and pt missed 10 min skilled OT. After cathing done, pt completes 5 sit to stands from [facility]  With RW with MOD fading to MIN A for steadying and Vc for hip extension and keeping all 4 wheels of RW on floor instead of pulling. Exited session with pt seated in w/c with direct handoff to PT.   Therapy Documentation Precautions:  Precautions Precautions: Fall, Cervical Required Braces or Orthoses: Cervical Brace Cervical Brace: Hard collar, At all times Other Brace/Splint: cam boot R LE; cam boot LLE, B resting hand splints for night use Restrictions Weight Bearing Restrictions: Yes RLE Weight Bearing: Weight bearing as tolerated(with cam boot OOB) LLE Weight Bearing: Weight bearing as tolerated(with cam boot when OOB)  See Function Navigator for Current Functional Status.   Therapy/Group: Individual Therapy  Tonny Branch 07/14/2017, 10:35 AM

## 2017-07-14 NOTE — Progress Notes (Signed)
Patient ID: [name], male   DOB: 1966-04-12, 51 y.o.   MRN: [id number]   07/14/17.  [name] is a 51 y.o. male admitted for CIR with functionalcognitive and mobility deficits secondary to TBI and SCI.  Past Medical History:  Diagnosis Date  . Chronic back pain   . History of suicidal ideation 01/2017   with overdose/ [facility] admission  . Lumbago with sciatica    left side  . Vitamin D deficiency      Subjective: No new complaints. No new problems.Still having some discomfort involving the right lateral ankle, but this has improved with local ice application.  Objective: Vital signs in last 24 hours: Temp:  [97.7 F (36.5 C)-98.2 F (36.8 C)] 97.7 F (36.5 C) (11/04 0432) Pulse Rate:  [69-91] 69 (11/04 0432) Resp:  [16-18] 16 (11/04 0432) BP: (118-131)/(75-80) 118/80 (11/04 0432) SpO2:  [96 %-98 %] 96 % (11/04 0432) Weight:  [192 lb 0.3 oz (87.1 kg)] 192 lb 0.3 oz (87.1 kg) (11/04 0432) Weight change: -2.9 oz (-2.803 kg) Last BM Date: 07/13/17  Intake/Output from previous day: 11/03 0701 - 11/04 0700 In: 644 [P.O.:644] Out: 2100 [Urine:2100]   Physical Exam General: No apparent distress   HEENT: cervical collar in place Lungs: Normal effort. Lungs clear to auscultation, no crackles or wheezes. Cardiovascular: Regular rate and rhythm, no edema Abdomen: S/NT/ND; BS(+) Musculoskeletal:  Unchanged.  Still some mild tenderness over the right lateral malleolus Neurological: No new neurological deficits with tetraplegia Wounds: N/A    Skin: clear   Mental state: Alert, oriented, cooperative    Lab Results: BMET    Component Value Date/Time   NA 137 07/01/2017 1858   NA 138 11/02/2015 0931   K 3.8 07/01/2017 1858   CL 100 (L) 07/01/2017 1858   CO2 30 07/01/2017 1858   GLUCOSE 154 (H) 07/01/2017 1858   BUN 10 07/01/2017 1858   BUN 13 11/02/2015 0931   CREATININE 0.67 07/01/2017 1858   CALCIUM 10.0 07/01/2017 1858   GFRNONAA >60 07/01/2017  1858   GFRAA >60 07/01/2017 1858   CBC    Component Value Date/Time   WBC 7.5 06/30/2017 1309   RBC 4.03 (L) 06/30/2017 1309   HGB 10.9 (L) 06/30/2017 1309   HGB 14.6 11/02/2015 0931   HCT 33.4 (L) 06/30/2017 1309   HCT 44.1 11/02/2015 0931   PLT 238 06/30/2017 1309   PLT 243 11/02/2015 0931   MCV 82.9 06/30/2017 1309   MCV 85 11/02/2015 0931   MCH 27.0 06/30/2017 1309   MCHC 32.6 06/30/2017 1309   RDW 15.9 (H) 06/30/2017 1309   RDW 14.9 11/02/2015 0931   LYMPHSABS 1.9 06/18/2017 0521   LYMPHSABS 2.7 11/02/2015 0931   MONOABS 0.8 06/18/2017 0521   EOSABS 0.2 06/18/2017 0521   EOSABS 0.2 11/02/2015 0931   BASOSABS 0.1 06/18/2017 0521   BASOSABS 0.0 11/02/2015 0931    Medications: I have reviewed the patient's current medications.  Assessment/Plan:  Status post TBI with C4 SCI with cognitive and mobility deficits.  Continue CIR Chronic neck pain.  Continue present analgesic regimen Status post multitrauma Skin/ound care.  Continue air mattress overlay Neurogenic bladder    Length of stay, days: [place] , MD 07/14/2017, 8:32 AM

## 2017-07-14 NOTE — Progress Notes (Signed)
Physical Therapy Session Note  Patient Details  Name: [name] MRN: [id number] Date of Birth: 1966/01/04  Today's Date: 07/14/2017 PT Individual Time: 1100-1200 PT Individual Time Calculation (min): 60 min   Short Term Goals: Week 7:  PT Short Term Goal 1 (Week 7): Pt will demonstrate rolling R/L consistent modA with bed features  PT Short Term Goal 2 (Week 7): Pt will demonstrate supine <>sit with modA PT Short Term Goal 3 (Week 7): Pt will perform sit <>stand modA maintaining NWB LLE PT Short Term Goal 4 (Week 7): Pt will propel w/c x100' with BUE and minA  Skilled Therapeutic Interventions/Progress Updates:    Pt received in gym from OT, seated in power chair and denies pain. Pt performed sit<>stands x 3 with RW from w/c and mod assist, in standing working on lateral weightshifts and hip/knee extension, tactile cues for L LE. Pt performed x 2 stand pivot transfers from w/c<>mat using RW, manual facilitation for lateral weightshifts and verbal cues for foot placement, max assist. Pt ambulated x 10 ft in parallel bars with max assist, manual facilitation for B hip extension, lateral weightshifts and L knee extension during stance, verbal cues for increased R step length. Pt ambulated x 10 ft using eva walker and max assist. Pt performed power w/c mobility throughout unit and appropriately setting up w/c for transfers, Mod I. Pt left seated in power w/c with needs in reach.   Therapy Documentation Precautions:  Precautions Precautions: Fall, Cervical Required Braces or Orthoses: Cervical Brace Cervical Brace: Hard collar, At all times Other Brace/Splint: cam boot R LE; cam boot LLE, B resting hand splints for night use Restrictions Weight Bearing Restrictions: Yes RLE Weight Bearing: Weight bearing as tolerated(with cam boot OOB) LLE Weight Bearing: Weight bearing as tolerated(with cam boot when OOB)   See Function Navigator for Current Functional  Status.   Therapy/Group: Individual Therapy  Netta Corrigan, PT, DPT 07/14/2017, 7:59 AM

## 2017-07-15 ENCOUNTER — Inpatient Hospital Stay (HOSPITAL_COMMUNITY): Payer: Self-pay | Admitting: Physical Therapy

## 2017-07-15 ENCOUNTER — Other Ambulatory Visit: Payer: Self-pay

## 2017-07-15 ENCOUNTER — Inpatient Hospital Stay (HOSPITAL_COMMUNITY): Payer: Self-pay | Admitting: Occupational Therapy

## 2017-07-15 ENCOUNTER — Inpatient Hospital Stay (HOSPITAL_COMMUNITY): Payer: BC Managed Care – PPO | Admitting: Occupational Therapy

## 2017-07-15 NOTE — Progress Notes (Signed)
Physical Therapy Session Note  Patient Details  Name: [name] MRN: [id number] Date of Birth: July 25, 1966  Today's Date: 07/15/2017 PT Individual Time: 1415-1530 and 1015-1100 PT Individual Time Calculation (min): 75 min and 45 min (total 120 min)   Short Term Goals: No short term goals set  Skilled Therapeutic Interventions/Progress Updates: Tx 1: Pt received seated in w/c, denies pain and agreeable to treatment. Pt requests to return to bed d/t urgency to have bowel movement. Stand pivot transfer maxA with RW, improved LLE weight bearing and stance control compared to previous sessions with this therapist. Sit >supine modA. Rolling R/L to place/remove bedpan and brief with minA. TotalA hygiene and Probation officer. Pt performs supine>sit with S and HOB elevated with increased time. Improving lumbopelvic dissociation and coordination for scooting to EOB. Sit >stand modA from bed with RW; pt attempts pulling up pants with LUE with min guard for balance. Stand pivot transfer back to w/c with maxA; increased assist needed for LLE stance control, cues for upright posture. W/c management in power chair with S throughout unit; occasional cueing for safety in tight spaces and to reduce speed in narrow openings. Remained seated in w/c at end of session, NT to escort pt back to room in power chair.   Tx 2: Pt received with handoff from nursing, denies pain and agreeable to treatment. Pt's wife present for session for ongoing training. Performed stand pivot transfer w/c>mat table with RW and maxA for LLE stance control and postural alignment. Standing RLE forward/backward taps to line on floor for focus on LLE stance control, weight shifting; performed 3 sets of 5-10 steps per trial until form decomposition d/t fatigue. Gait in parallel bars x10' including 180 degree turn, required mod/maxA for LLE stance control, cues for anterior and L lateral weight shift for stance on L, cues for upright  posture. Pt navigated power w/c to sink in gym to wash hands; required assist to reach paper towels d/t L shoulder strength/coordination deficits. Returned to room with pt driving power w/c with S. Pt declines getting back in bed at this time. Demonstrated LE stretches to pt's wife; pt's wife returned demonstration on opposite LE. Reinforced education regarding pressure relief in w/c. Discussed bed height important for level transfers and reduced caregiver burden. Remained seated in w/c at end of session, wife present and all needs in reach.      Therapy Documentation Precautions:  Precautions Precautions: Fall, Cervical Required Braces or Orthoses: Cervical Brace Cervical Brace: Hard collar, At all times Other Brace/Splint: cam boot R LE; cam boot LLE, B resting hand splints for night use Restrictions Weight Bearing Restrictions: Yes RLE Weight Bearing: Weight bearing as tolerated(with cam boot OOB) LLE Weight Bearing: Weight bearing as tolerated(with cam boot when OOB)   See Function Navigator for Current Functional Status.   Therapy/Group: Individual Therapy  [name] 07/15/2017, 3:33 PM

## 2017-07-15 NOTE — Progress Notes (Signed)
[place] PHYSICAL MEDICINE & REHABILITATION     PROGRESS NOTE    Subjective/Complaints: Up in chair. No new issues over weekend.  Objective: Vital Signs: Blood pressure 109/73, pulse 89, temperature 97.6 F (36.4 C), temperature source Oral, resp. rate 16, height 5\' 6"  (1.676 m), weight 87.9 kg (193 lb 12.6 oz), SpO2 96 %. No results found. No results for input(s): WBC, HGB, HCT, PLT in the last 72 hours. No results for input(s): NA, K, CL, GLUCOSE, BUN, CREATININE, CALCIUM in the last 72 hours.  Invalid input(s): CO CBG (last 3)  No results for input(s): GLUCAP in the last 72 hours.  Wt Readings from Last 3 Encounters:  07/15/17 87.9 kg (193 lb 12.6 oz)  05/21/17 99 kg (218 lb 3.2 oz)  01/11/17 91.6 kg (202 lb)    Physical Exam:  Constitutional: He appears well-developed.  HENT: Normocephalic. Atraumatic. Eyes: lateral eye movement remains restricted. ?new slceral hemorrhage OD, OS improving Neck:  Immobilized by Aspen collar  Cardiovascular: RRR Respiratory: CTA B GI: BS +, non-tender, non-distended  Musculoskeletal: minimal edema Neurological:  Alert Motor  2 to 2+/5 prox to distal B/L UE's. Left side remains little stronger than right Left III, VI nerve weakness . III -- some improvement Right VI deficit--no CN changes B/l LE 2+/5 HF. RLE 2-3/5 ADF/PF Skin: Skin is warmand dry.  scars.    Psychiatric: pleasant  Assessment/Plan: 1. Functional deficits, tetraplegia secondary to TBI/cervical SCI which require 3+ hours per day of interdisciplinary therapy in a comprehensive inpatient rehab setting. Physiatrist is providing close team supervision and 24 hour management of active medical problems listed below. Physiatrist and rehab team continue to assess barriers to discharge/monitor patient progress toward functional and medical goals.  Function:  Bathing Bathing position Bathing activity did not occur: N/A Position: Bed  Bathing parts   Body parts bathed by  helper: Right arm, Left arm, Chest, Abdomen, Front perineal area, Buttocks, Right upper leg, Left upper leg, Right lower leg, Left lower leg, Back  Bathing assist Assist Level: 2 helpers      Upper Body Dressing/Undressing Upper body dressing   What is the patient wearing?: Pull over shirt/dress     Pull over shirt/dress - Perfomed by patient: Thread/unthread right sleeve, Thread/unthread left sleeve, Pull shirt over trunk Pull over shirt/dress - Perfomed by helper: Thread/unthread right sleeve, Thread/unthread left sleeve, Put head through opening, Pull shirt over trunk   Button up shirt - Perfomed by helper: Thread/unthread right sleeve, Thread/unthread left sleeve, Pull shirt around back, Button/unbutton shirt    Upper body assist Assist Level: 2 helpers      Lower Body Dressing/Undressing Lower body dressing   What is the patient wearing?: Pants       Pants- Performed by helper: Thread/unthread right pants leg, Thread/unthread left pants leg, Pull pants up/down                      Lower body assist Assist for lower body dressing: 2 Helpers      Toileting Toileting Toileting activity did not occur: No continent bowel/bladder event   Toileting steps completed by helper: Adjust clothing prior to toileting, Performs perineal hygiene, Adjust clothing after toileting    Toileting assist Assist level: (totalA)   Transfers Chair/bed transfer Chair/bed transfer activity did not occur: N/A Chair/bed transfer method: Stand pivot Chair/bed transfer assist level: Maximal assist (Pt 25 - 49%/lift and lower) Chair/bed transfer assistive device: Environmental manager lift: [employer]  Ambulation Ambulation activity did not occur: Safety/medical concerns   Max distance: 10 Assist level: Maximal assist (Pt 25 - 49%)   Wheelchair Wheelchair activity did not occur: Safety/medical concerns Type: Motorized Max wheelchair distance: 255ft  Assist Level: Supervision or  verbal cues  Cognition Comprehension Comprehension assist level: Understands basic 90% of the time/cues < 10% of the time  Expression Expression assist level: Expresses basic 90% of the time/requires cueing < 10% of the time.  Social Interaction Social Interaction assist level: Interacts appropriately 90% of the time - Needs monitoring or encouragement for participation or interaction.  Problem Solving Problem solving assist level: Solves basic 75 - 89% of the time/requires cueing 10 - 24% of the time  Memory Memory assist level: Recognizes or recalls 90% of the time/requires cueing < 10% of the time   Medical Problem List and Plan: 1. Functional, cognitive and mobility deficitssecondary to Moderate TBI, C4 spinal cord injury -Cont CIR   -now WBAT BLE. Work toward dc at end of week. ?powered w/c     -s  collar will need to stay on a minimum of 12 weeks given location and severity of injury. Follow up xrays  show good alignment 2. DVT Prophylaxis/Anticoagulation: Pharmaceutical: Lovenox 3. Chronic neck pain/Pain Management: On Gabapentin bid   -changed hydrocodone to oxycodone for breakthrough pain  - fentanyl patch  23mcg/hr    -Headaches are primarly related to occipital-cervical fractures  -can tape/pad collar to prevent it from rubbing on neck   -can work up to pain tolerance RLE 4. Mood: LCSW to follow for evaluation and support when appropriate.  5. Neuropsych: This patient is notcapable of making decisions on hisown behalf.  -continue deficits in memory/insight 6. Skin/Wound Care: Air mattress overlay. Maintain adequate nutritional and hydration status.  7. Fluids/Electrolytes/Nutrition: Monitor I/O.    -diet advanced to regular/thins.    -replacing potassium 9. Left periorbital cellulitis/ CN III/VI palsy:  -continue local care 10.Left femur fracture s/p IM nail: now WBAT bilateral LE's  -peristent left foot drop 11. Left open tibial fracture s/p I and D with  ORIF: NWB--CAM boot.  11. Left mandibular fracture s/p CR /tongue laceration:   -wires removed, hardware completely removed  -reg diet 12. Chemosis left eye: Continue Lacrilube every 3 hours till resolved per opthalmology.  -  optho recommends ongoing consvt mgt   - drops/lubricant left eye  -scleral hemorrhage OD, consvt mgt, resolving 13. Facial cellulitis: keflex was completed  15. Thrombocytosis: Likely reactive.    16. ABLA:   Hemoglobin 10.9 10/21    17. Abnormal LFTs: Question due to shocked liver.     1. H/o of MDD: Was on Trintellix 5 mg and Risperidone 5 mg bid at home--continue to hold for now.   -behavior reasonable at present 19. Urine retention/neurogenic bladder: foley out. Beginning to have some sense of emptying  -continue  I/O caths   -urecholine with minimal spontaneous emptying despite 50mg  urecholine   LOS (Days) 55 A FACE TO FACE EVALUATION WAS PERFORMED  Meredith Staggers, MD 07/15/2017 8:50 AM

## 2017-07-15 NOTE — Progress Notes (Signed)
Patient had bladder scan around 5am for 235ml. Advised patient it has to be around 350 ml to cath, but patient insisted and requested to be cathed . Educated client of risk. Client said he will take the risk and wants to be cathed now. He said his bladder feels full and hurts.

## 2017-07-15 NOTE — Progress Notes (Signed)
Occupational Therapy Session Note  Patient Details  Name: [name] MRN: [id number] Date of Birth: July 08, 1966  Today's Date: 07/15/2017 OT Individual Time: 5809-9833 OT Individual Time Calculation (min): 58 min    Short Term Goals: Week 7:  OT Short Term Goal 1 (Week 7): STG=LTG secondary to ELOS  Skilled Therapeutic Interventions/Progress Updates:    Upon entering the room, pt supine in bed with wife present for family education. Pt performed supine >sit with min A for trunk to EOB and use of bed rails. Pt directing caregiver for slide board transfer from bed > motor wheelchair. Caregiver required cues for proper board placement and safety awareness. Pt drove wheelchair to gym for wheelchair <> mat slide board transfer with wife providing assistance again. Caregiver technique improved with cues for wife to facilitate greater control by being closer to pt during transfer. Pt with c/o feeling uncomfortable and requesting bladder scan. Pt returned to room and scanned by RN while therapist set up lunch tray. Caregiver assisting pt with meal for time management. Call bell and all needed items within reach upon exiting the room.   Therapy Documentation Precautions:  Precautions Precautions: Fall, Cervical Required Braces or Orthoses: Cervical Brace Cervical Brace: Hard collar, At all times Other Brace/Splint: cam boot R LE; cam boot LLE, B resting hand splints for night use Restrictions Weight Bearing Restrictions: Yes RLE Weight Bearing: Weight bearing as tolerated(with cam boot OOB) LLE Weight Bearing: Weight bearing as tolerated(with cam boot when OOB) Pain: Pain Assessment Pain Assessment: No/denies pain Pain Score: 3   See Function Navigator for Current Functional Status.   Therapy/Group: Individual Therapy  Gypsy Decant 07/15/2017, 2:08 PM

## 2017-07-15 NOTE — Progress Notes (Signed)
Occupational Therapy Session Note  Patient Details  Name: [name] MRN: [id number] Date of Birth: Jul 11, 1966  Today's Date: 07/15/2017 OT Individual Time: 7824-2353 OT Individual Time Calculation (min): 62 min   Skilled Therapeutic Interventions/Progress Updates:    Tx focus on ADL retraining, NMR, and UE strengthening during self care tasks.   Pt greeted supine in bed, finishing being cathed by RN and NT. Agreeable to get dressed. Pt donning shorts bedlevel and assisted with bilateral UEs to elevate pants up to thighs. CAM boots were donned by OT. Pt completing supine<sit with Mod A and increased time, cues for scooting Rt hip forward for neutral alignment. Max cues for proper hand placement on RW during sit<stand (pt adamant regarding pulling up in RW). Today he was able to power up with Rt hand on RW and L UE pushing up from mattress. He assisted OT with lifting his pants over hips. Pt then completed slideboard transfer to powe w/c with Min A for stabilizing board and increased time. Pt completing oral care/grooming tasks w/c level at sink with [facility] assist for managing faucet and bilaterally integrating UEs. Worked on proximal strengthening with applied resistance to bieps/triceps/deltoids in gravity minimized positions. Pt able to activate these muscles for short pulses before fatiguing, Lt strength >Rt strength. At end of tx pt was left in power w/c with all needs within reach, inquiring OT to ask NT if he could be fed breakfast.   Throughout tx pt often reporting "forget it" when asked if he could push himself a little harder during grooming tasks. Pt able to elevate bilateral UEs for deoderant and lotion application but refused to attempt these tasks himself with [facility] assist.   Spouse was not present for caregiver training during tx.   Therapy Documentation Precautions:  Precautions Precautions: Fall, Cervical Required Braces or Orthoses: Cervical Brace Cervical Brace: Hard  collar, At all times Other Brace/Splint: cam boot R LE; cam boot LLE, B resting hand splints for night use Restrictions Weight Bearing Restrictions: Yes RLE Weight Bearing: Weight bearing as tolerated(with cam boot OOB) LLE Weight Bearing: Weight bearing as tolerated(with cam boot when OOB) Pain: Pain Assessment Pain Assessment: No/denies pain ADL:      See Function Navigator for Current Functional Status.   Therapy/Group: Individual Therapy  [name] 07/15/2017, 12:54 PM

## 2017-07-16 ENCOUNTER — Inpatient Hospital Stay (HOSPITAL_COMMUNITY): Payer: Self-pay | Admitting: Physical Therapy

## 2017-07-16 ENCOUNTER — Inpatient Hospital Stay (HOSPITAL_COMMUNITY): Payer: Self-pay

## 2017-07-16 NOTE — Progress Notes (Signed)
Occupational Therapy Session Note  Patient Details  Name: Jahlil Ziller MRN: [id number] Date of Birth: 23-Jun-1966  Today's Date: 07/16/2017 OT Individual Time: 0700-0800 OT Individual Time Calculation (min): 60 min    Short Term Goals: Week 7:  OT Short Term Goal 1 (Week 7): STG=LTG secondary to ELOS  Skilled Therapeutic Interventions/Progress Updates:    Pt resting in bed upon arrival.  Focus on bed mobility, sitting balance EOB for dressing tasks, slide board transfers, grooming, and w/c mobility.  Pt required min A for supine>sit EOB.  Pt remained sitting EOB for dressing tasks.  Pt required tot A for LB dressing tasks but stood from EOB with min A to facilitate pulling pants over hips.  Pt elected to don button up shirt and required mod A this morning.  Pt performed slide board transfer to w/c with min A.  Pt completed washing face at sink and rinsing mouth with min A.  Pt remained in w/c with all needs within reach and timer set to remind pt about pressure relief.   Therapy Documentation Precautions:  Precautions Precautions: Fall, Cervical Required Braces or Orthoses: Cervical Brace Cervical Brace: Hard collar, At all times Other Brace/Splint: cam boot R LE; cam boot LLE, B resting hand splints for night use Restrictions Weight Bearing Restrictions: Yes RLE Weight Bearing: Weight bearing as tolerated LLE Weight Bearing: Weight bearing as tolerated Pain: Pain Assessment Pain Assessment: No/denies pain Pain Score: 1   See Function Navigator for Current Functional Status.   Therapy/Group: Individual Therapy  Leroy Libman 07/16/2017, 10:25 AM
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
## 2017-07-16 NOTE — Progress Notes (Signed)
Physical Therapy Session Note  Patient Details  Name: [name] MRN: [id number] Date of Birth: 27-Sep-1965  Today's Date: 07/16/2017 PT Individual Time: 0800-0900 PT Individual Time Calculation (min): 60 min    Skilled Therapeutic Interventions/Progress Updates: Pt presented in power chair agreeable to therapy. Pt c/o some R ankle pain however refusing any pain meds at present agreeable to ice after session if still painful. Pt transported self to rehab gym. Performed SB transfer to mat modA with pt clearly instructing PTA of SB placement and positioning. Pt performed wt shifting activities with EVA walker with focus on maintaining LLE neutral position and quad activation. Pt performed TKE in standing with LLE x 10, and RLE hip flexion x 10 for increased wt shift to L with PTA blocking L knee. Mod multimodal cues provided to increased glute activation for improved posture. Pt ambulated in parallel bars 29f x 2 with PTA blocking L knee with mod cues for erect posture. Pt returned to power chair via SB transfer in same manner as prior and returned to room with call bell within reach and needs met.      Therapy Documentation Precautions:  Precautions Precautions: Fall, Cervical Required Braces or Orthoses: Cervical Brace Cervical Brace: Hard collar, At all times Other Brace/Splint: cam boot R LE; cam boot LLE, B resting hand splints for night use Restrictions Weight Bearing Restrictions: Yes RLE Weight Bearing: Weight bearing as tolerated LLE Weight Bearing: Weight bearing as tolerated General:   Vital Signs:   Pain: Pain Assessment Pain Score: 1    See Function Navigator for Current Functional Status.   Therapy/Group: Individual Therapy  Laden Fieldhouse  Dulcemaria Bula, PTA  07/16/2017, 9:10 AM

## 2017-07-16 NOTE — Progress Notes (Signed)
Occupational Therapy Note  Patient Details  Name: [name] MRN: [id number] Date of Birth: 1966/07/01  Today's Date: 07/16/2017 OT Individual Time: 1100-1145 OT Individual Time Calculation (min): 45 min  and Today's Date: 07/16/2017 OT Missed Time: 15 Minutes Missed Time Reason: Pain  Pt c/o R ankle pain; repositioned and ice applied Individual Therapy  Pt resting in power w/c upon arrival.  Pt c/o increased pain in R ankle and "swelliing" in L knee.  Pt requested to return to bed.  Focus on slide board transfers, bed mobility, and directing care.  Pt required min A for slide board transfer and rolling in bed.  Pt required max A for sit>supine in bed.  Ice applied to R ankle.  Pt remained in bed with all needs within reach.    [name] [facility] 07/16/2017, 12:14 PM

## 2017-07-16 NOTE — Progress Notes (Signed)
Physical Therapy Session Note  Patient Details  Name: [name] MRN: [id number] Date of Birth: 1966-08-04  Today's Date: 07/16/2017 PT Individual Time: 1015-1045, 1500-1530 PT Individual Time Calculation (min): 30 min, 30 min   Short Term Goals: Week 7:  PT Short Term Goal 1 (Week 7): Pt will demonstrate rolling R/L consistent modA with bed features  PT Short Term Goal 2 (Week 7): Pt will demonstrate supine <>sit with modA PT Short Term Goal 3 (Week 7): Pt will perform sit <>stand modA maintaining NWB LLE PT Short Term Goal 4 (Week 7): Pt will propel w/c x100' with BUE and minA  Skilled Therapeutic Interventions/Progress Updates:    Session 1: Pt seating in TIS w/c upon PT arrival, agreeable to therapy tx and denies pain. Pt performed w/c mobility from room<>gym Mod I. When driving power chair to parallel bars, pt ran into wall and required verbal cues to correct. Pt performed x 3 sit<>stands in parallel bars with mod assist, in standing worked on lateral weightshifts and stepping in place. Pt reported L ankle and knee pain 8/10 and decliner further standing, RN made aware. Pt performed slideboard transfer from chair<>mat with emphasis explaining to therapist how to perform transfer, step by step. Pt left seated in w/c at end of session with needs in reach.    Session 2: Pt received in gym from PT, seated in TIS w/c and agreeable to therapy tx, denies pain. Session focused on stand pivot transfers. Pt performed x 2 stand pivot transfers from w/c<>mat mod assist, verbal cues for technique and weightshifts. Pt performed w/c mobility back to room mod I. Pt performed stand pivot transfer from w/c>bed with mod assist, verbal cues for technique and sequencing. Pt transferred from sitting>supine with min assist. Pt refused to scoot up in bed, stating his knee/ankles were too sore, requested NT to come help boost him up in bed. Therapist explained the importance of trying to bridge and push  himself up, pt continued to refuse. Pt left supine in bed at end of session with needs in reach.    Therapy Documentation Precautions:  Precautions Precautions: Fall, Cervical Required Braces or Orthoses: Cervical Brace Cervical Brace: Hard collar, At all times Other Brace/Splint: cam boot R LE; cam boot LLE, B resting hand splints for night use Restrictions Weight Bearing Restrictions: Yes RLE Weight Bearing: Weight bearing as tolerated LLE Weight Bearing: Weight bearing as tolerated   See Function Navigator for Current Functional Status.   Therapy/Group: Individual Therapy  Netta Corrigan, PT, DPT 07/16/2017, 7:50 AM

## 2017-07-16 NOTE — Progress Notes (Addendum)
[place] PHYSICAL MEDICINE & REHABILITATION     PROGRESS NOTE    Subjective/Complaints: Asked if there is something I can give him for left foot pain.  States that he has emptied his bladder 2-3 times but generally it has been incontinence.  No spontaneous emptying yesterday  ROS: pt denies nausea, vomiting, diarrhea, cough, shortness of breath or chest pain    Objective: Vital Signs: Blood pressure 115/76, pulse 85, temperature 99.5 F (37.5 C), temperature source Oral, resp. rate 16, height 5\' 6"  (1.676 m), weight 87.9 kg (193 lb 12.6 oz), SpO2 96 %. No results found. No results for input(s): WBC, HGB, HCT, PLT in the last 72 hours. No results for input(s): NA, K, CL, GLUCOSE, BUN, CREATININE, CALCIUM in the last 72 hours.  Invalid input(s): CO CBG (last 3)  No results for input(s): GLUCAP in the last 72 hours.  Wt Readings from Last 3 Encounters:  07/15/17 87.9 kg (193 lb 12.6 oz)  05/21/17 99 kg (218 lb 3.2 oz)  01/11/17 91.6 kg (202 lb)    Physical Exam:  Constitutional: He appears well-developed.  HENT: Normocephalic. Atraumatic. Eyes: lateral eye movement remains restricted.  slceral hemorrhage OD and OS improving Neck:  Immobilized by Aspen collar  Cardiovascular:  RRR Respiratory: CTA B GI: BS +, non-tender, non-distended  Musculoskeletal: minimal edema Neurological:  Alert Motor   left upper extremity and right upper extremity grossly 2 to 2+ out of 5 proximal to distal.  Left side remains a bit stronger than the right.  Left III, VI nerve weakness . III --   Right VI deficit--no CN changes B/l LE 2+/5 HF. RLE 2-3/5 ADF/PF Skin: Skin is warmand dry.  scars.    Psychiatric: pleasant and cooperative  Assessment/Plan: 1. Functional deficits, tetraplegia secondary to TBI/cervical SCI which require 3+ hours per day of interdisciplinary therapy in a comprehensive inpatient rehab setting. Physiatrist is providing close team supervision and 24 hour management of  active medical problems listed below. Physiatrist and rehab team continue to assess barriers to discharge/monitor patient progress toward functional and medical goals.  Function:  Bathing Bathing position Bathing activity did not occur: N/A Position: Bed  Bathing parts   Body parts bathed by helper: Right arm, Right lower leg, Left lower leg, Left arm, Chest, Abdomen, Back, Front perineal area, Right upper leg, Buttocks, Left upper leg  Bathing assist Assist Level: 2 helpers      Upper Body Dressing/Undressing Upper body dressing   What is the patient wearing?: [place] over shirt/dress - Perfomed by patient: Thread/unthread right sleeve, Thread/unthread left sleeve Pull over shirt/dress - Perfomed by helper: Thread/unthread right sleeve, Thread/unthread left sleeve, Put head through opening, Pull shirt over trunk   Button up shirt - Perfomed by helper: Thread/unthread right sleeve, Thread/unthread left sleeve, Pull shirt around back, Button/unbutton shirt    Upper body assist Assist Level: 2 helpers      Lower Body Dressing/Undressing Lower body dressing   What is the patient wearing?: Hospital Gown       Pants- Performed by helper: Thread/unthread right pants leg, Thread/unthread left pants leg, Pull pants up/down                      Lower body assist Assist for lower body dressing: 2 Helpers      Toileting Toileting Toileting activity did not occur: No continent bowel/bladder event   Toileting steps completed by helper: Adjust clothing prior to  toileting, Performs perineal hygiene, Adjust clothing after toileting    Toileting assist Assist level: (totalA)   Transfers Chair/bed transfer Chair/bed transfer activity did not occur: N/A Chair/bed transfer method: Stand pivot Chair/bed transfer assist level: Maximal assist (Pt 25 - 49%/lift and lower) Chair/bed transfer assistive device: Walker Mechanical lift: Maximove   Locomotion Ambulation  Ambulation activity did not occur: Safety/medical concerns   Max distance: 10 Assist level: Maximal assist (Pt 25 - 49%)   Wheelchair Wheelchair activity did not occur: Safety/medical concerns Type: Motorized Max wheelchair distance: 228ft  Assist Level: Supervision or verbal cues  Cognition Comprehension Comprehension assist level: Follows complex conversation/direction with no assist  Expression Expression assist level: Expresses complex ideas: With no assist  Social Interaction Social Interaction assist level: Interacts appropriately with others - No medications needed.  Problem Solving Problem solving assist level: Solves basic 90% of the time/requires cueing < 10% of the time  Memory Memory assist level: Recognizes or recalls 90% of the time/requires cueing < 10% of the time   Medical Problem List and Plan: 1. Functional, cognitive and mobility deficitssecondary to Moderate TBI, C4 spinal cord injury -Cont CIR. team conference today to discuss d/c plan  -now WBAT BLE. Work toward dc at end of week. ?powered w/c     -s  collar will need to stay on a minimum of 12 weeks given location and severity of injury. Follow up xrays  show good alignment 2. DVT Prophylaxis/Anticoagulation: Pharmaceutical: Lovenox 3. Chronic neck pain/Pain Management: On Gabapentin bid   -changed hydrocodone to oxycodone for breakthrough pain  - fentanyl patch  72mcg/hr   -Reiterated that he needs to keep his boots on tight to provide additional support for his ankles and feet.  I am not going to increase his pain medication any further as he is on a great deal of medication as is. 4. Mood: LCSW to follow for evaluation and support when appropriate.  5. Neuropsych: This patient is notcapable of making decisions on hisown behalf.  -continue deficits in memory/insight 6. Skin/Wound Care: Air mattress overlay. Maintain adequate nutritional and hydration status.  7. Fluids/Electrolytes/Nutrition:  Monitor I/O.    -diet advanced to regular/thins.    -replacing potassium 9. Left periorbital cellulitis/ CN III/VI palsy:  -continue local care 10.Left femur fracture s/p IM nail: now WBAT bilateral LE's  -peristent left foot drop 11. Left open tibial fracture s/p I and D with ORIF: NWB--CAM boot.  11. Left mandibular fracture s/p CR /tongue laceration:   - hardware completely removed  -reg diet 12. Chemosis left eye: Continue Lacrilube every 3 hours till resolved per opthalmology.  -  optho recommends ongoing consvt mgt   - drops/lubricant left eye  -scleral hemorrhage OD, consvt mgt, resolving 13. Facial cellulitis: keflex was completed  15. Thrombocytosis: Likely reactive.    16. ABLA:   Hemoglobin 10.9 10/21    17. Abnormal LFTs: Question due to shocked liver.     45. H/o of MDD: Was on Trintellix 5 mg and Risperidone 5 mg bid at home--continue to hold for now.   -behavior reasonable at present 19. Urine retention/neurogenic bladder: foley out. Beginning to have some sense of emptying  -continue  I/O caths   -The patient wishes to continue with the 50 mg of Urecholine for now at least until he goes home.  He will likely need to go home with a Foley catheter   Mobility Assessment  Mr. [name] was seen today for the purpose of a mobility  assessment for a powered wheelchair. I have reviewed and agree with the detailed PT evaluation. He suffers from tetraplegia related to a cervical spinal cord injury. Due to his tetraplegia, he is unable to utilize a cane, walker, manual wheelchair, or scooter. The patient is appropriate for a customized power wheelchair.  With a power wheelchair  he can move independently at a household level and on a limited basis in the community. The chair will also allow the patient to perform ADL's more easily. The patient is competent to operate the recommended chair on his own and is motivated to utilize the chair on a daily basis.     Meredith Staggers, MD, [place] (Days) [place], MD 07/16/2017 8:51 AM

## 2017-07-16 NOTE — Progress Notes (Addendum)
Physical Therapy Session Note  Patient Details  Name: Zyheir Daft MRN: [id number] Date of Birth: 10/25/1965  Today's Date: 07/16/2017 PT Individual Time: 9906-8934 and 1445-1500 PT Individual Time Calculation (min): 32 min and 15 min (makeup time)  Skilled Therapeutic Interventions/Progress Updates:   Session 1: Pt supine upon arrival and agreeable to therapy, no c/o pain. Worked on functional activity this session. Transferred to EOB w/ Min A and maintained static sitting balance for 20+ minutes w/ supervision while eating lunch. Worked on bimanual tasks while eating and maintaining postural control. Transferred to w/c via Max A stand pivot transfer w/ bilateral HHA. Ended session sitting up in w/c, w/c belt on and call bell within reach, all needs met.   Session 2:  Pt in w/c and agreeable to 15 minutes of make up time, no c/o pain. Pt used power w/c to gym w/ supervision. Worked on standing NMR this session. Sit<>Stand x2 w/ Min-Mod A and static standing balance w/ RW. Partial knee bends 2x5 in stance w/ tactile cues at L quad. Ended session in w/c in gym, seat belt on and tilted for pressure relief, all needs met and waiting for therapist scheduled at 3pm. Therapist made aware of pt's status.   Therapy Documentation Precautions:  Precautions Precautions: Fall, Cervical Required Braces or Orthoses: Cervical Brace Cervical Brace: Hard collar, At all times Other Brace/Splint: cam boot R LE; cam boot LLE, B resting hand splints for night use Restrictions Weight Bearing Restrictions: Yes RLE Weight Bearing: Weight bearing as tolerated LLE Weight Bearing: Weight bearing as tolerated  See Function Navigator for Current Functional Status.   Therapy/Group: Individual Therapy  Kamali Nephew K Arnette 07/16/2017, 2:38 PM

## 2017-07-17 ENCOUNTER — Inpatient Hospital Stay (HOSPITAL_COMMUNITY): Payer: Self-pay

## 2017-07-17 ENCOUNTER — Inpatient Hospital Stay (HOSPITAL_COMMUNITY): Payer: Self-pay | Admitting: Physical Therapy

## 2017-07-17 LAB — URINALYSIS, ROUTINE W REFLEX MICROSCOPIC
BILIRUBIN URINE: NEGATIVE
Glucose, UA: NEGATIVE mg/dL
HGB URINE DIPSTICK: NEGATIVE
Ketones, ur: NEGATIVE mg/dL
NITRITE: NEGATIVE
PROTEIN: NEGATIVE mg/dL
Specific Gravity, Urine: 1.017 (ref 1.005–1.030)
pH: 6 (ref 5.0–8.0)

## 2017-07-17 MED ORDER — ENSURE ENLIVE PO LIQD
237.0000 mL | Freq: Two times a day (BID) | ORAL | Status: DC
  Administered 2017-07-19 (×2): 237 mL via ORAL

## 2017-07-17 NOTE — Progress Notes (Signed)
Occupational Therapy Session Note  Patient Details  Name: [name] MRN: [id number] Date of Birth: 08-22-1966  Today's Date: 07/17/2017 OT Individual Time: 0700-0800 OT Individual Time Calculation (min): 60 min    Short Term Goals: Week 7:  OT Short Term Goal 1 (Week 7): STG=LTG secondary to ELOS  Skilled Therapeutic Interventions/Progress Updates:    Pt on bedpan on arrival.  Pt required min A for rolling in bed to remove bed pan and for hygiene.  Pt sat EOB (HOB elevated) at supervision level this morning in preparation for dressing tasks.  Pt donned pullover shirt with min A.  Pt issued reacher to LB clothing management.  Pt able to thread RLE into pants but required assistance completing task.  Pt stood from EOB with min A.  Pt performed slide board transfer to power w/c with min A.  Pt completed grooming tasks with min A while seated in w/c at sink.  Focus on bed mobility, sit<>stand, standing balance, dressing tasks, functional transfers, and safety awareness to increase independence with BADLs.   Therapy Documentation Precautions:  Precautions Precautions: Fall, Cervical Required Braces or Orthoses: Cervical Brace Cervical Brace: Hard collar, At all times Other Brace/Splint: cam boot R LE; cam boot LLE, B resting hand splints for night use Restrictions Weight Bearing Restrictions: Yes RLE Weight Bearing: Weight bearing as tolerated LLE Weight Bearing: Weight bearing as tolerated   Pain:  Pt denies pain  See Function Navigator for Current Functional Status.   Therapy/Group: Individual Therapy  Leroy Libman 07/17/2017, 9:14 AM

## 2017-07-17 NOTE — Progress Notes (Signed)
Social Work Patient ID: [name], male   DOB: April 10, 1966, 51 y.o.   MRN: [id number]   CSW met with pt and left a message for pt's wife to update them on team conference discussion and that pt is still on track for d/c on 07-19-17.  Pt is looking forward to Friday for d/c and stated that his wife will have someone from church with him while she's at work.  CSW left her a message to discuss DME, HH, and other logistics.  Also wanted to set up family education for pt's wife or other caregivers.  Await return call back from her.  CSW is making arrangements in the meantime and will continue to follow and assist as needed.

## 2017-07-17 NOTE — Progress Notes (Signed)
[place] PHYSICAL MEDICINE & REHABILITATION     PROGRESS NOTE    Subjective/Complaints: Still unclear about reasoning behind suppository. No voids yesterday.   ROS: pt denies nausea, vomiting, diarrhea, cough, shortness of breath or chest pain     Objective: Vital Signs: Blood pressure 128/87, pulse 80, temperature 97.8 F (36.6 C), temperature source Oral, resp. rate 18, height 5\' 6"  (1.676 m), weight 88.5 kg (195 lb), SpO2 98 %. No results found. No results for input(s): WBC, HGB, HCT, PLT in the last 72 hours. No results for input(s): NA, K, CL, GLUCOSE, BUN, CREATININE, CALCIUM in the last 72 hours.  Invalid input(s): CO CBG (last 3)  No results for input(s): GLUCAP in the last 72 hours.  Wt Readings from Last 3 Encounters:  07/17/17 88.5 kg (195 lb)  05/21/17 99 kg (218 lb 3.2 oz)  01/11/17 91.6 kg (202 lb)    Physical Exam:  Constitutional: He appears well-developed.  HENT: Normocephalic. Atraumatic. Eyes: lateral eye movement remains restricted.  slceral hemorrhage OD and OS improving Neck:  in Aspen collar  Cardiovascular:   RRR without murmur. No JVD  Respiratory: CTA Bilaterally without wheezes or rales. Normal effort  GI: BS +, non-tender, non-distended  Musculoskeletal: minimal edema Neurological:  Alert Motor   LUE and LLE 2+ to 3-/5.  Left III, VI nerve weakness . III ---eye movement about the same  Right VI deficit--no CN changes B/l LE 2+/5 HF. RLE 2-3/5 ADF/PF, LLE tr ADF/PF Skin: Skin is warmand dry.  scars.    Psychiatric: pleasant and cooperative  Assessment/Plan: 1. Functional deficits, tetraplegia secondary to TBI/cervical SCI which require 3+ hours per day of interdisciplinary therapy in a comprehensive inpatient rehab setting. Physiatrist is providing close team supervision and 24 hour management of active medical problems listed below. Physiatrist and rehab team continue to assess barriers to discharge/monitor patient progress toward  functional and medical goals.  Function:  Bathing Bathing position Bathing activity did not occur: N/A Position: Bed  Bathing parts   Body parts bathed by helper: Right arm, Right lower leg, Left lower leg, Left arm, Chest, Abdomen, Back, Front perineal area, Right upper leg, Buttocks, Left upper leg  Bathing assist Assist Level: 2 helpers      Upper Body Dressing/Undressing Upper body dressing   What is the patient wearing?: Button up shirt     Pull over shirt/dress - Perfomed by patient: Thread/unthread right sleeve Pull over shirt/dress - Perfomed by helper: Thread/unthread right sleeve, Thread/unthread left sleeve, Put head through opening, Pull shirt over trunk   Button up shirt - Perfomed by helper: Thread/unthread right sleeve, Thread/unthread left sleeve, Pull shirt around back, Button/unbutton shirt    Upper body assist Assist Level: 2 helpers      Lower Body Dressing/Undressing Lower body dressing   What is the patient wearing?: Hospital Gown       Pants- Performed by helper: Thread/unthread right pants leg, Thread/unthread left pants leg, Pull pants up/down                      Lower body assist Assist for lower body dressing: 2 Helpers      Toileting Toileting Toileting activity did not occur: No continent bowel/bladder event   Toileting steps completed by helper: Adjust clothing prior to toileting, Performs perineal hygiene, Adjust clothing after toileting    Toileting assist Assist level: (totalA)   Transfers Chair/bed transfer Chair/bed transfer activity did not occur: N/A Chair/bed transfer method: Stand  pivot Chair/bed transfer assist level: Maximal assist (Pt 25 - 49%/lift and lower) Chair/bed transfer assistive device: Other(bilateral HHA) Mechanical lift: Maximove   Locomotion Ambulation Ambulation activity did not occur: Safety/medical concerns   Max distance: 10 Assist level: Maximal assist (Pt 25 - 49%)   Wheelchair Wheelchair  activity did not occur: Safety/medical concerns Type: Motorized Max wheelchair distance: 216ft  Assist Level: Supervision or verbal cues  Cognition Comprehension Comprehension assist level: Follows complex conversation/direction with no assist  Expression Expression assist level: Expresses complex ideas: With no assist  Social Interaction Social Interaction assist level: Interacts appropriately with others - No medications needed.  Problem Solving Problem solving assist level: Solves basic 90% of the time/requires cueing < 10% of the time  Memory Memory assist level: Recognizes or recalls 90% of the time/requires cueing < 10% of the time   Medical Problem List and Plan: 1. Functional, cognitive and mobility deficitssecondary to Moderate TBI, C4 spinal cord injury -Cont CIR.   -now WBAT BLE. Work toward dc at end of week. ?powered w/c     -collar will need to stay on a minimum of 12 weeks given location and severity of injury. Follow up xrays  show good alignment  -FTF w/c documentation completed yesterday 2. DVT Prophylaxis/Anticoagulation: Pharmaceutical: Lovenox 3. Chronic neck pain/Pain Management: On Gabapentin bid   -changed hydrocodone to oxycodone for breakthrough pain  - fentanyl patch  64mcg/hr   -needs regular reassurance 4. Mood: LCSW to follow for evaluation and support when appropriate.  5. Neuropsych: This patient is notcapable of making decisions on hisown behalf.  -continue deficits in memory/insight 6. Skin/Wound Care: Air mattress overlay. Maintain adequate nutritional and hydration status.  7. Fluids/Electrolytes/Nutrition: Monitor I/O.    -diet advanced to regular/thins.    -replacing potassium 9. Left periorbital cellulitis/ CN III/VI palsy:  -continue local care 10.Left femur fracture s/p IM nail: now WBAT bilateral LE's  -peristent left foot drop 11. Left open tibial fracture s/p I and D with ORIF: NWB--CAM boot.  11. Left mandibular fracture  s/p CR /tongue laceration:   - hardware completely removed  -reg diet 12. Chemosis left eye: Continue Lacrilube every 3 hours till resolved per opthalmology.  -  optho recommends ongoing consvt mgt   - drops/lubricant left eye  -scleral hemorrhage OD, consvt mgt, resolving 13. Facial cellulitis: keflex was completed  15. Thrombocytosis: Likely reactive.    16. ABLA:   Hemoglobin 10.9 10/21    17. Abnormal LFTs: Question due to shocked liver.     61. H/o of MDD: Was on Trintellix 5 mg and Risperidone 5 mg bid at home--continue to hold for now.   -behavior reasonable at present 19. Urine retention/neurogenic bladder: foley out. Beginning to have some sense of emptying  -continue  I/O caths   -The patient wishes to continue with the 50 mg of Urecholine for now at least until he goes home.  He will likely need to go home with a Foley catheter. We agreed to continue thru today, and if no results, will stop in anticipation of foley cath at home.           LOS (Days) 57 A FACE TO FACE EVALUATION WAS PERFORMED  Meredith Staggers, MD 07/17/2017 8:48 AM
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
## 2017-07-17 NOTE — Progress Notes (Signed)
Occupational Therapy Note  Patient Details  Name: [name] MRN: [id number] Date of Birth: [birth year]  Today's Date: 07/17/2017 OT Individual Time: 1100-1200 OT Individual Time Calculation (min): 60 min   Pt denies pain Individual Therapy  Pt practiced stand pivot transfers w/c<>padded tub bench with cutout. Pt practiced sit<>stand and standing balance for clothing management. Pt completed all tasks with min A and extra time to complete transfers.  Pt also engaged in bed mobility and w/c mobility.  Pt continues to required mod verbal cues for safety awareness with power w/c.  Pt returned to bed (slide board transfer min A) at end of session.     [name] [facility] 07/17/2017, 3:11 PM

## 2017-07-18 ENCOUNTER — Inpatient Hospital Stay (HOSPITAL_COMMUNITY): Payer: Self-pay | Admitting: Occupational Therapy

## 2017-07-18 ENCOUNTER — Inpatient Hospital Stay (HOSPITAL_COMMUNITY): Payer: Self-pay | Admitting: Physical Therapy

## 2017-07-18 ENCOUNTER — Inpatient Hospital Stay (HOSPITAL_COMMUNITY): Payer: Self-pay

## 2017-07-18 LAB — BASIC METABOLIC PANEL
Anion gap: 8 (ref 5–15)
BUN: 6 mg/dL (ref 6–20)
CALCIUM: 9.8 mg/dL (ref 8.9–10.3)
CO2: 27 mmol/L (ref 22–32)
CREATININE: 0.63 mg/dL (ref 0.61–1.24)
Chloride: 100 mmol/L — ABNORMAL LOW (ref 101–111)
GFR calc non Af Amer: >60 mL/min (ref >60)
Glucose, Bld: 102 mg/dL — ABNORMAL HIGH (ref 65–99)
Potassium: 4.2 mmol/L (ref 3.5–5.1)
Sodium: 135 mmol/L (ref 135–145)

## 2017-07-18 MED ORDER — FENTANYL 12 MCG/HR TD PT72
12.5000 ug | MEDICATED_PATCH | TRANSDERMAL | Status: DC
  Administered 2017-07-18: 12.5 ug via TRANSDERMAL
  Filled 2017-07-18: qty 1

## 2017-07-18 NOTE — Progress Notes (Signed)
[facility]IC[facility]ABILITATION     PROGRESS NOTE    Subjective/Complaints: Patient is up with therapy.  No pain complaints today.  No spontaneous voids noted.  Anxious to get home.  ROS: pt denies nausea, vomiting, diarrhea, cough, shortness of breath or chest pain    Objective: Vital Signs: Blood pressure 119/81, pulse 76, temperature 98.3 F (36.8 C), temperature source Oral, resp. rate 18, height 5\' 6"  (1.676 m), weight 80.2 kg (176 lb 12.8 oz), SpO2 99 %. No results found. No results for input(s): WBC, HGB, HCT, PLT in the last 72 hours. No results for input(s): NA, K, CL, GLUCOSE, BUN, CREATININE, CALCIUM in the last 72 hours.  Invalid input(s): CO CBG (last 3)  No results for input(s): GLUCAP in the last 72 hours.  Wt Readings from Last 3 Encounters:  07/18/17 80.2 kg (176 lb 12.8 oz)  05/21/17 99 kg (218 lb 3.2 oz)  01/11/17 91.6 kg (202 lb)    Physical Exam:  Constitutional: He appears well-developed.  HENT: Normocephalic. Atraumatic. Eyes: lateral eye movement remains restricted.  slceral hemorrhage OD and OS improving Neck:  in Aspen collar  Cardiovascular:  RRR without murmur. No JVD  Respiratory:  CTA Bilaterally without wheezes or rales. Normal effort   GI: BS +, non-tender, non-distended  Musculoskeletal: minimal edema Neurological:  Alert Motor   LUE and LLE 2+ to 3/5.  Left III, VI nerve weakness both showing some progress.  Right VI deficit without improvement B/l LE 2+/5 HF. RLE 2-3/5 ADF/PF, LLE tr ADF/PF--stable Skin: Skin is warmand dry.  scars.    Psychiatric: pleasant and cooperative  Assessment/Plan: 1. Functional deficits, tetraplegia secondary to TBI/cervical SCI which require 3+ hours per day of interdisciplinary therapy in a comprehensive inpatient rehab setting. Physiatrist is providing close team supervision and 24 hour management of active medical problems listed below. Physiatrist and rehab team continue to assess  barriers to discharge/monitor patient progress toward functional and medical goals.  Function:  Bathing Bathing position Bathing activity did not occur: N/A Position: Bed  Bathing parts   Body parts bathed by helper: Right arm, Right lower leg, Left lower leg, Left arm, Chest, Abdomen, Back, Front perineal area, Right upper leg, Buttocks, Left upper leg  Bathing assist Assist Level: 2 helpers      Upper Body Dressing/Undressing Upper body dressing   What is the patient wearing?: Button up shirt     Pull over shirt/dress - Perfomed by patient: Thread/unthread right sleeve, Thread/unthread left sleeve, Pull shirt over trunk Pull over shirt/dress - Perfomed by helper: Put head through opening   Button up shirt - Perfomed by helper: Thread/unthread right sleeve, Thread/unthread left sleeve, Pull shirt around back, Button/unbutton shirt    Upper body assist Assist Level: Touching or steadying assistance(Pt > 75%)      Lower Body Dressing/Undressing Lower body dressing   What is the patient wearing?: Hospital Gown     Pants- Performed by patient: Thread/unthread right pants leg Pants- Performed by helper: Thread/unthread left pants leg, Pull pants up/down                      Lower body assist Assist for lower body dressing: 2 Helpers      Toileting Toileting Toileting activity did not occur: No continent bowel/bladder event   Toileting steps completed by helper: Adjust clothing prior to toileting, Performs perineal hygiene, Adjust clothing after toileting    Toileting assist Assist level: (totalA)  Transfers Chair/bed transfer Chair/bed transfer activity did not occur: N/A Chair/bed transfer method: Stand pivot Chair/bed transfer assist level: Maximal assist (Pt 25 - 49%/lift and lower) Chair/bed transfer assistive device: Armrests, Walker Mechanical lift: Maximove   Locomotion Ambulation Ambulation activity did not occur: Safety/medical concerns   Max  distance: 10 Assist level: Maximal assist (Pt 25 - 49%)   Wheelchair Wheelchair activity did not occur: Safety/medical concerns Type: Motorized Max wheelchair distance: 263ft  Assist Level: Supervision or verbal cues  Cognition Comprehension Comprehension assist level: Understands complex 90% of the time/cues 10% of the time  Expression Expression assist level: Expresses complex 90% of the time/cues < 10% of the time  Social Interaction Social Interaction assist level: Interacts appropriately 90% of the time - Needs monitoring or encouragement for participation or interaction.  Problem Solving Problem solving assist level: Solves basic problems with no assist  Memory Memory assist level: Recognizes or recalls 90% of the time/requires cueing < 10% of the time   Medical Problem List and Plan: 1. Functional, cognitive and mobility deficitssecondary to Moderate TBI, C4 spinal cord injury -Cont CIR.   -now WBAT BLE. Work toward [employer]  -power w/c order placed    -collar will need to stay on a minimum of 12 weeks given location and severity of injury. Follow up xrays  show good alignment  -FTF w/c documentation completed yesterday 2. DVT Prophylaxis/Anticoagulation: Pharmaceutical: Lovenox 3. Chronic neck pain/Pain Management: On Gabapentin bid   -changed hydrocodone to oxycodone for breakthrough pain  - fentanyl patch  42mcg/hr --continue at home 4. Mood: LCSW to follow for evaluation and support when appropriate.  5. Neuropsych: This patient is notcapable of making decisions on hisown behalf.  -continue deficits in memory/insight 6. Skin/Wound Care: Air mattress overlay. Maintain adequate nutritional and hydration status.  7. Fluids/Electrolytes/Nutrition: Monitor I/O.    -diet advanced to regular/thins.    -replacing potassium  -recheck labs today prior to discharge tomorrow  -eating well 9. Left periorbital cellulitis/ CN III/VI palsy:  -continue local  care 10.Left femur fracture s/p IM nail: now WBAT bilateral LE's  -peristent left foot drop 11. Left open tibial fracture s/p I and D with ORIF: NWB--CAM boot.  11. Left mandibular fracture s/p CR /tongue laceration:   - hardware completely removed  -reg diet 12. Chemosis left eye: Continue Lacrilube every 3 hours till resolved per opthalmology.  -  optho recommends ongoing consvt mgt   - drops/lubricant left eye  -scleral hemorrhage OD, consvt mgt, resolving 13. Facial cellulitis: keflex was completed  15. Thrombocytosis: Likely reactive.    16. ABLA:   Hemoglobin 10.9 10/21    17. Abnormal LFTs: Question due to shocked liver.     28. H/o of MDD: Was on Trintellix 5 mg and Risperidone 5 mg bid at home--continue to hold for now.   -behavior reasonable at present 19. Urine retention/neurogenic bladder: foley out. Beginning to have some sense of emptying  -continue  I/O caths   -Discontinue Urecholine today as no signs of voluntary or involuntary emptying.  He will need to go home with a Foley catheter.  We will set up urologyl follow-up as an outpatient          LOS (Days) Unity T, MD 07/18/2017 8:24 AM

## 2017-07-18 NOTE — Progress Notes (Signed)
Occupational Therapy Session Note  Patient Details  Name: [name] MRN: [id number] Date of Birth: [birth year]  Today's Date: 07/18/2017 OT Individual Time: 0700-0800 OT Individual Time Calculation (min): 60 min    Short Term Goals: Week 7:  OT Short Term Goal 1 (Week 7): STG=LTG secondary to ELOS  Skilled Therapeutic Interventions/Progress Updates:    Focus on continued BADL retraining, bed mobility, sit<>stand, standing balance, functional transfers, and safety awareness.  Pt requires supervision for supine>sit in preparation for dressing tasks while seated EOB.  Pt required min A for sit<>stand and standing balance while pulling up pants.  Pt attempted to pull up pants but unable to complete task.  Pt required min A for SPT to manual w/c.  Pt completed grooming tasks seated in w/c at sink.  Pt pleased with progress and ready for d/c tomorrow.   Therapy Documentation Precautions:  Precautions Precautions: Fall, Cervical Required Braces or Orthoses: Cervical Brace Cervical Brace: Hard collar, At all times Other Brace/Splint: cam boot R LE; cam boot LLE, B resting hand splints for night use Restrictions Weight Bearing Restrictions: Yes RLE Weight Bearing: Weight bearing as tolerated(with cam boot when OOB) LLE Weight Bearing: Weight bearing as tolerated(with cam boot when OOB)   Pain: Pain Assessment Pain Assessment: No/denies pain  See Function Navigator for Current Functional Status.   Therapy/Group: Individual Therapy  Leroy Libman 07/18/2017, 11:27 AM

## 2017-07-18 NOTE — Progress Notes (Signed)
Physical Therapy Discharge Summary  Patient Details  Name: [name] MRN: [id number] Date of Birth: 1966-08-22  Today's Date: 07/18/2017 PT Individual Time: 0900-1000 PT Individual Time Calculation (min): 60 min    Patient has met 8 of 8 long term goals due to improved activity tolerance, improved balance, improved postural control, increased strength, increased range of motion, decreased pain, ability to compensate for deficits, functional use of  right upper extremity, right lower extremity, left upper extremity and left lower extremity, improved attention, improved awareness and improved coordination.  Patient to discharge at a wheelchair level [place].   Patient's care partner is independent to provide the necessary physical and cognitive assistance at discharge.  Reasons goals not met: All goals met  Recommendation:  Patient will benefit from ongoing skilled PT services in home health setting to continue to advance safe functional mobility, address ongoing impairments in strength, balance, endurance, coordination, and minimize fall risk.  Equipment: Power Customer service manager, transfer board  Reasons for discharge: treatment goals met and discharge from hospital  Patient/family agrees with progress made and goals achieved: Yes  PT Discharge Precautions/Restrictions Restrictions Weight Bearing Restrictions: Yes RLE Weight Bearing: Weight bearing as tolerated(with cam boot when OOB) LLE Weight Bearing: Weight bearing as tolerated(with cam boot when OOB) Pain Pain Assessment Pain Assessment: No/denies pain Pain Score: 0-No pain Vision/Perception  Vision - Assessment Ocular Range of Motion: Restricted on the right Alignment/Gaze Preference: Within Defined Limits Tracking/Visual Pursuits: Right eye does not track laterally Convergence: Impaired (comment) Diplopia Assessment: Only with right gaze Perception Perception: Within Functional Limits Praxis Praxis: Intact   Cognition Overall Cognitive Status: Within Functional Limits for tasks assessed Arousal/Alertness: Awake/alert Orientation Level: Oriented X4 Attention: Selective Focused Attention: Appears intact Sustained Attention: Appears intact Selective Attention: Appears intact Memory: Appears intact Problem Solving: Appears intact Safety/Judgment: Appears intact [employer] Scales of Cognitive Functioning: Purposeful/appropriate Sensation Sensation Light Touch: Impaired by gross assessment Stereognosis: Not tested Hot/Cold: Appears Intact Proprioception: Impaired by gross assessment Coordination Gross Motor Movements are Fluid and Coordinated: No Fine Motor Movements are Fluid and Coordinated: No Motor  Motor Motor: Tetraplegia  Mobility Bed Mobility Bed Mobility: Rolling Right;Rolling Left;Sit to Supine;Supine to Sit Rolling Right: 4: Min assist Rolling Right Details: Manual facilitation for weight shifting;Tactile cues for initiation;Verbal cues for technique;Verbal cues for sequencing Rolling Left: 4: Min assist Rolling Left Details: Manual facilitation for weight shifting;Tactile cues for initiation;Verbal cues for sequencing;Verbal cues for technique Supine to Sit: 4: Min assist;HOB flat Supine to Sit Details: Tactile cues for placement;Verbal cues for technique;Verbal cues for sequencing;Verbal cues for precautions/safety Sit to Supine: HOB flat;4: Min assist Sit to Supine - Details: Verbal cues for sequencing;Verbal cues for technique;Verbal cues for precautions/safety;Tactile cues for placement Transfers Transfers: Yes Sit to Stand: 3: Mod assist;With upper extremity assist Sit to Stand Details: Verbal cues for precautions/safety;Verbal cues for technique;Tactile cues for posture Stand Pivot Transfers: 3: Mod assist;With armrests(with standard walker) Lateral/Scoot Transfers: 4: Min guard;4: Min assist;With slide board;With armrests removed Lateral/Scoot Transfer  Details: Verbal cues for technique;Verbal cues for safe use of DME/AE;Verbal cues for precautions/safety;Tactile cues for weight shifting;Tactile cues for placement;Tactile cues for sequencing;Tactile cues for weight beaing;Tactile cues for posture;Verbal cues for sequencing Locomotion  Ambulation Ambulation: Yes Ambulation/Gait Assistance: 3: Mod assist Ambulation Distance (Feet): 10 Feet Assistive device: Parallel bars Ambulation/Gait Assistance Details: Verbal cues for precautions/safety Gait Gait: Yes Gait Pattern: Impaired Gait Pattern: Right genu recurvatum;Poor foot clearance - right;Poor foot clearance - left;Abducted -  left;Trunk flexed;Trendelenburg;Left flexed knee in stance;Decreased stride length;Step-to pattern Gait velocity: significantly decreased, non-functional speed Stairs / Additional Locomotion Stairs: No Architect: Psychologist, counselling) Wheelchair Assistance: 5: Investment banker, operational Details: Verbal cues for Information systems manager: Left upper extremity(power drive) Wheelchair Parts Management: Needs assistance Distance: >200'  Trunk/Postural Assessment  Cervical Assessment Cervical Assessment: Exceptions to WFL(cervical collar) Thoracic Assessment Thoracic Assessment: Exceptions to [facility] Lumbar Assessment Lumbar Assessment: Exceptions to [facility] Postural Control Postural Control: Deficits on evaluation(delayed righting strategies)  Balance Balance Balance Assessed: Yes Static Sitting Balance Static Sitting - Balance Support: Feet supported Static Sitting - Level of Assistance: 6: Modified independent (Device/Increase time) Dynamic Sitting Balance Dynamic Sitting - Balance Support: Feet supported;During functional activity Dynamic Sitting - Level of Assistance: 5: Stand by assistance Dynamic Sitting - Balance Activities: Lateral lean/weight shifting;Forward lean/weight shifting Static Standing  Balance Static Standing - Balance Support: Bilateral upper extremity supported Static Standing - Level of Assistance: 4: Min assist Dynamic Standing Balance Dynamic Standing - Balance Support: Bilateral upper extremity supported;During functional activity Dynamic Standing - Level of Assistance: 3: Mod assist Dynamic Standing - Balance Activities: Lateral lean/weight shifting;Forward lean/weight shifting;Reaching for objects Extremity Assessment  RUE Assessment RUE Assessment: Exceptions to [facility] RUE Strength Right Elbow Flexion: 3/5 Right Elbow Extension: 3/5 Right Wrist Flexion: 3/5 Right Wrist Extension: 3/5 Right Hand Gross Grasp: Impaired LUE Strength Left Elbow Flexion: 3+/5 Left Elbow Extension: 3+/5 Left Wrist Flexion: 3/5 Left Wrist Extension: 3/5 Left Hand Gross Grasp: Impaired RLE Assessment RLE Assessment: Exceptions to [facility] RLE PROM (degrees) Overall PROM Right Lower Extremity: Deficits RLE Overall PROM Comments: Ankle ROM limited d/t CAM boot, hip/knee ROM WFL RLE Strength RLE Overall Strength Comments: grossly 4/5 throughout LLE Assessment LLE Assessment: Exceptions to [facility] LLE PROM (degrees) Overall PROM Left Lower Extremity: Deficits LLE Overall PROM Comments: knee ROM limited to 90 degrees d/t pain, ankle ROM limited d/t CAM boot, hip flexion/extension/IR/ER WFL LLE Strength LLE Overall Strength: Deficits LLE Overall Strength Comments: 3-/5 hip flexion, 3-/5 hip extension, 4-/5 knee extension, 3/5 knee flexion, 0/5 ankle dorsiflexion  Skilled Therapeutic Intervention: Tx 1: Pt received seated in w/c, denies pain and agreeable to treatment. Stand pivot transfer w/c <>mat table with standard walker and min/modA. Returned pt to power chair as his loaner was delivered to home yesterday. Power w/c mobility with S throughout session, min cues for managing speed properly given situation/setting. Car transfer performed x2 trials with standard walker and stand pivot transfer  on first trial, slideboard on second trial. Each attempt required modA overall. Discussed with pt recommendation that he practice into his car with home health PT as their car setup will be different from simulated car. Returned to room S w/c management. Remained seated in w/c at end of session, all needs in reach.   Tx 2:pt received supine in bed, declining to participate until catheter performed as he is in pain; alerted to RN. Missed 15 min PT d/t nursing care. Navigated power w/c throughout unit with S. Gait in parallel bars forward/backward mod/maxA for LLE stance control and postural alignment d/t posterior lean. Returned to bed min guard slideboard transfer. Sit >supine minA for RLE management. Remained supine in bed at end of session, all needs in reach. Pt with no further questions/concerns regarding d/c home at this time.    See Function Navigator for Current Functional Status.  Benjiman Core Tygielski 07/18/2017, 7:36 AM

## 2017-07-18 NOTE — Progress Notes (Signed)
Occupational Therapy Session Note  Patient Details  Name: Demarea Lorey MRN: [id number] Date of Birth: 09/05/1966  Today's Date: 07/18/2017 OT Individual Time: 1030-1100 OT Individual Time Calculation (min): 30 min    Short Term Goals: Week 7:  OT Short Term Goal 1 (Week 7): STG=LTG secondary to ELOS  Skilled Therapeutic Interventions/Progress Updates:    Pt received sitting up in w/c, RN and NT completing nursing care. Pt requesting to return to bed to focus on stretching as Pt reporting increased pain in back. Pt completes sit<>stand and stand pivot at RW level with overall MinA. Lab tech arriving to draw blood, with Pt maintaining static sitting balance with supervision during task. Pt then completed additional sit<>stand to side step towards [facility] for better positioning, completing at [place] with MinA, increased time and verbal cues for LE placement during mobility. Pt requires MaxA to return to supine in bed, assists with scooting towards HOB. P/AAROM and stretching to bil UEs supine in bed. Pt left supine in bed, call bell and needs within reach.   Therapy Documentation Precautions:  Precautions Precautions: Fall, Cervical Required Braces or Orthoses: Cervical Brace Cervical Brace: Hard collar, At all times Other Brace/Splint: cam boot R LE; cam boot LLE, B resting hand splints for night use Restrictions Weight Bearing Restrictions: Yes RLE Weight Bearing: Weight bearing as tolerated(with cam boot when OOB) LLE Weight Bearing: Weight bearing as tolerated(with cam boot when OOB)   Pain: Pain Assessment Pain Assessment: Faces Faces Pain Scale: Hurts little more Pain Type: Chronic pain Pain Location: Back Pain Descriptors / Indicators: Discomfort;Sore Pain Onset: Gradual Pain Intervention(s): Repositioned  See Function Navigator for Current Functional Status.   Therapy/Group: Individual Therapy  Raymondo Band 07/18/2017, 4:20 PM

## 2017-07-18 NOTE — Progress Notes (Addendum)
Occupational Therapy Note  Patient Details  Name: [name] MRN: [id number] Date of Birth: 1965/09/26  Today's Date: 07/18/2017 OT Individual Time: 1100-1125 OT Individual Time Calculation (min): 25 min   Pt denies pain Individual Therapy  Pt asleep in bed upon arrival.  Pt requested to remain in bed.  Focus on BUE MMT, PROM, AAROM, and AROM at bed level.  Pt missed 35 mins skilled OT services 2/2 pt requested to rest in bed.     [name] [facility] 07/18/2017, 11:28 AM

## 2017-07-18 NOTE — Progress Notes (Signed)
Occupational Therapy Discharge Summary  Patient Details  Name: [name] MRN: [id number] Date of Birth: Jan 02, 1966   Patient has met 8 of 8 long term goals due to improved activity tolerance, improved balance, postural control and ability to compensate for deficits.  Pt made steady progress with BADLs and functional transfers during this admission.  Pt maintains sitting balance EOB for bathing and dressing tasks.  Pt requires min A for sit<>stand for pulling up pants during toileting tasks.  Pt continues to exhibit decrease BUE function and requires assistance with self feeding.  Pt's wife has been present during therapy and demonstrates appropriate level of assistance. Patient to discharge at [facility] Assist level.  Patient's care partner is independent to provide the necessary physical assistance at discharge.     Recommendation:  Patient will benefit from ongoing skilled OT services in home health setting to continue to advance functional skills in the area of BADL and Reduce care partner burden.  Equipment: Padded tub bench with cutout  Reasons for discharge: treatment goals met  Patient/family agrees with progress made and goals achieved: Yes  OT Discharge Precautions/Restrictions  Restrictions Weight Bearing Restrictions: Yes RLE Weight Bearing: Weight bearing as tolerated(with cam boot when OOB) LLE Weight Bearing: Weight bearing as tolerated(with cam boot when OOB) Vision Baseline Vision/History: Wears glasses Wears Glasses: Reading only Patient Visual Report: Diplopia;Blurring of vision Vision Assessment?: Yes Ocular Range of Motion: Restricted on the right Alignment/Gaze Preference: Within Defined Limits Tracking/Visual Pursuits: Right eye does not track laterally Convergence: Impaired (comment) Visual Fields: No apparent deficits Diplopia Assessment: Only with right gaze Perception  Perception: Within Functional Limits Praxis Praxis:  Intact Cognition Overall Cognitive Status: Within Functional Limits for tasks assessed Arousal/Alertness: Awake/alert Orientation Level: Oriented X4 Attention: Selective Focused Attention: Appears intact Sustained Attention: Appears intact Selective Attention: Appears intact Memory: Appears intact Problem Solving: Appears intact Safety/Judgment: Appears intact [employer] Scales of Cognitive Functioning: Purposeful/appropriate Sensation Sensation Light Touch: Impaired by gross assessment Stereognosis: Not tested Hot/Cold: Appears Intact Proprioception: Impaired by gross assessment Coordination Gross Motor Movements are Fluid and Coordinated: No Fine Motor Movements are Fluid and Coordinated: No Motor  Motor Motor: Tetraplegia Mobility  Bed Mobility Bed Mobility: Rolling Right;Rolling Left;Sit to Supine;Supine to Sit Rolling Right: 4: Min assist Rolling Right Details: Manual facilitation for weight shifting;Tactile cues for initiation;Verbal cues for technique;Verbal cues for sequencing Rolling Left: 4: Min assist Rolling Left Details: Manual facilitation for weight shifting;Tactile cues for initiation;Verbal cues for sequencing;Verbal cues for technique Supine to Sit: 4: Min assist;HOB flat Supine to Sit Details: Tactile cues for placement;Verbal cues for technique;Verbal cues for sequencing;Verbal cues for precautions/safety Sit to Supine: HOB flat;4: Min assist Sit to Supine - Details: Verbal cues for sequencing;Verbal cues for technique;Verbal cues for precautions/safety;Tactile cues for placement Transfers Sit to Stand: 3: Mod assist;With upper extremity assist Sit to Stand Details: Verbal cues for precautions/safety;Verbal cues for technique;Tactile cues for posture  Trunk/Postural Assessment  Cervical Assessment Cervical Assessment: Exceptions to WFL(cervical collar) Thoracic Assessment Thoracic Assessment: Exceptions to [facility] Lumbar Assessment Lumbar  Assessment: Exceptions to [facility] Postural Control Postural Control: Deficits on evaluation(delayed righting strategies)  Balance Balance Balance Assessed: Yes Static Sitting Balance Static Sitting - Balance Support: Feet supported Static Sitting - Level of Assistance: 6: Modified independent (Device/Increase time) Dynamic Sitting Balance Dynamic Sitting - Balance Support: Feet supported;During functional activity Dynamic Sitting - Level of Assistance: 5: Stand by assistance Dynamic Sitting - Balance Activities: Lateral lean/weight shifting;Forward lean/weight shifting Static  Standing Balance Static Standing - Balance Support: Bilateral upper extremity supported Static Standing - Level of Assistance: 4: Min assist Dynamic Standing Balance Dynamic Standing - Balance Support: Bilateral upper extremity supported;During functional activity Dynamic Standing - Level of Assistance: 3: Mod assist Dynamic Standing - Balance Activities: Lateral lean/weight shifting;Forward lean/weight shifting;Reaching for objects Extremity/Trunk Assessment RUE Assessment RUE Assessment: Exceptions to [facility] RUE Strength Right Elbow Flexion: 3/5 Right Elbow Extension: 3/5 Right Wrist Flexion: 3/5 Right Wrist Extension: 3/5 Right Hand Gross Grasp: Impaired LUE Strength Left Elbow Flexion: 3+/5 Left Elbow Extension: 3+/5 Left Wrist Flexion: 3/5 Left Wrist Extension: 3/5 Left Hand Gross Grasp: Impaired   See Function Navigator for Current Functional Status.  Lou Cal, OT Pager [phone] 07/18/2017  Leroy Libman 07/18/2017, 12:01 PM

## 2017-07-19 DIAGNOSIS — B965 Pseudomonas (aeruginosa) (mallei) (pseudomallei) as the cause of diseases classified elsewhere: Secondary | ICD-10-CM

## 2017-07-19 DIAGNOSIS — N390 Urinary tract infection, site not specified: Secondary | ICD-10-CM

## 2017-07-19 LAB — URINE CULTURE

## 2017-07-19 MED ORDER — FLAVOXATE HCL 100 MG PO TABS
100.0000 mg | ORAL_TABLET | Freq: Three times a day (TID) | ORAL | Status: DC | PRN
  Filled 2017-07-19: qty 1

## 2017-07-19 MED ORDER — POLYETHYLENE GLYCOL 3350 17 G PO PACK: 17.0000 g | PACK | Freq: Every day | ORAL | 0 refills | Status: DC | PRN

## 2017-07-19 MED ORDER — BISACODYL 10 MG RE SUPP
10.0000 mg | Freq: Every day | RECTAL | 0 refills | Status: DC
Start: 2017-07-19 — End: 2017-07-19

## 2017-07-19 MED ORDER — METHOCARBAMOL 500 MG PO TABS: 500.0000 mg | ORAL_TABLET | Freq: Four times a day (QID) | ORAL | 0 refills | Status: DC

## 2017-07-19 MED ORDER — TRAMADOL HCL 50 MG PO TABS: 50.0000 mg | ORAL_TABLET | Freq: Four times a day (QID) | ORAL | 0 refills | Status: DC | PRN

## 2017-07-19 MED ORDER — POTASSIUM CHLORIDE CRYS ER 10 MEQ PO TBCR: 10.0000 meq | EXTENDED_RELEASE_TABLET | Freq: Every day | ORAL | 0 refills | Status: DC

## 2017-07-19 MED ORDER — CIPROFLOXACIN HCL 500 MG PO TABS: 500.0000 mg | ORAL_TABLET | Freq: Two times a day (BID) | ORAL | 0 refills | Status: DC

## 2017-07-19 MED ORDER — FENTANYL 12 MCG/HR TD PT72: 12.5000 ug | MEDICATED_PATCH | TRANSDERMAL | 0 refills | Status: DC

## 2017-07-19 MED ORDER — BISACODYL 10 MG RE SUPP: 10.0000 mg | Freq: Every day | RECTAL | 0 refills | Status: DC

## 2017-07-19 MED ORDER — GABAPENTIN 400 MG PO CAPS: 400.0000 mg | ORAL_CAPSULE | Freq: Three times a day (TID) | ORAL | 0 refills | Status: DC

## 2017-07-19 MED ORDER — LIDOCAINE 5 % EX PTCH: MEDICATED_PATCH | CUTANEOUS | 0 refills | Status: DC

## 2017-07-19 MED ORDER — VENLAFAXINE HCL 75 MG PO TABS: 75.0000 mg | ORAL_TABLET | Freq: Two times a day (BID) | ORAL | 0 refills | Status: DC

## 2017-07-19 MED ORDER — CEPHALEXIN 500 MG PO CAPS: 500.0000 mg | ORAL_CAPSULE | Freq: Two times a day (BID) | ORAL | 0 refills | Status: DC

## 2017-07-19 MED ORDER — CLONAZEPAM 0.5 MG PO TBDP: 0.5000 mg | ORAL_TABLET | Freq: Every day | ORAL | 0 refills | Status: DC

## 2017-07-19 MED ORDER — DICLOFENAC SODIUM 1 % TD GEL: 2.0000 g | Freq: Four times a day (QID) | TRANSDERMAL | 0 refills | Status: AC

## 2017-07-19 MED ORDER — CIPROFLOXACIN HCL 500 MG PO TABS: 500.0000 mg | ORAL_TABLET | Freq: Two times a day (BID) | ORAL | Status: DC

## 2017-07-19 MED ORDER — CIPROFLOXACIN HCL 500 MG PO TABS
500.0000 mg | ORAL_TABLET | Freq: Once | ORAL | Status: AC
  Administered 2017-07-19: 500 mg via ORAL
  Filled 2017-07-19: qty 1

## 2017-07-19 MED ORDER — PHENAZOPYRIDINE HCL 200 MG PO TABS
200.0000 mg | ORAL_TABLET | Freq: Three times a day (TID) | ORAL | Status: DC
  Administered 2017-07-19: 200 mg via ORAL
  Filled 2017-07-19 (×2): qty 1

## 2017-07-19 MED ORDER — CEPHALEXIN 250 MG PO CAPS
500.0000 mg | ORAL_CAPSULE | Freq: Two times a day (BID) | ORAL | Status: DC
  Administered 2017-07-19: 500 mg via ORAL
  Filled 2017-07-19 (×2): qty 2

## 2017-07-19 MED ORDER — QUETIAPINE FUMARATE 50 MG PO TABS: 50.0000 mg | ORAL_TABLET | Freq: Two times a day (BID) | ORAL | 0 refills | Status: DC

## 2017-07-19 MED ORDER — FLAVOXATE HCL 100 MG PO TABS: 100.0000 mg | ORAL_TABLET | Freq: Three times a day (TID) | ORAL | 0 refills | Status: DC | PRN

## 2017-07-19 MED ORDER — NAPHAZOLINE-GLYCERIN 0.012-0.2 % OP SOLN: 2.0000 [drp] | Freq: Three times a day (TID) | OPHTHALMIC | 0 refills | Status: DC

## 2017-07-19 NOTE — Progress Notes (Signed)
Social Work Patient ID: [name], male   DOB: 30-Apr-1966, 51 y.o.   MRN: [id number]   CSW has left pt's wife 2 messages and 1 text to try to coordinate pt's d/c this afternoon.  She has not returned CSW's messages.  Pt stated that he will not leave until after 4pm because that is when she is done working.  DME also needs to be delivered to the room or to the hospital, but CSW does not know pt's wife's preference.  CSW also to arrange ambulance once she is home.  CSW will continue to follow and assist as needed.

## 2017-07-19 NOTE — Progress Notes (Signed)
[place] [facility]YS[facility]ICINE & REHABILITATION     PROGRESS NOTE    Subjective/Complaints: Pt seen laying in bed this Am.  He states he slept fairly and "could be better".  He notes discomfort with brace.  He also notes his skin is dry. He is happy to be going home.   ROS: Denies nausea, vomiting, diarrhea, shortness of breath or chest pain   Objective: Vital Signs: Blood pressure 106/74, pulse 78, temperature 98.4 F (36.9 C), temperature source Oral, resp. rate 18, height 5\' 6"  (1.676 m), weight 87.6 kg (193 lb 1.6 oz), SpO2 100 %. No results found. No results for input(s): WBC, HGB, HCT, PLT in the last 72 hours. Recent Labs    07/18/17 1033  NA 135  K 4.2  CL 100*  GLUCOSE 102*  BUN 6  CREATININE 0.63  CALCIUM 9.8   CBG (last 3)  No results for input(s): GLUCAP in the last 72 hours.  Wt Readings from Last 3 Encounters:  07/19/17 87.6 kg (193 lb 1.6 oz)  05/21/17 99 kg (218 lb 3.2 oz)  01/11/17 91.6 kg (202 lb)    Physical Exam:  Constitutional: He appears well-developed. NAD.  HENT: Normocephalic. Atraumatic. Eyes: lateral eye movement remains restricted.  slceral hemorrhage improving Neck:  +Aspen collar  Cardiovascular:  RRR. No JVD  Respiratory:  CTA Bilaterally. Normal effort   GI: BS +, non-distended  Musculoskeletal: No edema. No tenderess Neurological:  Alert Motor: LUE and LLE 3/5.   B/l LE 2+/5 HF. RLE 3/5 ADF/PF, LLE 1/5 ADF/PF Skin: Skin is warmand dry. Healing abrasions Psychiatric: pleasant and cooperative  Assessment/Plan: 1. Functional deficits, tetraplegia secondary to TBI/cervical SCI which require 3+ hours per day of interdisciplinary therapy in a comprehensive inpatient rehab setting. Physiatrist is providing close team supervision and 24 hour management of active medical problems listed below. Physiatrist and rehab team continue to assess barriers to discharge/monitor patient progress toward functional and medical  goals.  Function:  Bathing Bathing position Bathing activity did not occur: N/A Position: Bed  Bathing parts Body parts bathed by patient: Chest, Abdomen, Front perineal area, Right upper leg, Left upper leg Body parts bathed by helper: Right arm, Left arm, Buttocks, Left lower leg, Right lower leg  Bathing assist Assist Level: 2 helpers      Upper Body Dressing/Undressing Upper body dressing   What is the patient wearing?: Pull over shirt/dress     Pull over shirt/dress - Perfomed by patient: Thread/unthread right sleeve, Thread/unthread left sleeve, Pull shirt over trunk Pull over shirt/dress - Perfomed by helper: Put head through opening   Button up shirt - Perfomed by helper: Thread/unthread right sleeve, Thread/unthread left sleeve, Pull shirt around back, Button/unbutton shirt    Upper body assist Assist Level: Touching or steadying assistance(Pt > 75%)      Lower Body Dressing/Undressing Lower body dressing   What is the patient wearing?: Pants, AFO     Pants- Performed by patient: Thread/unthread right pants leg, Thread/unthread left pants leg Pants- Performed by helper: Pull pants up/down                      Lower body assist Assist for lower body dressing: 2 Helpers      Toileting Toileting Toileting activity did not occur: No continent bowel/bladder event Toileting steps completed by patient: Adjust clothing prior to toileting Toileting steps completed by helper: Adjust clothing prior to toileting, Performs perineal hygiene, Adjust clothing after toileting  Toileting assist Assist level: (totalA)   Transfers Chair/bed transfer Chair/bed transfer activity did not occur: N/A Chair/bed transfer method: Stand pivot Chair/bed transfer assist level: Touching or steadying assistance (Pt > 75%) Chair/bed transfer assistive device: Armrests, Walker Mechanical lift: Maximove   Locomotion Ambulation Ambulation activity did not occur: Safety/medical  concerns   Max distance: 10 Assist level: Maximal assist (Pt 25 - 49%)   Wheelchair Wheelchair activity did not occur: Safety/medical concerns Type: Motorized Max wheelchair distance: 293ft  Assist Level: Supervision or verbal cues  Cognition Comprehension Comprehension assist level: Understands complex 90% of the time/cues 10% of the time  Expression Expression assist level: Expresses complex 90% of the time/cues < 10% of the time  Social Interaction Social Interaction assist level: Interacts appropriately 90% of the time - Needs monitoring or encouragement for participation or interaction.  Problem Solving Problem solving assist level: Solves basic problems with no assist  Memory Memory assist level: Recognizes or recalls 90% of the time/requires cueing < 10% of the time   Medical Problem List and Plan: 1. Functional, cognitive and mobility deficitssecondary to Moderate TBI, C4 spinal cord injury  D/c today  Pt to follow up for transitional care management in 1-2 weeks post-discharge.   -power w/c order placed    -collar will need to stay on a minimum of 12 weeks given location and severity of injury. Follow up xrays  show good alignment  -FTF w/c documentation completed  2. DVT Prophylaxis/Anticoagulation: Pharmaceutical: Lovenox 3. Chronic neck pain/Pain Management: On Gabapentin bid   -changed hydrocodone to oxycodone for breakthrough pain  - fentanyl patch  61mcg/hr --continue at home 4. Mood: LCSW to follow for evaluation and support when appropriate.  5. Neuropsych: This patient is notcapable of making decisions on hisown behalf.  -continue deficits in memory/insight 6. Skin/Wound Care: Air mattress overlay. Maintain adequate nutritional and hydration status.  7. Fluids/Electrolytes/Nutrition: Monitor I/O.    -diet advanced to regular/thins.    BMP within acceptable range on 11/8 9. Left periorbital cellulitis/ CN III/VI palsy:  -continue local care 10.Left femur  fracture s/p IM nail: now WBAT bilateral LE's  -peristent left foot drop 11. Left open tibial fracture s/p I and D with ORIF: NWB--CAM boot.  11. Left mandibular fracture s/p CR /tongue laceration:   - hardware completely removed  -reg diet 12. Chemosis left eye: Continue Lacrilube every 3 hours till resolved per opthalmology.  -  optho recommends ongoing consvt mgt   - drops/lubricant left eye  -scleral hemorrhage OD, consvt mgt, resolving 13. Facial cellulitis: keflex was completed  15. Thrombocytosis: Resolved.    16. ABLA:   Hemoglobin 10.9 10/21 17. Abnormal LFTs: Resolved 18. H/o of MDD: Was on Trintellix 5 mg and Risperidone 5 mg bid at home--continue to hold for now.   -behavior reasonable at present 19. Urine retention/neurogenic bladder: foley out. Beginning to have some sense of emptying  -continue  I/O caths   -Discontinued Urecholine as no signs of voluntary or involuntary emptying.  He will need to go home with a Foley catheter.  We will set up urologyl follow-up as an outpatient 20. Acute lower UTI  Pseudomonas  Keflex ordered 11/9         LOS (Days) 8 A FACE TO FACE EVALUATION WAS PERFORMED  Ankit Lorie Phenix, MD 07/19/2017 10:23 AM

## 2017-07-19 NOTE — Discharge Instructions (Signed)
Inpatient Rehab Discharge Instructions  [name] Discharge date and time:  07/19/17  Activities/Precautions/ Functional Status: Activity: No lifting, driving, or strenuous exercise for till cleared by MD. Boots have to be on when out of bed and for weight bearing.  Diet: Regular Wound Care: keep wound clean and dry   Functional status:  ___ No restrictions     ___ Walk up steps independently _X__ 24/7 supervision/assistance   ___ Walk up steps with assistance ___ Intermittent supervision/assistance  ___ Bathe/dress independently ___ Walk with walker     ___ Bathe/dress with assistance ___ Walk Independently    ___ Shower independently ___ Walk with assistance    ___ Shower with assistance _X__ No alcohol     ___ Return to work/school ________  COMMUNITY REFERRALS UPON DISCHARGE:   Home Health:   PT     OT     RN  Agency:  [facility] Phone:  [phone] Medical Equipment/Items Ordered:  Padded tub transfer bench; 30" slide board; power wheelchair  Agency/Supplier:  [facility]        Phone:  [phone] Other:  [name] at the [facility] really needs some more information and definitely needs you to come in to sign a consent of release.  [name] case cannot move froward until this information is received and the case will be closed by November 29th if it is not received.  Please call [name] at [phone].  Special Instructions: 1. Perform foley care twice a day. 2. Weight bearing as tolerated on bilateral feet with CAM boots. 3. Pressure relief measures every 20 minutes--side lie when in bed. 4. Collar needs to be on at all times till cleared by Dr. Kathyrn Sheriff.    My questions have been answered and I understand these instructions. I will adhere to these goals and the provided educational materials after my discharge from the hospital.  Patient/Caregiver Signature _______________________________ Date  __________  Clinician Signature _______________________________________ Date __________  Please bring this form and your medication list with you to all your follow-up doctor's appointments.

## 2017-07-19 NOTE — Progress Notes (Signed)
Social Work  Discharge Note  The overall goal for the admission was met for:   Discharge location: [place] WILL ASSIST WITH 24 HR CARE  Length of Stay: No-59 DAYS  Discharge activity level: No-MIN WHEELCHAIR LEVEL  Home/community participation: Yes  Services provided included: MD, RD, PT, OT, SLP, RN, CM, TR, Pharmacy, Neuropsych and SW  Financial Services: Other: PENDING MEDICAID  Follow-up services arranged: Home Health: [facility]-RN,PT,OT,SW,SP, DME: [facility]-POWER CHAIR, PADDED TUB BENCH WITH CUT OUT AND 30 TRANSFER BOARD and Patient/Family has no preference for HH/DME agencies  Comments (or additional information):WIFE HAS BEEN IN FOR EDUCATION ON MULTIPLE OCCASSIONS AND FEELS [place]. WILL CONTINUE TO PURSUE DISABILITY AND MEDICAID BOTH ARE PENDING  Patient/Family verbalized understanding of follow-up arrangements: Yes  Individual responsible for coordination of the follow-up plan: WIFE  Confirmed correct DME delivered: Elease Hashimoto 07/19/2017    Elease Hashimoto
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
## 2017-07-19 NOTE — Progress Notes (Signed)
Wife is present in room, pt has new 7 F 10 ML foley catheter patent and draining, ready to discharge when ambulance arrives.

## 2017-07-25 ENCOUNTER — Telehealth: Payer: Self-pay

## 2017-07-25 ENCOUNTER — Telehealth: Payer: Self-pay | Admitting: *Deleted

## 2017-07-25 NOTE — Telephone Encounter (Signed)
Anderson Malta, RN, [facility]  Left a message inquiring #1. if patient is supposed to be on lisinopril and hydrochlorothiazide. He does not have these medications at his home. #2 The order for foley catheter indicates a change schedule every 2 weeks.  Patient is currently taking antibiotics for a UTI.  RN is concerned that frequency for change is to long and puts the patient at risk for more UTI's. Please advise

## 2017-07-25 NOTE — Telephone Encounter (Signed)
[name] OT [facility] called today requesting verbal orders for this patient for 2xwk X 3wks, called her back and approved verbal orders.

## 2017-07-26 NOTE — Telephone Encounter (Signed)
1. Is the patient hypertensive?  He was not discharged on either of those medications so unless his blood pressure is running high he does not need to resume those  2.  I think 2 weeks for the Foley is appropriate.  Weekly changes actually can increase the risk of infection as well.  They need to practice good Foley care as does the family.

## 2017-07-26 NOTE — Telephone Encounter (Signed)
Informed [facility] nurse, encouraged call back with any questions

## 2017-07-28 ENCOUNTER — Emergency Department (HOSPITAL_COMMUNITY): Payer: BC Managed Care – PPO

## 2017-07-28 ENCOUNTER — Emergency Department (HOSPITAL_COMMUNITY)
Admission: EM | Admit: 2017-07-28 | Discharge: 2017-07-28 | Disposition: A | Discharge status: Home / Self Care | Payer: BC Managed Care – PPO | Attending: Emergency Medicine | Admitting: Emergency Medicine

## 2017-07-28 DIAGNOSIS — Y939 Activity, unspecified: Secondary | ICD-10-CM | POA: Diagnosis not present

## 2017-07-28 DIAGNOSIS — Y999 Unspecified external cause status: Secondary | ICD-10-CM | POA: Insufficient documentation

## 2017-07-28 DIAGNOSIS — F17200 Nicotine dependence, unspecified, uncomplicated: Secondary | ICD-10-CM | POA: Diagnosis not present

## 2017-07-28 DIAGNOSIS — Z79899 Other long term (current) drug therapy: Secondary | ICD-10-CM | POA: Insufficient documentation

## 2017-07-28 DIAGNOSIS — W19XXXA Unspecified fall, initial encounter: Secondary | ICD-10-CM | POA: Diagnosis not present

## 2017-07-28 DIAGNOSIS — Y929 Unspecified place or not applicable: Secondary | ICD-10-CM | POA: Insufficient documentation

## 2017-07-28 DIAGNOSIS — M545 Low back pain, unspecified: Secondary | ICD-10-CM

## 2017-07-28 DIAGNOSIS — S161XXA Strain of muscle, fascia and tendon at neck level, initial encounter: Secondary | ICD-10-CM | POA: Diagnosis not present

## 2017-07-28 DIAGNOSIS — M62838 Other muscle spasm: Secondary | ICD-10-CM | POA: Diagnosis not present

## 2017-07-28 DIAGNOSIS — S199XXA Unspecified injury of neck, initial encounter: Secondary | ICD-10-CM | POA: Diagnosis present

## 2017-07-28 LAB — BASIC METABOLIC PANEL
ANION GAP: 7 (ref 5–15)
BUN: 7 mg/dL (ref 6–20)
CALCIUM: 9.6 mg/dL (ref 8.9–10.3)
CO2: 25 mmol/L (ref 22–32)
CREATININE: 0.56 mg/dL — AB (ref 0.61–1.24)
Chloride: 105 mmol/L (ref 101–111)
Glucose, Bld: 109 mg/dL — ABNORMAL HIGH (ref 65–99)
Potassium: 4.1 mmol/L (ref 3.5–5.1)
Sodium: 137 mmol/L (ref 135–145)

## 2017-07-28 LAB — CBC WITH DIFFERENTIAL/PLATELET
BASOS ABS: 0 10*3/uL (ref 0.0–0.1)
BASOS PCT: 1 %
EOS ABS: 0.2 10*3/uL (ref 0.0–0.7)
Eosinophils Relative: 4 %
HCT: 37.3 % — ABNORMAL LOW (ref 39.0–52.0)
HEMOGLOBIN: 12.1 g/dL — AB (ref 13.0–17.0)
Lymphocytes Relative: 42 %
Lymphs Abs: 2.6 10*3/uL (ref 0.7–4.0)
MCH: 26.2 pg (ref 26.0–34.0)
MCHC: 32.4 g/dL (ref 30.0–36.0)
MCV: 80.9 fL (ref 78.0–100.0)
MONO ABS: 0.6 10*3/uL (ref 0.1–1.0)
MONOS PCT: 9 %
NEUTROS PCT: 44 %
Neutro Abs: 2.8 10*3/uL (ref 1.7–7.7)
Platelets: 273 10*3/uL (ref 150–400)
RBC: 4.61 MIL/uL (ref 4.22–5.81)
RDW: 15.7 % — AB (ref 11.5–15.5)
WBC: 6.3 10*3/uL (ref 4.0–10.5)

## 2017-07-28 MED ORDER — FENTANYL CITRATE (PF) 100 MCG/2ML IJ SOLN
50.0000 ug | Freq: Once | INTRAMUSCULAR | Status: AC
  Administered 2017-07-28: 50 ug via INTRAVENOUS
  Filled 2017-07-28: qty 2

## 2017-07-28 NOTE — ED Notes (Signed)
Called lab to check on process. Stated "she is opening some tubes and it could be in there....give it a few more minutes."

## 2017-07-28 NOTE — ED Triage Notes (Signed)
Pt BIB [facility] for worsening neck/back pain/spasms. Pt has a c2 fx that was surgically repaired here discharged home and he fell on Tuesday. Was assisted back up to his wheel chair without problems. Pain/ spasms worse today. Pt has an indwelling folly. Can pivot/weight bear with braces at home.
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
## 2017-07-28 NOTE — ED Notes (Signed)
[name] [phone]

## 2017-07-29 ENCOUNTER — Telehealth: Payer: Self-pay | Admitting: *Deleted

## 2017-07-29 NOTE — Telephone Encounter (Signed)
Larey Days, PT, [facility] left a message stating that physical therapy eval was completed. She is requesting verbal orders 1week2 followed by 2week2 to address bilateral lower extremity strengthening, transfers, gait, and bed mobility.  Verbal orders given per office protocol

## 2017-07-30 ENCOUNTER — Inpatient Hospital Stay: Payer: Self-pay | Admitting: Physical Medicine & Rehabilitation

## 2017-07-30 ENCOUNTER — Encounter: Payer: Self-pay | Admitting: Physical Medicine & Rehabilitation

## 2017-07-31 ENCOUNTER — Encounter: Payer: Self-pay | Admitting: Physical Medicine & Rehabilitation

## 2017-07-31 ENCOUNTER — Encounter
Payer: BC Managed Care – PPO | Attending: Physical Medicine & Rehabilitation | Admitting: Physical Medicine & Rehabilitation

## 2017-07-31 VITALS — BP 109/73 | HR 92

## 2017-07-31 DIAGNOSIS — F329 Major depressive disorder, single episode, unspecified: Secondary | ICD-10-CM | POA: Diagnosis not present

## 2017-07-31 DIAGNOSIS — W19XXXA Unspecified fall, initial encounter: Secondary | ICD-10-CM | POA: Insufficient documentation

## 2017-07-31 DIAGNOSIS — E559 Vitamin D deficiency, unspecified: Secondary | ICD-10-CM | POA: Diagnosis not present

## 2017-07-31 DIAGNOSIS — G894 Chronic pain syndrome: Secondary | ICD-10-CM | POA: Insufficient documentation

## 2017-07-31 DIAGNOSIS — N319 Neuromuscular dysfunction of bladder, unspecified: Secondary | ICD-10-CM | POA: Insufficient documentation

## 2017-07-31 DIAGNOSIS — S062X9A Diffuse traumatic brain injury with loss of consciousness of unspecified duration, initial encounter: Secondary | ICD-10-CM | POA: Diagnosis not present

## 2017-07-31 DIAGNOSIS — M542 Cervicalgia: Secondary | ICD-10-CM | POA: Diagnosis not present

## 2017-07-31 DIAGNOSIS — Z720 Tobacco use: Secondary | ICD-10-CM | POA: Insufficient documentation

## 2017-07-31 DIAGNOSIS — G8250 Quadriplegia, unspecified: Secondary | ICD-10-CM | POA: Insufficient documentation

## 2017-07-31 DIAGNOSIS — M21372 Foot drop, left foot: Secondary | ICD-10-CM | POA: Diagnosis not present

## 2017-07-31 DIAGNOSIS — S14104A Unspecified injury at C4 level of cervical spinal cord, initial encounter: Secondary | ICD-10-CM | POA: Insufficient documentation

## 2017-07-31 DIAGNOSIS — K592 Neurogenic bowel, not elsewhere classified: Secondary | ICD-10-CM | POA: Diagnosis not present

## 2017-07-31 MED ORDER — GABAPENTIN 600 MG PO TABS: 600.0000 mg | ORAL_TABLET | Freq: Three times a day (TID) | ORAL | 3 refills | Status: DC

## 2017-07-31 MED ORDER — OXYCODONE HCL 10 MG PO TABS: 10.0000 mg | ORAL_TABLET | Freq: Three times a day (TID) | ORAL | 0 refills | Status: DC | PRN

## 2017-07-31 NOTE — Patient Instructions (Signed)
PLEASE FEEL FREE TO CALL OUR OFFICE WITH ANY PROBLEMS OR QUESTIONS ([phone])      

## 2017-07-31 NOTE — Progress Notes (Signed)
Subjective:    Patient ID: [name], male    DOB: [birth year], 51 y.o.   MRN: [id number]  HPI  This is a transitional care for [name].  He was discharged from inpatient rehab on November 9 to home.  There is an ED visit from earlier this month where he apparently fell while trying to transfer.  He had some increased back pain which is since dissipated to the extent.  They are unable to acquire the fentanyl due to cost.  He is only receiving tramadol for pain.  He is on gabapentin 400 mg 3 times daily for neuropathic pain.  He is also receiving Robaxin 500 mg 4 times daily.  Most of his pain is in his neck as well as his left leg and foot.  He also has mid back pain.  It hurts to transfer.  He is usually using his right leg for stand pivot transfers with therapy.  His Foley catheter remains in.  He is moving his bowels every other day and taking Colace.  He states that he is somewhat continent of his bowels.  Sleep can be an issue due to his pain levels.  His vision seems to be improving but he still has problems with acuity.  He has not seen ophthalmology in follow-up as of yet.  He also sees neurosurgery in follow-up next month and remains in his cervical collar.  He has had a few abrasions and wounds that are being followed at home.  Wounds in general appear to be stable.  He has a large area of eschar still on the right heel  Pain Inventory Average Pain 9 Pain Right Now 9 My pain is burning  In the last 24 hours, has pain interfered with the following? General activity 10 Relation with others 10 Enjoyment of life 10 What TIME of day is your pain at its worst? night Sleep (in general) Poor  Pain is worse with: some activites Pain improves with: rest Relief from Meds: .  Mobility walk with assistance use a walker ability to climb steps?  no do you drive?  no use a wheelchair needs help with transfers  Function not employed: date last employed . I need  assistance with the following:  feeding, dressing, bathing, toileting, meal prep, household duties and shopping  Neuro/Psych bladder control problems weakness numbness trouble walking  Prior Studies Any changes since last visit?  no  Physicians involved in your care Any changes since last visit?  no   Family History  Problem Relation Age of Onset  . Hypothyroidism Mother   . Hypertension Father    Social History   Socioeconomic History  . Marital status: Married    Spouse name: [name]  . Number of children: 4  . Years of education: 85  . Highest education level: None  Social Needs  . Financial resource strain: None  . Food insecurity - worry: None  . Food insecurity - inability: None  . Transportation needs - medical: None  . Transportation needs - non-medical: None  Occupational History    Comment: unemployed  Tobacco Use  . Smoking status: Smoker, Current Status Unknown  . Smokeless tobacco: Never Used  Substance and Sexual Activity  . Alcohol use: Yes    Comment: 1 pint daily  . Drug use: Yes    Types: Cocaine  . Sexual activity: Yes  Other Topics Concern  . None  Social History Narrative   ** Merged History Encounter **  Lives at home wife, children Caffeine use- coffee 2-3 cups    Past Surgical History:  Procedure Laterality Date  . BACK SURGERY     yrs ago  . CLOSED REDUCTION MANDIBLE N/A 05/07/2017   Procedure: CLOSED REDUCTION MANDIBULAR;  Surgeon: Melida Quitter, MD;  Location: [facility];  Service: ENT;  Laterality: N/A;  . ESOPHAGOGASTRODUODENOSCOPY N/A 05/07/2017   Procedure: ESOPHAGOGASTRODUODENOSCOPY (EGD);  Surgeon: Georganna Skeans, MD;  Location: [place];  Service: General;  Laterality: N/A;  . EXTERNAL FIXATION LEG Left 05/03/2017   Procedure: EXTERNAL FIXATION LEFT LOWER LEG;  Surgeon: Meredith Pel, MD;  Location: [place];  Service: Orthopedics;  Laterality: Left;  . EXTERNAL FIXATION REMOVAL Left 05/16/2017   Procedure: REMOVAL EXTERNAL  FIXATION LEG;  Surgeon: Shona Needles, MD;  Location: [place];  Service: Orthopedics;  Laterality: Left;  . FEMUR IM NAIL Left 05/03/2017   Procedure: LEFT INTRAMEDULLARY (IM) NAIL FEMORAL;  Surgeon: Meredith Pel, MD;  Location: [place];  Service: Orthopedics;  Laterality: Left;  . I&D EXTREMITY Left 05/03/2017   Procedure: IRRIGATION AND DEBRIDEMENT LEFT LOWER EXTREMITY;  Surgeon: Meredith Pel, MD;  Location: [place];  Service: Orthopedics;  Laterality: Left;  . I&D EXTREMITY Left 05/07/2017   Procedure: IRRIGATION AND DEBRIDEMENT, LEFT TIBIA  EX-FIX ADJUSTMENT;  Surgeon: Shona Needles, MD;  Location: [place];  Service: Orthopedics;  Laterality: Left;  . INCISION AND DRAINAGE OF WOUND Right 05/03/2017   Procedure: IRRIGATION AND DEBRIDEMENT WOUND;  Surgeon: Meredith Pel, MD;  Location: [place];  Service: Orthopedics;  Laterality: Right;  . MANDIBULAR HARDWARE REMOVAL N/A 06/26/2017   Procedure: MANDIBULAR HARDWARE REMOVAL;  Surgeon: Melida Quitter, MD;  Location: [place];  Service: ENT;  Laterality: N/A;  . ORIF TIBIA FRACTURE Left 05/16/2017   Procedure: OPEN REDUCTION INTERNAL FIXATION (ORIF) LEFT DISTAL TIBIA FRACTURE, REMOVAL OF EX-FIX;  Surgeon: Shona Needles, MD;  Location: [place];  Service: Orthopedics;  Laterality: Left;  . PEG PLACEMENT N/A 05/07/2017   Procedure: PERCUTANEOUS ENDOSCOPIC GASTROSTOMY (PEG) PLACEMENT;  Surgeon: Georganna Skeans, MD;  Location: [place];  Service: General;  Laterality: N/A;  . POSTERIOR CERVICAL FUSION/FORAMINOTOMY N/A 05/10/2017   Procedure: POSTERIOR CERVICAL FUSION OCCIPUT- CERVICAL FOUR;  Surgeon: Consuella Lose, MD;  Location: [place];  Service: Neurosurgery;  Laterality: N/A;  . TRACHEOSTOMY TUBE PLACEMENT N/A 05/07/2017   Procedure: TRACHEOSTOMY;  Surgeon: Melida Quitter, MD;  Location: [place];  Service: ENT;  Laterality: N/A;   Past Medical History:  Diagnosis Date  . Chronic back pain   . History of suicidal ideation 01/2017   with overdose/  [facility] admission  . Lumbago with sciatica    left side  . Vitamin D deficiency    BP 109/73   Pulse 92   SpO2 95%   Opioid Risk Score:   Fall Risk Score:  `1  Depression screen PHQ 2/9  No flowsheet data found.   Review of Systems  Constitutional: Negative.   HENT: Negative.   Eyes: Negative.   Respiratory: Negative.   Cardiovascular: Negative.   Gastrointestinal: Negative.   Endocrine: Negative.   Genitourinary: Positive for difficulty urinating.  Musculoskeletal: Positive for joint swelling.  Skin: Positive for rash.  Allergic/Immunologic: Negative.   Neurological: Negative.   Hematological: Negative.   Psychiatric/Behavioral: Negative.   All other systems reviewed and are negative.      Objective:   Physical Exam   General: Alert and oriented x 3, No apparent distress HEENT: Head is  normocephalic, atraumatic, PERRLA, EOMI, sclera less injected, oral mucosa pink and moist, dentition intact, ext ear canals clear,  Neck: Supple without JVD or lymphadenopathy.  Wearing cervical collar Heart: Reg rate and rhythm. No murmurs rubs or gallops Chest: CTA bilaterally without wheezes, rales, or rhonchi; no distress Abdomen: Soft, non-tender, non-distended, bowel sounds positive. Extremities: No clubbing, cyanosis, or edema. Pulses are 2+ Skin: Healing wounds along the right leg and shin.  He has a 2 inch in diameter area of eschar over the right heel which appears clean and dry.  There are a few other scattered abrasions also noted.  Lurline Idol site is closed. Neuro: Pt is cognitively appropriate with normal insight, memory, and awareness.  Eye-movement is improving.  He still lacks some abduction of the right eye as well as inferior deviation of the left eye during tracking.  Visual acuity is improving.. Sensory exam is normal. Reflexes are 2+ in all 4's. Fine motor coordination is intact. No tremors.  Upper extremity motor function grossly 2+ to 3+ out of 5 proximal to distal.   Right hip flexion is 3 out of 5 knee extension 3+ ankle dorsiflexion and plantar flexion 3+ out of 5.  Left lower extremity limited by pain proximally.  He had no ankle dorsiflexion or plantarflexion noted today.  Decreased sensation to light touch and pain over the anterior and posterior left leg..  Musculoskeletal: He continues to display pain with basic range of motion of the left ankle and knee.  Neck range of motion limited by collar but has some movement with repositioning of the head Psych: He is generally pleasant and cooperative but he does remain anxious.        Assessment & Plan:  1.  Functional, cognitive and mobility deficits secondary to Moderate TBI, C4 spinal cord injury            -currently receiving HH therapy.  We will continue with this for now.. Eventually he will be a candidate for outpatient therapies 2  Chronic neck pain/Pain Management/ recent fall at home - titrate gabapentin to 600mg  TID           -dc'd tramadol    -trial of oxycodone again for pain 10mg  q8 prn #90 4.  Skin: Reviewed with wife.  Pressure relief as possible.  I would stay conservative with the right heel eschar which seems to be slowly improving.  May need physical debridement at some point but not at this time.Marland Kitchen  5  Left periorbital cellulitis/ CN III/VI palsy:           -continue local care to eyes.  Both eyes showing improvement in movements.    -Recommend outpatient ophthalmology follow-up. Marland Kitchen6 Left femur fracture s/p IM nail: now WBAT bilateral LE's           -peristent left foot drop---will eventually need AFO 7. Left open tibial fracture s/p I and D with ORIF: CAM boot.  8.   H/o of MDD: Was on Trintellix 5 mg and Risperidone 5 mg bid at home--continue to hold for now.            -behavior reasonable at present although he can be anxious 9.  Neurogenic bowel: Seems to be having constant bowel movements every other day.  Did recommend the use of a daily suppository to help stimulate more regular  movements 10. Urine retention/neurogenic bladder: foley shall continue until mobility improves.    Follow up with me or NP in about 1 month. 25 minutes  were spent in direct patient contact. Greater than 50% time was spent reviewing pain mgt considerations, therapy, orthotics

## 2017-08-13 ENCOUNTER — Encounter: Payer: Self-pay | Admitting: Physical Medicine & Rehabilitation

## 2017-08-14 ENCOUNTER — Telehealth: Payer: Self-pay

## 2017-08-14 NOTE — Telephone Encounter (Signed)
Patient called, stated he has been having an issue with increased swelling in both legs for last 1-2 weeks and was wishing for some advise on what would help with that. I consulted with Dr. Naaman Plummer about issue and he suggested to use ace wraps and wrap 4in wraps from the toes up to the knee on both legs tightly and to keep them elevated.  Information was relayed to patient.

## 2017-08-15 ENCOUNTER — Telehealth: Payer: Self-pay

## 2017-08-15 NOTE — Telephone Encounter (Signed)
[name] [facility] called requesting verbal orders for an extension of OT for 1xwk X 3wks, called her back and approved her orders.

## 2017-08-15 NOTE — Telephone Encounter (Signed)
Requesting ext of Pt  For 2xwk X 5wks, orders were approved at time, she also is reporting that his urine is extremely dark and cloudy with very bad smell, also having a slight altered mental status, no other symptoms reported, has not seen primary yet, she left message with his primary also but is waiting for notice.

## 2017-08-16 NOTE — Telephone Encounter (Signed)
Extend therapy. Request UA and UCX. thx

## 2017-08-20 NOTE — Telephone Encounter (Signed)
Left message on secure VM for [name] that if the Primary MD did not address the urinary symptoms, Dr Naaman Plummer said for them to collect ua/c&s and take to lab.

## 2017-08-21 ENCOUNTER — Telehealth: Payer: Self-pay

## 2017-08-21 ENCOUNTER — Other Ambulatory Visit: Payer: Self-pay | Admitting: Physical Medicine & Rehabilitation

## 2017-08-21 NOTE — Telephone Encounter (Signed)
No mention of these medication in his Last/First note for you, please advise

## 2017-08-21 NOTE — Telephone Encounter (Signed)
Gertha Calkin called for patient and needs to know if he should still continue potassium because he has finished all that was given, and if so he will need another refill on that as well as his clonazepam. Also his pharmacy is asking for approval on his ibuprofen that was ordered for his headaches.

## 2017-08-21 NOTE — Telephone Encounter (Signed)
I would like to discontinue both medications

## 2017-08-22 NOTE — Telephone Encounter (Signed)
Called mrs Hinton back, no answer, left message to call back

## 2017-08-28 ENCOUNTER — Telehealth: Payer: Self-pay | Admitting: *Deleted

## 2017-08-28 DIAGNOSIS — M542 Cervicalgia: Secondary | ICD-10-CM

## 2017-08-28 DIAGNOSIS — G894 Chronic pain syndrome: Secondary | ICD-10-CM

## 2017-08-28 DIAGNOSIS — G8250 Quadriplegia, unspecified: Secondary | ICD-10-CM

## 2017-08-28 MED ORDER — OXYCODONE HCL 10 MG PO TABS: 10.0000 mg | ORAL_TABLET | Freq: Three times a day (TID) | ORAL | 0 refills | Status: DC | PRN

## 2017-08-28 NOTE — Telephone Encounter (Signed)
I spoke with the patient and explained Dr. Charm Barges  Position was that he was not going to continue prescribing potassium and klonopin. I counseled the patient to set up appt with PCP to address issues with anxiety and whatever else that may be of concern.  I advised that ibuprofen can be purchased at a pharmacy over-the-counter.

## 2017-08-28 NOTE — Telephone Encounter (Signed)
Patients wife called to clarify situation involving patients medication, appointments, insurance, and transportation.  Patient was see on 07/31/2017 by Dr. Naaman Plummer.  He has to be transported and the family is still working out the kinks in regards to insurance and transportation.  Patient was discharged from [facility] on 07/22/2017. They have next appointment with Dr. Naaman Plummer established January 14th, 2019. Patients after-visit-summary indicated follow up in about 1 month. Why that ended up being almost 2 months is apparently because of doubts surrounding insurance coverage and transportation. The medication concern here is they are gonna run out of Oxycodone 10 mg which was prescribed by Dr. Naaman Plummer on 07/31/2017. They are asking for a script to continue that medication.  There is also a concern regarding clonazepam and Quetiapine. The timing of this is critical due to the fact that Dr. Naaman Plummer is going out on vacation soon and the holidays are coming up. Please read previous phone message as it pertains to clonazepam.

## 2017-08-29 MED ORDER — QUETIAPINE FUMARATE 50 MG PO TABS: 50.0000 mg | ORAL_TABLET | Freq: Two times a day (BID) | ORAL | 0 refills | Status: DC

## 2017-08-29 NOTE — Telephone Encounter (Signed)
Spoke with Mancel Parsons regarding [name] on 08/28/2017, we will allow the Oxycodone prescription to be picked up. Klonopin will be discontinued per Dr. Naaman Plummer recommendations. We will refill Quetiapine. Yvone Neu spoke to Ms. Wise on yesterday and she is aware of the above.

## 2017-09-23 ENCOUNTER — Encounter: Payer: Medicaid Other | Attending: Physical Medicine & Rehabilitation | Admitting: Physical Medicine & Rehabilitation

## 2017-09-23 ENCOUNTER — Encounter: Payer: Self-pay | Admitting: Physical Medicine & Rehabilitation

## 2017-09-23 VITALS — BP 116/79 | HR 99 | Resp 14

## 2017-09-23 DIAGNOSIS — E559 Vitamin D deficiency, unspecified: Secondary | ICD-10-CM | POA: Insufficient documentation

## 2017-09-23 DIAGNOSIS — M21372 Foot drop, left foot: Secondary | ICD-10-CM | POA: Insufficient documentation

## 2017-09-23 DIAGNOSIS — G894 Chronic pain syndrome: Secondary | ICD-10-CM | POA: Diagnosis present

## 2017-09-23 DIAGNOSIS — G8250 Quadriplegia, unspecified: Secondary | ICD-10-CM | POA: Diagnosis not present

## 2017-09-23 DIAGNOSIS — F329 Major depressive disorder, single episode, unspecified: Secondary | ICD-10-CM | POA: Insufficient documentation

## 2017-09-23 DIAGNOSIS — N319 Neuromuscular dysfunction of bladder, unspecified: Secondary | ICD-10-CM | POA: Insufficient documentation

## 2017-09-23 DIAGNOSIS — M542 Cervicalgia: Secondary | ICD-10-CM

## 2017-09-23 DIAGNOSIS — S062X9A Diffuse traumatic brain injury with loss of consciousness of unspecified duration, initial encounter: Secondary | ICD-10-CM | POA: Diagnosis not present

## 2017-09-23 DIAGNOSIS — Z720 Tobacco use: Secondary | ICD-10-CM | POA: Diagnosis not present

## 2017-09-23 DIAGNOSIS — K592 Neurogenic bowel, not elsewhere classified: Secondary | ICD-10-CM | POA: Diagnosis not present

## 2017-09-23 DIAGNOSIS — S069X3S Unspecified intracranial injury 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, sequela: Secondary | ICD-10-CM

## 2017-09-23 DIAGNOSIS — S14104A Unspecified injury at C4 level of cervical spinal cord, initial encounter: Secondary | ICD-10-CM | POA: Insufficient documentation

## 2017-09-23 MED ORDER — QUETIAPINE FUMARATE 50 MG PO TABS: 50.0000 mg | ORAL_TABLET | Freq: Two times a day (BID) | ORAL | 3 refills | Status: DC

## 2017-09-23 MED ORDER — VENLAFAXINE HCL 75 MG PO TABS: 75.0000 mg | ORAL_TABLET | Freq: Two times a day (BID) | ORAL | 3 refills | Status: DC

## 2017-09-23 MED ORDER — GABAPENTIN 600 MG PO TABS: 600.0000 mg | ORAL_TABLET | Freq: Three times a day (TID) | ORAL | 3 refills | Status: DC

## 2017-09-23 MED ORDER — METHOCARBAMOL 500 MG PO TABS: 500.0000 mg | ORAL_TABLET | Freq: Three times a day (TID) | ORAL | 3 refills | Status: DC | PRN

## 2017-09-23 MED ORDER — OXYCODONE HCL 10 MG PO TABS: 10.0000 mg | ORAL_TABLET | Freq: Two times a day (BID) | ORAL | 0 refills | Status: DC | PRN

## 2017-09-23 NOTE — Patient Instructions (Addendum)
PLEASE FEEL FREE TO CALL OUR OFFICE WITH ANY PROBLEMS OR QUESTIONS ([phone])   Bladder:  Take out foley, begin emptying trial.  You need to stand to urinate or be sitting on commode.  If you struggle to empty, then we can start you on a medication to help jump start your bladder.      Consuella Lose   Specialty:  Neurosurgery Why:  for follow up on cervical fracture/repeat X rays and input on collar Contact information: [address] [place] [address] [place] [address] [phone]   Haddix, Thomasene Lot, MD.    Specialty:  Orthopedic Surgery Why:  for follow up appointment in 3 weeks.  Contact information:  [address] [place] [address] [phone]

## 2017-09-23 NOTE — Progress Notes (Signed)
Subjective:    Patient ID: [name], male    DOB: 1965/10/20, 52 y.o.   MRN: [id number]  HPI  [name] is here in follow up of his TBI and cervical SCI. He is walking for short distances with his CAM boots.  Home health has finished at the house.  He is not eligible for outpatient therapy yet as his disability is still pending.  He is doing some exercises on his own.  His pain levels are generally decreased.  He is using oxycodone 10 mg 2-3 times per day.  He continues on gabapentin 600 mg 3 times daily which seems to have helped pain also.  I asked him if he followed up with neurosurgery or orthopedic surgery and he stated he had not due to cost considerations.  He still wearing a cervical collar and bilateral cam boots.  He is wearing a Foley catheter still.  He did have a spontaneous void a couple weeks ago when his wife remove the Foley for maintenance.  His outlook has improved.  Apparently ophthalmology has signed off regarding his eyes.  He still has a few wounds on the legs but these are healing as well.  He is emptying his bowels.     Pain Inventory Average Pain 6 Pain Right Now 5 My pain is burning  In the last 24 hours, has pain interfered with the following? General activity 10 Relation with others 10 Enjoyment of life 10 What TIME of day is your pain at its worst? morning Sleep (in general) Poor  Pain is worse with: walking, bending, sitting, inactivity, standing and unsure Pain improves with: heat/ice and medication Relief from Meds: 6  Mobility walk with assistance use a walker ability to climb steps?  yes do you drive?  no use a wheelchair needs help with transfers  Function disabled: date disabled .  Neuro/Psych numbness spasms depression anxiety  Prior Studies Any changes since last visit?  no  Physicians involved in your care Any changes since last visit?  no   Family History  Problem Relation Age of Onset  . Hypothyroidism  Mother   . Hypertension Father    Social History   Socioeconomic History  . Marital status: Married    Spouse name: [name]  . Number of children: 4  . Years of education: 13  . Highest education level: None  Social Needs  . Financial resource strain: None  . Food insecurity - worry: None  . Food insecurity - inability: None  . Transportation needs - medical: None  . Transportation needs - non-medical: None  Occupational History    Comment: unemployed  Tobacco Use  . Smoking status: Smoker, Current Status Unknown  . Smokeless tobacco: Never Used  Substance and Sexual Activity  . Alcohol use: Yes    Comment: 1 pint daily  . Drug use: Yes    Types: Cocaine  . Sexual activity: Yes  Other Topics Concern  . None  Social History Narrative   ** Merged History Encounter **       Lives at home wife, children Caffeine use- coffee 2-3 cups    Past Surgical History:  Procedure Laterality Date  . BACK SURGERY     yrs ago  . CLOSED REDUCTION MANDIBLE N/A 05/07/2017   Procedure: CLOSED REDUCTION MANDIBULAR;  Surgeon: Melida Quitter, MD;  Location[facility];  Service: ENT;  Laterality: N/A;  . ESOPHAGOGASTRODUODENOSCOPY N/A 05/07/2017   Procedure: ESOPHAGOGASTRODUODENOSCOPY (EGD);  Surgeon: Georganna Skeans, MD;  Location: [facility] OR;  Service: General;  Laterality: N/A;  . EXTERNAL FIXATION LEG Left 05/03/2017   Procedure: EXTERNAL FIXATION LEFT LOWER LEG;  Surgeon: Meredith Pel, MD;  Location: [place];  Service: Orthopedics;  Laterality: Left;  . EXTERNAL FIXATION REMOVAL Left 05/16/2017   Procedure: REMOVAL EXTERNAL FIXATION LEG;  Surgeon: Shona Needles, MD;  Location: [place];  Service: Orthopedics;  Laterality: Left;  . FEMUR IM NAIL Left 05/03/2017   Procedure: LEFT INTRAMEDULLARY (IM) NAIL FEMORAL;  Surgeon: Meredith Pel, MD;  Location: [place];  Service: Orthopedics;  Laterality: Left;  . I&D EXTREMITY Left 05/03/2017   Procedure: IRRIGATION AND DEBRIDEMENT LEFT LOWER EXTREMITY;   Surgeon: Meredith Pel, MD;  Location: [place];  Service: Orthopedics;  Laterality: Left;  . I&D EXTREMITY Left 05/07/2017   Procedure: IRRIGATION AND DEBRIDEMENT, LEFT TIBIA  EX-FIX ADJUSTMENT;  Surgeon: Shona Needles, MD;  Location: [place];  Service: Orthopedics;  Laterality: Left;  . INCISION AND DRAINAGE OF WOUND Right 05/03/2017   Procedure: IRRIGATION AND DEBRIDEMENT WOUND;  Surgeon: Meredith Pel, MD;  Location: [place];  Service: Orthopedics;  Laterality: Right;  . MANDIBULAR HARDWARE REMOVAL N/A 06/26/2017   Procedure: MANDIBULAR HARDWARE REMOVAL;  Surgeon: Melida Quitter, MD;  Location: [place];  Service: ENT;  Laterality: N/A;  . ORIF TIBIA FRACTURE Left 05/16/2017   Procedure: OPEN REDUCTION INTERNAL FIXATION (ORIF) LEFT DISTAL TIBIA FRACTURE, REMOVAL OF EX-FIX;  Surgeon: Shona Needles, MD;  Location: [place];  Service: Orthopedics;  Laterality: Left;  . PEG PLACEMENT N/A 05/07/2017   Procedure: PERCUTANEOUS ENDOSCOPIC GASTROSTOMY (PEG) PLACEMENT;  Surgeon: Georganna Skeans, MD;  Location: [place];  Service: General;  Laterality: N/A;  . POSTERIOR CERVICAL FUSION/FORAMINOTOMY N/A 05/10/2017   Procedure: POSTERIOR CERVICAL FUSION OCCIPUT- CERVICAL FOUR;  Surgeon: Consuella Lose, MD;  Location: [place];  Service: Neurosurgery;  Laterality: N/A;  . TRACHEOSTOMY TUBE PLACEMENT N/A 05/07/2017   Procedure: TRACHEOSTOMY;  Surgeon: Melida Quitter, MD;  Location: [place];  Service: ENT;  Laterality: N/A;   Past Medical History:  Diagnosis Date  . Chronic back pain   . History of suicidal ideation 01/2017   with overdose/ [facility] admission  . Lumbago with sciatica    left side  . Vitamin D deficiency    There were no vitals taken for this visit.  Opioid Risk Score:   Fall Risk Score:  `1  Depression screen PHQ 2/9  No flowsheet data found.  Review of Systems  Constitutional: Positive for diaphoresis and unexpected weight change.  HENT: Negative.   Eyes: Negative.   Respiratory:  Positive for shortness of breath.   Endocrine: Negative.   Genitourinary: Negative.   Musculoskeletal: Positive for arthralgias, back pain and gait problem.       Spasms  Skin: Positive for rash.  Allergic/Immunologic: Negative.   Neurological: Positive for numbness.  Hematological: Negative.   Psychiatric/Behavioral: Positive for dysphoric mood. The patient is nervous/anxious.        Objective:   Physical Exam  General: Alert and oriented x 3, No apparent distress HEENT:  Eyes with minimal irritation.,  Neck: Supple without JVD or lymphadenopathy.  Wearing cervical collar Heart: RRR Chest:  Normal effort Abdomen: Soft, non-tender, non-distended, bowel sounds positive. Extremities: No clubbing, cyanosis, or edema. Pulses are 2+ Skin:  Abrasions and wounds all healing on both legs neuro: Pt is cognitively appropriate with normal insight, memory, and awareness.  Eye-movement continues to improve.  .   Reflexes are  1 in all 4's. Fine motor coordination is intact. No tremors.  Upper extremity motor function grossly 2+ to 3+ out of 5 proximal to distal.  Right hip flexion is 3 out of 5 knee extension 3+ ankle dorsiflexion and plantar flexion 3+ out of 5.  Left lower extremity 2/5 ADF/PF.  He had no ankle dorsiflexion or plantarflexion noted today.  altered sensation in left leg more than right. .  Musculoskeletal:  He ambulated with a rolling walker today using bilateral cam boots.  He is antalgic on either leg.  Both ankles are somewhat tender to palpation and have some associated swelling. Uro: Wearing leg bag Psych:  Affect pleasant and appropriate        Assessment & Plan:  1. Functional, cognitive and mobility deficitssecondary to Moderate TBI, C4 spinal cord injury -continue HEP.    -would like to progress to outpt therapies soon. He's looking into [facility] program also   -Recommend a neurosurgery follow-up regarding his collar 2  Chronic neck pain/Pain Management/  recent fall at home - continue gabapentin at 600mg  TID    - continue oxycodone again for pain 10mg  q8 prn #90, reduce to #60 q12 prn.  Goal is to wean to off   -may use tylenol and ibuprofen prn   We will continue the opioid monitoring program, this consists of regular clinic visits, examinations, routine drug screening, pill counts as well as use of [place] Controlled Substance Reporting System. [place].   4.  Skin: Continue local measures to legs as these are slowly healing.  5  Left periorbital cellulitis/ CN III/VI palsy: -Outpatient ophthalmology follow-up as needed  .6Left femur fracture s/p IM nail: now WBAT bilateral LE's -peristent left foot drop---has showed some improvement   -He still needs orthopedic follow-up 7. Left open tibial fracture s/p I and D with ORIF: CAM boot.   Orth O follow-up recommended 8.   H/o of MDD: Was on Trintellix 5 mg and Risperidone 5 mg bid at home--continue to hold for now.  -continue effexor and seroquel which are working well for him 9.  Neurogenic bowel: Bowel function normalizing 10. Urine retention/neurogenic bladder:      -would like to begin voiding trial.  Spoke with his wife in this regard.   -dc foley   -urecholine could be considered if he fails to empty    Follow up with me or NP in about 2 month. 25 minutes were spent in direct patient contact. Greater than 50% of time during this encounter was spent counseling patient/family in regard to pain mgt, orthopedic consider. Marland Kitchen

## 2017-09-24 NOTE — Progress Notes (Signed)
Dr. Naaman Plummer declined drug screen for todays visit. Clinic note indicates a goal to wean off medication

## 2017-09-30 ENCOUNTER — Telehealth: Payer: Self-pay

## 2017-09-30 MED ORDER — BETHANECHOL CHLORIDE 25 MG PO TABS: 25.0000 mg | ORAL_TABLET | Freq: Three times a day (TID) | ORAL | 1 refills | Status: DC

## 2017-09-30 NOTE — Telephone Encounter (Signed)
Patients wife called, stated that he was supposed to receive medication to help with his bladder and urination after his last visit but has not recieved anything yet.  Please advise.

## 2017-09-30 NOTE — Telephone Encounter (Signed)
This is what I actually said:  10. Urine retention/neurogenic bladder:            -would like to begin voiding trial.  Spoke with his wife in this regard.         -dc foley         -urecholine could be considered if he fails to empty  So I would assume by the phone call that he is not emptying.  Therefore we can start Urecholine 25 mg 3 times daily #90 one refill.

## 2017-11-17 ENCOUNTER — Emergency Department (HOSPITAL_COMMUNITY)
Admission: EM | Admit: 2017-11-17 | Discharge: 2017-11-17 | Disposition: A | Discharge status: Home / Self Care | Payer: Self-pay | Attending: Emergency Medicine | Admitting: Emergency Medicine

## 2017-11-17 ENCOUNTER — Emergency Department (HOSPITAL_BASED_OUTPATIENT_CLINIC_OR_DEPARTMENT_OTHER)
Admit: 2017-11-17 | Discharge: 2017-11-17 | Disposition: A | Discharge status: Home / Self Care | Payer: Self-pay | Attending: Student | Admitting: Student

## 2017-11-17 ENCOUNTER — Other Ambulatory Visit: Payer: Self-pay

## 2017-11-17 ENCOUNTER — Emergency Department (HOSPITAL_COMMUNITY): Payer: Self-pay

## 2017-11-17 DIAGNOSIS — F17200 Nicotine dependence, unspecified, uncomplicated: Secondary | ICD-10-CM | POA: Insufficient documentation

## 2017-11-17 DIAGNOSIS — G8929 Other chronic pain: Secondary | ICD-10-CM | POA: Insufficient documentation

## 2017-11-17 DIAGNOSIS — M5441 Lumbago with sciatica, right side: Secondary | ICD-10-CM | POA: Insufficient documentation

## 2017-11-17 DIAGNOSIS — M5442 Lumbago with sciatica, left side: Secondary | ICD-10-CM | POA: Insufficient documentation

## 2017-11-17 DIAGNOSIS — M79604 Pain in right leg: Secondary | ICD-10-CM

## 2017-11-17 DIAGNOSIS — M79609 Pain in unspecified limb: Secondary | ICD-10-CM

## 2017-11-17 DIAGNOSIS — M79605 Pain in left leg: Secondary | ICD-10-CM

## 2017-11-17 DIAGNOSIS — M79662 Pain in left lower leg: Secondary | ICD-10-CM | POA: Insufficient documentation

## 2017-11-17 DIAGNOSIS — Z79899 Other long term (current) drug therapy: Secondary | ICD-10-CM | POA: Insufficient documentation

## 2017-11-17 DIAGNOSIS — M79661 Pain in right lower leg: Secondary | ICD-10-CM | POA: Insufficient documentation

## 2017-11-17 DIAGNOSIS — R609 Edema, unspecified: Secondary | ICD-10-CM

## 2017-11-17 DIAGNOSIS — N3001 Acute cystitis with hematuria: Secondary | ICD-10-CM | POA: Insufficient documentation

## 2017-11-17 LAB — CBC WITH DIFFERENTIAL/PLATELET
BASOS ABS: 0 10*3/uL (ref 0.0–0.1)
BASOS PCT: 0 %
EOS ABS: 0.3 10*3/uL (ref 0.0–0.7)
Eosinophils Relative: 5 %
HEMATOCRIT: 38 % — AB (ref 39.0–52.0)
Hemoglobin: 12.1 g/dL — ABNORMAL LOW (ref 13.0–17.0)
Lymphocytes Relative: 48 %
Lymphs Abs: 2.6 10*3/uL (ref 0.7–4.0)
MCH: 26.2 pg (ref 26.0–34.0)
MCHC: 31.8 g/dL (ref 30.0–36.0)
MCV: 82.3 fL (ref 78.0–100.0)
MONO ABS: 0.5 10*3/uL (ref 0.1–1.0)
Monocytes Relative: 9 %
NEUTROS PCT: 38 %
Neutro Abs: 2 10*3/uL (ref 1.7–7.7)
Platelets: 354 10*3/uL (ref 150–400)
RBC: 4.62 MIL/uL (ref 4.22–5.81)
RDW: 16.9 % — AB (ref 11.5–15.5)
WBC: 5.3 10*3/uL (ref 4.0–10.5)

## 2017-11-17 LAB — COMPREHENSIVE METABOLIC PANEL
ALK PHOS: 123 U/L (ref 38–126)
ALT: 15 U/L — AB (ref 17–63)
ANION GAP: 10 (ref 5–15)
AST: 32 U/L (ref 15–41)
Albumin: 3.4 g/dL — ABNORMAL LOW (ref 3.5–5.0)
BILIRUBIN TOTAL: 1.2 mg/dL (ref 0.3–1.2)
BUN: 8 mg/dL (ref 6–20)
CALCIUM: 9.3 mg/dL (ref 8.9–10.3)
CO2: 25 mmol/L (ref 22–32)
CREATININE: 0.72 mg/dL (ref 0.61–1.24)
Chloride: 103 mmol/L (ref 101–111)
GFR calc non Af Amer: >60 mL/min (ref >60)
GLUCOSE: 93 mg/dL (ref 65–99)
Potassium: 4.7 mmol/L (ref 3.5–5.1)
Sodium: 138 mmol/L (ref 135–145)
TOTAL PROTEIN: 7.3 g/dL (ref 6.5–8.1)

## 2017-11-17 LAB — URINALYSIS, ROUTINE W REFLEX MICROSCOPIC
Bilirubin Urine: NEGATIVE
Glucose, UA: NEGATIVE mg/dL
Ketones, ur: NEGATIVE mg/dL
NITRITE: POSITIVE — AB
Protein, ur: 100 mg/dL — AB
SPECIFIC GRAVITY, URINE: 1.023 (ref 1.005–1.030)
Trans Epithel, UA: 1
pH: 5 (ref 5.0–8.0)

## 2017-11-17 MED ORDER — CEPHALEXIN 500 MG PO CAPS: 500.0000 mg | ORAL_CAPSULE | Freq: Two times a day (BID) | ORAL | 0 refills | Status: DC

## 2017-11-17 MED ORDER — PHENAZOPYRIDINE HCL 100 MG PO TABS
200.0000 mg | ORAL_TABLET | Freq: Once | ORAL | Status: AC
  Administered 2017-11-17: 200 mg via ORAL
  Filled 2017-11-17: qty 2

## 2017-11-17 MED ORDER — LIDOCAINE 5 % EX PTCH: 1.0000 | MEDICATED_PATCH | CUTANEOUS | 0 refills | Status: DC

## 2017-11-17 MED ORDER — ACETAMINOPHEN 500 MG PO TABS
1000.0000 mg | ORAL_TABLET | Freq: Once | ORAL | Status: AC
Start: 2017-11-17 — End: 2017-11-17
  Administered 2017-11-17: 1000 mg via ORAL
  Filled 2017-11-17: qty 2

## 2017-11-17 MED ORDER — KETOROLAC TROMETHAMINE 15 MG/ML IJ SOLN
15.0000 mg | Freq: Once | INTRAMUSCULAR | Status: AC
  Administered 2017-11-17: 15 mg via INTRAVENOUS
  Filled 2017-11-17: qty 1

## 2017-11-17 MED ORDER — SODIUM CHLORIDE 0.9 % IV SOLN
1.0000 g | Freq: Once | INTRAVENOUS | Status: AC
  Administered 2017-11-17: 1 g via INTRAVENOUS
  Filled 2017-11-17: qty 10

## 2017-11-17 MED ORDER — TRAMADOL HCL 50 MG PO TABS
50.0000 mg | ORAL_TABLET | Freq: Once | ORAL | Status: AC
  Administered 2017-11-17: 50 mg via ORAL
  Filled 2017-11-17: qty 1

## 2017-11-17 MED ORDER — PHENAZOPYRIDINE HCL 200 MG PO TABS: 200.0000 mg | ORAL_TABLET | Freq: Three times a day (TID) | ORAL | 0 refills | Status: DC
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
## 2017-11-17 NOTE — ED Notes (Signed)
Pt attempting to urinate -- states is having severe pain- 10/10-- bladder scanned for 150cc-- PA NOTIFIED.
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
## 2017-11-17 NOTE — Progress Notes (Addendum)
VASCULAR LAB PRELIMINARY  PRELIMINARY  PRELIMINARY  PRELIMINARY  Bilateral lower extremity venous duplex completed.    Preliminary report:  There is no DVT or SVT noted in the bilateral lower extremities.    Called report to Melina Schools, PA-C  Sten Dematteo, [place], RVT 11/17/2017, 6:28 PM

## 2017-11-17 NOTE — ED Notes (Signed)
Pt understood dc material. NAD noted. Scripts given at dc 

## 2017-11-18 LAB — URINE CULTURE

## 2017-11-21 ENCOUNTER — Encounter: Payer: Self-pay | Attending: Physical Medicine & Rehabilitation | Admitting: Registered Nurse

## 2017-11-21 DIAGNOSIS — G894 Chronic pain syndrome: Secondary | ICD-10-CM | POA: Insufficient documentation

## 2017-11-21 DIAGNOSIS — G8250 Quadriplegia, unspecified: Secondary | ICD-10-CM | POA: Insufficient documentation

## 2017-11-21 DIAGNOSIS — K592 Neurogenic bowel, not elsewhere classified: Secondary | ICD-10-CM | POA: Insufficient documentation

## 2017-11-21 DIAGNOSIS — M542 Cervicalgia: Secondary | ICD-10-CM | POA: Insufficient documentation

## 2017-11-21 DIAGNOSIS — E559 Vitamin D deficiency, unspecified: Secondary | ICD-10-CM | POA: Insufficient documentation

## 2017-11-21 DIAGNOSIS — F329 Major depressive disorder, single episode, unspecified: Secondary | ICD-10-CM | POA: Insufficient documentation

## 2017-11-21 DIAGNOSIS — S14104A Unspecified injury at C4 level of cervical spinal cord, initial encounter: Secondary | ICD-10-CM | POA: Insufficient documentation

## 2017-11-21 DIAGNOSIS — M21372 Foot drop, left foot: Secondary | ICD-10-CM | POA: Insufficient documentation

## 2017-11-21 DIAGNOSIS — Z720 Tobacco use: Secondary | ICD-10-CM | POA: Insufficient documentation

## 2017-11-21 DIAGNOSIS — S062X9A Diffuse traumatic brain injury with loss of consciousness of unspecified duration, initial encounter: Secondary | ICD-10-CM | POA: Insufficient documentation

## 2017-11-21 DIAGNOSIS — N319 Neuromuscular dysfunction of bladder, unspecified: Secondary | ICD-10-CM | POA: Insufficient documentation

## 2018-01-11 ENCOUNTER — Other Ambulatory Visit: Payer: Self-pay | Admitting: Physical Medicine & Rehabilitation

## 2018-01-15 NOTE — Telephone Encounter (Signed)
Refill sent.

## 2018-01-16 ENCOUNTER — Other Ambulatory Visit: Payer: Self-pay | Admitting: Physical Medicine & Rehabilitation

## 2018-01-16 DIAGNOSIS — G894 Chronic pain syndrome: Secondary | ICD-10-CM

## 2018-01-22 ENCOUNTER — Telehealth: Payer: Self-pay

## 2018-01-22 DIAGNOSIS — G894 Chronic pain syndrome: Secondary | ICD-10-CM

## 2018-01-22 MED ORDER — METHOCARBAMOL 500 MG PO TABS: 500.0000 mg | ORAL_TABLET | Freq: Three times a day (TID) | ORAL | 0 refills | Status: DC | PRN

## 2018-01-22 NOTE — Telephone Encounter (Signed)
Recieved call from patients pharmacy for refill of robaxin

## 2018-02-13 ENCOUNTER — Other Ambulatory Visit: Payer: Self-pay | Admitting: Physical Medicine & Rehabilitation

## 2018-02-20 ENCOUNTER — Encounter (HOSPITAL_COMMUNITY): Payer: Self-pay | Admitting: *Deleted

## 2018-02-20 ENCOUNTER — Other Ambulatory Visit: Payer: Self-pay

## 2018-02-20 ENCOUNTER — Emergency Department (HOSPITAL_COMMUNITY): Payer: Self-pay

## 2018-02-20 ENCOUNTER — Emergency Department (HOSPITAL_COMMUNITY)
Admission: EM | Admit: 2018-02-20 | Discharge: 2018-02-20 | Disposition: A | Discharge status: Home / Self Care | Payer: Self-pay | Attending: Emergency Medicine | Admitting: Emergency Medicine

## 2018-02-20 ENCOUNTER — Other Ambulatory Visit: Payer: Self-pay | Admitting: Physical Medicine & Rehabilitation

## 2018-02-20 DIAGNOSIS — Y939 Activity, unspecified: Secondary | ICD-10-CM | POA: Insufficient documentation

## 2018-02-20 DIAGNOSIS — S8002XA Contusion of left knee, initial encounter: Secondary | ICD-10-CM | POA: Insufficient documentation

## 2018-02-20 DIAGNOSIS — Y929 Unspecified place or not applicable: Secondary | ICD-10-CM | POA: Insufficient documentation

## 2018-02-20 DIAGNOSIS — R55 Syncope and collapse: Secondary | ICD-10-CM | POA: Insufficient documentation

## 2018-02-20 DIAGNOSIS — W07XXXA Fall from chair, initial encounter: Secondary | ICD-10-CM | POA: Insufficient documentation

## 2018-02-20 DIAGNOSIS — S161XXA Strain of muscle, fascia and tendon at neck level, initial encounter: Secondary | ICD-10-CM | POA: Insufficient documentation

## 2018-02-20 DIAGNOSIS — Z79899 Other long term (current) drug therapy: Secondary | ICD-10-CM | POA: Insufficient documentation

## 2018-02-20 DIAGNOSIS — S0990XA Unspecified injury of head, initial encounter: Secondary | ICD-10-CM | POA: Insufficient documentation

## 2018-02-20 DIAGNOSIS — Y999 Unspecified external cause status: Secondary | ICD-10-CM | POA: Insufficient documentation

## 2018-02-20 DIAGNOSIS — F17200 Nicotine dependence, unspecified, uncomplicated: Secondary | ICD-10-CM | POA: Insufficient documentation

## 2018-02-20 DIAGNOSIS — G894 Chronic pain syndrome: Secondary | ICD-10-CM

## 2018-02-20 LAB — URINALYSIS, ROUTINE W REFLEX MICROSCOPIC
Bilirubin Urine: NEGATIVE
Glucose, UA: NEGATIVE mg/dL
KETONES UR: NEGATIVE mg/dL
Nitrite: NEGATIVE
PROTEIN: 30 mg/dL — AB
Specific Gravity, Urine: 1.006 (ref 1.005–1.030)
pH: 7 (ref 5.0–8.0)

## 2018-02-20 LAB — CBC
HEMATOCRIT: 43 % (ref 39.0–52.0)
Hemoglobin: 13.7 g/dL (ref 13.0–17.0)
MCH: 26.4 pg (ref 26.0–34.0)
MCHC: 31.9 g/dL (ref 30.0–36.0)
MCV: 82.9 fL (ref 78.0–100.0)
Platelets: 246 10*3/uL (ref 150–400)
RBC: 5.19 MIL/uL (ref 4.22–5.81)
RDW: 17.5 % — ABNORMAL HIGH (ref 11.5–15.5)
WBC: 9.3 10*3/uL (ref 4.0–10.5)

## 2018-02-20 LAB — BASIC METABOLIC PANEL
Anion gap: 10 (ref 5–15)
BUN: 7 mg/dL (ref 6–20)
CHLORIDE: 101 mmol/L (ref 101–111)
CO2: 26 mmol/L (ref 22–32)
CREATININE: 0.74 mg/dL (ref 0.61–1.24)
Calcium: 9.5 mg/dL (ref 8.9–10.3)
GFR calc non Af Amer: >60 mL/min (ref >60)
Glucose, Bld: 116 mg/dL — ABNORMAL HIGH (ref 65–99)
POTASSIUM: 4 mmol/L (ref 3.5–5.1)
Sodium: 137 mmol/L (ref 135–145)

## 2018-02-20 MED ORDER — IBUPROFEN 800 MG PO TABS: 800.0000 mg | ORAL_TABLET | Freq: Once | ORAL | Status: DC

## 2018-02-20 NOTE — ED Notes (Addendum)
PT Left AMA when I came into room. Stated he is tired of waiting and we were not helping him. Iv removed
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
## 2018-02-20 NOTE — ED Notes (Signed)
Entered room to attempt to admin ibuprofen. Pt stated he did not wish to take. Stated he had medication at home and that he did not want to be charged for it. Advised pt I would let doc know he refused and he became verbally aggressive and shouted "I'm not refusing I just dont want to take it" Asked pt to lower voice and he stated "No I've been here six &^%*(& hours and no ones doing anything to help me" Explained to pt care process and things we had already done for him and at this time [employer] stepped in due to being next door w/ other pt and overhearing pt being uncooperative. Pt stated "Oh ok, you doing this cause you racist. i'm a black man and you bring your white cop friend in here cause you feeling all bad with the cops next door". Explained to pt that he would need to lower his voice and behave appropriately and pt was non compliant w/ staff and [employer] request. MD Delo to bedside, able to redirect pt. Pt to CT scan w/ Cristie Hem and security standby.

## 2018-02-25 ENCOUNTER — Other Ambulatory Visit: Payer: Self-pay | Admitting: Physical Medicine & Rehabilitation

## 2018-02-25 DIAGNOSIS — S069X3S Unspecified intracranial injury 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, sequela: Secondary | ICD-10-CM

## 2018-03-27 ENCOUNTER — Other Ambulatory Visit: Payer: Self-pay | Admitting: Physical Medicine & Rehabilitation

## 2018-03-27 DIAGNOSIS — S069X3S Unspecified intracranial injury 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, sequela: Secondary | ICD-10-CM

## 2018-04-10 ENCOUNTER — Other Ambulatory Visit: Payer: Self-pay | Admitting: Physical Medicine & Rehabilitation

## 2018-04-10 DIAGNOSIS — M542 Cervicalgia: Secondary | ICD-10-CM

## 2018-04-10 DIAGNOSIS — G8250 Quadriplegia, unspecified: Secondary | ICD-10-CM

## 2018-04-10 DIAGNOSIS — G894 Chronic pain syndrome: Secondary | ICD-10-CM

## 2018-04-28 ENCOUNTER — Other Ambulatory Visit: Payer: Self-pay | Admitting: Physical Medicine & Rehabilitation

## 2018-04-28 DIAGNOSIS — S069X3S Unspecified intracranial injury 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, sequela: Secondary | ICD-10-CM

## 2018-05-03 ENCOUNTER — Other Ambulatory Visit: Payer: Self-pay | Admitting: Physical Medicine & Rehabilitation

## 2018-05-03 DIAGNOSIS — G894 Chronic pain syndrome: Secondary | ICD-10-CM

## 2018-05-12 ENCOUNTER — Emergency Department (HOSPITAL_COMMUNITY): Payer: BC Managed Care – PPO

## 2018-05-12 ENCOUNTER — Encounter (HOSPITAL_COMMUNITY): Payer: Self-pay | Admitting: Emergency Medicine

## 2018-05-12 ENCOUNTER — Other Ambulatory Visit: Payer: Self-pay

## 2018-05-12 ENCOUNTER — Emergency Department (HOSPITAL_COMMUNITY)
Admission: EM | Admit: 2018-05-12 | Discharge: 2018-05-12 | Disposition: A | Discharge status: Home / Self Care | Payer: BC Managed Care – PPO | Attending: Emergency Medicine | Admitting: Emergency Medicine

## 2018-05-12 DIAGNOSIS — M545 Low back pain, unspecified: Secondary | ICD-10-CM

## 2018-05-12 DIAGNOSIS — W19XXXA Unspecified fall, initial encounter: Secondary | ICD-10-CM

## 2018-05-12 DIAGNOSIS — Z9181 History of falling: Secondary | ICD-10-CM | POA: Insufficient documentation

## 2018-05-12 DIAGNOSIS — Z79899 Other long term (current) drug therapy: Secondary | ICD-10-CM | POA: Insufficient documentation

## 2018-05-12 DIAGNOSIS — G894 Chronic pain syndrome: Secondary | ICD-10-CM

## 2018-05-12 DIAGNOSIS — F17210 Nicotine dependence, cigarettes, uncomplicated: Secondary | ICD-10-CM | POA: Insufficient documentation

## 2018-05-12 DIAGNOSIS — M542 Cervicalgia: Secondary | ICD-10-CM

## 2018-05-12 MED ORDER — IOPAMIDOL (ISOVUE-370) INJECTION 76%
INTRAVENOUS | Status: AC
  Filled 2018-05-12: qty 50

## 2018-05-12 MED ORDER — METHOCARBAMOL 500 MG PO TABS: 500.0000 mg | ORAL_TABLET | Freq: Three times a day (TID) | ORAL | 0 refills | Status: DC | PRN

## 2018-05-12 MED ORDER — HYDROCODONE-ACETAMINOPHEN 5-325 MG PO TABS
1.0000 | ORAL_TABLET | Freq: Once | ORAL | Status: AC
  Administered 2018-05-12: 1 via ORAL
  Filled 2018-05-12: qty 1

## 2018-05-12 MED ORDER — METHOCARBAMOL 500 MG PO TABS
750.0000 mg | ORAL_TABLET | Freq: Once | ORAL | Status: AC
  Administered 2018-05-12: 750 mg via ORAL
  Filled 2018-05-12: qty 2
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
## 2018-05-12 NOTE — ED Notes (Signed)
Pt discharged from ED; instructions provided and scripts given; Pt encouraged to return to ED if symptoms worsen and to f/u with PCP; Pt verbalized understanding of all instructions 

## 2018-05-12 NOTE — ED Triage Notes (Signed)
Pt fell leaving physical therapy two weeks ago, since then the neck and back has had increased pain.

## 2018-05-12 NOTE — Discharge Instructions (Addendum)
Follow up with your doctor for further care. Take medications as prescribed, your Robxin prescription was sent to your pharmacy.

## 2018-05-12 NOTE — ED Notes (Signed)
Patient transported to X-ray 

## 2018-06-15 ENCOUNTER — Other Ambulatory Visit: Payer: Self-pay | Admitting: Physical Medicine & Rehabilitation

## 2018-06-15 DIAGNOSIS — S069X3S Unspecified intracranial injury 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, sequela: Secondary | ICD-10-CM

## 2018-06-18 ENCOUNTER — Encounter: Payer: Self-pay | Admitting: Nurse Practitioner

## 2018-06-18 ENCOUNTER — Ambulatory Visit: Payer: Self-pay | Admitting: Nurse Practitioner

## 2018-06-18 ENCOUNTER — Other Ambulatory Visit: Payer: Self-pay | Admitting: Nurse Practitioner

## 2018-06-18 VITALS — BP 130/80 | HR 80 | Temp 98.2°F | Ht 65.0 in | Wt 236.0 lb

## 2018-06-18 DIAGNOSIS — W19XXXA Unspecified fall, initial encounter: Secondary | ICD-10-CM

## 2018-06-18 DIAGNOSIS — M79672 Pain in left foot: Secondary | ICD-10-CM

## 2018-06-18 DIAGNOSIS — R300 Dysuria: Secondary | ICD-10-CM

## 2018-06-18 LAB — POCT URINALYSIS DIPSTICK
Bilirubin, UA: NEGATIVE
Glucose, UA: NEGATIVE
Ketones, UA: NEGATIVE
Nitrite, UA: NEGATIVE
PROTEIN UA: POSITIVE — AB
Spec Grav, UA: 1.02 (ref 1.010–1.025)
Urobilinogen, UA: NEGATIVE E.U./dL — AB
pH, UA: 7 (ref 5.0–8.0)

## 2018-06-18 MED ORDER — KETOROLAC TROMETHAMINE 60 MG/2ML IM SOLN
60.0000 mg | Freq: Once | INTRAMUSCULAR | Status: AC
  Administered 2018-06-18: 60 mg via INTRAMUSCULAR

## 2018-06-18 MED ORDER — HYDROCODONE-ACETAMINOPHEN 5-325 MG PO TABS: 1.0000 | ORAL_TABLET | Freq: Four times a day (QID) | ORAL | 0 refills | Status: DC | PRN

## 2018-06-18 NOTE — Progress Notes (Signed)
Subjective:     Patient ID: Vincent Mems Sr. , male    DOB: 11-20-1965 , 52 y.o.   MRN: [id number]   Foot Pain  This is a chronic problem. The current episode started more than 1 month ago. The problem occurs constantly. The problem has been gradually worsening. Associated symptoms include arthralgias and myalgias. Pertinent negatives include no chills or nausea. The symptoms are aggravated by walking. Treatments tried: tramadol ineffective. The treatment provided mild relief.  Dysuria   This is a recurrent problem. The current episode started in the past 7 days. The problem occurs intermittently. The problem has been gradually worsening. The quality of the pain is described as aching (hesitancy). The pain is moderate. There has been no fever. He is not sexually active. There is no history of pyelonephritis. Associated symptoms include hesitancy. Pertinent negatives include no chills, discharge, frequency or nausea. Treatments tried: Treated for UTI several times and had been followed by Urology but can not go back due to no insurance.     Past Medical History:  Diagnosis Date  . Chronic back pain   . History of suicidal ideation 01/2017   with overdose/ [facility] admission  . Lumbago with sciatica    left side  . Vitamin D deficiency       Current Outpatient Medications:  .  bethanechol (URECHOLINE) 25 MG tablet, TAKE 1 TABLET BY MOUTH THREE TIMES A DAY, Disp: 90 tablet, Rfl: 0 .  diclofenac sodium (VOLTAREN) 1 % GEL, Apply 2 g 4 (four) times daily topically., Disp: 4 Tube, Rfl: 0 .  docusate sodium (COLACE) 50 MG capsule, Take 50 mg by mouth 2 (two) times daily., Disp: , Rfl:  .  gabapentin (NEURONTIN) 600 MG tablet, Take 1 tablet (600 mg total) by mouth 3 (three) times daily., Disp: 90 tablet, Rfl: 3 .  lidocaine (LIDODERM) 5 %, Place 1 patch onto the skin daily. Remove & Discard patch within 12 hours or as directed by MD, Disp: 30 patch, Rfl: 0 .  Magnesium Oxide 250 MG TABS, Take  250 tablets by mouth every evening., Disp: , Rfl: 5 .  meloxicam (MOBIC) 7.5 MG tablet, Take 7.5 mg by mouth daily., Disp: , Rfl: 1 .  methocarbamol (ROBAXIN) 500 MG tablet, Take 500 mg by mouth 3 (three) times daily., Disp: , Rfl:  .  naphazoline-glycerin (CLEAR EYES) 0.012-0.2 % SOLN, Place 2 drops 4 (four) times daily -  with meals and at bedtime into both eyes., Disp: 30 mL, Rfl: 0 .  QUEtiapine (SEROQUEL) 50 MG tablet, TAKE 1 TABLET BY MOUTH TWICE A DAY, Disp: 60 tablet, Rfl: 0 .  sertraline (ZOLOFT) 50 MG tablet, Take 50 mg by mouth daily., Disp: , Rfl:  .  tamsulosin (FLOMAX) 0.4 MG CAPS capsule, Take 0.4 mg by mouth daily., Disp: , Rfl:  .  traMADol (ULTRAM) 50 MG tablet, Take 50 mg by mouth every 6 (six) hours as needed for moderate pain., Disp: , Rfl:    Review of Systems  Constitutional: Negative.  Negative for chills.  HENT: Negative.   Respiratory: Negative.   Cardiovascular: Negative.   Gastrointestinal: Negative.  Negative for nausea.  Genitourinary: Positive for decreased urine volume, dysuria and hesitancy. Negative for frequency.  Musculoskeletal: Positive for arthralgias and myalgias.     Today's Vitals   06/18/18 1543  BP: 130/80  Pulse: 80  Temp: 98.2 F (36.8 C)  TempSrc: Oral  SpO2: 94%  Weight: 236 lb (107 kg)  Height: 5'  5" (1.651 m)   Body mass index is 39.27 kg/m.   Objective:  Physical Exam  Constitutional: He appears well-developed and well-nourished.  Cardiovascular: Normal rate, regular rhythm, normal heart sounds and intact distal pulses.  Pulmonary/Chest: Effort normal and breath sounds normal.  Musculoskeletal: He exhibits tenderness.       Left ankle: Tenderness.       Feet:        Assessment And Plan:      1. Left foot pain  Continues to be painful since his fall approximately 6 weeks ago  Has been treated for cellulitis no longer erythematous  Tender to touch will send for an xray of left foot to evaluate for structural  damage  Limited supply of hydrocodone  - DG Foot Complete Left - ketorolac (TORADOL) injection 60 mg - HYDROcodone-acetaminophen (NORCO/VICODIN) 5-325 MG tablet; Take 1 tablet by mouth every 6 (six) hours as needed for moderate pain.  Dispense: 10 tablet; Refill: 0  2. Dysuria  Moderate blood and large leukocytes in urine   Will send for culture  Treat with Cipro until culture results. - POCT Urinalysis Dipstick (00174) - Culture, Urine       [name], FNP

## 2018-06-19 ENCOUNTER — Telehealth: Payer: Self-pay | Admitting: Nurse Practitioner

## 2018-06-19 ENCOUNTER — Other Ambulatory Visit: Payer: Self-pay | Admitting: Internal Medicine

## 2018-06-19 DIAGNOSIS — R8290 Unspecified abnormal findings in urine: Secondary | ICD-10-CM

## 2018-06-19 MED ORDER — CIPROFLOXACIN HCL 500 MG PO TABS: 500.0000 mg | ORAL_TABLET | Freq: Two times a day (BID) | ORAL | 0 refills | Status: AC

## 2018-06-19 NOTE — Telephone Encounter (Signed)
Sent a message via mychart

## 2018-06-20 ENCOUNTER — Telehealth: Payer: Self-pay | Admitting: Nurse Practitioner

## 2018-06-20 NOTE — Telephone Encounter (Signed)
Called patient to make him aware to pick up his antibiotic and can take AZO for the discomfort. Also he states "he is going to get his foot xray on Monday".

## 2018-06-23 LAB — SPECIMEN STATUS REPORT

## 2018-06-23 LAB — URINE CULTURE

## 2018-06-24 ENCOUNTER — Ambulatory Visit
Admission: RE | Admit: 2018-06-24 | Discharge: 2018-06-24 | Disposition: A | Discharge status: Home / Self Care | Payer: No Typology Code available for payment source | Source: Ambulatory Visit | Attending: Nurse Practitioner | Admitting: Nurse Practitioner

## 2018-06-25 ENCOUNTER — Emergency Department (HOSPITAL_COMMUNITY): Payer: BC Managed Care – PPO

## 2018-06-25 ENCOUNTER — Encounter (HOSPITAL_COMMUNITY): Payer: Self-pay | Admitting: *Deleted

## 2018-06-25 ENCOUNTER — Other Ambulatory Visit: Payer: Self-pay

## 2018-06-25 ENCOUNTER — Emergency Department (HOSPITAL_COMMUNITY)
Admission: EM | Admit: 2018-06-25 | Discharge: 2018-06-25 | Disposition: A | Discharge status: Home / Self Care | Payer: BC Managed Care – PPO | Attending: Emergency Medicine | Admitting: Emergency Medicine

## 2018-06-25 DIAGNOSIS — M545 Low back pain, unspecified: Secondary | ICD-10-CM

## 2018-06-25 DIAGNOSIS — Z79899 Other long term (current) drug therapy: Secondary | ICD-10-CM | POA: Insufficient documentation

## 2018-06-25 DIAGNOSIS — Y929 Unspecified place or not applicable: Secondary | ICD-10-CM | POA: Insufficient documentation

## 2018-06-25 DIAGNOSIS — W01198A Fall on same level from slipping, tripping and stumbling with subsequent striking against other object, initial encounter: Secondary | ICD-10-CM | POA: Insufficient documentation

## 2018-06-25 DIAGNOSIS — F17210 Nicotine dependence, cigarettes, uncomplicated: Secondary | ICD-10-CM | POA: Insufficient documentation

## 2018-06-25 DIAGNOSIS — Y939 Activity, unspecified: Secondary | ICD-10-CM | POA: Insufficient documentation

## 2018-06-25 DIAGNOSIS — Y999 Unspecified external cause status: Secondary | ICD-10-CM | POA: Insufficient documentation

## 2018-06-25 DIAGNOSIS — S161XXA Strain of muscle, fascia and tendon at neck level, initial encounter: Secondary | ICD-10-CM

## 2018-06-25 DIAGNOSIS — W19XXXA Unspecified fall, initial encounter: Secondary | ICD-10-CM

## 2018-06-25 MED ORDER — HYDROCODONE-ACETAMINOPHEN 5-325 MG PO TABS
1.0000 | ORAL_TABLET | Freq: Once | ORAL | Status: AC
  Administered 2018-06-25: 1 via ORAL
  Filled 2018-06-25: qty 1

## 2018-06-25 NOTE — ED Notes (Signed)
Pt ambulated to bathroom with little assistance.

## 2018-06-25 NOTE — Discharge Instructions (Addendum)
Please read instructions below. Apply ice to your areas of pain for 20 minutes at a time. You can take 600 mg of Advil/ibuprofen every 6 hours as needed for pain. Take your tramadol, Robaxin, and gabapentin as prescribed as well for pain. Schedule an appointment with your primary care provider to follow up on your visit today. Return to the ER for severely worsening headache, new vision changes, if new numbness or tingling in your arms or legs, inability to urinate, inability to hold your bowels, or new weakness in your extremities.
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
## 2018-06-25 NOTE — ED Triage Notes (Addendum)
Pt reports a fall in the shower. Pt states that he lost his balance and fell hitting his head. Pt reports head neck and back pain since Monday. Pt states that he had no loss of consciousness. Pt reported some dizziness after the fall

## 2018-06-27 ENCOUNTER — Encounter: Payer: Self-pay | Admitting: Nurse Practitioner

## 2018-06-27 ENCOUNTER — Other Ambulatory Visit: Payer: Self-pay | Admitting: Nurse Practitioner

## 2018-06-27 DIAGNOSIS — N3000 Acute cystitis without hematuria: Secondary | ICD-10-CM

## 2018-06-27 MED ORDER — SULFAMETHOXAZOLE-TRIMETHOPRIM 800-160 MG PO TABS: 1.0000 | ORAL_TABLET | Freq: Two times a day (BID) | ORAL | 0 refills | Status: DC

## 2018-07-11 ENCOUNTER — Other Ambulatory Visit: Payer: Self-pay | Admitting: Nurse Practitioner

## 2018-07-31 ENCOUNTER — Other Ambulatory Visit: Payer: Self-pay | Admitting: Internal Medicine

## 2018-07-31 DIAGNOSIS — G894 Chronic pain syndrome: Secondary | ICD-10-CM

## 2018-08-01 NOTE — Telephone Encounter (Signed)
Robaxin refill.  

## 2018-08-08 IMAGING — DX DG ANKLE COMPLETE 3+V*L*
3 series · 3 of 3 positions shown · non-contrast
Comparison: Operative radiographs obtained earlier this same date.

CLINICAL DATA: Followup fractures of the left tibia and fibula.
Patient underwent operative irrigation and bright mid on 05/03/2017.

EXAM:
LEFT ANKLE COMPLETE - 3+ VIEW

[ankle ap]
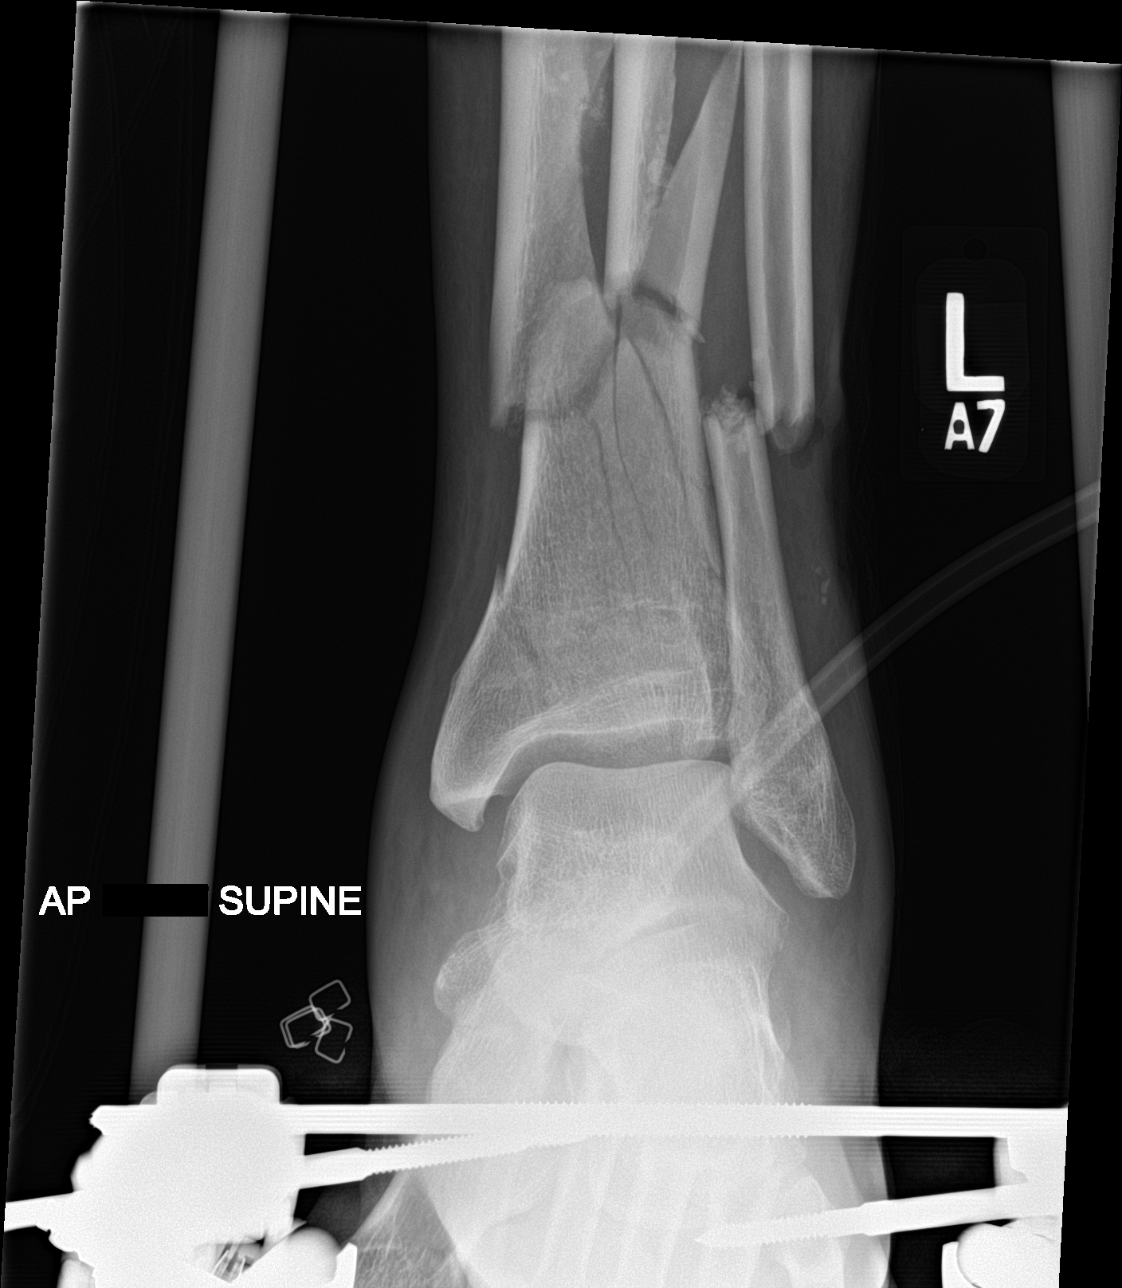

[ankle obl]
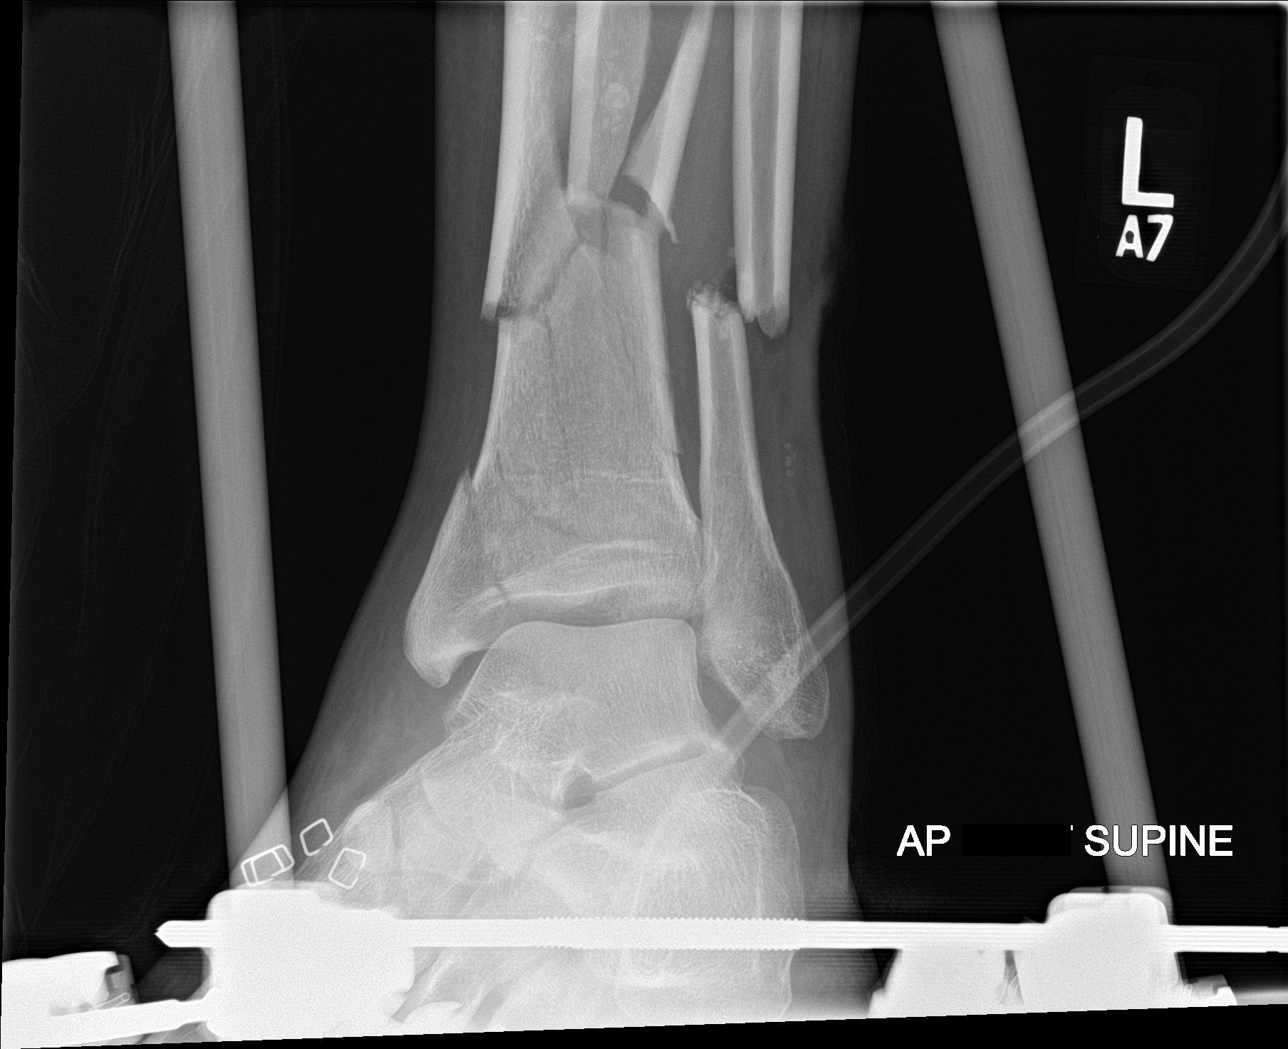

[ankle lat]
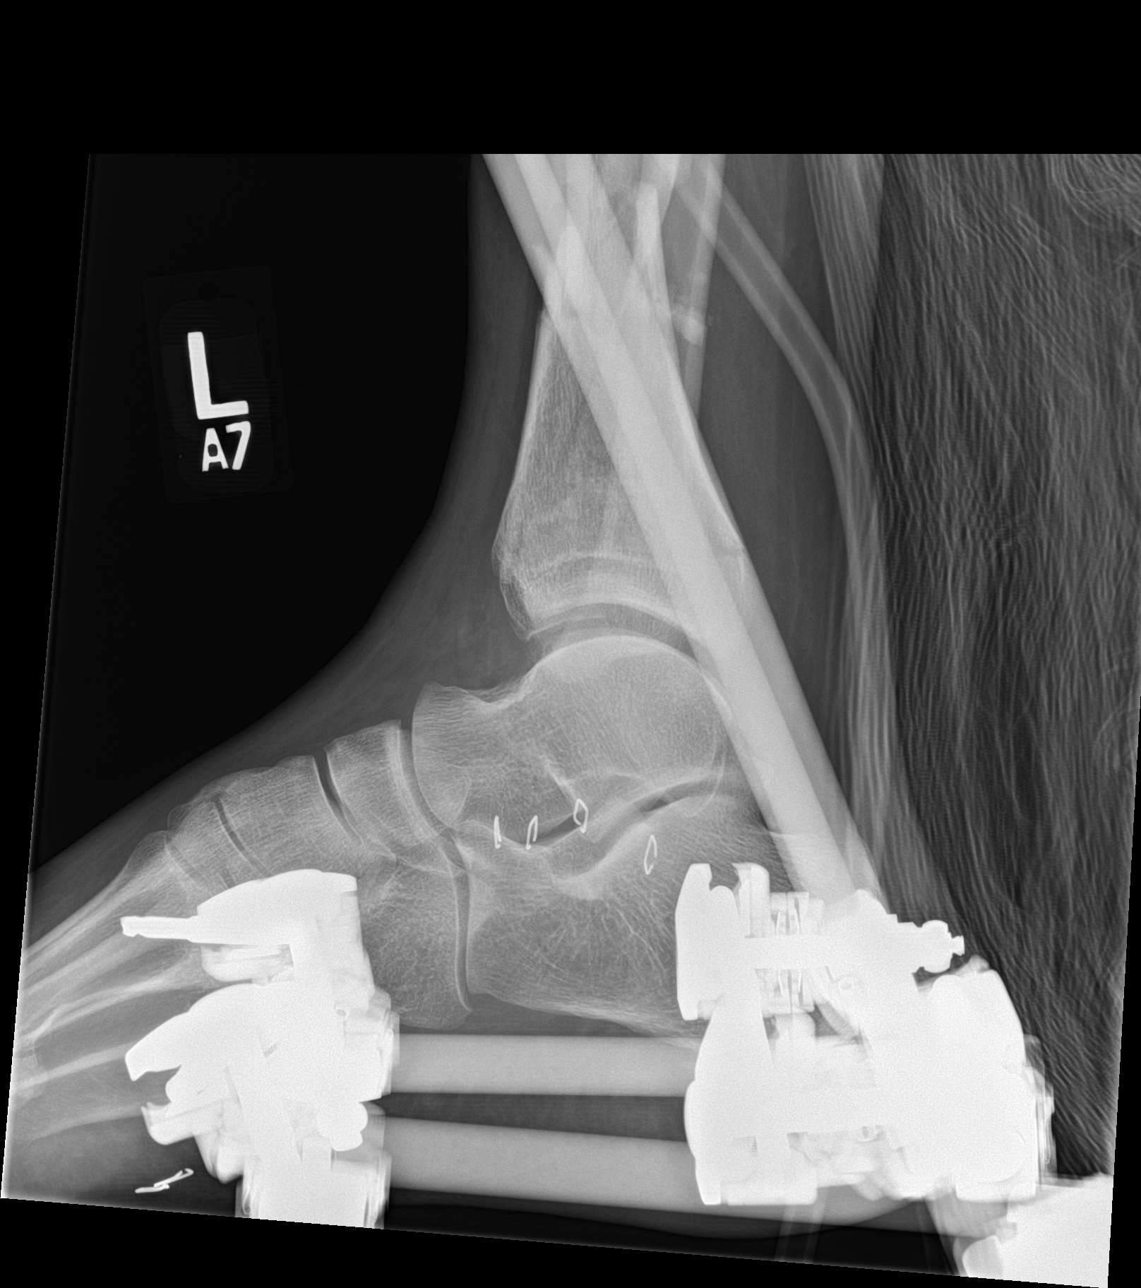

[3 of 3 positions shown; findings below may reference images not displayed]

FINDINGS: The distal aspect of the comminuted fractures of the distal tibia
her noted, which are unchanged compared the operative images
obtained earlier today. The fracture components of the distal tibial
diaphysis show displacement and some angulation, with a maximum
displacement of 18 mm. Nondisplaced oblique sagittal fracture lines
extend through the distal tibia to the epiphysis, with 1 crossing
the base of the medial malleolus to the articular surface of the
ankle joint. The distal fibular fracture is displaced 1 full shaft
width, with the proximal fracture component displaced laterally. The
ankle mortise is normally aligned. Fractures are supported by an
external fixation device.

There are no areas of bone resorption to suggest osteomyelitis.
IMPRESSION: The alignment of the fracture components are unchanged from the
operative images.

## 2018-08-08 IMAGING — RF DG FOOT COMPLETE 3+V*R*
1 series · 4 of 4 positions shown · non-contrast
Comparison: CT right foot dated May 03, 2017.

CLINICAL DATA: Right foot fractures.

EXAM:
RIGHT FOOT COMPLETE - 3+ VIEW

[Series 1: run · 4 of 4 slices shown]
[im 1/4]
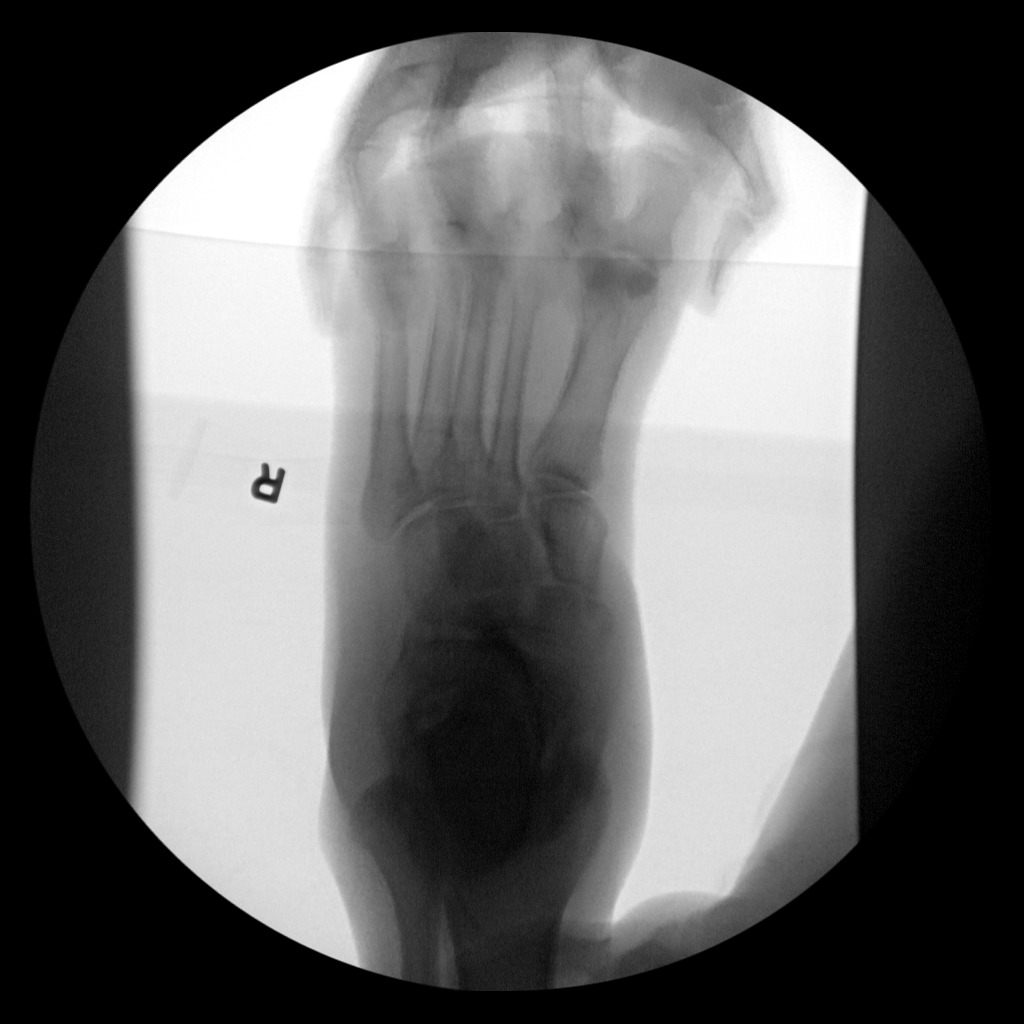
[im 2/4]
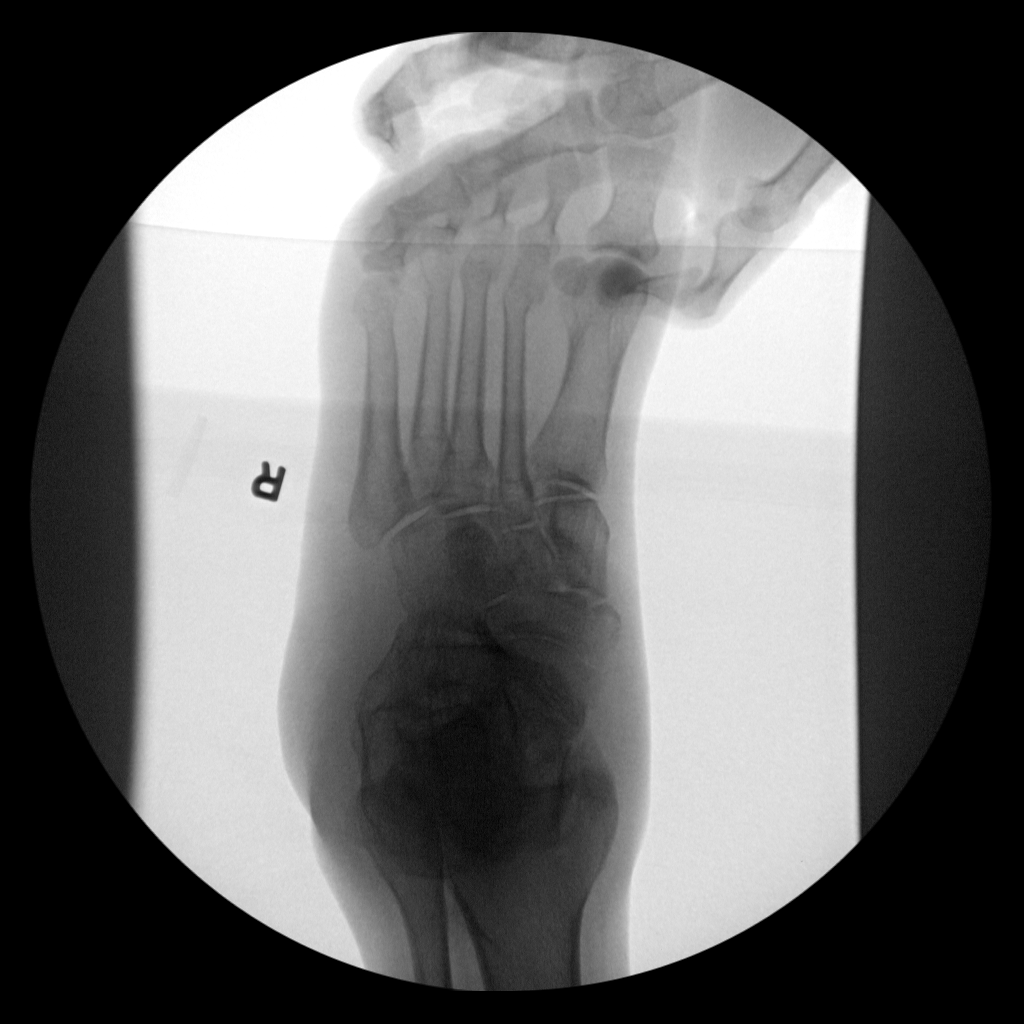
[im 3/4]
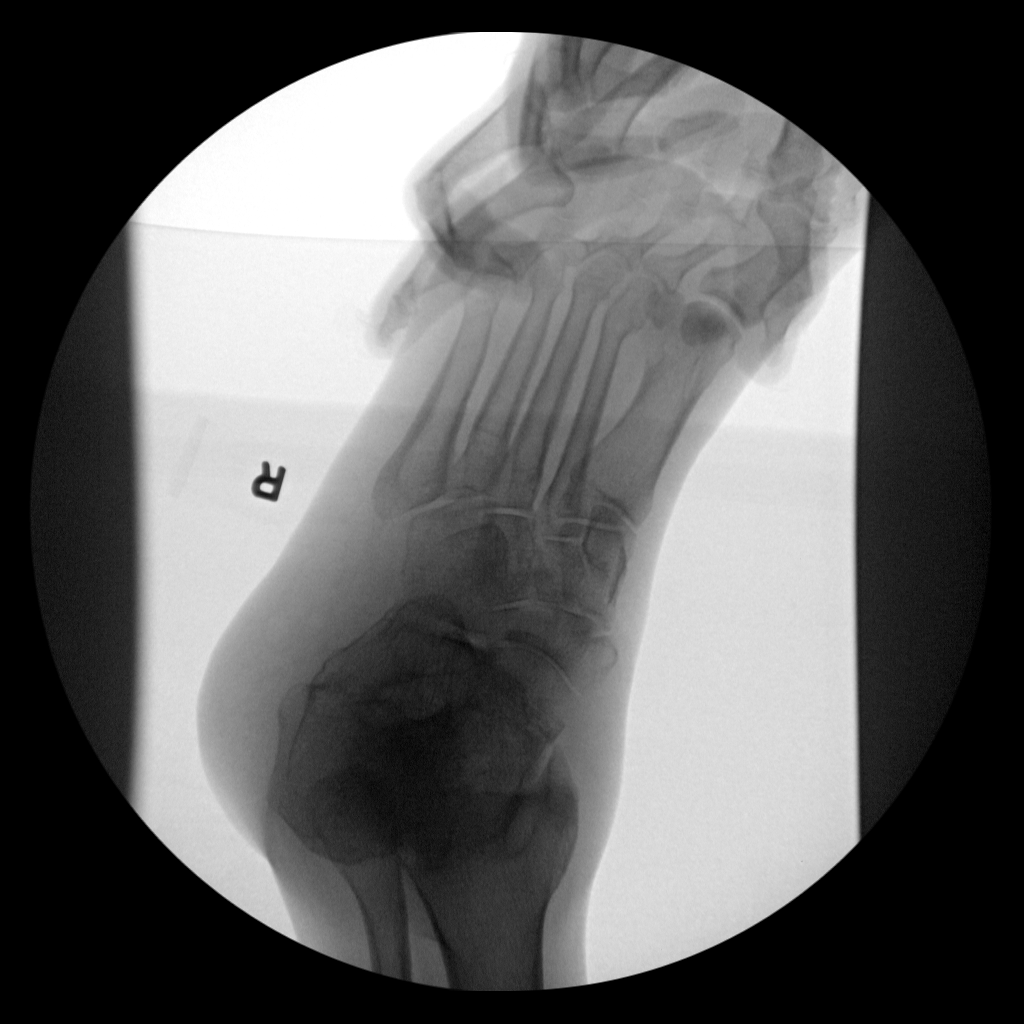
[im 4/4]
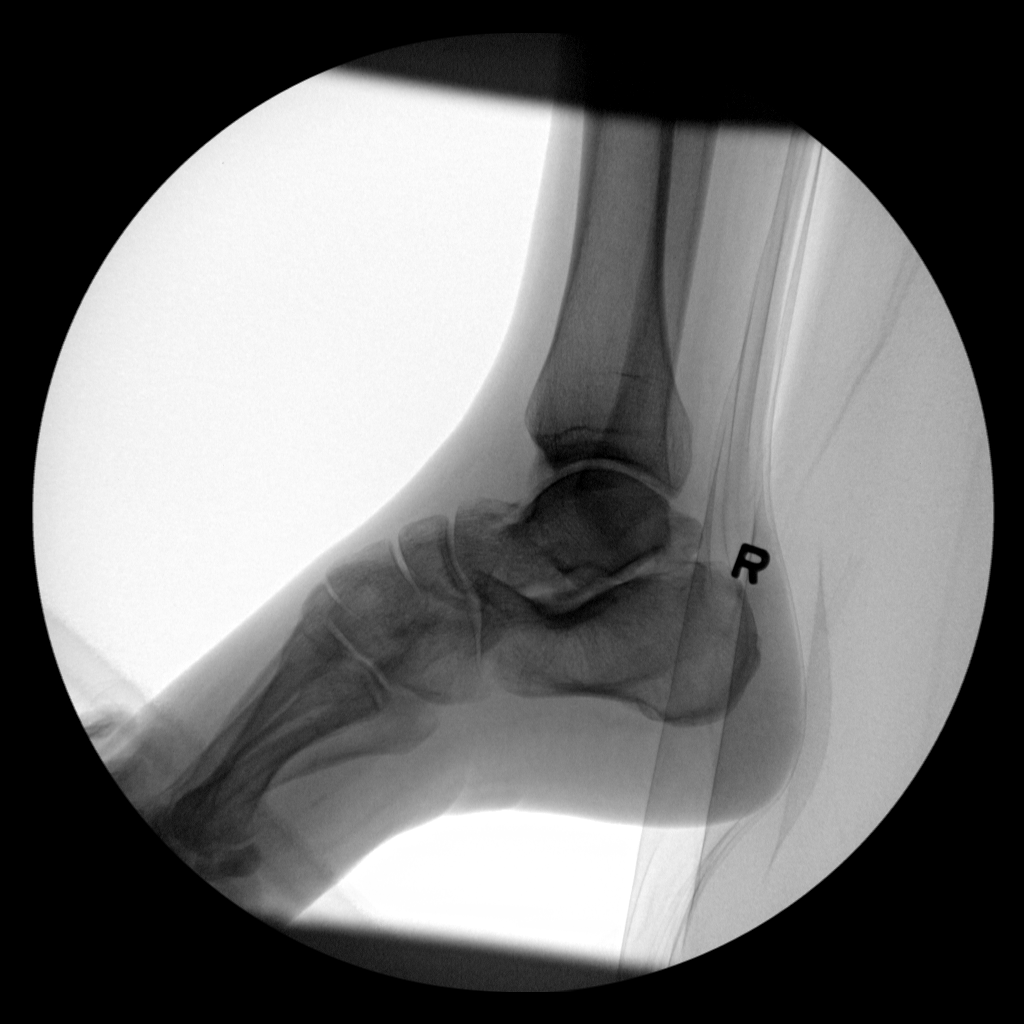

[4 of 4 positions shown; findings below may reference images not displayed]

FLUOROSCOPY TIME:  26 seconds.

C-arm fluoroscopic images were obtained intraoperatively and
submitted for post operative interpretation.
FINDINGS: Nondisplaced fracture of the medial navicular. Nondisplaced
fractures at the bases of the cuneiforms and second metatarsal are
better evaluated on prior CT. Lisfranc alignment appears maintained.

Unchanged fracture of the talar neck. Age-indeterminate impaction
fracture of the calcaneal body. There are minimally displaced
fractures through the second and third metatarsal heads.
IMPRESSION: 1. Multiple tarsal bone fractures appear grossly unchanged in
alignment. The fractures at the bases of the cuneiforms and second
metatarsal are better evaluated on prior CT. Lisfranc alignment
appears maintained.
2. Unchanged talar neck fracture with mild dorsal displacement of
the distal fragment.
3. Age-indeterminate impaction fracture of the calcaneal body with
flattening of the calcaneus and slight widening of the posterior
subtalar joint, unchanged.
4. Unchanged minimally displaced fractures through the second and
third metatarsal heads.

## 2018-08-08 IMAGING — RF DG C-ARM 61-120 MIN
1 series · 2 of 2 positions shown · non-contrast
Comparison: 05/03/2017

CLINICAL DATA: Left tibial debridement with adjustment of external
fixator

EXAM:
DG C-ARM 61-120 MIN; LEFT ANKLE - 2 VIEW

[Series 1: run · 2 of 2 slices shown]
[im 1/2]
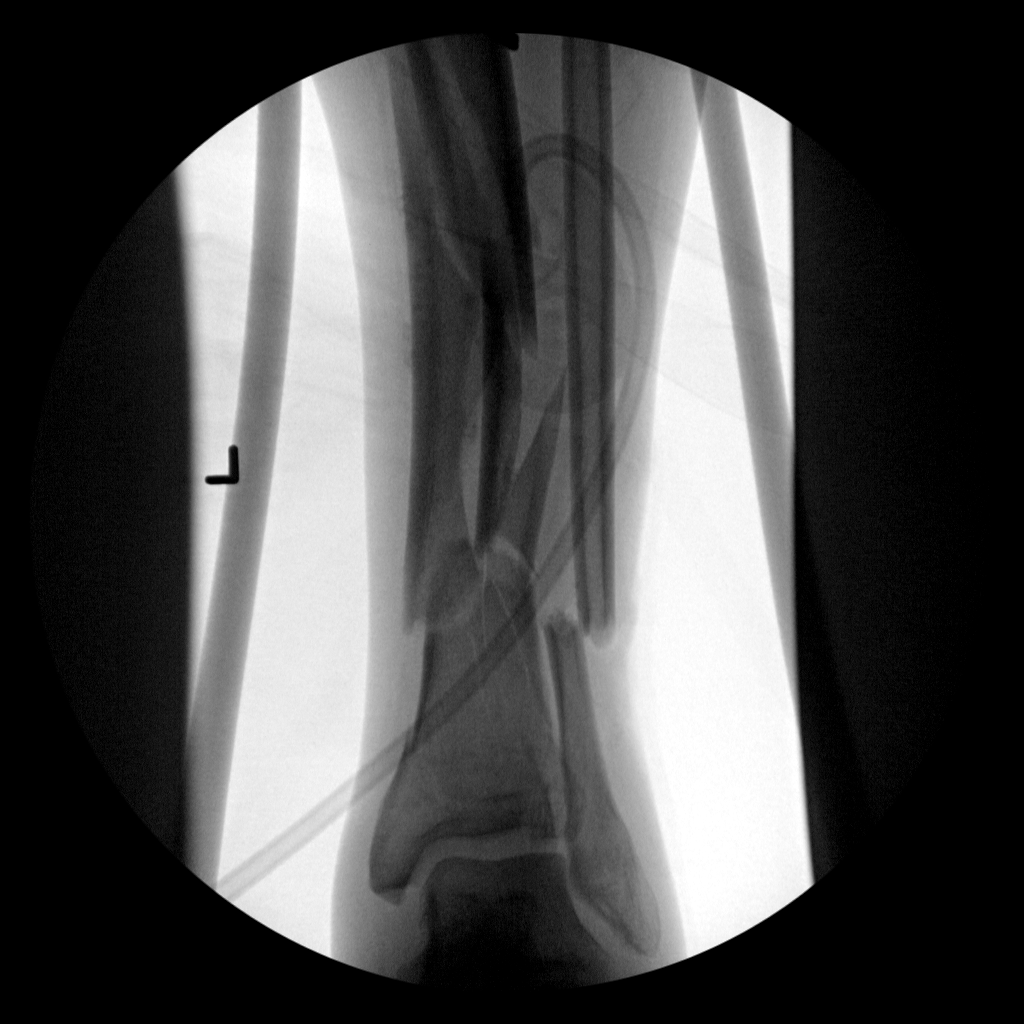
[im 2/2]
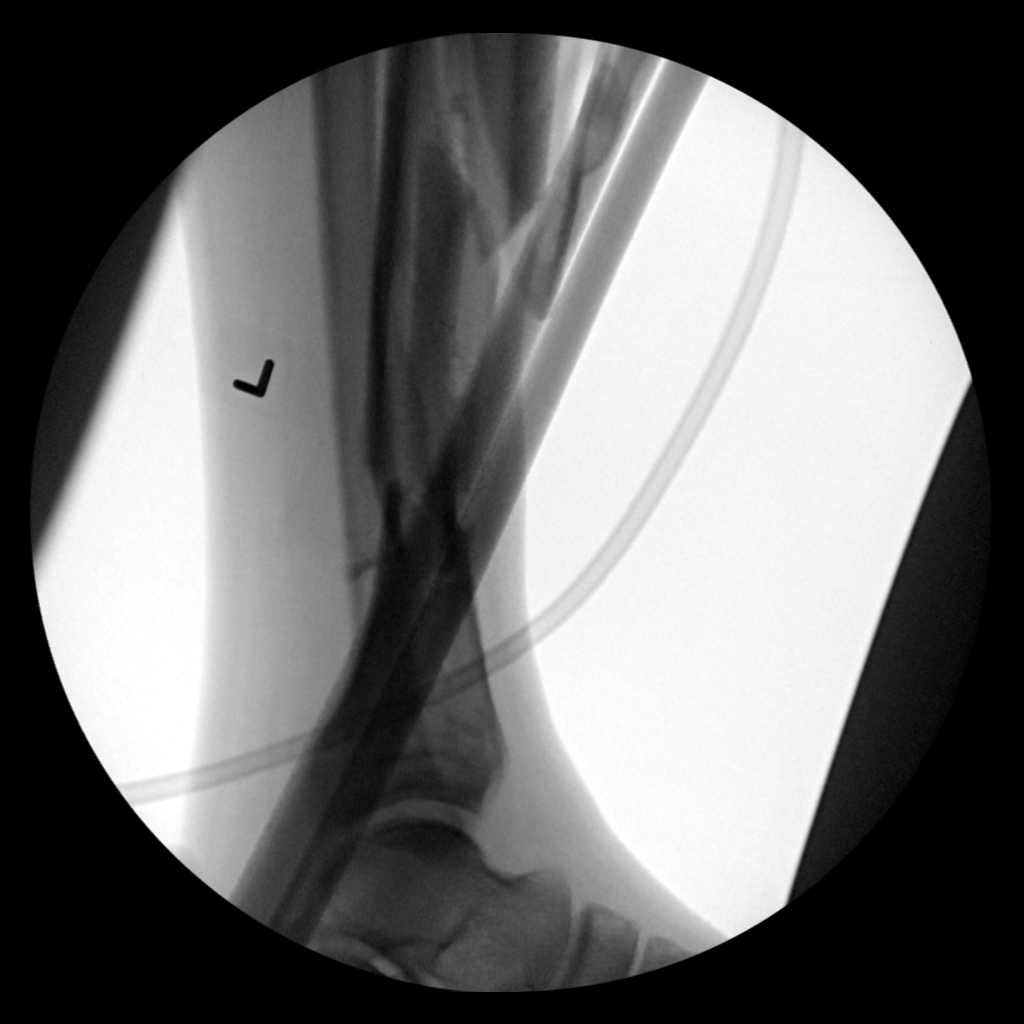

[2 of 2 positions shown; findings below may reference images not displayed]

FLUOROSCOPY TIME:  Fluoroscopy Time:  51 seconds

Radiation Exposure Index (if provided by the fluoroscopic device):
Not available

Number of Acquired Spot Images: 2
FINDINGS: Severely comminuted tibial fracture is again identified. Fibular
fracture is noted as well. Previously seen radiopaque beads have
been removed in the interval.
IMPRESSION: Status post debridement of tibial fracture.

## 2018-08-11 IMAGING — DX DG CHEST 1V PORT
1 series · 1 of 1 positions shown · non-contrast
Comparison: Chest x-ray dated May 08, 2017.

CLINICAL DATA: Respiratory failure.

EXAM:
PORTABLE CHEST 1 VIEW

[chest]
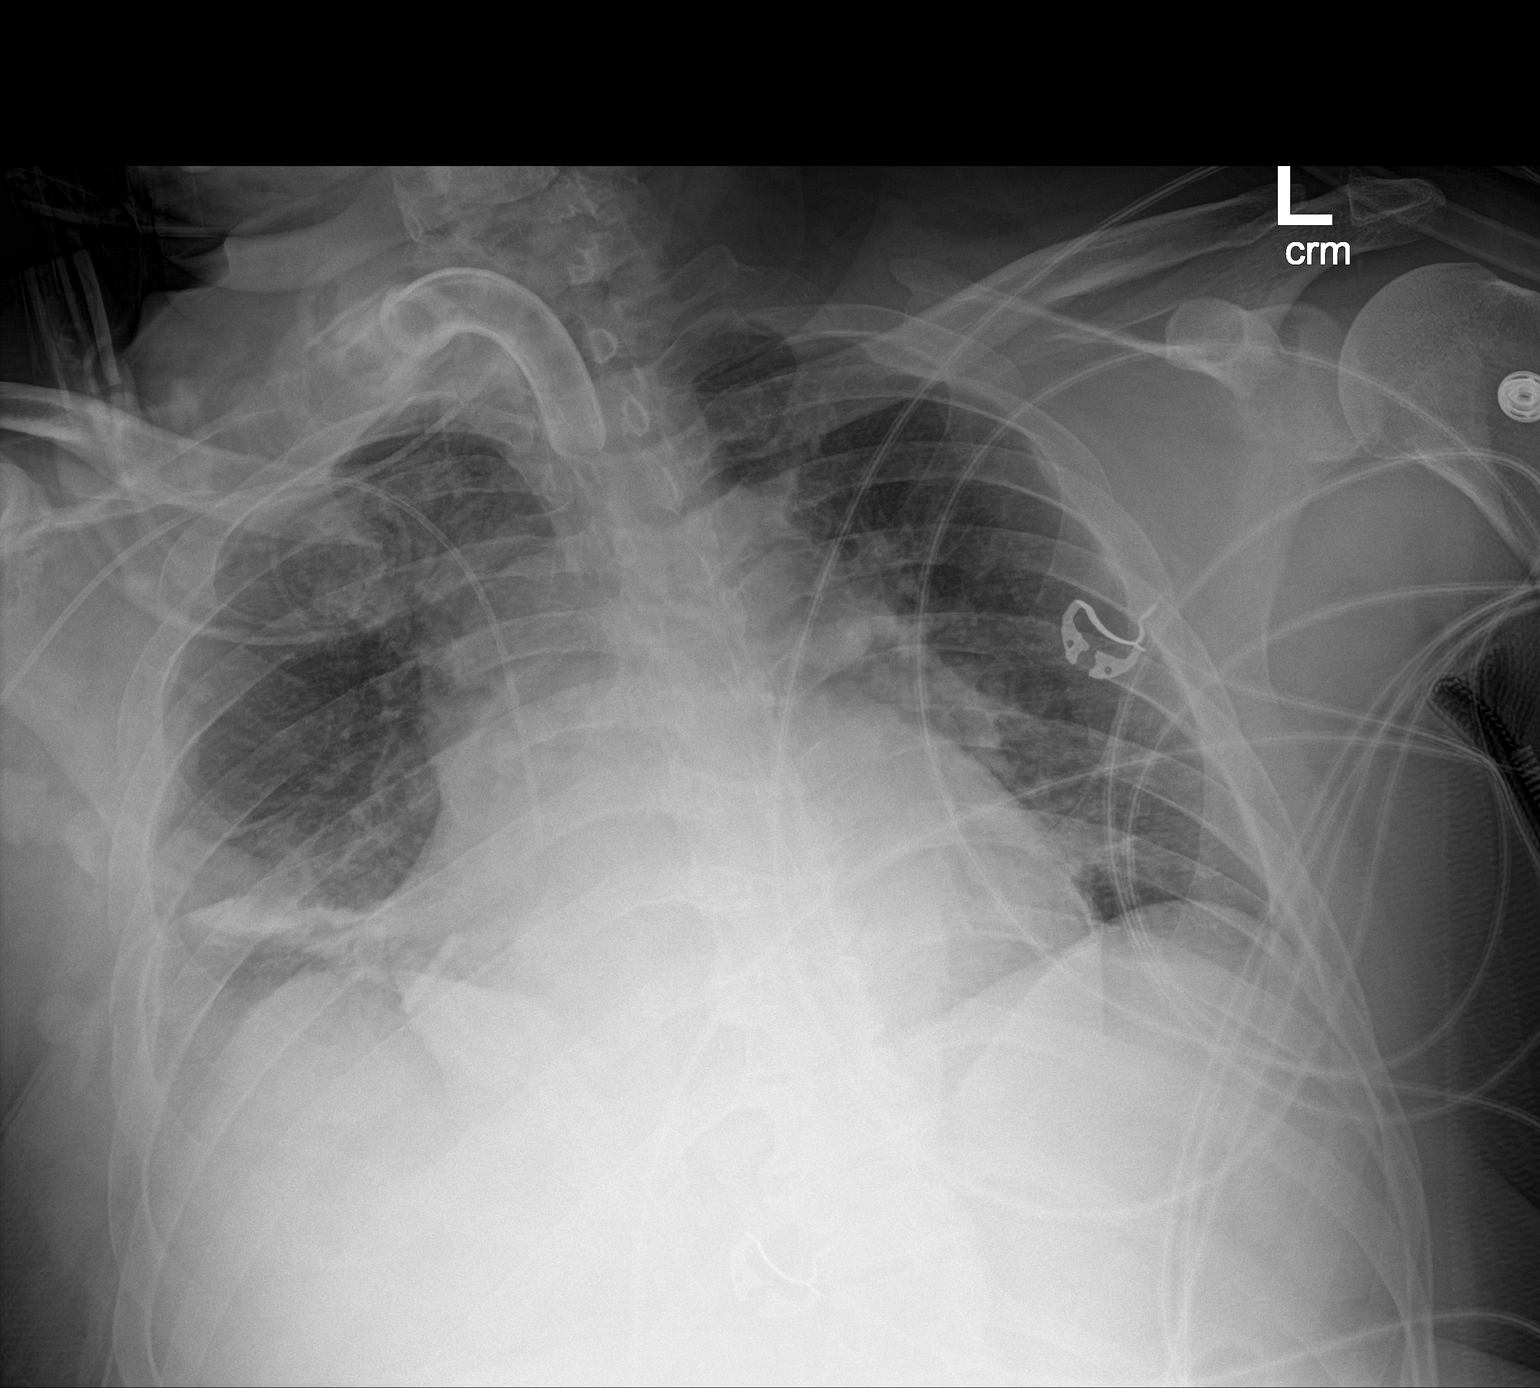

[1 of 1 positions shown; findings below may reference images not displayed]

FINDINGS: Tracheostomy to in good position with the tip approximately 3.7 cm
above the level of the carina. Interval insertion of a right-sided
PICC catheter with the tip projecting over the cavoatrial junction.
The cardiomediastinal silhouette remains mildly enlarged. Mild
pulmonary vascular congestion again noted. Unchanged small right
pleural lesion with fluid in the minor fissure. Low lung volumes
with improved aeration of the right lung base. No pneumothorax. No
acute osseous abnormality.
IMPRESSION: 1. Tracheostomy tube remains in good position.
2. Unchanged small right pleural effusion. Improved aeration of the
right lung base.

## 2018-10-26 ENCOUNTER — Other Ambulatory Visit: Payer: Self-pay | Admitting: Nurse Practitioner

## 2018-10-26 DIAGNOSIS — G894 Chronic pain syndrome: Secondary | ICD-10-CM

## 2018-11-22 ENCOUNTER — Other Ambulatory Visit: Payer: Self-pay | Admitting: Physical Medicine & Rehabilitation

## 2019-01-03 ENCOUNTER — Other Ambulatory Visit: Payer: Self-pay | Admitting: Nurse Practitioner

## 2019-01-03 DIAGNOSIS — G8250 Quadriplegia, unspecified: Secondary | ICD-10-CM

## 2019-01-03 DIAGNOSIS — G894 Chronic pain syndrome: Secondary | ICD-10-CM

## 2019-01-03 DIAGNOSIS — M542 Cervicalgia: Secondary | ICD-10-CM

## 2019-04-01 ENCOUNTER — Other Ambulatory Visit: Payer: Self-pay

## 2019-04-01 ENCOUNTER — Encounter: Payer: Self-pay | Admitting: Internal Medicine

## 2019-04-01 ENCOUNTER — Ambulatory Visit (INDEPENDENT_AMBULATORY_CARE_PROVIDER_SITE_OTHER): Payer: Self-pay | Admitting: Internal Medicine

## 2019-04-01 VITALS — BP 108/74 | HR 71 | Temp 98.0°F | Ht 65.0 in

## 2019-04-01 DIAGNOSIS — N3000 Acute cystitis without hematuria: Secondary | ICD-10-CM

## 2019-04-01 DIAGNOSIS — G894 Chronic pain syndrome: Secondary | ICD-10-CM

## 2019-04-01 DIAGNOSIS — N319 Neuromuscular dysfunction of bladder, unspecified: Secondary | ICD-10-CM

## 2019-04-01 DIAGNOSIS — F331 Major depressive disorder, recurrent, moderate: Secondary | ICD-10-CM

## 2019-04-01 DIAGNOSIS — Z79899 Other long term (current) drug therapy: Secondary | ICD-10-CM

## 2019-04-01 LAB — POCT URINALYSIS DIPSTICK
Bilirubin, UA: NEGATIVE
Glucose, UA: NEGATIVE
Ketones, UA: NEGATIVE
Nitrite, UA: NEGATIVE
Protein, UA: POSITIVE — AB
Spec Grav, UA: 1.02 (ref 1.010–1.025)
Urobilinogen, UA: 1 E.U./dL
pH, UA: 8.5 — AB (ref 5.0–8.0)

## 2019-04-01 MED ORDER — SULFAMETHOXAZOLE-TRIMETHOPRIM 800-160 MG PO TABS: 1.0000 | ORAL_TABLET | Freq: Two times a day (BID) | ORAL | 0 refills | Status: DC

## 2019-04-01 MED ORDER — CEFTRIAXONE SODIUM 500 MG IJ SOLR
500.0000 mg | Freq: Once | INTRAMUSCULAR | Status: AC
  Administered 2019-04-01: 500 mg via INTRAMUSCULAR

## 2019-04-01 NOTE — Patient Instructions (Signed)
Urinary Tract Infection, Adult A urinary tract infection (UTI) is an infection of any part of the urinary tract. The urinary tract includes:  The kidneys.  The ureters.  The bladder.  The urethra. These organs make, store, and get rid of pee (urine) in the body. What are the causes? This is caused by germs (bacteria) in your genital area. These germs grow and cause swelling (inflammation) of your urinary tract. What increases the risk? You are more likely to develop this condition if:  You have a small, thin tube (catheter) to drain pee.  You cannot control when you pee or poop (incontinence).  You are male, and: ? You use these methods to prevent pregnancy: ? A medicine that kills sperm (spermicide). ? A device that blocks sperm (diaphragm). ? You have low levels of a male hormone (estrogen). ? You are pregnant.  You have genes that add to your risk.  You are sexually active.  You take antibiotic medicines.  You have trouble peeing because of: ? A prostate that is bigger than normal, if you are male. ? A blockage in the part of your body that drains pee from the bladder (urethra). ? A kidney stone. ? A nerve condition that affects your bladder (neurogenic bladder). ? Not getting enough to drink. ? Not peeing often enough.  You have other conditions, such as: ? Diabetes. ? A weak disease-fighting system (immune system). ? Sickle cell disease. ? Gout. ? Injury of the spine. What are the signs or symptoms? Symptoms of this condition include:  Needing to pee right away (urgently).  Peeing often.  Peeing small amounts often.  Pain or burning when peeing.  Blood in the pee.  Pee that smells bad or not like normal.  Trouble peeing.  Pee that is cloudy.  Fluid coming from the vagina, if you are male.  Pain in the belly or lower back. Other symptoms include:  Throwing up (vomiting).  No urge to eat.  Feeling mixed up (confused).  Being tired  and grouchy (irritable).  A fever.  Watery poop (diarrhea). How is this treated? This condition may be treated with:  Antibiotic medicine.  Other medicines.  Drinking enough water. Follow these instructions at home:  Medicines  Take over-the-counter and prescription medicines only as told by your doctor.  If you were prescribed an antibiotic medicine, take it as told by your doctor. Do not stop taking it even if you start to feel better. General instructions  Make sure you: ? Pee until your bladder is empty. ? Do not hold pee for a long time. ? Empty your bladder after sex. ? Wipe from front to back after pooping if you are a male. Use each tissue one time when you wipe.  Drink enough fluid to keep your pee pale yellow.  Keep all follow-up visits as told by your doctor. This is important. Contact a doctor if:  You do not get better after 1-2 days.  Your symptoms go away and then come back. Get help right away if:  You have very bad back pain.  You have very bad pain in your lower belly.  You have a fever.  You are sick to your stomach (nauseous).  You are throwing up. Summary  A urinary tract infection (UTI) is an infection of any part of the urinary tract.  This condition is caused by germs in your genital area.  There are many risk factors for a UTI. These include having a small, thin   tube to drain pee and not being able to control when you pee or poop.  Treatment includes antibiotic medicines for germs.  Drink enough fluid to keep your pee pale yellow. This information is not intended to replace advice given to you by your health care provider. Make sure you discuss any questions you have with your health care provider. Document Released: 02/13/2008 Document Revised: 08/14/2018 Document Reviewed: 03/06/2018 Elsevier Patient Education  2020 Elsevier Inc.  

## 2019-04-02 LAB — CMP14+EGFR
ALT: 21 IU/L (ref 0–44)
AST: 21 IU/L (ref 0–40)
Albumin/Globulin Ratio: 1.5 (ref 1.2–2.2)
Albumin: 4.3 g/dL (ref 3.8–4.9)
Alkaline Phosphatase: 89 IU/L (ref 39–117)
BUN/Creatinine Ratio: 11 (ref 9–20)
BUN: 8 mg/dL (ref 6–24)
Bilirubin Total: 0.4 mg/dL (ref 0.0–1.2)
CO2: 24 mmol/L (ref 20–29)
Calcium: 9.7 mg/dL (ref 8.7–10.2)
Chloride: 103 mmol/L (ref 96–106)
Creatinine, Ser: 0.75 mg/dL — ABNORMAL LOW (ref 0.76–1.27)
GFR calc Af Amer: 123 mL/min/{1.73_m2} (ref >59)
GFR calc non Af Amer: 106 mL/min/{1.73_m2} (ref >59)
Globulin, Total: 2.8 g/dL (ref 1.5–4.5)
Glucose: 116 mg/dL — ABNORMAL HIGH (ref 65–99)
Potassium: 4.9 mmol/L (ref 3.5–5.2)
Sodium: 141 mmol/L (ref 134–144)
Total Protein: 7.1 g/dL (ref 6.0–8.5)

## 2019-04-02 LAB — CBC WITH DIFFERENTIAL/PLATELET
Basophils Absolute: 0 10*3/uL (ref 0.0–0.2)
Basos: 1 %
EOS (ABSOLUTE): 0.2 10*3/uL (ref 0.0–0.4)
Eos: 4 %
Hematocrit: 44.4 % (ref 37.5–51.0)
Hemoglobin: 14.2 g/dL (ref 13.0–17.7)
Immature Grans (Abs): 0 10*3/uL (ref 0.0–0.1)
Immature Granulocytes: 0 %
Lymphocytes Absolute: 2.2 10*3/uL (ref 0.7–3.1)
Lymphs: 42 %
MCH: 26.4 pg — ABNORMAL LOW (ref 26.6–33.0)
MCHC: 32 g/dL (ref 31.5–35.7)
MCV: 83 fL (ref 79–97)
Monocytes Absolute: 0.5 10*3/uL (ref 0.1–0.9)
Monocytes: 10 %
Neutrophils Absolute: 2.2 10*3/uL (ref 1.4–7.0)
Neutrophils: 43 %
Platelets: 236 10*3/uL (ref 150–450)
RBC: 5.38 x10E6/uL (ref 4.14–5.80)
RDW: 15.5 % — ABNORMAL HIGH (ref 11.6–15.4)
WBC: 5.1 10*3/uL (ref 3.4–10.8)

## 2019-04-05 NOTE — Progress Notes (Signed)
Subjective:     Patient ID: Vincent Mems Sr. , male    DOB: 11-06-1965 , 53 y.o.   MRN: [id number]   Chief Complaint  Patient presents with  . Urinary Tract Infection  . Edema    HPI  He is here today for evaluation of possible UTI. He is accompanied by his wife.  She brought him here because she feels he has a UTI. He has not been able to urinate. Therefore, she placed a Foley catheter. He does not have fever/chills. Has h/o bladder issues. Has been followed by Urology in the past.  Urinary Tract Infection  This is a recurrent problem. The problem occurs intermittently. The pain is at a severity of 0/10. The patient is experiencing no pain. There has been no fever. Associated symptoms include hesitancy and urgency.     Past Medical History:  Diagnosis Date  . Chronic back pain   . History of suicidal ideation 01/2017   with overdose/ [facility] admission  . Lumbago with sciatica    left side  . Vitamin D deficiency      Family History  Problem Relation Age of Onset  . Hypothyroidism Mother   . Hypertension Father      Current Outpatient Medications:  .  bethanechol (URECHOLINE) 25 MG tablet, TAKE 1 TABLET BY MOUTH THREE TIMES A DAY, Disp: 90 tablet, Rfl: 0 .  diclofenac sodium (VOLTAREN) 1 % GEL, Apply 2 g 4 (four) times daily topically., Disp: 4 Tube, Rfl: 0 .  docusate sodium (COLACE) 50 MG capsule, Take 50 mg by mouth 2 (two) times daily., Disp: , Rfl:  .  gabapentin (NEURONTIN) 600 MG tablet, Take 1 tablet (600 mg total) by mouth 3 (three) times daily., Disp: 90 tablet, Rfl: 3 .  HYDROcodone-acetaminophen (NORCO/VICODIN) 5-325 MG tablet, Take 1 tablet by mouth every 6 (six) hours as needed for moderate pain., Disp: 10 tablet, Rfl: 0 .  lidocaine (LIDODERM) 5 %, Place 1 patch onto the skin daily. Remove & Discard patch within 12 hours or as directed by MD, Disp: 30 patch, Rfl: 0 .  Magnesium Oxide 250 MG TABS, TAKE 1 TABLET BY MOUTH IN THE EVENING, Disp: 30 tablet,  Rfl: 5 .  meloxicam (MOBIC) 7.5 MG tablet, TAKE 1 TABLET BY MOUTH EVERY DAY, Disp: 30 tablet, Rfl: 3 .  methocarbamol (ROBAXIN) 500 MG tablet, Take 500 mg by mouth 3 (three) times daily., Disp: , Rfl:  .  methocarbamol (ROBAXIN) 500 MG tablet, TAKE 1 TABLET BY MOUTH EVERY 8 HOURS AS NEEDED FOR MUSCLE SPASMS, Disp: 30 tablet, Rfl: 0 .  naphazoline-glycerin (CLEAR EYES) 0.012-0.2 % SOLN, Place 2 drops 4 (four) times daily -  with meals and at bedtime into both eyes., Disp: 30 mL, Rfl: 0 .  QUEtiapine (SEROQUEL) 50 MG tablet, TAKE 1 TABLET BY MOUTH TWICE A DAY, Disp: 60 tablet, Rfl: 0 .  sertraline (ZOLOFT) 50 MG tablet, Take 50 mg by mouth daily., Disp: , Rfl:  .  sulfamethoxazole-trimethoprim (BACTRIM DS) 800-160 MG tablet, Take 1 tablet by mouth 2 (two) times daily., Disp: 20 tablet, Rfl: 0 .  tamsulosin (FLOMAX) 0.4 MG CAPS capsule, TAKE 1 CAPSULE BY MOUTH EVERY DAY, Disp: 90 capsule, Rfl: 1   No Known Allergies   Review of Systems  Constitutional: Negative.   Respiratory: Negative.   Cardiovascular: Negative.   Gastrointestinal: Negative.   Genitourinary: Positive for hesitancy and urgency.  Neurological: Negative.   Psychiatric/Behavioral: Negative.      Today's  Vitals   04/01/19 0933  BP: 108/74  Pulse: 71  Temp: 98 F (36.7 C)  TempSrc: Oral  Height: '5\' 5"'  (1.651 m)   Body mass index is 39.27 kg/m.   Objective:  Physical Exam Vitals signs and nursing note reviewed.  Constitutional:      Appearance: Normal appearance. He is obese.  Cardiovascular:     Rate and Rhythm: Normal rate and regular rhythm.     Heart sounds: Normal heart sounds.  Pulmonary:     Effort: Pulmonary effort is normal.     Breath sounds: Normal breath sounds.  Genitourinary:    Comments: Foley in place Skin:    General: Skin is warm.  Neurological:     Mental Status: He is alert.     Comments: He is in wheelchair  Psychiatric:        Mood and Affect: Mood normal.         Assessment And  Plan:     1. Acute cystitis without hematuria  I will check urine culture. He was given rocephin, 553m Im x1 and rx Bactrim. He is encouraged to take full abx course.   - Culture, Urine - cefTRIAXone (ROCEPHIN) injection 500 mg - CBC with Diff - sulfamethoxazole-trimethoprim (BACTRIM DS) 800-160 MG tablet; Take 1 tablet by mouth 2 (two) times daily.  Dispense: 20 tablet; Refill: 0  2. Chronic pain syndrome  Chronic. Sx due to TBI.   3. Moderate episode of recurrent major depressive disorder (HCC)  Chronic. He will continue with current meds for now.   4. Neurogenic bladder  Chronic. I will refer him to Urology for further evaluation.   - Ambulatory referral to Urology - POCT Urinalysis Dipstick (81002)  5. Drug therapy  - CMP14+EGFR   RMaximino Greenland MD    THE PATIENT IS ENCOURAGED TO PRACTICE SOCIAL DISTANCING DUE TO THE COVID-19 PANDEMIC.

## 2019-04-06 LAB — URINE CULTURE

## 2019-07-01 ENCOUNTER — Ambulatory Visit: Payer: Self-pay | Admitting: Nurse Practitioner

## 2019-07-01 ENCOUNTER — Other Ambulatory Visit: Payer: Self-pay

## 2019-07-01 ENCOUNTER — Encounter: Payer: Self-pay | Admitting: Nurse Practitioner

## 2019-07-01 VITALS — BP 126/84 | HR 72 | Temp 98.2°F | Ht 64.4 in | Wt 234.2 lb

## 2019-07-01 DIAGNOSIS — S069X3S Unspecified intracranial injury 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, sequela: Secondary | ICD-10-CM

## 2019-07-01 DIAGNOSIS — Z23 Encounter for immunization: Secondary | ICD-10-CM

## 2019-07-01 DIAGNOSIS — N3001 Acute cystitis with hematuria: Secondary | ICD-10-CM

## 2019-07-01 DIAGNOSIS — G8250 Quadriplegia, unspecified: Secondary | ICD-10-CM

## 2019-07-01 DIAGNOSIS — L03116 Cellulitis of left lower limb: Secondary | ICD-10-CM

## 2019-07-01 LAB — POCT URINALYSIS DIPSTICK
Bilirubin, UA: NEGATIVE
Blood, UA: NEGATIVE
Glucose, UA: NEGATIVE
Nitrite, UA: POSITIVE
Protein, UA: POSITIVE — AB
Spec Grav, UA: 1.025 (ref 1.010–1.025)
Urobilinogen, UA: 1 E.U./dL
pH, UA: 6.5 (ref 5.0–8.0)

## 2019-07-01 MED ORDER — NITROFURANTOIN MONOHYD MACRO 100 MG PO CAPS: 100.0000 mg | ORAL_CAPSULE | Freq: Two times a day (BID) | ORAL | 0 refills | Status: DC

## 2019-07-01 MED ORDER — CEPHALEXIN 500 MG PO CAPS: 500.0000 mg | ORAL_CAPSULE | Freq: Four times a day (QID) | ORAL | 0 refills | Status: AC

## 2019-07-01 NOTE — Patient Instructions (Addendum)
Try to set up a private pay account through [employer] by calling [phone]. You can also purchase them from [facility] discount medical supply on [address] [place] [phone] or [employer] [facility] in [place] [phone] or [employer] [facility] in [place] [phone]    Cellulitis, Adult  Cellulitis is a skin infection. The infected area is often warm, red, swollen, and sore. It occurs most often in the arms and lower legs. It is very important to get treated for this condition. What are the causes? This condition is caused by bacteria. The bacteria enter through a break in the skin, such as a cut, burn, insect bite, open sore, or crack. What increases the risk? This condition is more likely to occur in people who:  Have a weak body defense system (immune system).  Have open cuts, burns, bites, or scrapes on the skin.  Are older than 52 years of age.  Have a blood sugar problem (diabetes).  Have a long-lasting (chronic) liver disease (cirrhosis) or kidney disease.  Are very overweight (obese).  Have a skin problem, such as: ? Itchy rash (eczema). ? Slow movement of blood in the veins (venous stasis). ? Fluid buildup below the skin (edema).  Have been treated with high-energy rays (radiation).  Use IV drugs. What are the signs or symptoms? Symptoms of this condition include:  Skin that is: ? Red. ? Streaking. ? Spotting. ? Swollen. ? Sore or painful when you touch it. ? Warm.  A fever.  Chills.  Blisters. How is this diagnosed? This condition is diagnosed based on:  Medical history.  Physical exam.  Blood tests.  Imaging tests. How is this treated? Treatment for this condition may include:  Medicines to treat infections or allergies.  Home care, such as: ? Rest. ? Placing cold or warm cloths (compresses) on the skin.  Hospital care, if the condition is very bad. Follow these instructions at home: Medicines  Take over-the-counter and  prescription medicines only as told by your doctor.  If you were prescribed an antibiotic medicine, take it as told by your doctor. Do not stop taking it even if you start to feel better. General instructions   Drink enough fluid to keep your pee (urine) pale yellow.  Do not touch or rub the infected area.  Raise (elevate) the infected area above the level of your heart while you are sitting or lying down.  Place cold or warm cloths on the area as told by your doctor.  Keep all follow-up visits as told by your doctor. This is important. Contact a doctor if:  You have a fever.  You do not start to get better after 1-2 days of treatment.  Your bone or joint under the infected area starts to hurt after the skin has healed.  Your infection comes back. This can happen in the same area or another area.  You have a swollen bump in the area.  You have new symptoms.  You feel ill and have muscle aches and pains. Get help right away if:  Your symptoms get worse.  You feel very sleepy.  You throw up (vomit) or have watery poop (diarrhea) for a long time.  You see red streaks coming from the area.  Your red area gets larger.  Your red area turns dark in color. These symptoms may represent a serious problem that is an emergency. Do not wait to see if the symptoms will go away. Get medical help right away. Call your local emergency  services (911 in the [place] Do not drive yourself to the hospital. Summary  Cellulitis is a skin infection. The area is often warm, red, swollen, and sore.  This condition is treated with medicines, rest, and cold and warm cloths.  Take all medicines only as told by your doctor.  Tell your doctor if symptoms do not start to get better after 1-2 days of treatment. This information is not intended to replace advice given to you by your health care provider. Make sure you discuss any questions you have with your health care provider. Document Released:  02/13/2008 Document Revised: 01/16/2018 Document Reviewed: 01/16/2018 Elsevier Patient Education  2020 [employer].  Urinary Tract Infection, Adult A urinary tract infection (UTI) is an infection of any part of the urinary tract. The urinary tract includes:  The kidneys.  The ureters.  The bladder.  The urethra. These organs make, store, and get rid of pee (urine) in the body. What are the causes? This is caused by germs (bacteria) in your genital area. These germs grow and cause swelling (inflammation) of your urinary tract. What increases the risk? You are more likely to develop this condition if:  You have a small, thin tube (catheter) to drain pee.  You cannot control when you pee or poop (incontinence).  You are male, and: ? You use these methods to prevent pregnancy: ? A medicine that kills sperm (spermicide). ? A device that blocks sperm (diaphragm). ? You have low levels of a male hormone (estrogen). ? You are pregnant.  You have genes that add to your risk.  You are sexually active.  You take antibiotic medicines.  You have trouble peeing because of: ? A prostate that is bigger than normal, if you are male. ? A blockage in the part of your body that drains pee from the bladder (urethra). ? A kidney stone. ? A nerve condition that affects your bladder (neurogenic bladder). ? Not getting enough to drink. ? Not peeing often enough.  You have other conditions, such as: ? Diabetes. ? A weak disease-fighting system (immune system). ? Sickle cell disease. ? Gout. ? Injury of the spine. What are the signs or symptoms? Symptoms of this condition include:  Needing to pee right away (urgently).  Peeing often.  Peeing small amounts often.  Pain or burning when peeing.  Blood in the pee.  Pee that smells bad or not like normal.  Trouble peeing.  Pee that is cloudy.  Fluid coming from the vagina, if you are male.  Pain in the belly or lower  back. Other symptoms include:  Throwing up (vomiting).  No urge to eat.  Feeling mixed up (confused).  Being tired and grouchy (irritable).  A fever.  Watery poop (diarrhea). How is this treated? This condition may be treated with:  Antibiotic medicine.  Other medicines.  Drinking enough water. Follow these instructions at home:  Medicines  Take over-the-counter and prescription medicines only as told by your doctor.  If you were prescribed an antibiotic medicine, take it as told by your doctor. Do not stop taking it even if you start to feel better. General instructions  Make sure you: ? Pee until your bladder is empty. ? Do not hold pee for a long time. ? Empty your bladder after sex. ? Wipe from front to back after pooping if you are a male. Use each tissue one time when you wipe.  Drink enough fluid to keep your pee pale yellow.  Keep all  follow-up visits as told by your doctor. This is important. Contact a doctor if:  You do not get better after 1-2 days.  Your symptoms go away and then come back. Get help right away if:  You have very bad back pain.  You have very bad pain in your lower belly.  You have a fever.  You are sick to your stomach (nauseous).  You are throwing up. Summary  A urinary tract infection (UTI) is an infection of any part of the urinary tract.  This condition is caused by germs in your genital area.  There are many risk factors for a UTI. These include having a small, thin tube to drain pee and not being able to control when you pee or poop.  Treatment includes antibiotic medicines for germs.  Drink enough fluid to keep your pee pale yellow. This information is not intended to replace advice given to you by your health care provider. Make sure you discuss any questions you have with your health care provider. Document Released: 02/13/2008 Document Revised: 08/14/2018 Document Reviewed: 03/06/2018 Elsevier Patient  Education  2020 [employer].

## 2019-07-01 NOTE — Progress Notes (Signed)
Subjective:     Patient ID: Vincent Mems Sr. , male    DOB: 1966/07/20 , 53 y.o.   MRN: [id number]   Chief Complaint  Patient presents with  . Dysuria    patient stated it burns when he urinates he stated his symptoms started a month ago he has tried to take AZO and flomax  . Leg Pain    patient stated he has a sore on his left leg. he stated it is real sore and tender.    HPI  Dysuria  This is a recurrent problem. The current episode started 1 to 4 weeks ago. The problem occurs intermittently. The quality of the pain is described as burning. The pain is mild. He is not sexually active. There is no history of pyelonephritis. Associated symptoms include frequency, hesitancy and urgency. Pertinent negatives include no chills or discharge. He has tried nothing for the symptoms. There is no history of catheterization.  Leg Pain      Past Medical History:  Diagnosis Date  . Chronic back pain   . History of suicidal ideation 01/2017   with overdose/ [facility] admission  . Lumbago with sciatica    left side  . Vitamin D deficiency      Family History  Problem Relation Age of Onset  . Hypothyroidism Mother   . Hypertension Father      Current Outpatient Medications:  .  bethanechol (URECHOLINE) 25 MG tablet, TAKE 1 TABLET BY MOUTH THREE TIMES A DAY, Disp: 90 tablet, Rfl: 0 .  diclofenac sodium (VOLTAREN) 1 % GEL, Apply 2 g 4 (four) times daily topically., Disp: 4 Tube, Rfl: 0 .  docusate sodium (COLACE) 50 MG capsule, Take 50 mg by mouth 2 (two) times daily., Disp: , Rfl:  .  gabapentin (NEURONTIN) 600 MG tablet, Take 1 tablet (600 mg total) by mouth 3 (three) times daily., Disp: 90 tablet, Rfl: 3 .  HYDROcodone-acetaminophen (NORCO/VICODIN) 5-325 MG tablet, Take 1 tablet by mouth every 6 (six) hours as needed for moderate pain., Disp: 10 tablet, Rfl: 0 .  lidocaine (LIDODERM) 5 %, Place 1 patch onto the skin daily. Remove & Discard patch within 12 hours or as directed by MD,  Disp: 30 patch, Rfl: 0 .  Magnesium Oxide 250 MG TABS, TAKE 1 TABLET BY MOUTH IN THE EVENING, Disp: 30 tablet, Rfl: 5 .  meloxicam (MOBIC) 7.5 MG tablet, TAKE 1 TABLET BY MOUTH EVERY DAY, Disp: 30 tablet, Rfl: 3 .  methocarbamol (ROBAXIN) 500 MG tablet, Take 500 mg by mouth 3 (three) times daily., Disp: , Rfl:  .  methocarbamol (ROBAXIN) 500 MG tablet, TAKE 1 TABLET BY MOUTH EVERY 8 HOURS AS NEEDED FOR MUSCLE SPASMS, Disp: 30 tablet, Rfl: 0 .  naphazoline-glycerin (CLEAR EYES) 0.012-0.2 % SOLN, Place 2 drops 4 (four) times daily -  with meals and at bedtime into both eyes., Disp: 30 mL, Rfl: 0 .  QUEtiapine (SEROQUEL) 50 MG tablet, TAKE 1 TABLET BY MOUTH TWICE A DAY, Disp: 60 tablet, Rfl: 0 .  sertraline (ZOLOFT) 50 MG tablet, Take 50 mg by mouth daily., Disp: , Rfl:  .  tamsulosin (FLOMAX) 0.4 MG CAPS capsule, TAKE 1 CAPSULE BY MOUTH EVERY DAY, Disp: 90 capsule, Rfl: 1 .  sulfamethoxazole-trimethoprim (BACTRIM DS) 800-160 MG tablet, Take 1 tablet by mouth 2 (two) times daily. (Patient not taking: Reported on 07/01/2019), Disp: 20 tablet, Rfl: 0   No Known Allergies   Review of Systems  Constitutional: Negative for chills.  Genitourinary: Positive for dysuria, frequency, hesitancy and urgency.  Skin: Positive for wound (left leg redness).  Neurological: Negative.     Today's Vitals   07/01/19 1058  BP: 126/84  Pulse: 72  Temp: 98.2 F (36.8 C)  TempSrc: Oral  Weight: 234 lb 3.2 oz (106.2 kg)  Height: 5' 4.4" (1.636 m)  PainSc: 6   PainLoc: Leg   Body mass index is 39.7 kg/m.   Objective:  Physical Exam Constitutional:      General: He is not in acute distress.    Appearance: Normal appearance. He is obese.  Cardiovascular:     Rate and Rhythm: Normal rate and regular rhythm.     Pulses: Normal pulses.     Heart sounds: Normal heart sounds. No murmur.  Pulmonary:     Effort: Pulmonary effort is normal. No respiratory distress.     Breath sounds: Normal breath sounds.   Skin:    General: Skin is warm.     Capillary Refill: Capillary refill takes less than 2 seconds.     Comments: Left lower extremity with reddened and slightly hot to touch to anterior shin with slight open area.    Neurological:     General: No focal deficit present.     Mental Status: He is alert and oriented to person, place, and time.         Assessment And Plan:     1. Cellulitis of left lower extremity Erythematous and hot to touch anterior left shin with slight open area. Continue to cleanse with dial soap and water and apply neosporin.  Will also treat with cephalexin - cephALEXin (KEFLEX) 500 MG capsule; Take 1 capsule (500 mg total) by mouth 4 (four) times daily for 10 days.  Dispense: 40 capsule; Refill: 0  2. Acute cystitis with hematuria  Positive nitrates to urine with moderate blood. Will treat with cephalexin encouraged to increase water intake  He uses an in and out catheter and has been using the same one for several months and will just clean with soap and water.    He would benefit from having more in and out catheters available. - cephALEXin (KEFLEX) 500 MG capsule; Take 1 capsule (500 mg total) by mouth 4 (four) times daily for 10 days.  Dispense: 40 capsule; Refill: 0 - POCT Urinalysis Dipstick (81002) - Culture, Urine  3. Need for influenza vaccination  Influenza vaccine given in office  Advised to take Tylenol as needed for muscle aches or fever - Flu Vaccine QUAD 6+ mos PF IM (Fluarix Quad PF)   [name], FNP    THE PATIENT IS ENCOURAGED TO PRACTICE SOCIAL DISTANCING DUE TO THE COVID-19 PANDEMIC.

## 2019-07-05 LAB — URINE CULTURE

## 2019-07-06 ENCOUNTER — Encounter: Payer: Self-pay | Admitting: Nurse Practitioner

## 2019-07-06 ENCOUNTER — Telehealth: Payer: Self-pay

## 2019-07-06 NOTE — Telephone Encounter (Signed)
Pt called stating you suggested him to get in and out catheters. He spoke with someone at [facility] supply, was told he would need prescription for in and out catheter. He asked if you can fax it over to [facility] supply on [address]

## 2019-09-23 ENCOUNTER — Telehealth: Payer: Self-pay

## 2019-09-23 NOTE — Telephone Encounter (Signed)
Patient's wife called stating she would like for [place] to send the pt some antibotics to be sent to the pharmacy for a UTI she stated she feels he has been there In a good enough timeframe that he can just get meds and if not can they schedule an appt.  I RETURNED HER CALL AND I ADVISED HER HE HAS NOT BEEN SEEN SINCE October SO HE WOULD NEED TO MAKE AN APPT. APPT HAS BEEN SCHEDULED. YRL,RMA

## 2019-09-24 ENCOUNTER — Other Ambulatory Visit: Payer: Self-pay

## 2019-09-24 ENCOUNTER — Ambulatory Visit (INDEPENDENT_AMBULATORY_CARE_PROVIDER_SITE_OTHER): Payer: Self-pay | Admitting: Nurse Practitioner

## 2019-09-24 ENCOUNTER — Encounter: Payer: Self-pay | Admitting: Nurse Practitioner

## 2019-09-24 VITALS — BP 118/78 | HR 86 | Temp 98.5°F | Ht 64.4 in | Wt 238.0 lb

## 2019-09-24 DIAGNOSIS — G8929 Other chronic pain: Secondary | ICD-10-CM

## 2019-09-24 DIAGNOSIS — M542 Cervicalgia: Secondary | ICD-10-CM

## 2019-09-24 DIAGNOSIS — N390 Urinary tract infection, site not specified: Secondary | ICD-10-CM

## 2019-09-24 DIAGNOSIS — R300 Dysuria: Secondary | ICD-10-CM

## 2019-09-24 LAB — POCT URINALYSIS DIPSTICK
Bilirubin, UA: NEGATIVE
Glucose, UA: NEGATIVE
Ketones, UA: NEGATIVE
Nitrite, UA: POSITIVE
Protein, UA: POSITIVE — AB
Spec Grav, UA: 1.02 (ref 1.010–1.025)
Urobilinogen, UA: 1 E.U./dL
pH, UA: 7 (ref 5.0–8.0)

## 2019-09-24 MED ORDER — KETOROLAC TROMETHAMINE 30 MG/ML IJ SOLN
30.0000 mg | Freq: Once | INTRAMUSCULAR | Status: AC
  Administered 2019-09-24: 16:00:00 30 mg via INTRAMUSCULAR

## 2019-09-24 MED ORDER — NITROFURANTOIN MONOHYD MACRO 100 MG PO CAPS: 100.0000 mg | ORAL_CAPSULE | Freq: Two times a day (BID) | ORAL | 0 refills | Status: AC

## 2019-09-24 MED ORDER — HYDROCODONE-ACETAMINOPHEN 5-325 MG PO TABS: 1.0000 | ORAL_TABLET | Freq: Four times a day (QID) | ORAL | 0 refills | Status: DC | PRN
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
## 2019-09-28 LAB — URINE CULTURE

## 2019-09-28 LAB — DRUG PROFILE, UR, 9 DRUGS (LABCORP)
Amphetamines, Urine: NEGATIVE ng/mL
Barbiturate Quant, Ur: NEGATIVE ng/mL
Benzodiazepine Quant, Ur: NEGATIVE ng/mL
Cannabinoid Quant, Ur: POSITIVE — AB
Cocaine (Metab.): NEGATIVE ng/mL
Methadone Screen, Urine: NEGATIVE ng/mL
Opiate Quant, Ur: NEGATIVE ng/mL
PCP Quant, Ur: NEGATIVE ng/mL
Propoxyphene: NEGATIVE ng/mL

## 2019-09-30 ENCOUNTER — Telehealth: Payer: Self-pay

## 2019-09-30 NOTE — Telephone Encounter (Signed)
Patient notified of drug screen results. YRL,RMA

## 2019-11-09 DIAGNOSIS — R339 Retention of urine, unspecified: Secondary | ICD-10-CM | POA: Diagnosis not present

## 2019-11-23 ENCOUNTER — Other Ambulatory Visit: Payer: Self-pay

## 2019-11-23 DIAGNOSIS — G8250 Quadriplegia, unspecified: Secondary | ICD-10-CM

## 2019-11-23 DIAGNOSIS — M542 Cervicalgia: Secondary | ICD-10-CM

## 2019-11-23 DIAGNOSIS — G894 Chronic pain syndrome: Secondary | ICD-10-CM

## 2019-11-23 MED ORDER — GABAPENTIN 600 MG PO TABS: 600.0000 mg | ORAL_TABLET | Freq: Three times a day (TID) | ORAL | 3 refills | Status: DC

## 2019-11-23 MED ORDER — TAMSULOSIN HCL 0.4 MG PO CAPS: 0.4000 mg | ORAL_CAPSULE | Freq: Every day | ORAL | 1 refills | Status: DC

## 2019-11-23 MED ORDER — MELOXICAM 7.5 MG PO TABS: 7.5000 mg | ORAL_TABLET | Freq: Every day | ORAL | 1 refills | Status: DC

## 2019-11-24 ENCOUNTER — Other Ambulatory Visit: Payer: Self-pay

## 2019-11-27 DIAGNOSIS — M542 Cervicalgia: Secondary | ICD-10-CM | POA: Diagnosis not present

## 2019-11-27 DIAGNOSIS — M79605 Pain in left leg: Secondary | ICD-10-CM | POA: Diagnosis not present

## 2019-11-27 DIAGNOSIS — Z79899 Other long term (current) drug therapy: Secondary | ICD-10-CM | POA: Diagnosis not present

## 2019-11-27 DIAGNOSIS — E669 Obesity, unspecified: Secondary | ICD-10-CM | POA: Diagnosis not present

## 2019-12-02 DIAGNOSIS — H40023 Open angle with borderline findings, high risk, bilateral: Secondary | ICD-10-CM | POA: Diagnosis not present

## 2019-12-02 DIAGNOSIS — H2513 Age-related nuclear cataract, bilateral: Secondary | ICD-10-CM | POA: Diagnosis not present

## 2019-12-10 ENCOUNTER — Other Ambulatory Visit: Payer: Self-pay

## 2019-12-10 DIAGNOSIS — G894 Chronic pain syndrome: Secondary | ICD-10-CM

## 2019-12-10 DIAGNOSIS — R300 Dysuria: Secondary | ICD-10-CM | POA: Diagnosis not present

## 2019-12-10 DIAGNOSIS — N3942 Incontinence without sensory awareness: Secondary | ICD-10-CM | POA: Diagnosis not present

## 2019-12-10 MED ORDER — MAGNESIUM OXIDE 250 MG PO TABS: 1.0000 | ORAL_TABLET | Freq: Every evening | ORAL | 1 refills | Status: DC

## 2019-12-10 MED ORDER — METHOCARBAMOL 500 MG PO TABS: ORAL_TABLET | ORAL | 0 refills | Status: DC

## 2019-12-14 DIAGNOSIS — M542 Cervicalgia: Secondary | ICD-10-CM | POA: Diagnosis not present

## 2019-12-14 DIAGNOSIS — M545 Low back pain: Secondary | ICD-10-CM | POA: Diagnosis not present

## 2019-12-14 DIAGNOSIS — M25551 Pain in right hip: Secondary | ICD-10-CM | POA: Diagnosis not present

## 2019-12-17 ENCOUNTER — Telehealth: Payer: Self-pay

## 2019-12-17 NOTE — Telephone Encounter (Signed)
Spoke w/pt wife  [name] we sent the prescription already to [facility] they already have his prescription so he should be able to get the wheelchair

## 2019-12-30 ENCOUNTER — Encounter: Payer: Medicare HMO | Attending: Physical Medicine & Rehabilitation | Admitting: Physical Medicine & Rehabilitation

## 2019-12-31 DIAGNOSIS — E669 Obesity, unspecified: Secondary | ICD-10-CM | POA: Diagnosis not present

## 2019-12-31 DIAGNOSIS — M542 Cervicalgia: Secondary | ICD-10-CM | POA: Diagnosis not present

## 2019-12-31 DIAGNOSIS — Z79899 Other long term (current) drug therapy: Secondary | ICD-10-CM | POA: Diagnosis not present

## 2019-12-31 DIAGNOSIS — M79605 Pain in left leg: Secondary | ICD-10-CM | POA: Diagnosis not present

## 2019-12-31 DIAGNOSIS — M545 Low back pain: Secondary | ICD-10-CM | POA: Diagnosis not present

## 2020-01-04 ENCOUNTER — Telehealth: Payer: Self-pay

## 2020-01-04 DIAGNOSIS — R8279 Other abnormal findings on microbiological examination of urine: Secondary | ICD-10-CM | POA: Diagnosis not present

## 2020-01-04 DIAGNOSIS — N3000 Acute cystitis without hematuria: Secondary | ICD-10-CM | POA: Diagnosis not present

## 2020-01-04 DIAGNOSIS — E119 Type 2 diabetes mellitus without complications: Secondary | ICD-10-CM | POA: Diagnosis not present

## 2020-01-04 NOTE — Telephone Encounter (Signed)
Patient's wife [name] called stating patient has urinary symptoms and they would either like an appt or Rx to be sent to the pharmacy she was advised to call his urologist per [name] DNP,FNP-BC. [name]

## 2020-01-05 DIAGNOSIS — H40021 Open angle with borderline findings, high risk, right eye: Secondary | ICD-10-CM | POA: Diagnosis not present

## 2020-01-05 DIAGNOSIS — H401123 Primary open-angle glaucoma, left eye, severe stage: Secondary | ICD-10-CM | POA: Diagnosis not present

## 2020-01-05 DIAGNOSIS — H2513 Age-related nuclear cataract, bilateral: Secondary | ICD-10-CM | POA: Diagnosis not present

## 2020-01-27 ENCOUNTER — Other Ambulatory Visit: Payer: Self-pay | Admitting: Nurse Practitioner

## 2020-01-28 DIAGNOSIS — M542 Cervicalgia: Secondary | ICD-10-CM | POA: Diagnosis not present

## 2020-01-28 DIAGNOSIS — E669 Obesity, unspecified: Secondary | ICD-10-CM | POA: Diagnosis not present

## 2020-01-28 DIAGNOSIS — M79605 Pain in left leg: Secondary | ICD-10-CM | POA: Diagnosis not present

## 2020-01-28 DIAGNOSIS — Z79899 Other long term (current) drug therapy: Secondary | ICD-10-CM | POA: Diagnosis not present

## 2020-02-01 DIAGNOSIS — R1084 Generalized abdominal pain: Secondary | ICD-10-CM | POA: Diagnosis not present

## 2020-02-01 DIAGNOSIS — R300 Dysuria: Secondary | ICD-10-CM | POA: Diagnosis not present

## 2020-02-01 DIAGNOSIS — R350 Frequency of micturition: Secondary | ICD-10-CM | POA: Diagnosis not present

## 2020-02-03 ENCOUNTER — Encounter: Payer: Medicare HMO | Attending: Physical Medicine & Rehabilitation | Admitting: Physical Medicine & Rehabilitation

## 2020-02-11 DIAGNOSIS — R8271 Bacteriuria: Secondary | ICD-10-CM | POA: Diagnosis not present

## 2020-02-11 DIAGNOSIS — R262 Difficulty in walking, not elsewhere classified: Secondary | ICD-10-CM | POA: Diagnosis not present

## 2020-02-11 DIAGNOSIS — N3000 Acute cystitis without hematuria: Secondary | ICD-10-CM | POA: Diagnosis not present

## 2020-02-22 ENCOUNTER — Other Ambulatory Visit: Payer: Self-pay

## 2020-02-22 MED ORDER — MELOXICAM 7.5 MG PO TABS: 7.5000 mg | ORAL_TABLET | Freq: Every day | ORAL | 1 refills | Status: DC

## 2020-02-22 MED ORDER — TAMSULOSIN HCL 0.4 MG PO CAPS: 0.4000 mg | ORAL_CAPSULE | Freq: Every day | ORAL | 1 refills | Status: DC

## 2020-02-26 DIAGNOSIS — Z79899 Other long term (current) drug therapy: Secondary | ICD-10-CM | POA: Diagnosis not present

## 2020-02-26 DIAGNOSIS — M542 Cervicalgia: Secondary | ICD-10-CM | POA: Diagnosis not present

## 2020-02-26 DIAGNOSIS — M79605 Pain in left leg: Secondary | ICD-10-CM | POA: Diagnosis not present

## 2020-02-26 DIAGNOSIS — E669 Obesity, unspecified: Secondary | ICD-10-CM | POA: Diagnosis not present

## 2020-03-09 DIAGNOSIS — N210 Calculus in bladder: Secondary | ICD-10-CM | POA: Diagnosis not present

## 2020-03-11 DIAGNOSIS — N310 Uninhibited neuropathic bladder, not elsewhere classified: Secondary | ICD-10-CM | POA: Diagnosis not present

## 2020-03-16 ENCOUNTER — Encounter: Payer: Self-pay | Admitting: Nurse Practitioner

## 2020-03-16 ENCOUNTER — Ambulatory Visit (INDEPENDENT_AMBULATORY_CARE_PROVIDER_SITE_OTHER): Payer: Medicare HMO | Admitting: Nurse Practitioner

## 2020-03-16 ENCOUNTER — Other Ambulatory Visit: Payer: Self-pay

## 2020-03-16 VITALS — BP 124/76 | HR 74 | Temp 98.4°F | Ht 64.4 in | Wt 240.0 lb

## 2020-03-16 DIAGNOSIS — Z79899 Other long term (current) drug therapy: Secondary | ICD-10-CM | POA: Diagnosis not present

## 2020-03-16 DIAGNOSIS — G8250 Quadriplegia, unspecified: Secondary | ICD-10-CM | POA: Diagnosis not present

## 2020-03-16 DIAGNOSIS — R2681 Unsteadiness on feet: Secondary | ICD-10-CM | POA: Diagnosis not present

## 2020-03-16 DIAGNOSIS — Z6841 Body Mass Index (BMI) 40.0 and over, adult: Secondary | ICD-10-CM | POA: Diagnosis not present

## 2020-03-16 DIAGNOSIS — R300 Dysuria: Secondary | ICD-10-CM | POA: Diagnosis not present

## 2020-03-16 DIAGNOSIS — N3000 Acute cystitis without hematuria: Secondary | ICD-10-CM | POA: Diagnosis not present

## 2020-03-16 DIAGNOSIS — R269 Unspecified abnormalities of gait and mobility: Secondary | ICD-10-CM | POA: Diagnosis not present

## 2020-03-16 LAB — CBC
Hematocrit: 41.5 % (ref 37.5–51.0)
Hemoglobin: 13.2 g/dL (ref 13.0–17.7)
MCH: 26.6 pg (ref 26.6–33.0)
MCHC: 31.8 g/dL (ref 31.5–35.7)
MCV: 84 fL (ref 79–97)
Platelets: 207 10*3/uL (ref 150–450)
RBC: 4.96 x10E6/uL (ref 4.14–5.80)
RDW: 15.3 % (ref 11.6–15.4)
WBC: 6.4 10*3/uL (ref 3.4–10.8)

## 2020-03-16 LAB — CMP14 + ANION GAP
ALT: 16 IU/L (ref 0–44)
AST: 12 IU/L (ref 0–40)
Albumin/Globulin Ratio: 1.7 (ref 1.2–2.2)
Albumin: 4.4 g/dL (ref 3.8–4.9)
Alkaline Phosphatase: 104 IU/L (ref 48–121)
Anion Gap: 11 mmol/L (ref 10.0–18.0)
BUN/Creatinine Ratio: 16 (ref 9–20)
BUN: 13 mg/dL (ref 6–24)
Bilirubin Total: 0.4 mg/dL (ref 0.0–1.2)
CO2: 26 mmol/L (ref 20–29)
Calcium: 9.3 mg/dL (ref 8.7–10.2)
Chloride: 102 mmol/L (ref 96–106)
Creatinine, Ser: 0.81 mg/dL (ref 0.76–1.27)
GFR calc Af Amer: 118 mL/min/{1.73_m2} (ref >59)
GFR calc non Af Amer: 102 mL/min/{1.73_m2} (ref >59)
Globulin, Total: 2.6 g/dL (ref 1.5–4.5)
Glucose: 117 mg/dL — ABNORMAL HIGH (ref 65–99)
Potassium: 4.5 mmol/L (ref 3.5–5.2)
Sodium: 139 mmol/L (ref 134–144)
Total Protein: 7 g/dL (ref 6.0–8.5)
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
## 2020-03-24 DIAGNOSIS — Z79899 Other long term (current) drug therapy: Secondary | ICD-10-CM | POA: Diagnosis not present

## 2020-03-24 DIAGNOSIS — M542 Cervicalgia: Secondary | ICD-10-CM | POA: Diagnosis not present

## 2020-03-24 DIAGNOSIS — M79605 Pain in left leg: Secondary | ICD-10-CM | POA: Diagnosis not present

## 2020-03-24 DIAGNOSIS — E669 Obesity, unspecified: Secondary | ICD-10-CM | POA: Diagnosis not present

## 2020-03-25 DIAGNOSIS — F250 Schizoaffective disorder, bipolar type: Secondary | ICD-10-CM | POA: Diagnosis not present

## 2020-03-26 ENCOUNTER — Other Ambulatory Visit: Payer: Self-pay | Admitting: Nurse Practitioner

## 2020-03-26 DIAGNOSIS — G894 Chronic pain syndrome: Secondary | ICD-10-CM

## 2020-03-30 ENCOUNTER — Other Ambulatory Visit: Payer: Self-pay | Admitting: Nurse Practitioner

## 2020-03-30 ENCOUNTER — Encounter: Payer: Self-pay | Admitting: Nurse Practitioner

## 2020-03-30 DIAGNOSIS — G894 Chronic pain syndrome: Secondary | ICD-10-CM

## 2020-04-06 ENCOUNTER — Telehealth: Payer: Self-pay

## 2020-04-06 NOTE — Telephone Encounter (Signed)
Debbie from [employer] called requesting a call back regarding pt forms for his wheelchair. [phone]   I returned her call and left him a v/m to call the office [facility]

## 2020-04-07 ENCOUNTER — Emergency Department (HOSPITAL_BASED_OUTPATIENT_CLINIC_OR_DEPARTMENT_OTHER)
Admission: EM | Admit: 2020-04-07 | Discharge: 2020-04-07 | Disposition: A | Discharge status: Home / Self Care | Payer: Medicare HMO | Attending: Emergency Medicine | Admitting: Emergency Medicine

## 2020-04-07 ENCOUNTER — Encounter (HOSPITAL_BASED_OUTPATIENT_CLINIC_OR_DEPARTMENT_OTHER): Payer: Self-pay

## 2020-04-07 ENCOUNTER — Other Ambulatory Visit: Payer: Self-pay

## 2020-04-07 ENCOUNTER — Emergency Department (HOSPITAL_BASED_OUTPATIENT_CLINIC_OR_DEPARTMENT_OTHER): Payer: Medicare HMO

## 2020-04-07 DIAGNOSIS — W228XXA Striking against or struck by other objects, initial encounter: Secondary | ICD-10-CM | POA: Insufficient documentation

## 2020-04-07 DIAGNOSIS — Y92009 Unspecified place in unspecified non-institutional (private) residence as the place of occurrence of the external cause: Secondary | ICD-10-CM | POA: Insufficient documentation

## 2020-04-07 DIAGNOSIS — Y9389 Activity, other specified: Secondary | ICD-10-CM | POA: Insufficient documentation

## 2020-04-07 DIAGNOSIS — Y999 Unspecified external cause status: Secondary | ICD-10-CM | POA: Diagnosis not present

## 2020-04-07 DIAGNOSIS — Z87891 Personal history of nicotine dependence: Secondary | ICD-10-CM | POA: Insufficient documentation

## 2020-04-07 DIAGNOSIS — S0990XA Unspecified injury of head, initial encounter: Secondary | ICD-10-CM | POA: Diagnosis not present

## 2020-04-07 HISTORY — DX: Unspecified intracranial injury with loss of consciousness of unspecified duration, initial encounter: S06.9X9A

## 2020-04-07 HISTORY — DX: Unspecified intracranial injury with loss of consciousness status unknown, initial encounter: S06.9XAA
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
## 2020-04-07 NOTE — Discharge Instructions (Addendum)
You are seen today for head injury.  Your CT scan does not show any fractures or bleeds. I want you to use the attached instructions.  You can take Tylenol as prescribed on the bottle for pain.  If you start any new worsening concerning symptoms such as weakness, numbness and tingling, falls, vision changes, worsening head pain you need  to come back to the emergency department.  Please follow-up with your primary care in the next couple of days. I hope you feel better!

## 2020-04-07 NOTE — ED Triage Notes (Signed)
Pt states he got his foot caught in carpet yesterday-fell and struck head on table-pain to left temporal area, left ear and left side of neck-states he had +LOC "30 seconds"-NAD-to triage in w/c

## 2020-04-13 DIAGNOSIS — M6281 Muscle weakness (generalized): Secondary | ICD-10-CM | POA: Diagnosis not present

## 2020-04-13 DIAGNOSIS — R2681 Unsteadiness on feet: Secondary | ICD-10-CM | POA: Diagnosis not present

## 2020-04-13 DIAGNOSIS — R262 Difficulty in walking, not elsewhere classified: Secondary | ICD-10-CM | POA: Diagnosis not present

## 2020-04-18 DIAGNOSIS — M6281 Muscle weakness (generalized): Secondary | ICD-10-CM | POA: Diagnosis not present

## 2020-04-18 DIAGNOSIS — R262 Difficulty in walking, not elsewhere classified: Secondary | ICD-10-CM | POA: Diagnosis not present

## 2020-04-18 DIAGNOSIS — R2681 Unsteadiness on feet: Secondary | ICD-10-CM | POA: Diagnosis not present

## 2020-04-20 DIAGNOSIS — R2681 Unsteadiness on feet: Secondary | ICD-10-CM | POA: Diagnosis not present

## 2020-04-20 DIAGNOSIS — M6281 Muscle weakness (generalized): Secondary | ICD-10-CM | POA: Diagnosis not present

## 2020-04-20 DIAGNOSIS — R262 Difficulty in walking, not elsewhere classified: Secondary | ICD-10-CM | POA: Diagnosis not present

## 2020-04-21 DIAGNOSIS — Z79899 Other long term (current) drug therapy: Secondary | ICD-10-CM | POA: Diagnosis not present

## 2020-04-21 DIAGNOSIS — M542 Cervicalgia: Secondary | ICD-10-CM | POA: Diagnosis not present

## 2020-04-21 DIAGNOSIS — M79605 Pain in left leg: Secondary | ICD-10-CM | POA: Diagnosis not present

## 2020-04-21 DIAGNOSIS — M545 Low back pain: Secondary | ICD-10-CM | POA: Diagnosis not present

## 2020-04-26 ENCOUNTER — Telehealth: Payer: Self-pay
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
## 2020-05-02 DIAGNOSIS — R262 Difficulty in walking, not elsewhere classified: Secondary | ICD-10-CM | POA: Diagnosis not present

## 2020-05-02 DIAGNOSIS — R2681 Unsteadiness on feet: Secondary | ICD-10-CM | POA: Diagnosis not present

## 2020-05-02 DIAGNOSIS — M6281 Muscle weakness (generalized): Secondary | ICD-10-CM | POA: Diagnosis not present

## 2020-05-03 DIAGNOSIS — N310 Uninhibited neuropathic bladder, not elsewhere classified: Secondary | ICD-10-CM | POA: Diagnosis not present

## 2020-05-03 DIAGNOSIS — R3912 Poor urinary stream: Secondary | ICD-10-CM | POA: Diagnosis not present

## 2020-05-03 DIAGNOSIS — R3914 Feeling of incomplete bladder emptying: Secondary | ICD-10-CM | POA: Diagnosis not present

## 2020-05-05 DIAGNOSIS — R262 Difficulty in walking, not elsewhere classified: Secondary | ICD-10-CM | POA: Diagnosis not present

## 2020-05-05 DIAGNOSIS — M6281 Muscle weakness (generalized): Secondary | ICD-10-CM | POA: Diagnosis not present

## 2020-05-05 DIAGNOSIS — R2681 Unsteadiness on feet: Secondary | ICD-10-CM | POA: Diagnosis not present

## 2020-05-11 DIAGNOSIS — Z1211 Encounter for screening for malignant neoplasm of colon: Secondary | ICD-10-CM | POA: Diagnosis not present

## 2020-05-13 DIAGNOSIS — R262 Difficulty in walking, not elsewhere classified: Secondary | ICD-10-CM | POA: Diagnosis not present

## 2020-05-13 DIAGNOSIS — R2681 Unsteadiness on feet: Secondary | ICD-10-CM | POA: Diagnosis not present

## 2020-05-13 DIAGNOSIS — M6281 Muscle weakness (generalized): Secondary | ICD-10-CM | POA: Diagnosis not present

## 2020-05-18 ENCOUNTER — Other Ambulatory Visit: Payer: Self-pay | Admitting: Nurse Practitioner

## 2020-05-19 DIAGNOSIS — M542 Cervicalgia: Secondary | ICD-10-CM | POA: Diagnosis not present

## 2020-05-19 DIAGNOSIS — Z79899 Other long term (current) drug therapy: Secondary | ICD-10-CM | POA: Diagnosis not present

## 2020-05-19 DIAGNOSIS — E669 Obesity, unspecified: Secondary | ICD-10-CM | POA: Diagnosis not present

## 2020-05-19 DIAGNOSIS — H524 Presbyopia: Secondary | ICD-10-CM | POA: Diagnosis not present

## 2020-05-19 DIAGNOSIS — M79605 Pain in left leg: Secondary | ICD-10-CM | POA: Diagnosis not present

## 2020-05-19 DIAGNOSIS — H52209 Unspecified astigmatism, unspecified eye: Secondary | ICD-10-CM | POA: Diagnosis not present

## 2020-05-19 DIAGNOSIS — H5213 Myopia, bilateral: Secondary | ICD-10-CM | POA: Diagnosis not present

## 2020-05-31 DIAGNOSIS — M4722 Other spondylosis with radiculopathy, cervical region: Secondary | ICD-10-CM | POA: Diagnosis not present

## 2020-05-31 DIAGNOSIS — M4322 Fusion of spine, cervical region: Secondary | ICD-10-CM | POA: Diagnosis not present

## 2020-05-31 DIAGNOSIS — M961 Postlaminectomy syndrome, not elsewhere classified: Secondary | ICD-10-CM | POA: Diagnosis not present

## 2020-06-06 DIAGNOSIS — N3000 Acute cystitis without hematuria: Secondary | ICD-10-CM | POA: Diagnosis not present

## 2020-06-06 DIAGNOSIS — R8271 Bacteriuria: Secondary | ICD-10-CM | POA: Diagnosis not present

## 2020-06-09 DIAGNOSIS — R339 Retention of urine, unspecified: Secondary | ICD-10-CM | POA: Diagnosis not present

## 2020-06-17 DIAGNOSIS — M4802 Spinal stenosis, cervical region: Secondary | ICD-10-CM | POA: Diagnosis not present

## 2020-06-17 DIAGNOSIS — Z981 Arthrodesis status: Secondary | ICD-10-CM | POA: Diagnosis not present

## 2020-06-17 DIAGNOSIS — E669 Obesity, unspecified: Secondary | ICD-10-CM | POA: Diagnosis not present

## 2020-06-17 DIAGNOSIS — M79605 Pain in left leg: Secondary | ICD-10-CM | POA: Diagnosis not present

## 2020-06-17 DIAGNOSIS — M542 Cervicalgia: Secondary | ICD-10-CM | POA: Diagnosis not present

## 2020-06-17 DIAGNOSIS — M50322 Other cervical disc degeneration at C5-C6 level: Secondary | ICD-10-CM | POA: Diagnosis not present

## 2020-06-17 DIAGNOSIS — Z79899 Other long term (current) drug therapy: Secondary | ICD-10-CM | POA: Diagnosis not present

## 2020-06-17 DIAGNOSIS — M40202 Unspecified kyphosis, cervical region: Secondary | ICD-10-CM | POA: Diagnosis not present

## 2020-06-21 ENCOUNTER — Encounter: Payer: Medicare HMO | Admitting: Nurse Practitioner

## 2020-06-21 DIAGNOSIS — M961 Postlaminectomy syndrome, not elsewhere classified: Secondary | ICD-10-CM | POA: Diagnosis not present

## 2020-06-21 DIAGNOSIS — M4322 Fusion of spine, cervical region: Secondary | ICD-10-CM | POA: Diagnosis not present

## 2020-06-21 DIAGNOSIS — M4722 Other spondylosis with radiculopathy, cervical region: Secondary | ICD-10-CM | POA: Diagnosis not present

## 2020-07-14 DIAGNOSIS — M5032 Other cervical disc degeneration, mid-cervical region, unspecified level: Secondary | ICD-10-CM | POA: Diagnosis not present

## 2020-07-14 DIAGNOSIS — M4322 Fusion of spine, cervical region: Secondary | ICD-10-CM | POA: Diagnosis not present

## 2020-07-14 DIAGNOSIS — M4722 Other spondylosis with radiculopathy, cervical region: Secondary | ICD-10-CM | POA: Diagnosis not present

## 2020-07-15 DIAGNOSIS — F332 Major depressive disorder, recurrent severe without psychotic features: Secondary | ICD-10-CM | POA: Diagnosis not present

## 2020-07-15 DIAGNOSIS — M542 Cervicalgia: Secondary | ICD-10-CM | POA: Diagnosis not present

## 2020-07-15 DIAGNOSIS — M545 Low back pain, unspecified: Secondary | ICD-10-CM | POA: Diagnosis not present

## 2020-07-15 DIAGNOSIS — M79605 Pain in left leg: Secondary | ICD-10-CM | POA: Diagnosis not present

## 2020-07-15 DIAGNOSIS — E669 Obesity, unspecified: Secondary | ICD-10-CM | POA: Diagnosis not present

## 2020-07-27 DIAGNOSIS — M5032 Other cervical disc degeneration, mid-cervical region, unspecified level: Secondary | ICD-10-CM | POA: Diagnosis not present

## 2020-08-09 DIAGNOSIS — G8250 Quadriplegia, unspecified: Secondary | ICD-10-CM | POA: Diagnosis not present

## 2020-08-15 DIAGNOSIS — M5032 Other cervical disc degeneration, mid-cervical region, unspecified level: Secondary | ICD-10-CM | POA: Diagnosis not present

## 2020-08-15 DIAGNOSIS — M4322 Fusion of spine, cervical region: Secondary | ICD-10-CM | POA: Diagnosis not present

## 2020-08-15 DIAGNOSIS — M4722 Other spondylosis with radiculopathy, cervical region: Secondary | ICD-10-CM | POA: Diagnosis not present

## 2020-08-17 DIAGNOSIS — M545 Low back pain, unspecified: Secondary | ICD-10-CM | POA: Diagnosis not present

## 2020-08-17 DIAGNOSIS — M79605 Pain in left leg: Secondary | ICD-10-CM | POA: Diagnosis not present

## 2020-08-17 DIAGNOSIS — E669 Obesity, unspecified: Secondary | ICD-10-CM | POA: Diagnosis not present

## 2020-08-17 DIAGNOSIS — M542 Cervicalgia: Secondary | ICD-10-CM | POA: Diagnosis not present

## 2020-08-29 DIAGNOSIS — M5032 Other cervical disc degeneration, mid-cervical region, unspecified level: Secondary | ICD-10-CM | POA: Diagnosis not present

## 2020-09-15 DIAGNOSIS — M79605 Pain in left leg: Secondary | ICD-10-CM | POA: Diagnosis not present

## 2020-09-15 DIAGNOSIS — E669 Obesity, unspecified: Secondary | ICD-10-CM | POA: Diagnosis not present

## 2020-09-15 DIAGNOSIS — M545 Low back pain, unspecified: Secondary | ICD-10-CM | POA: Diagnosis not present

## 2020-09-15 DIAGNOSIS — M542 Cervicalgia: Secondary | ICD-10-CM | POA: Diagnosis not present

## 2020-10-04 ENCOUNTER — Other Ambulatory Visit: Payer: Self-pay | Admitting: Nurse Practitioner

## 2020-10-04 DIAGNOSIS — G894 Chronic pain syndrome: Secondary | ICD-10-CM

## 2020-10-10 DIAGNOSIS — M79605 Pain in left leg: Secondary | ICD-10-CM | POA: Diagnosis not present

## 2020-10-10 DIAGNOSIS — M545 Low back pain, unspecified: Secondary | ICD-10-CM | POA: Diagnosis not present

## 2020-10-10 DIAGNOSIS — Z79899 Other long term (current) drug therapy: Secondary | ICD-10-CM | POA: Diagnosis not present

## 2020-10-10 DIAGNOSIS — E669 Obesity, unspecified: Secondary | ICD-10-CM | POA: Diagnosis not present

## 2020-10-10 DIAGNOSIS — M542 Cervicalgia: Secondary | ICD-10-CM | POA: Diagnosis not present

## 2020-10-27 ENCOUNTER — Other Ambulatory Visit: Payer: Self-pay | Admitting: Nurse Practitioner

## 2020-10-27 DIAGNOSIS — M5412 Radiculopathy, cervical region: Secondary | ICD-10-CM | POA: Diagnosis not present

## 2020-10-27 DIAGNOSIS — M542 Cervicalgia: Secondary | ICD-10-CM

## 2020-10-27 DIAGNOSIS — M5032 Other cervical disc degeneration, mid-cervical region, unspecified level: Secondary | ICD-10-CM | POA: Diagnosis not present

## 2020-10-27 DIAGNOSIS — G894 Chronic pain syndrome: Secondary | ICD-10-CM

## 2020-10-27 DIAGNOSIS — S060X9S Concussion with loss of consciousness of unspecified duration, sequela: Secondary | ICD-10-CM | POA: Diagnosis not present

## 2020-10-27 DIAGNOSIS — G8250 Quadriplegia, unspecified: Secondary | ICD-10-CM

## 2020-10-27 DIAGNOSIS — M4322 Fusion of spine, cervical region: Secondary | ICD-10-CM | POA: Diagnosis not present

## 2020-11-02 ENCOUNTER — Ambulatory Visit (INDEPENDENT_AMBULATORY_CARE_PROVIDER_SITE_OTHER): Payer: Medicare HMO | Admitting: Nurse Practitioner

## 2020-11-02 ENCOUNTER — Other Ambulatory Visit: Payer: Self-pay

## 2020-11-02 VITALS — BP 132/84 | HR 84 | Temp 98.6°F | Ht 65.0 in | Wt 249.0 lb

## 2020-11-02 DIAGNOSIS — R350 Frequency of micturition: Secondary | ICD-10-CM

## 2020-11-02 DIAGNOSIS — S069X3S Unspecified intracranial injury 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, sequela: Secondary | ICD-10-CM

## 2020-11-02 LAB — POCT URINALYSIS DIPSTICK
Bilirubin, UA: NEGATIVE
Blood, UA: NEGATIVE
Glucose, UA: NEGATIVE
Ketones, UA: NEGATIVE
Nitrite, UA: NEGATIVE
Protein, UA: POSITIVE — AB
Spec Grav, UA: >=1.03 — AB (ref 1.010–1.025)
Urobilinogen, UA: 0.2 E.U./dL
pH, UA: 5.5 (ref 5.0–8.0)

## 2020-11-02 NOTE — Progress Notes (Signed)
Rutherford Nail as a scribe for [name], FNP.,have documented all relevant documentation on the behalf of [name], FNP,as directed by  [name], FNP while in the presence of [name], [place]. This visit occurred during the SARS-CoV-2 public health emergency.  Safety protocols were in place, including screening questions prior to the visit, additional usage of staff PPE, and extensive cleaning of exam room while observing appropriate contact time as indicated for disinfecting solutions.  Subjective:     Patient ID: Vincent Mems Sr. , male    DOB: 1966-04-13 , 54 y.o.   MRN: [id number]   Chief Complaint  Patient presents with  . Back Pain  . Urinary Frequency    HPI  Pt is here today for back pain, and urinary frequency. His wife is here with him today as well  He is seeing Dr. Laqueta Linden at [facility]. He is also on Robaxin but has been out at the pharmacy.  He is waiting for his Robaxin to come in the mail    Urinary Frequency  This is a recurrent problem. The problem occurs every urination. There has been no fever. Associated symptoms include frequency. Associated symptoms comments: He is using his catheters more than usual and has white discharge. .  Neck Pain  This is a new problem. The current episode started 1 to 4 weeks ago (3 weeks ago - but is continuing). The problem occurs constantly.     Past Medical History:  Diagnosis Date  . Brain injury ([place])   . Chronic back pain   . History of suicidal ideation 01/2017   with overdose/ [facility] admission  . Lumbago with sciatica    left side  . Vitamin D deficiency      Family History  Problem Relation Age of Onset  . Hypothyroidism Mother   . Hypertension Father      Current Outpatient Medications:  .  diclofenac sodium (VOLTAREN) 1 % GEL, Apply 2 g 4 (four) times daily topically., Disp: 4 Tube, Rfl: 0 .  docusate sodium (COLACE) 50 MG capsule, Take 50 mg by mouth 2 (two) times  daily., Disp: , Rfl:  .  gabapentin (NEURONTIN) 600 MG tablet, TAKE 1 TABLET THREE TIMES DAILY (Patient taking differently: 2 (two) times daily.), Disp: 90 tablet, Rfl: 3 .  GNP MAGNESIUM OXIDE 250 MG TABS, TAKE 1 TABLET (250 MG TOTAL) BY MOUTH EVERY EVENING., Disp: 90 tablet, Rfl: 1 .  lidocaine (LIDODERM) 5 %, Place 1 patch onto the skin daily. Remove & Discard patch within 12 hours or as directed by MD, Disp: 30 patch, Rfl: 0 .  meloxicam (MOBIC) 7.5 MG tablet, Take 1 tablet (7.5 mg total) by mouth daily., Disp: 90 tablet, Rfl: 1 .  methocarbamol (ROBAXIN) 500 MG tablet, TAKE 1 TABLET EVERY 8 HOURS AS NEEDED FOR MUSCLE SPASM(S), Disp: 30 tablet, Rfl: 0 .  naphazoline-glycerin (CLEAR EYES) 0.012-0.2 % SOLN, Place 2 drops 4 (four) times daily -  with meals and at bedtime into both eyes., Disp: 30 mL, Rfl: 0 .  oxycodone-acetaminophen (PERCOCET) 2.5-325 MG tablet, Take 1 tablet by mouth every 4 (four) hours as needed for pain. 5-3.25 per pt wife, Disp: , Rfl:  .  sertraline (ZOLOFT) 50 MG tablet, Take 75 mg by mouth daily., Disp: , Rfl:  .  tamsulosin (FLOMAX) 0.4 MG CAPS capsule, Take 1 capsule (0.4 mg total) by mouth daily., Disp: 90 capsule, Rfl: 1 .  bethanechol (URECHOLINE) 25 MG tablet, TAKE 1  TABLET BY MOUTH THREE TIMES A DAY (Patient not taking: Reported on 11/02/2020), Disp: 90 tablet, Rfl: 0   No Known Allergies   Review of Systems  Constitutional: Negative.  Negative for fatigue.  HENT: Negative.   Endocrine: Negative for polydipsia, polyphagia and polyuria.  Genitourinary: Positive for frequency.  Musculoskeletal: Positive for neck pain.  Skin: Negative.   Neurological: Negative for dizziness.  Psychiatric/Behavioral: Negative.      Today's Vitals   11/02/20 1148  BP: 132/84  Pulse: 84  Temp: 98.6 F (37 C)  TempSrc: Oral  Weight: 249 lb (112.9 kg)  Height: 5\' 5"  (1.651 m)   Body mass index is 41.44 kg/m.  Wt Readings from Last 3 Encounters:  11/02/20 249 lb (112.9  kg)  04/07/20 (!) 234 lb (106.1 kg)  03/16/20 240 lb (108.9 kg)   Objective:  Physical Exam Vitals reviewed.  Constitutional:      General: He is not in acute distress.    Appearance: Normal appearance. He is obese.  HENT:     Head: Normocephalic.     Right Ear: Tympanic membrane, ear canal and external ear normal. There is no impacted cerumen.     Left Ear: Tympanic membrane, ear canal and external ear normal. There is no impacted cerumen.     Nose: Nose normal. No congestion.     Mouth/Throat:     Mouth: Mucous membranes are moist.  Eyes:     Extraocular Movements: Extraocular movements intact.     Conjunctiva/sclera: Conjunctivae normal.     Pupils: Pupils are equal, round, and reactive to light.  Cardiovascular:     Rate and Rhythm: Normal rate and regular rhythm.     Pulses: Normal pulses.     Heart sounds: Normal heart sounds. No murmur heard.   Pulmonary:     Effort: Pulmonary effort is normal. No respiratory distress.     Breath sounds: Normal breath sounds. No wheezing.  Skin:    General: Skin is warm and dry.     Capillary Refill: Capillary refill takes less than 2 seconds.  Neurological:     General: No focal deficit present.     Mental Status: He is alert and oriented to person, place, and time.     Cranial Nerves: No cranial nerve deficit.  Psychiatric:        Mood and Affect: Mood normal.        Behavior: Behavior normal.        Thought Content: Thought content normal.        Judgment: Judgment normal.         Assessment And Plan:     1. Frequency of urination  He has protein in his urine and small leukocytes  Will send for culture due to his history   Pending results will treat with an antibiotic - POCT Urinalysis Dipstick (81002) - Culture, Urine  2. Traumatic brain injury, 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, sequela ([place])  Any resources will help him due to his traumatic brain injury - AMB Referral to [place]     Patient was given opportunity to ask questions. Patient verbalized understanding of the plan and was able to repeat key elements of the plan. All questions were answered to their satisfaction.  [name], FNP   I, [name], FNP, have reviewed all documentation for this visit. The documentation on 11/01/20 for the exam, diagnosis, procedures, and orders are all accurate and  complete.    THE PATIENT IS ENCOURAGED TO PRACTICE SOCIAL DISTANCING DUE TO THE COVID-19 PANDEMIC.

## 2020-11-03 DIAGNOSIS — Z981 Arthrodesis status: Secondary | ICD-10-CM | POA: Diagnosis not present

## 2020-11-03 DIAGNOSIS — S060X9S Concussion with loss of consciousness of unspecified duration, sequela: Secondary | ICD-10-CM | POA: Diagnosis not present

## 2020-11-03 DIAGNOSIS — R339 Retention of urine, unspecified: Secondary | ICD-10-CM | POA: Diagnosis not present

## 2020-11-03 DIAGNOSIS — R519 Headache, unspecified: Secondary | ICD-10-CM | POA: Diagnosis not present

## 2020-11-03 DIAGNOSIS — G44309 Post-traumatic headache, unspecified, not intractable: Secondary | ICD-10-CM | POA: Diagnosis not present

## 2020-11-04 LAB — URINE CULTURE

## 2020-11-07 DIAGNOSIS — M542 Cervicalgia: Secondary | ICD-10-CM | POA: Diagnosis not present

## 2020-11-07 DIAGNOSIS — Z87820 Personal history of traumatic brain injury: Secondary | ICD-10-CM | POA: Diagnosis not present

## 2020-11-07 DIAGNOSIS — M545 Low back pain, unspecified: Secondary | ICD-10-CM | POA: Diagnosis not present

## 2020-11-07 DIAGNOSIS — Z79899 Other long term (current) drug therapy: Secondary | ICD-10-CM | POA: Diagnosis not present

## 2020-11-07 DIAGNOSIS — M79605 Pain in left leg: Secondary | ICD-10-CM | POA: Diagnosis not present

## 2020-11-17 DIAGNOSIS — M50122 Cervical disc disorder at C5-C6 level with radiculopathy: Secondary | ICD-10-CM | POA: Diagnosis not present

## 2020-11-17 DIAGNOSIS — M5412 Radiculopathy, cervical region: Secondary | ICD-10-CM | POA: Diagnosis not present

## 2020-11-17 DIAGNOSIS — M4722 Other spondylosis with radiculopathy, cervical region: Secondary | ICD-10-CM | POA: Diagnosis not present

## 2020-11-17 DIAGNOSIS — M961 Postlaminectomy syndrome, not elsewhere classified: Secondary | ICD-10-CM | POA: Diagnosis not present

## 2020-12-03 ENCOUNTER — Encounter: Payer: Self-pay | Admitting: Nurse Practitioner

## 2020-12-08 DIAGNOSIS — M79605 Pain in left leg: Secondary | ICD-10-CM | POA: Diagnosis not present

## 2020-12-08 DIAGNOSIS — M542 Cervicalgia: Secondary | ICD-10-CM | POA: Diagnosis not present

## 2020-12-08 DIAGNOSIS — Z79899 Other long term (current) drug therapy: Secondary | ICD-10-CM | POA: Diagnosis not present

## 2020-12-08 DIAGNOSIS — M545 Low back pain, unspecified: Secondary | ICD-10-CM | POA: Diagnosis not present

## 2020-12-08 DIAGNOSIS — Z87820 Personal history of traumatic brain injury: Secondary | ICD-10-CM | POA: Diagnosis not present

## 2020-12-12 ENCOUNTER — Other Ambulatory Visit: Payer: Self-pay | Admitting: Nurse Practitioner

## 2020-12-12 DIAGNOSIS — G894 Chronic pain syndrome: Secondary | ICD-10-CM

## 2020-12-16 ENCOUNTER — Telehealth: Payer: Self-pay | Admitting: *Deleted

## 2020-12-16 NOTE — Chronic Care Management (AMB) (Signed)
  Chronic Care Management   Note  12/16/2020 Name: Vincent Radke Sr. MRN: [id number] DOB: 09-28-1965  Vincent Mems Sr. is a 55 y.o. year old male who is a primary care patient of [name], [place]. I reached out to [place]. by phone today in response to a referral sent by [name]'s PCP, [name], FNP     [name] was given information about Chronic Care Management services today including:  1. CCM service includes personalized support from designated clinical staff supervised by his physician, including individualized plan of care and coordination with other care providers 2. 24/7 contact phone numbers for assistance for urgent and routine care needs. 3. Service will only be billed when office clinical staff spend 20 minutes or more in a month to coordinate care. 4. Only one practitioner may furnish and bill the service in a calendar month. 5. The patient may stop CCM services at any time (effective at the end of the month) by phone call to the office staff. 6. The patient will be responsible for cost sharing (co-pay) of up to 20% of the service fee (after annual deductible is met).  Patient agreed to services and verbal consent obtained.   Follow up plan: Telephone appointment with care management team member scheduled for:12/28/2020  [facility]

## 2020-12-22 ENCOUNTER — Other Ambulatory Visit: Payer: Self-pay

## 2020-12-22 ENCOUNTER — Ambulatory Visit (INDEPENDENT_AMBULATORY_CARE_PROVIDER_SITE_OTHER): Payer: Medicare HMO | Admitting: Nurse Practitioner

## 2020-12-22 ENCOUNTER — Encounter: Payer: Self-pay | Admitting: Nurse Practitioner

## 2020-12-22 ENCOUNTER — Ambulatory Visit (HOSPITAL_COMMUNITY): Payer: Medicare HMO | Attending: Nurse Practitioner

## 2020-12-22 VITALS — BP 138/82 | HR 77 | Temp 97.7°F

## 2020-12-22 DIAGNOSIS — R600 Localized edema: Secondary | ICD-10-CM

## 2020-12-22 DIAGNOSIS — Z01818 Encounter for other preprocedural examination: Secondary | ICD-10-CM

## 2020-12-22 MED ORDER — FUROSEMIDE 20 MG PO TABS
20.0000 mg | ORAL_TABLET | Freq: Every day | ORAL | 1 refills | Status: DC
Start: 2020-12-22 — End: 2021-01-13

## 2020-12-22 NOTE — Progress Notes (Signed)
Rutherford Nail as a scribe for [name], FNP.,have documented all relevant documentation on the behalf of [name], FNP,as directed by  [name], FNP while in the presence of [name], [place]. This visit occurred during the SARS-CoV-2 public health emergency.  Safety protocols were in place, including screening questions prior to the visit, additional usage of staff PPE, and extensive cleaning of exam room while observing appropriate contact time as indicated for disinfecting solutions.  Subjective:     Patient ID: Vincent Mems Sr. , male    DOB: [birth year] , 55 y.o.   MRN: [id number]   Chief Complaint  Patient presents with  . Medical Clearance  . Leg Swelling    HPI  Pt here today for surgical clearance for C5-6 ACDF with plate, allograft.  Denies shortness of breath or chest pain.   He sleeps in a recliner due to having back pain and the ability to get out of the bed.  She has an extra firm mattress.  After sleeping in his recliner last weekend he did not have his legs reclined and now has swelling to his legs with the left worse than the right. The adjustable mattress is not separate from his wife.     Past Medical History:  Diagnosis Date  . Brain injury ([place])   . Chronic back pain   . History of suicidal ideation 01/2017   with overdose/ [facility] admission  . Lumbago with sciatica    left side  . Vitamin D deficiency      Family History  Problem Relation Age of Onset  . Hypothyroidism Mother   . Hypertension Father      Current Outpatient Medications:  .  diclofenac sodium (VOLTAREN) 1 % GEL, Apply 2 g 4 (four) times daily topically. (Patient not taking: Reported on 12/29/2020), Disp: 4 Tube, Rfl: 0 .  docusate sodium (COLACE) 50 MG capsule, Take 50 mg by mouth 2 (two) times daily. (Patient not taking: Reported on 12/29/2020), Disp: , Rfl:  .  furosemide (LASIX) 20 MG tablet, Take 1 tablet (20 mg total) by mouth daily. (Patient not taking:  Reported on 12/29/2020), Disp: 30 tablet, Rfl: 1 .  gabapentin (NEURONTIN) 600 MG tablet, TAKE 1 TABLET THREE TIMES DAILY (Patient taking differently: 2 (two) times daily.), Disp: 90 tablet, Rfl: 3 .  GNP MAGNESIUM OXIDE 250 MG TABS, TAKE 1 TABLET (250 MG TOTAL) BY MOUTH EVERY EVENING., Disp: 90 tablet, Rfl: 1 .  meloxicam (MOBIC) 7.5 MG tablet, Take 1 tablet (7.5 mg total) by mouth daily., Disp: 90 tablet, Rfl: 1 .  methocarbamol (ROBAXIN) 500 MG tablet, TAKE 1 TABLET EVERY 8 HOURS AS NEEDED FOR MUSCLE SPASM(S), Disp: 30 tablet, Rfl: 0 .  naphazoline-glycerin (CLEAR EYES) 0.012-0.2 % SOLN, Place 2 drops 4 (four) times daily -  with meals and at bedtime into both eyes., Disp: 30 mL, Rfl: 0 .  oxycodone-acetaminophen (PERCOCET) 2.5-325 MG tablet, Take 1 tablet by mouth every 4 (four) hours as needed for pain. 5-3.25 per pt wife, Disp: , Rfl:  .  sertraline (ZOLOFT) 50 MG tablet, Take 75 mg by mouth daily., Disp: , Rfl:  .  tamsulosin (FLOMAX) 0.4 MG CAPS capsule, TAKE 1 CAPSULE EVERY DAY, Disp: 90 capsule, Rfl: 1 .  bethanechol (URECHOLINE) 25 MG tablet, TAKE 1 TABLET BY MOUTH THREE TIMES A DAY (Patient not taking: No sig reported), Disp: 90 tablet, Rfl: 0 .  lidocaine (LIDODERM) 5 %, Place 1 patch onto the skin daily. Remove &  Discard patch within 12 hours or as directed by MD, Disp: 30 patch, Rfl: 0   No Known Allergies   Review of Systems  Constitutional: Negative.  Negative for fatigue.  HENT: Negative.   Cardiovascular: Positive for leg swelling.  Gastrointestinal: Negative.   Endocrine: Negative for polydipsia, polyphagia and polyuria.  Musculoskeletal: Negative.   Skin: Negative.   Neurological: Negative for dizziness and headaches.  Psychiatric/Behavioral: Negative.      Today's Vitals   12/22/20 0854  BP: 138/82  Pulse: 77  Temp: 97.7 F (36.5 C)   There is no height or weight on file to calculate BMI.   Objective:  Physical Exam Vitals reviewed.  Constitutional:       General: He is not in acute distress.    Appearance: Normal appearance. He is obese.  HENT:     Head: Normocephalic.     Right Ear: Tympanic membrane, ear canal and external ear normal. There is no impacted cerumen.     Left Ear: Tympanic membrane, ear canal and external ear normal. There is no impacted cerumen.     Nose: Nose normal. No congestion.     Mouth/Throat:     Mouth: Mucous membranes are moist.  Eyes:     Extraocular Movements: Extraocular movements intact.     Conjunctiva/sclera: Conjunctivae normal.     Pupils: Pupils are equal, round, and reactive to light.  Cardiovascular:     Rate and Rhythm: Normal rate and regular rhythm.     Pulses: Normal pulses.     Heart sounds: Normal heart sounds. No murmur heard.   Pulmonary:     Effort: Pulmonary effort is normal. No respiratory distress.     Breath sounds: Normal breath sounds. No wheezing.  Skin:    General: Skin is warm and dry.     Capillary Refill: Capillary refill takes less than 2 seconds.  Neurological:     General: No focal deficit present.     Mental Status: He is alert and oriented to person, place, and time.     Cranial Nerves: No cranial nerve deficit.  Psychiatric:        Mood and Affect: Mood normal.        Behavior: Behavior normal.        Thought Content: Thought content normal.        Judgment: Judgment normal.         Assessment And Plan:     1. Lower extremity edema  I have encouraged him to sleep with his legs elevated and to avoid sleeping in a recliner  Will check venous doppler to evaluate for clots or insufficiency   Encouraged to wear support socks to help with swelling - Brain natriuretic peptide - furosemide (LASIX) 20 MG tablet; Take 1 tablet (20 mg total) by mouth daily. (Patient not taking: Reported on 12/29/2020)  Dispense: 30 tablet; Refill: 1 - VAS [place] LOWER EXTREMITY VENOUS (DVT); Future  2. Pre-operative clearance  EKG with SR nonspecific t abnormality  Will check labs  pending results will be able to clear for surgery - CMP14+EGFR - CBC - EKG 12-Lead      Patient was given opportunity to ask questions. Patient verbalized understanding of the plan and was able to repeat key elements of the plan. All questions were answered to their satisfaction.  [name], FNP    I, [name], FNP, have reviewed all documentation for this visit. The documentation on 12/22/20 for the exam, diagnosis, procedures, and orders  are all accurate and complete.  IF YOU HAVE BEEN REFERRED TO A SPECIALIST, IT MAY TAKE 1-2 WEEKS TO SCHEDULE/PROCESS THE REFERRAL. IF YOU HAVE NOT HEARD FROM US/SPECIALIST IN TWO WEEKS, PLEASE GIVE [place] A CALL AT [phone].   THE PATIENT IS ENCOURAGED TO PRACTICE SOCIAL DISTANCING DUE TO THE COVID-19 PANDEMIC.

## 2020-12-23 LAB — BRAIN NATRIURETIC PEPTIDE: BNP: 12.4 pg/mL (ref 0.0–100.0)

## 2020-12-23 LAB — CBC
Hematocrit: 41.1 % (ref 37.5–51.0)
Hemoglobin: 13.7 g/dL (ref 13.0–17.7)
MCH: 26.9 pg (ref 26.6–33.0)
MCHC: 33.3 g/dL (ref 31.5–35.7)
MCV: 81 fL (ref 79–97)
Platelets: 206 10*3/uL (ref 150–450)
RBC: 5.09 x10E6/uL (ref 4.14–5.80)
RDW: 15.6 % — ABNORMAL HIGH (ref 11.6–15.4)
WBC: 5.7 10*3/uL (ref 3.4–10.8)

## 2020-12-23 LAB — CMP14+EGFR
ALT: 39 IU/L (ref 0–44)
AST: 28 IU/L (ref 0–40)
Albumin/Globulin Ratio: 1.8 (ref 1.2–2.2)
Albumin: 4.5 g/dL (ref 3.8–4.9)
Alkaline Phosphatase: 98 IU/L (ref 44–121)
BUN/Creatinine Ratio: 8 — ABNORMAL LOW (ref 9–20)
BUN: 7 mg/dL (ref 6–24)
Bilirubin Total: 0.4 mg/dL (ref 0.0–1.2)
CO2: 22 mmol/L (ref 20–29)
Calcium: 9.5 mg/dL (ref 8.7–10.2)
Chloride: 101 mmol/L (ref 96–106)
Creatinine, Ser: 0.85 mg/dL (ref 0.76–1.27)
Globulin, Total: 2.5 g/dL (ref 1.5–4.5)
Glucose: 118 mg/dL — ABNORMAL HIGH (ref 65–99)
Potassium: 4.4 mmol/L (ref 3.5–5.2)
Sodium: 138 mmol/L (ref 134–144)
Total Protein: 7 g/dL (ref 6.0–8.5)
eGFR: 104 mL/min/{1.73_m2} (ref >59)

## 2020-12-26 ENCOUNTER — Other Ambulatory Visit: Payer: Self-pay

## 2020-12-26 ENCOUNTER — Ambulatory Visit (HOSPITAL_COMMUNITY)
Admission: RE | Admit: 2020-12-26 | Discharge: 2020-12-26 | Disposition: A | Discharge status: Home / Self Care | Payer: Medicare HMO | Source: Ambulatory Visit | Attending: Nurse Practitioner | Admitting: Nurse Practitioner

## 2020-12-26 DIAGNOSIS — R600 Localized edema: Secondary | ICD-10-CM | POA: Diagnosis not present

## 2020-12-26 NOTE — Progress Notes (Signed)
Bilateral lower extremity venous duplex has been completed. Preliminary results can be found in CV Proc through chart review.  Results were faxed to [name] FNP.  12/26/20 10:07 AM Carlos Levering RVT

## 2020-12-28 ENCOUNTER — Encounter: Payer: Self-pay | Admitting: Nurse Practitioner

## 2020-12-28 ENCOUNTER — Telehealth: Payer: Self-pay

## 2020-12-28 ENCOUNTER — Telehealth: Payer: Medicare HMO

## 2020-12-28 NOTE — Telephone Encounter (Signed)
  Chronic Care Management   Outreach Note  12/28/2020 Name: [name]. MRN: [id number] DOB: 1966-01-23  Referred by: [name], FNP Reason for referral : Chronic Care Management (Unsuccessful call)   An unsuccessful telephone outreach was attempted today. The patient was referred to the case management team for assistance with care management and care coordination.   Follow Up Plan: A HIPAA compliant phone message was left for the patient providing contact information and requesting a return call.  The care management team will reach out to the patient again over the next 21 days.   Daneen Schick, BSW, CDP Social Worker, Certified Dementia Practitioner [place] [facility] [place] [facility] [phone]

## 2020-12-29 ENCOUNTER — Telehealth: Payer: Self-pay | Admitting: *Deleted

## 2020-12-29 ENCOUNTER — Ambulatory Visit (INDEPENDENT_AMBULATORY_CARE_PROVIDER_SITE_OTHER): Payer: Medicare HMO

## 2020-12-29 VITALS — Ht 65.0 in | Wt 247.0 lb

## 2020-12-29 DIAGNOSIS — Z0000 Encounter for general adult medical examination without abnormal findings: Secondary | ICD-10-CM | POA: Diagnosis not present

## 2020-12-29 NOTE — Chronic Care Management (AMB) (Signed)
  Care Management   Note  12/29/2020 Name: Ysabel Cowgill Sr. MRN: [id number] DOB: [birth year]  [name] is a 55 y.o. year old male who is a primary care patient of [name], [place] and is actively engaged with the care management team. I reached out to [place]. by phone today to assist with re-scheduling an initial visit with the BSW.  Follow up plan: Unsuccessful telephone outreach attempt made. A HIPAA compliant phone message was left for the patient providing contact information and requesting a return call.  The care management team will reach out to the patient again over the next 7 days.  If patient returns call to provider office, please advise to call [place] at [phone].  [place] [facility]

## 2020-12-29 NOTE — Patient Instructions (Signed)
[name] , Thank you for taking time to come for your Medicare Wellness Visit. I appreciate your ongoing commitment to your health goals. Please review the following plan we discussed and let me know if I can assist you in the future.   Screening recommendations/referrals: Colonoscopy: completed per patient Recommended yearly ophthalmology/optometry visit for glaucoma screening and checkup Recommended yearly dental visit for hygiene and checkup  Vaccinations: Influenza vaccine: due 04/10/2021 Pneumococcal vaccine: n/a Tdap vaccine: completed 05/03/2017, due 05/04/2023 Shingles vaccine: discussed   Covid-19:  09/01/2020, 12/22/2019, 12/01/2019  Advanced directives: Advance directive discussed with you today.   Conditions/risks identified: none  Next appointment: Follow up in one year for your annual wellness visit   Preventive Care 40-64 Years, Male Preventive care refers to lifestyle choices and visits with your health care provider that can promote health and wellness. What does preventive care include?  A yearly physical exam. This is also called an annual well check.  Dental exams once or twice a year.  Routine eye exams. Ask your health care provider how often you should have your eyes checked.  Personal lifestyle choices, including:  Daily care of your teeth and gums.  Regular physical activity.  Eating a healthy diet.  Avoiding tobacco and drug use.  Limiting alcohol use.  Practicing safe sex.  Taking low-dose aspirin every day starting at age 79. What happens during an annual well check? The services and screenings done by your health care provider during your annual well check will depend on your age, overall health, lifestyle risk factors, and family history of disease. Counseling  Your health care provider may ask you questions about your:  Alcohol use.  Tobacco use.  Drug use.  Emotional well-being.  Home and relationship well-being.  Sexual  activity.  Eating habits.  Work and work Statistician. Screening  You may have the following tests or measurements:  Height, weight, and BMI.  Blood pressure.  Lipid and cholesterol levels. These may be checked every 5 years, or more frequently if you are over 72 years old.  Skin check.  Lung cancer screening. You may have this screening every year starting at age 37 if you have a 30-pack-year history of smoking and currently smoke or have quit within the past 15 years.  Fecal occult blood test (FOBT) of the stool. You may have this test every year starting at age 23.  Flexible sigmoidoscopy or colonoscopy. You may have a sigmoidoscopy every 5 years or a colonoscopy every 10 years starting at age 46.  Prostate cancer screening. Recommendations will vary depending on your family history and other risks.  Hepatitis C blood test.  Hepatitis B blood test.  Sexually transmitted disease (STD) testing.  Diabetes screening. This is done by checking your blood sugar (glucose) after you have not eaten for a while (fasting). You may have this done every 1-3 years. Discuss your test results, treatment options, and if necessary, the need for more tests with your health care provider. Vaccines  Your health care provider may recommend certain vaccines, such as:  Influenza vaccine. This is recommended every year.  Tetanus, diphtheria, and acellular pertussis (Tdap, Td) vaccine. You may need a Td booster every 10 years.  Zoster vaccine. You may need this after age 105.  Pneumococcal 13-valent conjugate (PCV13) vaccine. You may need this if you have certain conditions and have not been vaccinated.  Pneumococcal polysaccharide (PPSV23) vaccine. You may need one or two doses if you smoke cigarettes or if you have  certain conditions. Talk to your health care provider about which screenings and vaccines you need and how often you need them. This information is not intended to replace advice  given to you by your health care provider. Make sure you discuss any questions you have with your health care provider. Document Released: 09/23/2015 Document Revised: 05/16/2016 Document Reviewed: 06/28/2015 Elsevier Interactive Patient Education  2017 [place] Prevention in the Home Falls can cause injuries. They can happen to people of all ages. There are many things you can do to make your home safe and to help prevent falls. What can I do on the outside of my home?  Regularly fix the edges of walkways and driveways and fix any cracks.  Remove anything that might make you trip as you walk through a door, such as a raised step or threshold.  Trim any bushes or trees on the path to your home.  Use bright outdoor lighting.  Clear any walking paths of anything that might make someone trip, such as rocks or tools.  Regularly check to see if handrails are loose or broken. Make sure that both sides of any steps have handrails.  Any raised decks and porches should have guardrails on the edges.  Have any leaves, snow, or ice cleared regularly.  Use sand or salt on walking paths during winter.  Clean up any spills in your garage right away. This includes oil or grease spills. What can I do in the bathroom?  Use night lights.  Install grab bars by the toilet and in the tub and shower. Do not use towel bars as grab bars.  Use non-skid mats or decals in the tub or shower.  If you need to sit down in the shower, use a plastic, non-slip stool.  Keep the floor dry. Clean up any water that spills on the floor as soon as it happens.  Remove soap buildup in the tub or shower regularly.  Attach bath mats securely with double-sided non-slip rug tape.  Do not have throw rugs and other things on the floor that can make you trip. What can I do in the bedroom?  Use night lights.  Make sure that you have a light by your bed that is easy to reach.  Do not use any sheets or  blankets that are too big for your bed. They should not hang down onto the floor.  Have a firm chair that has side arms. You can use this for support while you get dressed.  Do not have throw rugs and other things on the floor that can make you trip. What can I do in the kitchen?  Clean up any spills right away.  Avoid walking on wet floors.  Keep items that you use a lot in easy-to-reach places.  If you need to reach something above you, use a strong step stool that has a grab bar.  Keep electrical cords out of the way.  Do not use floor polish or wax that makes floors slippery. If you must use wax, use non-skid floor wax.  Do not have throw rugs and other things on the floor that can make you trip. What can I do with my stairs?  Do not leave any items on the stairs.  Make sure that there are handrails on both sides of the stairs and use them. Fix handrails that are broken or loose. Make sure that handrails are as long as the stairways.  Check any carpeting to  make sure that it is firmly attached to the stairs. Fix any carpet that is loose or worn.  Avoid having throw rugs at the top or bottom of the stairs. If you do have throw rugs, attach them to the floor with carpet tape.  Make sure that you have a light switch at the top of the stairs and the bottom of the stairs. If you do not have them, ask someone to add them for you. What else can I do to help prevent falls?  Wear shoes that:  Do not have high heels.  Have rubber bottoms.  Are comfortable and fit you well.  Are closed at the toe. Do not wear sandals.  If you use a stepladder:  Make sure that it is fully opened. Do not climb a closed stepladder.  Make sure that both sides of the stepladder are locked into place.  Ask someone to hold it for you, if possible.  Clearly mark and make sure that you can see:  Any grab bars or handrails.  First and last steps.  Where the edge of each step is.  Use tools  that help you move around (mobility aids) if they are needed. These include:  Canes.  Walkers.  Scooters.  Crutches.  Turn on the lights when you go into a dark area. Replace any light bulbs as soon as they burn out.  Set up your furniture so you have a clear path. Avoid moving your furniture around.  If any of your floors are uneven, fix them.  If there are any pets around you, be aware of where they are.  Review your medicines with your doctor. Some medicines can make you feel dizzy. This can increase your chance of falling. Ask your doctor what other things that you can do to help prevent falls. This information is not intended to replace advice given to you by your health care provider. Make sure you discuss any questions you have with your health care provider. Document Released: 06/23/2009 Document Revised: 02/02/2016 Document Reviewed: 10/01/2014 Elsevier Interactive Patient Education  2017 [employer].

## 2020-12-29 NOTE — Chronic Care Management (AMB) (Signed)
  Care Management   Note  12/29/2020 Name: Vincent Forget Sr. MRN: [id number] DOB: 1966-02-23  Vincent Mems Sr. is a 55 y.o. year old male who is a primary care patient of [name], [place] and is actively engaged with the care management team. I reached out to [place]. by phone today to assist with re-scheduling an initial visit with the BSW  Follow up plan: Telephone appointment with care management team member scheduled for:01/18/2021  [place] [facility]
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
## 2021-01-05 ENCOUNTER — Telehealth: Payer: Self-pay

## 2021-01-05 DIAGNOSIS — Z6841 Body Mass Index (BMI) 40.0 and over, adult: Secondary | ICD-10-CM | POA: Diagnosis not present

## 2021-01-05 DIAGNOSIS — M545 Low back pain, unspecified: Secondary | ICD-10-CM | POA: Diagnosis not present

## 2021-01-05 DIAGNOSIS — M79605 Pain in left leg: Secondary | ICD-10-CM | POA: Diagnosis not present

## 2021-01-05 DIAGNOSIS — R030 Elevated blood-pressure reading, without diagnosis of hypertension: Secondary | ICD-10-CM | POA: Diagnosis not present

## 2021-01-05 DIAGNOSIS — Z20822 Contact with and (suspected) exposure to covid-19: Secondary | ICD-10-CM | POA: Diagnosis not present

## 2021-01-05 DIAGNOSIS — M542 Cervicalgia: Secondary | ICD-10-CM | POA: Diagnosis not present

## 2021-01-05 NOTE — Telephone Encounter (Signed)
-----   Message from [name], [place] sent at 12/26/2020 10:39 AM EDT ----- You do not have a blood clot, how is the swelling?

## 2021-01-05 NOTE — Telephone Encounter (Signed)
Left a detailed message.

## 2021-01-13 ENCOUNTER — Other Ambulatory Visit: Payer: Self-pay | Admitting: Nurse Practitioner

## 2021-01-13 DIAGNOSIS — R600 Localized edema: Secondary | ICD-10-CM

## 2021-01-14 DIAGNOSIS — M542 Cervicalgia: Secondary | ICD-10-CM | POA: Diagnosis not present

## 2021-01-14 DIAGNOSIS — Z87820 Personal history of traumatic brain injury: Secondary | ICD-10-CM | POA: Diagnosis not present

## 2021-01-14 DIAGNOSIS — M545 Low back pain, unspecified: Secondary | ICD-10-CM | POA: Diagnosis not present

## 2021-01-14 DIAGNOSIS — Z79899 Other long term (current) drug therapy: Secondary | ICD-10-CM | POA: Diagnosis not present

## 2021-01-14 DIAGNOSIS — M79605 Pain in left leg: Secondary | ICD-10-CM | POA: Diagnosis not present

## 2021-01-18 ENCOUNTER — Telehealth: Payer: Medicare HMO

## 2021-01-18 ENCOUNTER — Ambulatory Visit: Payer: Self-pay

## 2021-01-18 ENCOUNTER — Ambulatory Visit (INDEPENDENT_AMBULATORY_CARE_PROVIDER_SITE_OTHER): Payer: Medicare HMO

## 2021-01-18 DIAGNOSIS — R300 Dysuria: Secondary | ICD-10-CM

## 2021-01-18 DIAGNOSIS — N319 Neuromuscular dysfunction of bladder, unspecified: Secondary | ICD-10-CM

## 2021-01-18 DIAGNOSIS — F331 Major depressive disorder, recurrent, moderate: Secondary | ICD-10-CM

## 2021-01-18 DIAGNOSIS — G894 Chronic pain syndrome: Secondary | ICD-10-CM

## 2021-01-18 DIAGNOSIS — M542 Cervicalgia: Secondary | ICD-10-CM

## 2021-01-18 DIAGNOSIS — S069X3S Unspecified intracranial injury 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, sequela: Secondary | ICD-10-CM

## 2021-01-18 DIAGNOSIS — G8929 Other chronic pain: Secondary | ICD-10-CM

## 2021-01-18 NOTE — Progress Notes (Signed)
Chronic Care Management   CCM RN Visit Note  01/18/2021 Name: Vincent Servellon Sr. MRN: [id number] DOB: Feb 28, 1966  Subjective: Vincent Mems Sr. is a 55 y.o. year old male who is a primary care patient of [name], [place]. The care management team was consulted for assistance with disease management and care coordination needs.    Collaboration with Daneen Schick BSW  for Case Collaboration  in response to provider referral for case management and/or care coordination services.   Consent to Services:  The patient was given the following information about Chronic Care Management services today, agreed to services, and gave verbal consent: 1. CCM service includes personalized support from designated clinical staff supervised by the primary care provider, including individualized plan of care and coordination with other care providers 2. 24/7 contact phone numbers for assistance for urgent and routine care needs. 3. Service will only be billed when office clinical staff spend 20 minutes or more in a month to coordinate care. 4. Only one practitioner may furnish and bill the service in a calendar month. 5.The patient may stop CCM services at any time (effective at the end of the month) by phone call to the office staff. 6. The patient will be responsible for cost sharing (co-pay) of up to 20% of the service fee (after annual deductible is met). Patient agreed to services and consent obtained.  Patient agreed to services and verbal consent obtained.   Assessment: Review of patient past medical history, allergies, medications, health status, including review of consultants reports, laboratory and other test data, was performed as part of comprehensive evaluation and provision of chronic care management services.   SDOH (Social Determinants of Health) assessments and interventions performed:  see care plan   CCM Care Plan  No Known Allergies  Outpatient Encounter Medications as of  01/18/2021  Medication Sig  . bethanechol (URECHOLINE) 25 MG tablet TAKE 1 TABLET BY MOUTH THREE TIMES A DAY (Patient not taking: No sig reported)  . diclofenac sodium (VOLTAREN) 1 % GEL Apply 2 g 4 (four) times daily topically. (Patient not taking: Reported on 12/29/2020)  . docusate sodium (COLACE) 50 MG capsule Take 50 mg by mouth 2 (two) times daily. (Patient not taking: Reported on 12/29/2020)  . furosemide (LASIX) 20 MG tablet TAKE 1 TABLET BY MOUTH EVERY DAY  . gabapentin (NEURONTIN) 600 MG tablet TAKE 1 TABLET THREE TIMES DAILY (Patient taking differently: 2 (two) times daily.)  . GNP MAGNESIUM OXIDE 250 MG TABS TAKE 1 TABLET (250 MG TOTAL) BY MOUTH EVERY EVENING.  Marland Kitchen lidocaine (LIDODERM) 5 % Place 1 patch onto the skin daily. Remove & Discard patch within 12 hours or as directed by MD  . meloxicam (MOBIC) 7.5 MG tablet Take 1 tablet (7.5 mg total) by mouth daily.  . methocarbamol (ROBAXIN) 500 MG tablet TAKE 1 TABLET EVERY 8 HOURS AS NEEDED FOR MUSCLE SPASM(S)  . naphazoline-glycerin (CLEAR EYES) 0.012-0.2 % SOLN Place 2 drops 4 (four) times daily -  with meals and at bedtime into both eyes.  Marland Kitchen oxycodone-acetaminophen (PERCOCET) 2.5-325 MG tablet Take 1 tablet by mouth every 4 (four) hours as needed for pain. 5-3.25 per pt wife  . sertraline (ZOLOFT) 50 MG tablet Take 75 mg by mouth daily.  . tamsulosin (FLOMAX) 0.4 MG CAPS capsule TAKE 1 CAPSULE EVERY DAY   No facility-administered encounter medications on file as of 01/18/2021.    Patient Active Problem List   Diagnosis Date Noted  . Pseudomonas urinary tract infection   .  Neurogenic bladder   . Neck pain   . Chronic pain syndrome   . Transaminitis   . Diffuse TBI w loss of consciousness of unsp duration, init ([place]) 05/21/2017  . Tetraplegia ([place]) 05/21/2017  . Fracture   . Trauma   . Polysubstance abuse ([place])   . Fever   . Tachycardia   . Post-operative pain   . Leukocytosis   . SIRS (systemic inflammatory response syndrome)  ([facility])   . Acute blood loss anemia   . TBI (traumatic brain injury) ([place]) 05/03/2017  . Polysubstance abuse ([place]) 01/12/2017    Conditions to be addressed/monitored: Traumatic brain injury, 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, Chronic Pain Syndrome, Neurogenic bladder, Dysuria  Care Plan : Assist with Chronic Care Management and Care Coordination needs  Updates made by Lynne Logan, RN since 01/18/2021 12:00 AM    Problem: Assist with Chronic Care Management and Care Coordination needs   Priority: High    Long-Range Goal: Assist with Chronic Care Management and Care Coordination needs   Start Date: 01/18/2021  Expected End Date: 03/09/2021  This Visit's Progress: On track  Priority: High  Note:   Current Barriers:   Chronic Disease Management support, education, and care coordination needs related to Traumatic brain injury, 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, Chronic Pain Syndrome, Neurogenic bladder with RNCM, SW and Pharmacy Care Management and Care coordination needs Case Manager Clinical Goal(s):   Patient will work with BSW to address needs related to care coordination needs related to nephropathy in patient with Traumatic brain injury, 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, Chronic Pain Syndrome, Neurogenic bladder with RNCM, SW and Pharmacy Care Management and Care Coordination needs Interventions:   Collaboration with Glendale Chard, MD regarding development and update of comprehensive plan of care as evidenced by provider attestation and co-signature  Inter-disciplinary care team collaboration (see longitudinal plan of care)  Collaborated with BSW to initiate plan of care to address needs related to care coordination needs related to Traumatic brain injury, 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, Chronic Pain Syndrome, Neurogenic bladder Patient Goals/Self-Care Activities . Patient will  work with the CCM team to address care coordination needs and will continue to work with the clinical team to address health care and disease management related needs.    Follow Up Plan: The care management team will reach out to the patient again over the next 30 days.      Plan:Telephone follow up appointment with care management team member scheduled for:  02/21/21  Barb Merino, RN, BSN, CCM Care Management Coordinator [facility]  Direct Phone: [phone]

## 2021-01-18 NOTE — Chronic Care Management (AMB) (Signed)
Chronic Care Management   CCM RN Visit Note  01/18/2021 Name: Vincent Lammert Sr. MRN: [id number] DOB: Oct 24, 1965  Subjective: Vincent Mems Sr. is a 55 y.o. year old male who is a primary care patient of [name], [place]. The care management team was consulted for assistance with disease management and care coordination needs.    Collaboration with Daneen Schick BSW  for Case Collaboration  in response to provider referral for case management and/or care coordination services.   Consent to Services:  The patient was given the following information about Chronic Care Management services today, agreed to services, and gave verbal consent: 1. CCM service includes personalized support from designated clinical staff supervised by the primary care provider, including individualized plan of care and coordination with other care providers 2. 24/7 contact phone numbers for assistance for urgent and routine care needs. 3. Service will only be billed when office clinical staff spend 20 minutes or more in a month to coordinate care. 4. Only one practitioner may furnish and bill the service in a calendar month. 5.The patient may stop CCM services at any time (effective at the end of the month) by phone call to the office staff. 6. The patient will be responsible for cost sharing (co-pay) of up to 20% of the service fee (after annual deductible is met). Patient agreed to services and consent obtained.  Patient agreed to services and verbal consent obtained.   Assessment: Review of patient past medical history, allergies, medications, health status, including review of consultants reports, laboratory and other test data, was performed as part of comprehensive evaluation and provision of chronic care management services.   SDOH (Social Determinants of Health) assessments and interventions performed:  see care plan   CCM Care Plan  No Known Allergies  Outpatient Encounter Medications as of  01/18/2021  Medication Sig  . bethanechol (URECHOLINE) 25 MG tablet TAKE 1 TABLET BY MOUTH THREE TIMES A DAY (Patient not taking: No sig reported)  . diclofenac sodium (VOLTAREN) 1 % GEL Apply 2 g 4 (four) times daily topically. (Patient not taking: Reported on 12/29/2020)  . docusate sodium (COLACE) 50 MG capsule Take 50 mg by mouth 2 (two) times daily. (Patient not taking: Reported on 12/29/2020)  . furosemide (LASIX) 20 MG tablet TAKE 1 TABLET BY MOUTH EVERY DAY  . gabapentin (NEURONTIN) 600 MG tablet TAKE 1 TABLET THREE TIMES DAILY (Patient taking differently: 2 (two) times daily.)  . GNP MAGNESIUM OXIDE 250 MG TABS TAKE 1 TABLET (250 MG TOTAL) BY MOUTH EVERY EVENING.  Marland Kitchen lidocaine (LIDODERM) 5 % Place 1 patch onto the skin daily. Remove & Discard patch within 12 hours or as directed by MD  . meloxicam (MOBIC) 7.5 MG tablet Take 1 tablet (7.5 mg total) by mouth daily.  . methocarbamol (ROBAXIN) 500 MG tablet TAKE 1 TABLET EVERY 8 HOURS AS NEEDED FOR MUSCLE SPASM(S)  . naphazoline-glycerin (CLEAR EYES) 0.012-0.2 % SOLN Place 2 drops 4 (four) times daily -  with meals and at bedtime into both eyes.  Marland Kitchen oxycodone-acetaminophen (PERCOCET) 2.5-325 MG tablet Take 1 tablet by mouth every 4 (four) hours as needed for pain. 5-3.25 per pt wife  . sertraline (ZOLOFT) 50 MG tablet Take 75 mg by mouth daily.  . tamsulosin (FLOMAX) 0.4 MG CAPS capsule TAKE 1 CAPSULE EVERY DAY   No facility-administered encounter medications on file as of 01/18/2021.    Patient Active Problem List   Diagnosis Date Noted  . Pseudomonas urinary tract infection   .  Neurogenic bladder   . Neck pain   . Chronic pain syndrome   . Transaminitis   . Diffuse TBI w loss of consciousness of unsp duration, init ([place]) 05/21/2017  . Tetraplegia ([place]) 05/21/2017  . Fracture   . Trauma   . Polysubstance abuse ([place])   . Fever   . Tachycardia   . Post-operative pain   . Leukocytosis   . SIRS (systemic inflammatory response syndrome)  (HCC)   . Acute blood loss anemia   . TBI (traumatic brain injury) ([place]) 05/03/2017  . Polysubstance abuse ([place]) 01/12/2017    Conditions to be addressed/monitored:Traumatic brain injury, 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, Chronic Pain Syndrome, Neurogenic bladder, Dysuria, Chronic neck pain, Moderate episode of recurrent major depressive disorder   Care Plan : Assist with Chronic Care Management and Care Coordination needs  Updates made by Lynne Logan, RN since 01/18/2021 12:00 AM    Problem: Assist with Chronic Care Management and Care Coordination needs   Priority: High    Long-Range Goal: Assist with Chronic Care Management and Care Coordination needs   Start Date: 01/18/2021  Expected End Date: 03/09/2021  This Visit's Progress: On track  Priority: High  Note:   Current Barriers:   Chronic Disease Management support, education, and care coordination needs related to Traumatic brain injury, 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, Chronic Pain Syndrome, Neurogenic bladder, Dysuria, Chronic neck pain, Moderate episode of recurrent major depressive disorder  with RNCM, SW and Pharmacy Care Management and Care coordination needs Case Manager Clinical Goal(s):   Patient will work with BSW to address needs related to care coordination needs related to nephropathy in patient with Traumatic brain injury, 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, Chronic Pain Syndrome, Neurogenic bladder, Dysuria, Chronic neck pain, Moderate episode of recurrent major depressive disorder  with RNCM, SW and Pharmacy Care Management and Care Coordination needs Interventions:   Collaboration with [place] Chard, MD regarding development and update of comprehensive plan of care as evidenced by provider attestation and co-signature  Inter-disciplinary care team collaboration (see longitudinal plan of care)  Collaborated with BSW to initiate plan of  care to address needs related to care coordination needs related to Traumatic brain injury, 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, Chronic Pain Syndrome, Neurogenic bladder, Dysuria, Chronic neck pain, Moderate episode of recurrent major depressive disorder  Patient Goals/Self-Care Activities . Patient will work with the CCM team to address care coordination needs and will continue to work with the clinical team to address health care and disease management related needs.    Follow Up Plan: The care management team will reach out to the patient again over the next 30 days.      Plan:Telephone follow up appointment with care management team member scheduled for:  02/21/21  Barb Merino, RN, BSN, CCM Care Management Coordinator [place] [facility]  Direct Phone: [phone]

## 2021-01-19 NOTE — Patient Instructions (Signed)
Social Worker Visit Information  Goals we discussed today:  Goals Addressed            This Visit's Progress   . Barriers to treatment identified and managed       Timeframe:  Long-Range Goal Priority:  High Start Date:   5.11.22                          Expected End Date: 8.10.22                       Patient Goals/Self-Care Activities . patient will: With the help of his spouse  - Contact SW as needed prior to next scheduled call - Engage with RN Care Manager to develop an individualized plan of care        Materials provided: Verbal education about resources provided by phone  [name] was given information about Chronic Care Management services today including:  1. CCM service includes personalized support from designated clinical staff supervised by his physician, including individualized plan of care and coordination with other care providers 2. 24/7 contact phone numbers for assistance for urgent and routine care needs. 3. Service will only be billed when office clinical staff spend 20 minutes or more in a month to coordinate care. 4. Only one practitioner may furnish and bill the service in a calendar month. 5. The patient may stop CCM services at any time (effective at the end of the month) by phone call to the office staff. 6. The patient will be responsible for cost sharing (co-pay) of up to 20% of the service fee (after annual deductible is met).  Patient agreed to services and verbal consent obtained.   Patient verbalizes understanding of instructions provided today and agrees to view in [place].   Follow up plan: SW will follow up with patient by phone over the next 10 days   Daneen Schick, BSW, CDP Social Worker, Certified Dementia Practitioner [place] [facility] [place] [facility] [phone]

## 2021-01-19 NOTE — Chronic Care Management (AMB) (Signed)
Chronic Care Management    Social Work Note  01/18/2021 Name: Vincent Rohr Elliott. MRN: [id number] DOB: [birth year]  [name]. is a 55 y.o. year old male who is a primary care patient of [name], [place]. The CCM team was consulted to assist the patient with chronic disease management and/or care coordination needs related to: [employer] .   Engaged with patient spouse by phone for initial visit in response to provider referral for social work chronic care management and care coordination services.   Consent to Services:  The patient was given the following information about Chronic Care Management services today, agreed to services, and gave verbal consent: 1. CCM service includes personalized support from designated clinical staff supervised by the primary care provider, including individualized plan of care and coordination with other care providers 2. 24/7 contact phone numbers for assistance for urgent and routine care needs. 3. Service will only be billed when office clinical staff spend 20 minutes or more in a month to coordinate care. 4. Only one practitioner may furnish and bill the service in a calendar month. 5.The patient may stop CCM services at any time (effective at the end of the month) by phone call to the office staff. 6. The patient will be responsible for cost sharing (co-pay) of up to 20% of the service fee (after annual deductible is met). Patient agreed to services and consent obtained.  Patient agreed to services and consent obtained.   Assessment: Review of patient past medical history, allergies, medications, and health status, including review of relevant consultants reports was performed today as part of a comprehensive evaluation and provision of chronic care management and care coordination services.     SDOH (Social Determinants of Health) assessments and interventions performed:  SDOH Interventions   Flowsheet Row Most Recent Value   SDOH Interventions   Food Insecurity Interventions Intervention Not Indicated  Housing Interventions Intervention Not Indicated  Transportation Interventions SCAT (Specialized Community [employer]poration)  [Sent [employer] Info]       Advanced Directives Status: Not addressed in this encounter.  CCM Care Plan  No Known Allergies  Outpatient Encounter Medications as of 01/18/2021  Medication Sig  . bethanechol (URECHOLINE) 25 MG tablet TAKE 1 TABLET BY MOUTH THREE TIMES A DAY (Patient not taking: No sig reported)  . diclofenac sodium (VOLTAREN) 1 % GEL Apply 2 g 4 (four) times daily topically. (Patient not taking: Reported on 12/29/2020)  . docusate sodium (COLACE) 50 MG capsule Take 50 mg by mouth 2 (two) times daily. (Patient not taking: Reported on 12/29/2020)  . furosemide (LASIX) 20 MG tablet TAKE 1 TABLET BY MOUTH EVERY DAY  . gabapentin (NEURONTIN) 600 MG tablet TAKE 1 TABLET THREE TIMES DAILY (Patient taking differently: 2 (two) times daily.)  . GNP MAGNESIUM OXIDE 250 MG TABS TAKE 1 TABLET (250 MG TOTAL) BY MOUTH EVERY EVENING.  Marland Kitchen lidocaine (LIDODERM) 5 % Place 1 patch onto the skin daily. Remove & Discard patch within 12 hours or as directed by MD  . meloxicam (MOBIC) 7.5 MG tablet Take 1 tablet (7.5 mg total) by mouth daily.  . methocarbamol (ROBAXIN) 500 MG tablet TAKE 1 TABLET EVERY 8 HOURS AS NEEDED FOR MUSCLE SPASM(S)  . naphazoline-glycerin (CLEAR EYES) 0.012-0.2 % SOLN Place 2 drops 4 (four) times daily -  with meals and at bedtime into both eyes.  Marland Kitchen oxycodone-acetaminophen (PERCOCET) 2.5-325 MG tablet Take 1 tablet by mouth every 4 (four) hours as needed for pain. 5-3.25  per pt wife  . sertraline (ZOLOFT) 50 MG tablet Take 75 mg by mouth daily.  . tamsulosin (FLOMAX) 0.4 MG CAPS capsule TAKE 1 CAPSULE EVERY DAY   No facility-administered encounter medications on file as of 01/18/2021.    Patient Active Problem List   Diagnosis Date Noted  . Pseudomonas urinary tract  infection   . Neurogenic bladder   . Neck pain   . Chronic pain syndrome   . Transaminitis   . Diffuse TBI w loss of consciousness of unsp duration, init ([place]) 05/21/2017  . Tetraplegia ([place]) 05/21/2017  . Fracture   . Trauma   . Polysubstance abuse ([place])   . Fever   . Tachycardia   . Post-operative pain   . Leukocytosis   . SIRS (systemic inflammatory response syndrome) (HCC)   . Acute blood loss anemia   . TBI (traumatic brain injury) ([place]) 05/03/2017  . Polysubstance abuse ([place]) 01/12/2017    Conditions to be addressed/monitored: Traumatic Brain Injury, Chronic Pain Syndrome, Neurogenic Bladder, Moderate Episode of Recurrent Major Depressive Disorder; Transportation and Limited access to caregiver  Care Plan : Social Work [facility] Care Plan  Updates made by Daneen Schick since 01/19/2021 12:00 AM    Problem: Barriers to Treatment     Long-Range Goal: Barriers to Treatment Identified and Managed   Start Date: 01/18/2021  Expected End Date: 04/19/2021  Priority: High  Note:   Current Barriers:  . Chronic disease management support and education needs related to  Traumatic Brain Injury, Chronic Pain Syndrome, Neurogenic Bladder, and Moderate Episode of Recurrent Major Depressive Disorder   . Transportation . Limited access to a caregiver Social Worker Clinical Goal(s):  Marland Kitchen Over the next 90 days the patient will work with SW to identify and address any acute and/or chronic care coordination needs related to the self health management of  Traumatic Brain Injury, Chronic Pain Syndrome, Neurogenic Bladder, and Moderate Episode of Recurrent Major Depressive Disorder   . Over the next 45 days the patient and his spouse will work with SW to identify resources to assist with transportation . Over the next 90 days the patient and his spouse will identify caregiver resource options SW Interventions:  . Inter-disciplinary care team collaboration (see longitudinal plan of care) . Collaboration  with [name], FNP regarding development and update of comprehensive plan of care as evidenced by provider attestation and co-signature . Successful outbound call placed to the patients spouse [name] to conduct a SW screen . Discussed the patient is in need of a caregiver to assist with ADL's and iADL's while [name] is at work - she works M-TH 2-10pm and Friday 9a-5p) . [name] reports their two adult daughters assist some as needed but are not reliable . The patient does have a walker and wheelchair in the home, he is able to perform some meal preparation but cannot stand for long periods of time due to pain, he needs assistance with bathing . Educated [place] on [place] in home aid program - unsuccessful call placed to [employer] to place the patient on the wait list . Determined the patients individual income is within range to receive Medicaid benefit - will research ability to apply for CAP/DA . Discussed the patient is in need of transportation resources to assist when [name] is unavailable . Provided verbal and written education on [place] benefit . Discussed ability to apply for SCAT . Determined the patient did complete a SCAT application in the past, [name] is unsure  if the patient has this benefit . Collaboration with Almedia Balls with SCAT to determine patients status . Collaboration with [place] to discuss interventions and plan . SW will follow up with the patient and his spouse over the next 10 days  Patient Goals/Self-Care Activities . patient will: With the help of his spouse  -  Contact SW as needed prior to next scheduled call - Engage with RN Care Manager to develop an individualized plan of care Follow Up Plan:  SW will follow up with the patient over the next 10 days       Follow Up Plan: SW will follow up with patient by phone over the next 10 days      [place], CDP Social Worker, Certified Dementia  Practitioner [place] / [place] [facility] [phone]  Total time spent performing care coordination and/or care management activities with the patient by phone or face to face = 44 minutes.

## 2021-01-20 DIAGNOSIS — M5412 Radiculopathy, cervical region: Secondary | ICD-10-CM | POA: Diagnosis not present

## 2021-01-20 DIAGNOSIS — M961 Postlaminectomy syndrome, not elsewhere classified: Secondary | ICD-10-CM | POA: Diagnosis not present

## 2021-01-20 DIAGNOSIS — M4722 Other spondylosis with radiculopathy, cervical region: Secondary | ICD-10-CM | POA: Diagnosis not present

## 2021-01-20 DIAGNOSIS — M50122 Cervical disc disorder at C5-C6 level with radiculopathy: Secondary | ICD-10-CM | POA: Diagnosis not present

## 2021-01-23 ENCOUNTER — Ambulatory Visit: Payer: Medicare HMO

## 2021-01-23 DIAGNOSIS — F331 Major depressive disorder, recurrent, moderate: Secondary | ICD-10-CM

## 2021-01-23 DIAGNOSIS — S069X3S Unspecified intracranial injury 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, sequela: Secondary | ICD-10-CM

## 2021-01-23 DIAGNOSIS — N319 Neuromuscular dysfunction of bladder, unspecified: Secondary | ICD-10-CM | POA: Diagnosis not present

## 2021-01-23 DIAGNOSIS — G894 Chronic pain syndrome: Secondary | ICD-10-CM

## 2021-01-23 DIAGNOSIS — M961 Postlaminectomy syndrome, not elsewhere classified: Secondary | ICD-10-CM | POA: Diagnosis not present

## 2021-01-23 DIAGNOSIS — M4722 Other spondylosis with radiculopathy, cervical region: Secondary | ICD-10-CM | POA: Diagnosis not present

## 2021-01-23 DIAGNOSIS — Z01812 Encounter for preprocedural laboratory examination: Secondary | ICD-10-CM | POA: Diagnosis not present

## 2021-01-23 NOTE — Patient Instructions (Signed)
Social Worker Visit Information  Goals we discussed today:  Goals Addressed            This Visit's Progress   . Barriers to treatment identified and managed       Timeframe:  Long-Range Goal Priority:  High Start Date:   5.11.22                          Expected End Date: 8.10.22             Next planned outreach: 5.31.22            Patient Goals/Self-Care Activities . patient will: With the help of his spouse  - Contact SW as needed prior to next scheduled call - Engage with RN Care Manager to develop an individualized plan of care        Materials Provided: No. Patient not reached.  Follow Up Plan: SW will follow up with patient by phone over the next two weeks   Daneen Schick, BSW, CDP Social Worker, Certified Dementia Practitioner [place] / [place] [facility] [phone]

## 2021-01-23 NOTE — Chronic Care Management (AMB) (Signed)
Chronic Care Management    Social Work Note  01/23/2021 Name: Aadvik Roker Sr. MRN: [id number] DOB: 04/17/1966  [name] is a 55 y.o. year old male who is a primary care patient of [name], [place]. The CCM team was consulted to assist the patient with chronic disease management and/or care coordination needs related to: [employer] .   Collaboration with community resources for coordination of resources in response to provider referral for social work chronic care management and care coordination services.   Consent to Services:  The patient was given information about Chronic Care Management services, agreed to services, and gave verbal consent prior to initiation of services.  Please see initial visit note for detailed documentation.   Patient agreed to services and consent obtained.   Assessment: Review of patient past medical history, allergies, medications, and health status, including review of relevant consultants reports was performed today as part of a comprehensive evaluation and provision of chronic care management and care coordination services.     SDOH (Social Determinants of Health) assessments and interventions performed:    Advanced Directives Status: Not addressed in this encounter.  CCM Care Plan  No Known Allergies  Outpatient Encounter Medications as of 01/23/2021  Medication Sig  . bethanechol (URECHOLINE) 25 MG tablet TAKE 1 TABLET BY MOUTH THREE TIMES A DAY (Patient not taking: No sig reported)  . diclofenac sodium (VOLTAREN) 1 % GEL Apply 2 g 4 (four) times daily topically. (Patient not taking: Reported on 12/29/2020)  . docusate sodium (COLACE) 50 MG capsule Take 50 mg by mouth 2 (two) times daily. (Patient not taking: Reported on 12/29/2020)  . furosemide (LASIX) 20 MG tablet TAKE 1 TABLET BY MOUTH EVERY DAY  . gabapentin (NEURONTIN) 600 MG tablet TAKE 1 TABLET THREE TIMES DAILY (Patient taking differently: 2 (two) times  daily.)  . GNP MAGNESIUM OXIDE 250 MG TABS TAKE 1 TABLET (250 MG TOTAL) BY MOUTH EVERY EVENING.  Marland Kitchen lidocaine (LIDODERM) 5 % Place 1 patch onto the skin daily. Remove & Discard patch within 12 hours or as directed by MD  . meloxicam (MOBIC) 7.5 MG tablet Take 1 tablet (7.5 mg total) by mouth daily.  . methocarbamol (ROBAXIN) 500 MG tablet TAKE 1 TABLET EVERY 8 HOURS AS NEEDED FOR MUSCLE SPASM(S)  . naphazoline-glycerin (CLEAR EYES) 0.012-0.2 % SOLN Place 2 drops 4 (four) times daily -  with meals and at bedtime into both eyes.  Marland Kitchen oxycodone-acetaminophen (PERCOCET) 2.5-325 MG tablet Take 1 tablet by mouth every 4 (four) hours as needed for pain. 5-3.25 per pt wife  . sertraline (ZOLOFT) 50 MG tablet Take 75 mg by mouth daily.  . tamsulosin (FLOMAX) 0.4 MG CAPS capsule TAKE 1 CAPSULE EVERY DAY   No facility-administered encounter medications on file as of 01/23/2021.    Patient Active Problem List   Diagnosis Date Noted  . Pseudomonas urinary tract infection   . Neurogenic bladder   . Neck pain   . Chronic pain syndrome   . Transaminitis   . Diffuse TBI w loss of consciousness of unsp duration, init ([place]) 05/21/2017  . Tetraplegia ([place]) 05/21/2017  . Fracture   . Trauma   . Polysubstance abuse ([place])   . Fever   . Tachycardia   . Post-operative pain   . Leukocytosis   . SIRS (systemic inflammatory response syndrome) (HCC)   . Acute blood loss anemia   . TBI (traumatic brain injury) ([place]) 05/03/2017  . Polysubstance abuse ([place]) 01/12/2017  Conditions to be addressed/monitored: TBI, Chronic Pain Syndrome, Neurogenic Bladder, Moderate Episode of Recurrent Major Depressive Disorder; Transportation and Limited access to caregiver  Care Plan : Social Work [place]  Updates made by Daneen Schick since 01/23/2021 12:00 AM    Problem: Barriers to Treatment     Long-Range Goal: Barriers to Treatment Identified and Managed   Start Date: 01/18/2021  Expected End Date: 04/19/2021   This Visit's Progress: On track  Priority: High  Note:   Current Barriers:  . Chronic disease management support and education needs related to  Traumatic Brain Injury, Chronic Pain Syndrome, Neurogenic Bladder, and Moderate Episode of Recurrent Major Depressive Disorder   . Transportation . Limited access to a caregiver Social Worker Clinical Goal(s):  Marland Kitchen Over the next 90 days the patient will work with SW to identify and address any acute and/or chronic care coordination needs related to the self health management of  Traumatic Brain Injury, Chronic Pain Syndrome, Neurogenic Bladder, and Moderate Episode of Recurrent Major Depressive Disorder   . Over the next 45 days the patient and his spouse will work with SW to identify resources to assist with transportation . Over the next 90 days the patient and his spouse will identify caregiver resource options SW Interventions:  . Inter-disciplinary care team collaboration (see longitudinal plan of care) . Collaboration with [name], FNP regarding development and update of comprehensive plan of care as evidenced by provider attestation and co-signature . Collaboration with Almedia Balls with SCAT to determine patient is not currently approved to access services . Initiation of SCAT application; collaboration with patients primary provider to request Part B be completed . Inbound call from [place] with in home aide program - successfully placed the patient on the wait list  . Collaboration with Mrs. Larence Penning with CAP/DA program to request feedback if the patient is eligible to apply for the program . SW to follow up with Mrs. [place] over the next two weeks  Patient Goals/Self-Care Activities . patient will: With the help of his spouse  -  Contact SW as needed prior to next scheduled call - Engage with RN Care Manager to develop an individualized plan of care Follow Up Plan:  SW will follow up with the patient over the next 14 days        Follow Up Plan: SW will follow up with patient by phone over the next 14 days      Daneen Schick, BSW, CDP Social Worker, Certified Dementia Practitioner [place] / [place] [facility] [phone]  Total time spent performing care coordination and/or care management activities with the patient by phone or face to face = 35 minutes.

## 2021-01-25 DIAGNOSIS — Z981 Arthrodesis status: Secondary | ICD-10-CM | POA: Diagnosis not present

## 2021-01-25 DIAGNOSIS — M4802 Spinal stenosis, cervical region: Secondary | ICD-10-CM | POA: Diagnosis not present

## 2021-01-25 DIAGNOSIS — M4322 Fusion of spine, cervical region: Secondary | ICD-10-CM | POA: Diagnosis not present

## 2021-01-25 DIAGNOSIS — M961 Postlaminectomy syndrome, not elsewhere classified: Secondary | ICD-10-CM | POA: Diagnosis not present

## 2021-01-25 DIAGNOSIS — Z930 Tracheostomy status: Secondary | ICD-10-CM | POA: Diagnosis not present

## 2021-01-25 DIAGNOSIS — M5412 Radiculopathy, cervical region: Secondary | ICD-10-CM | POA: Diagnosis not present

## 2021-01-25 DIAGNOSIS — M50122 Cervical disc disorder at C5-C6 level with radiculopathy: Secondary | ICD-10-CM | POA: Diagnosis not present

## 2021-01-25 DIAGNOSIS — M4722 Other spondylosis with radiculopathy, cervical region: Secondary | ICD-10-CM | POA: Diagnosis not present

## 2021-01-25 DIAGNOSIS — Z01818 Encounter for other preprocedural examination: Secondary | ICD-10-CM | POA: Diagnosis not present

## 2021-01-26 DIAGNOSIS — M961 Postlaminectomy syndrome, not elsewhere classified: Secondary | ICD-10-CM | POA: Diagnosis not present

## 2021-01-26 DIAGNOSIS — M4722 Other spondylosis with radiculopathy, cervical region: Secondary | ICD-10-CM | POA: Diagnosis not present

## 2021-01-26 DIAGNOSIS — M4802 Spinal stenosis, cervical region: Secondary | ICD-10-CM | POA: Diagnosis not present

## 2021-01-26 DIAGNOSIS — M4322 Fusion of spine, cervical region: Secondary | ICD-10-CM | POA: Diagnosis not present

## 2021-01-26 DIAGNOSIS — Z9889 Other specified postprocedural states: Secondary | ICD-10-CM | POA: Diagnosis not present

## 2021-01-26 DIAGNOSIS — M47812 Spondylosis without myelopathy or radiculopathy, cervical region: Secondary | ICD-10-CM | POA: Diagnosis not present

## 2021-01-26 DIAGNOSIS — Z981 Arthrodesis status: Secondary | ICD-10-CM | POA: Diagnosis not present

## 2021-01-26 DIAGNOSIS — M50122 Cervical disc disorder at C5-C6 level with radiculopathy: Secondary | ICD-10-CM | POA: Diagnosis not present

## 2021-01-26 DIAGNOSIS — Z930 Tracheostomy status: Secondary | ICD-10-CM | POA: Diagnosis not present

## 2021-02-02 ENCOUNTER — Encounter: Payer: Self-pay | Admitting: Nurse Practitioner

## 2021-02-02 ENCOUNTER — Ambulatory Visit: Payer: Self-pay

## 2021-02-02 DIAGNOSIS — R339 Retention of urine, unspecified: Secondary | ICD-10-CM | POA: Diagnosis not present

## 2021-02-02 DIAGNOSIS — G894 Chronic pain syndrome: Secondary | ICD-10-CM

## 2021-02-02 DIAGNOSIS — S069X3S Unspecified intracranial injury 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, sequela: Secondary | ICD-10-CM

## 2021-02-02 DIAGNOSIS — F331 Major depressive disorder, recurrent, moderate: Secondary | ICD-10-CM

## 2021-02-02 NOTE — Chronic Care Management (AMB) (Signed)
Chronic Care Management    Social Work Note  02/02/2021 Name: Vincent Bogan Sr. MRN: [id number] DOB: 09-23-1965  Vincent Mems Sr. is a 55 y.o. year old male who is a primary care patient of [name], [place]. The CCM team was consulted to assist the patient with chronic disease management and/or care coordination needs related to: [employer] .   Collaboration with [employer]  for submission of SCAT application in response to provider referral for social work chronic care management and care coordination services.   Consent to Services:  The patient was given information about Chronic Care Management services, agreed to services, and gave verbal consent prior to initiation of services.  Please see initial visit note for detailed documentation.   Patient agreed to services and consent obtained.   Assessment: Review of patient past medical history, allergies, medications, and health status, including review of relevant consultants reports was performed today as part of a comprehensive evaluation and provision of chronic care management and care coordination services.     SDOH (Social Determinants of Health) assessments and interventions performed:    Advanced Directives Status: Not addressed in this encounter.  CCM Care Plan  No Known Allergies  Outpatient Encounter Medications as of 02/02/2021  Medication Sig  . bethanechol (URECHOLINE) 25 MG tablet TAKE 1 TABLET BY MOUTH THREE TIMES A DAY (Patient not taking: No sig reported)  . diclofenac sodium (VOLTAREN) 1 % GEL Apply 2 g 4 (four) times daily topically. (Patient not taking: Reported on 12/29/2020)  . docusate sodium (COLACE) 50 MG capsule Take 50 mg by mouth 2 (two) times daily. (Patient not taking: Reported on 12/29/2020)  . furosemide (LASIX) 20 MG tablet TAKE 1 TABLET BY MOUTH EVERY DAY  . gabapentin (NEURONTIN) 600 MG tablet TAKE 1 TABLET THREE TIMES DAILY (Patient taking differently: 2 (two) times  daily.)  . GNP MAGNESIUM OXIDE 250 MG TABS TAKE 1 TABLET (250 MG TOTAL) BY MOUTH EVERY EVENING.  Marland Kitchen lidocaine (LIDODERM) 5 % Place 1 patch onto the skin daily. Remove & Discard patch within 12 hours or as directed by MD  . meloxicam (MOBIC) 7.5 MG tablet Take 1 tablet (7.5 mg total) by mouth daily.  . methocarbamol (ROBAXIN) 500 MG tablet TAKE 1 TABLET EVERY 8 HOURS AS NEEDED FOR MUSCLE SPASM(S)  . naphazoline-glycerin (CLEAR EYES) 0.012-0.2 % SOLN Place 2 drops 4 (four) times daily -  with meals and at bedtime into both eyes.  Marland Kitchen oxycodone-acetaminophen (PERCOCET) 2.5-325 MG tablet Take 1 tablet by mouth every 4 (four) hours as needed for pain. 5-3.25 per pt wife  . sertraline (ZOLOFT) 50 MG tablet Take 75 mg by mouth daily.  . tamsulosin (FLOMAX) 0.4 MG CAPS capsule TAKE 1 CAPSULE EVERY DAY   No facility-administered encounter medications on file as of 02/02/2021.    Patient Active Problem List   Diagnosis Date Noted  . Pseudomonas urinary tract infection   . Neurogenic bladder   . Neck pain   . Chronic pain syndrome   . Transaminitis   . Diffuse TBI w loss of consciousness of unsp duration, init ([place]) 05/21/2017  . Tetraplegia ([place]) 05/21/2017  . Fracture   . Trauma   . Polysubstance abuse ([place])   . Fever   . Tachycardia   . Post-operative pain   . Leukocytosis   . SIRS (systemic inflammatory response syndrome) (HCC)   . Acute blood loss anemia   . TBI (traumatic brain injury) ([place]) 05/03/2017  . Polysubstance abuse (  [place]) 01/12/2017    Conditions to be addressed/monitored: Traumatic Brain Injury, Chronic Pain Syndrome, and Moderate Episode of recurrent Major Depressive Disorder; Transportation and Limited access to caregiver  Care Plan : Social Work [facility]  Outpatient Surgery Care Plan  Updates made by Daneen Schick since 02/02/2021 12:00 AM    Problem: Barriers to Treatment     Long-Range Goal: Barriers to Treatment Identified and Managed   Start Date: 01/18/2021  Expected End Date: 04/19/2021   Recent Progress: On track  Priority: High  Note:   Current Barriers:  . Chronic disease management support and education needs related to  Traumatic Brain Injury, Chronic Pain Syndrome, Neurogenic Bladder, and Moderate Episode of Recurrent Major Depressive Disorder   . Transportation . Limited access to a caregiver Social Worker Clinical Goal(s):  Marland Kitchen Over the next 90 days the patient will work with SW to identify and address any acute and/or chronic care coordination needs related to the self health management of  Traumatic Brain Injury, Chronic Pain Syndrome, Neurogenic Bladder, and Moderate Episode of Recurrent Major Depressive Disorder   . Over the next 45 days the patient and his spouse will work with SW to identify resources to assist with transportation . Over the next 90 days the patient and his spouse will identify caregiver resource options SW Interventions:  . Inter-disciplinary care team collaboration (see longitudinal plan of care) . Collaboration with [name], FNP regarding development and update of comprehensive plan of care as evidenced by provider attestation and co-signature . Collaboration with patient primary provider to obtain completed part B of SCAT application . Submitted SCAT application to [employer] with [place]  Patient Goals/Self-Care Activities . patient will: With the help of his spouse  -  Contact SW as needed prior to next scheduled call - Engage with RN Care Manager to develop an individualized plan of care Follow Up Plan:  SW will follow up with the patient over the next 14 days       Follow Up Plan: SW will follow up with patient by phone over the next week      Daneen Schick, BSW, CDP Social Worker, Certified Dementia Practitioner [place] [facility] [place] [facility] [phone]  Total time spent performing care coordination and/or care management activities with the patient by phone or face to face = 10 minutes.

## 2021-02-03 DIAGNOSIS — H52209 Unspecified astigmatism, unspecified eye: Secondary | ICD-10-CM | POA: Diagnosis not present

## 2021-02-03 DIAGNOSIS — H5213 Myopia, bilateral: Secondary | ICD-10-CM | POA: Diagnosis not present

## 2021-02-03 DIAGNOSIS — H524 Presbyopia: Secondary | ICD-10-CM | POA: Diagnosis not present

## 2021-02-07 ENCOUNTER — Ambulatory Visit: Payer: Medicare HMO

## 2021-02-07 DIAGNOSIS — N319 Neuromuscular dysfunction of bladder, unspecified: Secondary | ICD-10-CM | POA: Diagnosis not present

## 2021-02-07 DIAGNOSIS — F331 Major depressive disorder, recurrent, moderate: Secondary | ICD-10-CM

## 2021-02-07 DIAGNOSIS — G894 Chronic pain syndrome: Secondary | ICD-10-CM

## 2021-02-07 DIAGNOSIS — S069X3S Unspecified intracranial injury 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, sequela: Secondary | ICD-10-CM

## 2021-02-07 NOTE — Chronic Care Management (AMB) (Signed)
Chronic Care Management    Social Work Note  02/07/2021 Name: Avrey Hyser Sr. MRN: [id number] DOB: 08-25-1966  [name] is a 55 y.o. year old male who is a primary care patient of [name], [place]. The CCM team was consulted to assist the patient with chronic disease management and/or care coordination needs related to: [employer] .   Collaboration with Cleatis Polka for CAP referral in response to provider referral for social work chronic care management and care coordination services.   Consent to Services:  The patient was given information about Chronic Care Management services, agreed to services, and gave verbal consent prior to initiation of services.  Please see initial visit note for detailed documentation.   Patient agreed to services and consent obtained.   Assessment: Review of patient past medical history, allergies, medications, and health status, including review of relevant consultants reports was performed today as part of a comprehensive evaluation and provision of chronic care management and care coordination services.     SDOH (Social Determinants of Health) assessments and interventions performed:    Advanced Directives Status: Not addressed in this encounter.  CCM Care Plan  No Known Allergies  Outpatient Encounter Medications as of 02/07/2021  Medication Sig  . bethanechol (URECHOLINE) 25 MG tablet TAKE 1 TABLET BY MOUTH THREE TIMES A DAY (Patient not taking: No sig reported)  . diclofenac sodium (VOLTAREN) 1 % GEL Apply 2 g 4 (four) times daily topically. (Patient not taking: Reported on 12/29/2020)  . docusate sodium (COLACE) 50 MG capsule Take 50 mg by mouth 2 (two) times daily. (Patient not taking: Reported on 12/29/2020)  . furosemide (LASIX) 20 MG tablet TAKE 1 TABLET BY MOUTH EVERY DAY  . gabapentin (NEURONTIN) 600 MG tablet TAKE 1 TABLET THREE TIMES DAILY (Patient taking differently: 2 (two) times daily.)  . GNP  MAGNESIUM OXIDE 250 MG TABS TAKE 1 TABLET (250 MG TOTAL) BY MOUTH EVERY EVENING.  Marland Kitchen lidocaine (LIDODERM) 5 % Place 1 patch onto the skin daily. Remove & Discard patch within 12 hours or as directed by MD  . meloxicam (MOBIC) 7.5 MG tablet Take 1 tablet (7.5 mg total) by mouth daily.  . methocarbamol (ROBAXIN) 500 MG tablet TAKE 1 TABLET EVERY 8 HOURS AS NEEDED FOR MUSCLE SPASM(S)  . naphazoline-glycerin (CLEAR EYES) 0.012-0.2 % SOLN Place 2 drops 4 (four) times daily -  with meals and at bedtime into both eyes.  Marland Kitchen oxycodone-acetaminophen (PERCOCET) 2.5-325 MG tablet Take 1 tablet by mouth every 4 (four) hours as needed for pain. 5-3.25 per pt wife  . sertraline (ZOLOFT) 50 MG tablet Take 75 mg by mouth daily.  . tamsulosin (FLOMAX) 0.4 MG CAPS capsule TAKE 1 CAPSULE EVERY DAY   No facility-administered encounter medications on file as of 02/07/2021.    Patient Active Problem List   Diagnosis Date Noted  . Pseudomonas urinary tract infection   . Neurogenic bladder   . Neck pain   . Chronic pain syndrome   . Transaminitis   . Diffuse TBI w loss of consciousness of unsp duration, init ([place]) 05/21/2017  . Tetraplegia ([place]) 05/21/2017  . Fracture   . Trauma   . Polysubstance abuse ([place])   . Fever   . Tachycardia   . Post-operative pain   . Leukocytosis   . SIRS (systemic inflammatory response syndrome) (HCC)   . Acute blood loss anemia   . TBI (traumatic brain injury) ([place]) 05/03/2017  . Polysubstance abuse ([place]) 01/12/2017  Conditions to be addressed/monitored: traumatic brain injury, chronic pain syndrome, neurogenic bladder, moderate episode of recurrent major depressive disorder; Transportation and Limited access to caregiver  Care Plan : Social Work [place]  Updates made by Daneen Schick since 02/07/2021 12:00 AM    Problem: Barriers to Treatment     Long-Range Goal: Barriers to Treatment Identified and Managed   Start Date: 01/18/2021  Expected End Date: 04/19/2021   This Visit's Progress: On track  Recent Progress: On track  Priority: High  Note:   Current Barriers:  . Chronic disease management support and education needs related to  Traumatic Brain Injury, Chronic Pain Syndrome, Neurogenic Bladder, and Moderate Episode of Recurrent Major Depressive Disorder   . Transportation . Limited access to a caregiver Social Worker Clinical Goal(s):  Marland Kitchen Over the next 90 days the patient will work with SW to identify and address any acute and/or chronic care coordination needs related to the self health management of  Traumatic Brain Injury, Chronic Pain Syndrome, Neurogenic Bladder, and Moderate Episode of Recurrent Major Depressive Disorder   . Over the next 45 days the patient and his spouse will work with SW to identify resources to assist with transportation . Over the next 90 days the patient and his spouse will identify caregiver resource options SW Interventions:  . Inter-disciplinary care team collaboration (see longitudinal plan of care) . Collaboration with [name], FNP regarding development and update of comprehensive plan of care as evidenced by provider attestation and co-signature . Successful outbound call placed to Cleatis Polka with CAP program . Referred patient to CAP, informed by Nira Conn she will submit the referral to the state and the patient can expect to receive a packet in the mail over the next two weeks for completion . Discussed the patient would need to return completed packet with three possible case management programs selected- if the patients application is approved it will be sent to the top choice for an assessment to be completed . Unsuccessful outbound call placed to the patients wife to review information on referral, voice message left requesting a return call.  Patient Goals/Self-Care Activities . patient will: With the help of his spouse  -  Contact SW as needed prior to next scheduled call - Engage with RN Care  Manager to develop an individualized plan of care -Complete CAP application packet Follow Up Plan:  SW will follow up with the patient over the next 14 days       Follow Up Plan: SW will follow up with patient by phone over the next 14 days.      Daneen Schick, BSW, CDP Social Worker, Certified Dementia Practitioner [place] [facility] [place] [facility] [phone]  Total time spent performing care coordination and/or care management activities with the patient by phone or face to face = 21 minutes.

## 2021-02-09 DIAGNOSIS — Z87820 Personal history of traumatic brain injury: Secondary | ICD-10-CM | POA: Diagnosis not present

## 2021-02-09 DIAGNOSIS — M79605 Pain in left leg: Secondary | ICD-10-CM | POA: Diagnosis not present

## 2021-02-09 DIAGNOSIS — M545 Low back pain, unspecified: Secondary | ICD-10-CM | POA: Diagnosis not present

## 2021-02-09 DIAGNOSIS — M542 Cervicalgia: Secondary | ICD-10-CM | POA: Diagnosis not present

## 2021-02-09 DIAGNOSIS — Z79899 Other long term (current) drug therapy: Secondary | ICD-10-CM | POA: Diagnosis not present

## 2021-02-14 ENCOUNTER — Other Ambulatory Visit: Payer: Self-pay | Admitting: Nurse Practitioner

## 2021-02-14 DIAGNOSIS — R600 Localized edema: Secondary | ICD-10-CM

## 2021-02-16 ENCOUNTER — Telehealth: Payer: Self-pay

## 2021-02-16 ENCOUNTER — Telehealth: Payer: Medicare HMO

## 2021-02-16 NOTE — Telephone Encounter (Signed)
  Care Management   Follow Up Note   02/16/2021 Name: Vincent Sinkfield Sr. MRN: [id number] DOB: [birth year]   Referred by: [name], FNP Reason for referral : Chronic Care Management (Unsuccessful call)   A second unsuccessful telephone outreach was attempted today. The patient was referred to the case management team for assistance with care management and care coordination. SW left a HIPAA compliant voice message requesting a return call.  Follow Up Plan: The care management team will reach out to the patient again over the next 30 days.   Daneen Schick, BSW, CDP Social Worker, Certified Dementia Practitioner [place] [facility] [place] [facility] [phone]

## 2021-02-16 NOTE — Patient Instructions (Signed)
Social Worker Visit Information  Goals we discussed today:   Goals Addressed             This Visit's Progress    Barriers to treatment identified and managed       Timeframe:  Long-Range Goal Priority:  High Start Date:   5.11.22                          Expected End Date: 8.10.22             Next planned outreach: 6.9.22            Patient Goals/Self-Care Activities patient will: With the help of his spouse  - Contact SW as needed prior to next scheduled call - Engage with RN Care Manager to develop an individualized plan of care -Complete CAP application packet          Materials Provided: No. Patient not reached.  Follow Up Plan: SW will follow up with patient by phone over the next 14 days   Daneen Schick, BSW, CDP Social Worker, Certified Dementia Practitioner [place] [facility] [place] [facility] [phone]

## 2021-02-21 ENCOUNTER — Telehealth: Payer: Self-pay

## 2021-02-21 ENCOUNTER — Telehealth: Payer: Medicare HMO

## 2021-02-21 NOTE — Telephone Encounter (Signed)
  Care Management   Follow Up Note   02/21/2021 Name: Vincent Rodier Sr. MRN: [id number] DOB: 1966-02-22   Referred by: [name], FNP Reason for referral : Chronic Care Management (Initial RNCM Outreach - 1st attempt )   An unsuccessful telephone outreach was attempted today. The patient was referred to the case management team for assistance with care management and care coordination.   Follow Up Plan: A HIPPA compliant phone message was left for the patient providing contact information and requesting a return call.   Barb Merino, RN, BSN, CCM Care Management Coordinator [place] [facility]  Direct Phone: [phone]

## 2021-02-27 ENCOUNTER — Other Ambulatory Visit: Payer: Self-pay | Admitting: Nurse Practitioner

## 2021-02-27 DIAGNOSIS — R600 Localized edema: Secondary | ICD-10-CM

## 2021-02-27 DIAGNOSIS — M4322 Fusion of spine, cervical region: Secondary | ICD-10-CM | POA: Diagnosis not present

## 2021-03-02 ENCOUNTER — Encounter: Payer: Self-pay | Admitting: Nurse Practitioner

## 2021-03-02 ENCOUNTER — Ambulatory Visit (INDEPENDENT_AMBULATORY_CARE_PROVIDER_SITE_OTHER): Payer: Medicare HMO | Admitting: Nurse Practitioner

## 2021-03-02 ENCOUNTER — Other Ambulatory Visit: Payer: Self-pay

## 2021-03-02 ENCOUNTER — Encounter: Payer: Medicare HMO | Admitting: Nurse Practitioner

## 2021-03-02 VITALS — BP 114/80 | HR 72 | Temp 98.3°F

## 2021-03-02 DIAGNOSIS — K029 Dental caries, unspecified: Secondary | ICD-10-CM

## 2021-03-02 DIAGNOSIS — F331 Major depressive disorder, recurrent, moderate: Secondary | ICD-10-CM

## 2021-03-02 DIAGNOSIS — M542 Cervicalgia: Secondary | ICD-10-CM

## 2021-03-02 DIAGNOSIS — Z8659 Personal history of other mental and behavioral disorders: Secondary | ICD-10-CM

## 2021-03-02 DIAGNOSIS — M79605 Pain in left leg: Secondary | ICD-10-CM | POA: Diagnosis not present

## 2021-03-02 DIAGNOSIS — Z23 Encounter for immunization: Secondary | ICD-10-CM | POA: Diagnosis not present

## 2021-03-02 DIAGNOSIS — S81802D Unspecified open wound, left lower leg, subsequent encounter: Secondary | ICD-10-CM | POA: Diagnosis not present

## 2021-03-02 MED ORDER — KETOROLAC TROMETHAMINE 60 MG/2ML IM SOLN
60.0000 mg | Freq: Once | INTRAMUSCULAR | Status: AC
  Administered 2021-03-02: 60 mg via INTRAMUSCULAR

## 2021-03-02 MED ORDER — SHINGRIX 50 MCG/0.5ML IM SUSR: 0.5000 mL | Freq: Once | INTRAMUSCULAR | 0 refills | Status: AC
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
## 2021-03-08 DIAGNOSIS — Z79899 Other long term (current) drug therapy: Secondary | ICD-10-CM | POA: Diagnosis not present

## 2021-03-08 DIAGNOSIS — M542 Cervicalgia: Secondary | ICD-10-CM | POA: Diagnosis not present

## 2021-03-08 DIAGNOSIS — M79605 Pain in left leg: Secondary | ICD-10-CM | POA: Diagnosis not present

## 2021-03-08 DIAGNOSIS — Z87820 Personal history of traumatic brain injury: Secondary | ICD-10-CM | POA: Diagnosis not present

## 2021-03-08 DIAGNOSIS — M545 Low back pain, unspecified: Secondary | ICD-10-CM | POA: Diagnosis not present

## 2021-03-10 DIAGNOSIS — Z79899 Other long term (current) drug therapy: Secondary | ICD-10-CM | POA: Diagnosis not present

## 2021-03-16 ENCOUNTER — Telehealth: Payer: Self-pay

## 2021-03-16 ENCOUNTER — Telehealth: Payer: Medicare HMO

## 2021-03-16 NOTE — Telephone Encounter (Signed)
  Care Management   Follow Up Note   03/16/2021 Name: Vincent Lenzen Sr. MRN: [id number] DOB: [birth year]   Referred by: [name], FNP Reason for referral : Chronic Care Management (Unsuccessful call)   SW received a voice message from patients spouse indicating she has yet to complete CAP/DA paperwork and would like to know if she may drop it off for SW to complete. Unsuccessful outbound call placed to [name].  Voice message left advising patient portion would need to be completed by the patient and that medical portion of paperwork would need to be completed by primary provider.  Follow Up Plan:  SW will follow up over the next month as previously scheduled.  Daneen Schick, BSW, CDP Social Worker, Certified Dementia Practitioner [place] / [place] [facility] [phone]

## 2021-03-20 ENCOUNTER — Ambulatory Visit: Payer: Medicare HMO

## 2021-03-20 DIAGNOSIS — G894 Chronic pain syndrome: Secondary | ICD-10-CM

## 2021-03-20 DIAGNOSIS — F331 Major depressive disorder, recurrent, moderate: Secondary | ICD-10-CM

## 2021-03-20 DIAGNOSIS — S069X3S Unspecified intracranial injury 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, sequela: Secondary | ICD-10-CM

## 2021-03-20 DIAGNOSIS — N319 Neuromuscular dysfunction of bladder, unspecified: Secondary | ICD-10-CM

## 2021-03-20 NOTE — Chronic Care Management (AMB) (Signed)
Chronic Care Management    Social Work Note  03/20/2021 Name: Vincent Wherley Sr. MRN: [id number] DOB: 08-31-1966  Vincent Mems Sr. is a 55 y.o. year old male who is a primary care patient of [name], [place]. The CCM team was consulted to assist the patient with chronic disease management and/or care coordination needs related to: [employer] .   Engaged with patients spouse by phone  for follow up visit in response to provider referral for social work chronic care management and care coordination services.   Consent to Services:  The patient was given information about Chronic Care Management services, agreed to services, and gave verbal consent prior to initiation of services.  Please see initial visit note for detailed documentation.   Patient agreed to services and consent obtained.   Assessment: Review of patient past medical history, allergies, medications, and health status, including review of relevant consultants reports was performed today as part of a comprehensive evaluation and provision of chronic care management and care coordination services.     SDOH (Social Determinants of Health) assessments and interventions performed:    Advanced Directives Status: Not addressed in this encounter.  CCM Care Plan  No Known Allergies  Outpatient Encounter Medications as of 03/20/2021  Medication Sig   diclofenac sodium (VOLTAREN) 1 % GEL Apply 2 g 4 (four) times daily topically.   docusate sodium (COLACE) 50 MG capsule Take 50 mg by mouth 2 (two) times daily.   furosemide (LASIX) 20 MG tablet TAKE 1 TABLET BY MOUTH EVERY DAY (Patient taking differently: daily as needed.)   gabapentin (NEURONTIN) 600 MG tablet TAKE 1 TABLET THREE TIMES DAILY (Patient taking differently: 2 (two) times daily.)   GNP MAGNESIUM OXIDE 250 MG TABS TAKE 1 TABLET (250 MG TOTAL) BY MOUTH EVERY EVENING.   latanoprost (XALATAN) 0.005 % ophthalmic solution Place 1 drop into both eyes  nightly.   lidocaine (LIDODERM) 5 % Place 1 patch onto the skin daily. Remove & Discard patch within 12 hours or as directed by MD   meloxicam (MOBIC) 7.5 MG tablet Take 1 tablet (7.5 mg total) by mouth daily. (Patient not taking: Reported on 03/02/2021)   methocarbamol (ROBAXIN) 500 MG tablet TAKE 1 TABLET EVERY 8 HOURS AS NEEDED FOR MUSCLE SPASM(S)   naphazoline-glycerin (CLEAR EYES) 0.012-0.2 % SOLN Place 2 drops 4 (four) times daily -  with meals and at bedtime into both eyes.   oxycodone-acetaminophen (PERCOCET) 2.5-325 MG tablet Take 1 tablet by mouth every 4 (four) hours as needed for pain. 5-3.25 per pt wife   sertraline (ZOLOFT) 50 MG tablet Take 75 mg by mouth daily.   tamsulosin (FLOMAX) 0.4 MG CAPS capsule TAKE 1 CAPSULE EVERY DAY   timolol (TIMOPTIC) 0.5 % ophthalmic solution    No facility-administered encounter medications on file as of 03/20/2021.    Patient Active Problem List   Diagnosis Date Noted   Pseudomonas urinary tract infection    Neurogenic bladder    Neck pain    Chronic pain syndrome    Transaminitis    Diffuse TBI w loss of consciousness of unsp duration, init ([place]) 05/21/2017   Tetraplegia ([place]) 05/21/2017   Fracture    Trauma    Polysubstance abuse (HCC)    Fever    Tachycardia    Post-operative pain    Leukocytosis    SIRS (systemic inflammatory response syndrome) ([place])    Acute blood loss anemia    TBI (traumatic brain injury) ([place]) 05/03/2017  Polysubstance abuse ([place]) 01/12/2017    Conditions to be addressed/monitored:  Traumatic Brain Injury, Chronic Pain Syndrome, Neurogenic Bladder, and Moderate Episode of major depressive disorder ; Limited access to caregiver  Care Plan : Social Work [facility] Care Plan  Updates made by Daneen Schick since 03/20/2021 12:00 AM     Problem: Barriers to Treatment      Long-Range Goal: Barriers to Treatment Identified and Managed   Start Date: 01/18/2021  Expected End Date: 04/19/2021  Recent Progress: On  track  Priority: High  Note:   Current Barriers:  Chronic disease management support and education needs related to  Traumatic Brain Injury, Chronic Pain Syndrome, Neurogenic Bladder, and Moderate Episode of Recurrent Major Depressive Disorder   Transportation Limited access to a caregiver Social Worker Clinical Goal(s):  Over the next 90 days the patient will work with SW to identify and address any acute and/or chronic care coordination needs related to the self health management of  Traumatic Brain Injury, Chronic Pain Syndrome, Neurogenic Bladder, and Moderate Episode of Recurrent Major Depressive Disorder   Over the next 45 days the patient and his spouse will work with SW to identify resources to assist with transportation Over the next 90 days the patient and his spouse will identify caregiver resource options SW Interventions:  Inter-disciplinary care team collaboration (see longitudinal plan of care) Collaboration with [name], FNP regarding development and update of comprehensive plan of care as evidenced by provider attestation and co-signature Successful outbound call placed to Gertha Calkin in response to a voice message received Mrs. Kitchings indicates she dropped off physician paperwork for CAP/DA program Advised Mrs. Krueger SW would inform patients provider forms have been dropped off Collaboration with [name] FNP to advise forms for CAP/DA dropped off   Patient Goals/Self-Care Activities patient will: With the help of his spouse  -  Contact SW as needed prior to next scheduled call - Engage with RN Care Manager to develop an individualized plan of care -Complete CAP application packet Follow Up Plan:  SW will follow up with the patient over the next 21 days       Follow Up Plan: SW will follow up with patient by phone over the next 21 days      Daneen Schick, BSW, CDP Social Worker, Certified Dementia Practitioner [place] [facility] [place]  [facility] [phone]

## 2021-03-20 NOTE — Patient Instructions (Signed)
Social Worker Visit Information  Goals we discussed today:   Goals Addressed             This Visit's Progress    Barriers to treatment identified and managed       Timeframe:  Long-Range Goal Priority:  High Start Date:   5.11.22                          Expected End Date: 8.10.22             Next planned outreach: 7.28.22            Patient Goals/Self-Care Activities patient will: With the help of his spouse  - Contact SW as needed prior to next scheduled call - Engage with RN Care Manager to develop an individualized plan of care -Complete CAP application packet          Materials Provided: Verbal education about CAP/DA program provided by phone  Follow Up Plan: SW will follow up with patient by phone over the next 21 days   Daneen Schick, BSW, CDP Social Worker, Certified Dementia Practitioner [place] [facility] [place] [facility] [phone]

## 2021-04-03 ENCOUNTER — Other Ambulatory Visit: Payer: Self-pay | Admitting: Nurse Practitioner

## 2021-04-03 DIAGNOSIS — G894 Chronic pain syndrome: Secondary | ICD-10-CM

## 2021-04-06 ENCOUNTER — Telehealth: Payer: Medicare HMO

## 2021-04-06 ENCOUNTER — Telehealth: Payer: Self-pay

## 2021-04-06 DIAGNOSIS — M79605 Pain in left leg: Secondary | ICD-10-CM | POA: Diagnosis not present

## 2021-04-06 DIAGNOSIS — Z79899 Other long term (current) drug therapy: Secondary | ICD-10-CM | POA: Diagnosis not present

## 2021-04-06 DIAGNOSIS — Z87820 Personal history of traumatic brain injury: Secondary | ICD-10-CM | POA: Diagnosis not present

## 2021-04-06 DIAGNOSIS — M545 Low back pain, unspecified: Secondary | ICD-10-CM | POA: Diagnosis not present

## 2021-04-06 DIAGNOSIS — M542 Cervicalgia: Secondary | ICD-10-CM | POA: Diagnosis not present

## 2021-04-06 NOTE — Telephone Encounter (Signed)
  Care Management   Follow Up Note   04/06/2021 Name: Vincent Tivnan Sr. MRN: [id number] DOB: Mar 07, 1966   Referred by: [name], FNP Reason for referral : Chronic Care Management   An unsuccessful telephone outreach was attempted today. The patient was referred to the case management team for assistance with care management and care coordination. SW left a HIPAA compliant voice message requesting a return call.  Follow Up Plan: The care management team will reach out to the patient again over the next 30 days.   Daneen Schick, BSW, CDP Social Worker, Certified Dementia Practitioner [place] [facility] [place] [facility] [phone]

## 2021-04-07 ENCOUNTER — Ambulatory Visit (INDEPENDENT_AMBULATORY_CARE_PROVIDER_SITE_OTHER): Payer: Medicare HMO

## 2021-04-07 ENCOUNTER — Other Ambulatory Visit: Payer: Self-pay

## 2021-04-07 DIAGNOSIS — F331 Major depressive disorder, recurrent, moderate: Secondary | ICD-10-CM | POA: Diagnosis not present

## 2021-04-07 DIAGNOSIS — N319 Neuromuscular dysfunction of bladder, unspecified: Secondary | ICD-10-CM

## 2021-04-07 DIAGNOSIS — G894 Chronic pain syndrome: Secondary | ICD-10-CM

## 2021-04-07 DIAGNOSIS — S069X3S Unspecified intracranial injury 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, sequela: Secondary | ICD-10-CM

## 2021-04-07 DIAGNOSIS — R600 Localized edema: Secondary | ICD-10-CM

## 2021-04-07 DIAGNOSIS — Z79899 Other long term (current) drug therapy: Secondary | ICD-10-CM | POA: Diagnosis not present

## 2021-04-07 NOTE — Patient Instructions (Signed)
Social Worker Visit Information  Goals we discussed today:   Goals Addressed             This Visit's Progress    Barriers to treatment identified and managed       Timeframe:  Long-Range Goal Priority:  High Start Date:   5.11.22                          Expected End Date: 8.10.22             Next planned outreach: 8.10.22            Patient Goals/Self-Care Activities patient will: With the help of his spouse  - Contact SW as needed prior to next scheduled call - Engage with RN Care Manager to develop an individualized plan of care -Engage with home health PT to evaluate need for an Radiation protection practitioner         Materials Provided: Yes: verbal and written education about resource needs  Follow Up Plan: SW will follow up with patient by phone over the next month   Daneen Schick, BSW, CDP Social Worker, Certified Dementia Practitioner [place] [facility] [place] [facility] [phone]

## 2021-04-07 NOTE — Chronic Care Management (AMB) (Signed)
Chronic Care Management    Social Work Note  04/07/2021 Name: Vincent Deasy Sr. MRN: [id number] DOB: 09-30-1965  Vincent Mems Sr. is a 55 y.o. year old male who is a primary care patient of [name], [place]. The CCM team was consulted to assist the patient with chronic disease management and/or care coordination needs related to: Transportation Needs  and Level of Care Concerns.   Engaged with patients wife by phone  for follow up visit in response to provider referral for social work chronic care management and care coordination services.   Consent to Services:  The patient was given information about Chronic Care Management services, agreed to services, and gave verbal consent prior to initiation of services.  Please see initial visit note for detailed documentation.   Patient agreed to services and consent obtained.   Assessment: Review of patient past medical history, allergies, medications, and health status, including review of relevant consultants reports was performed today as part of a comprehensive evaluation and provision of chronic care management and care coordination services.     SDOH (Social Determinants of Health) assessments and interventions performed:    Advanced Directives Status: Not addressed in this encounter.  CCM Care Plan  No Known Allergies  Outpatient Encounter Medications as of 04/07/2021  Medication Sig   diclofenac sodium (VOLTAREN) 1 % GEL Apply 2 g 4 (four) times daily topically.   docusate sodium (COLACE) 50 MG capsule Take 50 mg by mouth 2 (two) times daily.   furosemide (LASIX) 20 MG tablet TAKE 1 TABLET BY MOUTH EVERY DAY (Patient taking differently: daily as needed.)   gabapentin (NEURONTIN) 600 MG tablet TAKE 1 TABLET THREE TIMES DAILY (Patient taking differently: 2 (two) times daily.)   GNP MAGNESIUM OXIDE 250 MG TABS TAKE 1 TABLET (250 MG TOTAL) BY MOUTH EVERY EVENING.   latanoprost (XALATAN) 0.005 % ophthalmic solution Place  1 drop into both eyes nightly.   lidocaine (LIDODERM) 5 % Place 1 patch onto the skin daily. Remove & Discard patch within 12 hours or as directed by MD   meloxicam (MOBIC) 7.5 MG tablet Take 1 tablet (7.5 mg total) by mouth daily. (Patient not taking: Reported on 03/02/2021)   methocarbamol (ROBAXIN) 500 MG tablet TAKE 1 TABLET EVERY 8 HOURS AS NEEDED FOR MUSCLE SPASM(S)   naphazoline-glycerin (CLEAR EYES) 0.012-0.2 % SOLN Place 2 drops 4 (four) times daily -  with meals and at bedtime into both eyes.   oxycodone-acetaminophen (PERCOCET) 2.5-325 MG tablet Take 1 tablet by mouth every 4 (four) hours as needed for pain. 5-3.25 per pt wife   sertraline (ZOLOFT) 50 MG tablet Take 75 mg by mouth daily.   tamsulosin (FLOMAX) 0.4 MG CAPS capsule TAKE 1 CAPSULE EVERY DAY   timolol (TIMOPTIC) 0.5 % ophthalmic solution    No facility-administered encounter medications on file as of 04/07/2021.    Patient Active Problem List   Diagnosis Date Noted   Pseudomonas urinary tract infection    Neurogenic bladder    Neck pain    Chronic pain syndrome    Transaminitis    Diffuse TBI w loss of consciousness of unsp duration, init ([place]) 05/21/2017   Tetraplegia ([place]) 05/21/2017   Fracture    Trauma    Polysubstance abuse (HCC)    Fever    Tachycardia    Post-operative pain    Leukocytosis    SIRS (systemic inflammatory response syndrome) (HCC)    Acute blood loss anemia    TBI (traumatic  brain injury) ([place]) 05/03/2017   Polysubstance abuse ([place]) 01/12/2017    Conditions to be addressed/monitored:  Traumatic Brain Injury, Chronic Pain Syndrome, Neurogenic Bladder, and Moderate Episode of Recurrent Major Depressive Disorder ; Transportation and Limited access to caregiver  Care Plan : Social Work [facility] Care Plan  Updates made by Daneen Schick since 04/07/2021 12:00 AM     Problem: Barriers to Treatment      Long-Range Goal: Barriers to Treatment Identified and Managed   Start Date: 01/18/2021   Expected End Date: 04/19/2021  Recent Progress: On track  Priority: High  Note:   Current Barriers:  Chronic disease management support and education needs related to  Traumatic Brain Injury, Chronic Pain Syndrome, Neurogenic Bladder, and Moderate Episode of Recurrent Major Depressive Disorder   Transportation Limited access to a caregiver Social Worker Clinical Goal(s):  Over the next 90 days the patient will work with SW to identify and address any acute and/or chronic care coordination needs related to the self health management of  Traumatic Brain Injury, Chronic Pain Syndrome, Neurogenic Bladder, and Moderate Episode of Recurrent Major Depressive Disorder   Over the next 45 days the patient and his spouse will work with SW to identify resources to assist with transportation Over the next 90 days the patient and his spouse will identify caregiver resource options SW Interventions:  Inter-disciplinary care team collaboration (see longitudinal plan of care) Collaboration with [name], FNP regarding development and update of comprehensive plan of care as evidenced by provider attestation and co-signature Inbound call received from patients spouse who reports the patients CAP/DA application was submitted and he is number [phone] on the wait list Discussed the patient is beginning to have difficulty maneuvering his manual wheel chair and would like an electric chair Advised [name] SW would collaborate with patients primary provider [name], FNP to request orders for a PT evaluation regarding an electric chair in the home Determined [name] has misplaced information on how to arrange [employer] transportation for the patient Provided written education via e-mail indicating how to arrange transportation Collaboration with [name], FNP to request PT orders to address interest in an electric wheel chair  Patient Goals/Self-Care Activities patient will: With the help of his  spouse  -  Contact SW as needed prior to next scheduled call - Engage with RN Care Manager to develop an individualized plan of care -Engage with home health PT to evaluate need for an electric wheel chair  Follow Up Plan:  SW will follow up with the patient over the next 21 days       Follow Up Plan: SW will follow up with patient by phone over the next month      Daneen Schick, BSW, CDP Social Worker, Certified Dementia Practitioner [place] [facility] [place] [facility] [phone]

## 2021-04-10 ENCOUNTER — Telehealth: Payer: Medicare HMO

## 2021-04-10 ENCOUNTER — Telehealth: Payer: Self-pay

## 2021-04-10 NOTE — Telephone Encounter (Signed)
  Care Management   Follow Up Note   04/10/2021 Name: Vincent Zakowski Sr. MRN: [id number] DOB: 1966-06-03   Referred by: [name], FNP Reason for referral : Chronic Care Management (Initial RN CM Outreach - 3rd attempt )   Third unsuccessful telephone outreach was attempted today. The patient was referred to the case management team for assistance with care management and care coordination. The patient's primary care provider has been notified of our unsuccessful attempts to make or maintain contact with the patient. The care management team is pleased to engage with this patient at any time in the future should he/she be interested in assistance from the care management team.   Follow Up Plan: A HIPPA compliant phone message was left for the patient providing contact information and requesting a return call.   Barb Merino, RN, BSN, CCM Care Management Coordinator [place] [facility]  Direct Phone: [phone]

## 2021-04-11 ENCOUNTER — Ambulatory Visit (INDEPENDENT_AMBULATORY_CARE_PROVIDER_SITE_OTHER): Payer: Medicare HMO

## 2021-04-11 ENCOUNTER — Other Ambulatory Visit: Payer: Self-pay | Admitting: Nurse Practitioner

## 2021-04-11 ENCOUNTER — Encounter: Payer: Self-pay | Admitting: Nurse Practitioner

## 2021-04-11 ENCOUNTER — Telehealth: Payer: Medicare HMO

## 2021-04-11 DIAGNOSIS — G894 Chronic pain syndrome: Secondary | ICD-10-CM

## 2021-04-11 DIAGNOSIS — N319 Neuromuscular dysfunction of bladder, unspecified: Secondary | ICD-10-CM

## 2021-04-11 DIAGNOSIS — F331 Major depressive disorder, recurrent, moderate: Secondary | ICD-10-CM

## 2021-04-11 DIAGNOSIS — R269 Unspecified abnormalities of gait and mobility: Secondary | ICD-10-CM

## 2021-04-11 DIAGNOSIS — R300 Dysuria: Secondary | ICD-10-CM

## 2021-04-11 DIAGNOSIS — Z8659 Personal history of other mental and behavioral disorders: Secondary | ICD-10-CM

## 2021-04-11 DIAGNOSIS — S069X3S Unspecified intracranial injury 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, sequela: Secondary | ICD-10-CM

## 2021-04-11 NOTE — Patient Instructions (Signed)
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, Chronic Pain Syndrome, Neurogenic bladder, Dysuria, Chronic neck pain, Moderate episode of recurrent major depressive disorder  with RNCM, SW and Pharmacy Care Management and Care coordination needs Case Manager Clinical Goal(s):  Patient will work with BSW to address needs related to care coordination needs related to nephropathy in patient with Traumatic brain injury, 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, Chronic Pain Syndrome, Neurogenic bladder, Dysuria, Chronic neck pain, Moderate episode of recurrent major depressive disorder  with RNCM, SW and Pharmacy Care Management and Care Coordination needs Interventions:  Collaboration with Glendale Chard, MD regarding development and update of comprehensive plan of care as evidenced by provider attestation and co-signature Inter-disciplinary care team collaboration (see longitudinal plan of care) Collaborated with BSW to initiate plan of care to address needs related to care coordination needs related to Traumatic brain injury, 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    Follow Up Plan: The care management team will reach out to the patient again over the next 30 days.        Patient Care Plan: Social Work [facility] Care Plan     Problem Identified: Barriers to Treatment      Long-Range Goal: Barriers to Treatment Identified and Managed   Start Date: 01/18/2021  Expected End Date: 04/19/2021  Recent Progress: On track  Priority: High  Note:   Current Barriers:  Chronic disease management support and education needs related to  Traumatic Brain Injury, Chronic Pain Syndrome, Neurogenic Bladder, and Moderate Episode of Recurrent Major Depressive Disorder   Transportation Limited access to a caregiver Social Worker Clinical Goal(s):  Over the next 90 days the patient will work with SW to identify and address any acute and/or chronic care coordination needs related to the self health management of  Traumatic Brain Injury, Chronic Pain Syndrome, Neurogenic Bladder, and Moderate Episode of Recurrent Major Depressive Disorder   Over the next 45 days the patient and his spouse will work with SW to identify resources to assist with transportation Over the next 90 days the patient and his spouse will identify caregiver resource options SW Interventions:  Inter-disciplinary care team collaboration (see longitudinal plan of care) Collaboration with [name], FNP regarding development and update of comprehensive plan of care as evidenced by provider attestation and co-signature Inbound call received from patients spouse who reports the patients CAP/DA application was submitted and he is number [phone] on the wait list Discussed the patient is beginning to have difficulty maneuvering his manual wheel chair and would like an electric chair Advised [name] SW would collaborate with  patients primary provider [name], FNP to request orders for a PT  evaluation regarding an electric chair in the home Determined Mrs. Gwyn has misplaced information on how to arrange [place] transportation for the patient Provided written education via e-mail indicating how to arrange transportation Collaboration with [name], FNP to request PT orders to address interest in an electric wheel chair  Patient Goals/Self-Care Activities patient will: With the help of his spouse  -  Contact SW as needed prior to next scheduled call - Engage with RN Care Manager to develop an individualized plan of care -Engage with home health PT to evaluate need for an electric wheel chair  Follow Up Plan:  SW will follow up with the patient over the next 21 days     Patient Care Plan: Chronic Pain (Adult)     Problem Identified: Chronic Pain Management (Chronic Pain)   Priority: High     Long-Range Goal: Chronic Pain Managed   Start Date: 04/11/2021  Expected End Date: 04/11/2022  This Visit's Progress: On track  Priority: High  Note:   Current Barriers:  Knowledge Deficits related to self-health management of acute or chronic pain Chronic Disease Management support and education needs related to chronic pain Chronic Disease Management support and education needs related to Traumatic brain injury, 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, Chronic Pain Syndrome, Neurogenic bladder, Dysuria, Chronic neck pain, Moderate episode of recurrent major depressive disorder  Transportation Limited Caregiver assistance  Clinical Goal(s):  patient will verbalize acceptable level of pain relief and ability to engage in desired activities Interventions:  04/11/21 completed successful outbound call with wife Hermelinda Medicus with [name], [place] regarding development and update of comprehensive plan of care as evidenced by provider attestation and co-signature Pain assessment performed Medications reviewed Discussed plans with patient for ongoing care  management follow up and provided patient with direct contact information for care management team Evaluation of current treatment plan related to Chronic Pain and patient's adherence to plan as established by provider. Reviewed medications with patient and discussed importance of medication adherence Determined patient is currently established with [facility], Dr. Vilinda Flake for pain management of Cervicalgia, pain in left leg, low back pain  Determined patient would like to see another provider within the same practice, wife plans to call this provider to request next visit be scheduled with provider Hubbard Robinson PA-C  Discussed plans with patient for ongoing care management follow up and provided patient with direct contact information for care management team Patient Self Care Activities:  Will self-administer medications as prescribed Will attend all scheduled provider appointments Will call pharmacy for medication refills 7 days prior to needed refill date Patient will calls provider office for new concerns or questions Patient Goals:  - learn relaxation techniques - practice relaxation or meditation daily - use distraction techniques - use relaxation during pain  Follow Up Plan: Telephone follow up appointment with care management team member scheduled for: 05/04/21      Patient Care Plan: Depression (Adult)     Problem Identified: Symptoms (Depression)   Priority: High     Long-Range Goal: Symptoms Monitored and Managed   Start Date: 04/11/2021  Expected End Date: 04/11/2022  This Visit's Progress: On track  Priority: High  Note:   Current Barriers:  Ineffective Self Health Maintenance in a patient with Traumatic brain injury, 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, Chronic Pain Syndrome, Neurogenic  bladder, Dysuria, Chronic neck pain, Moderate episode of recurrent major depressive disorder  Transportation Limited Caregiver assistance   Clinical Goal(s):  Collaboration with [name], FNP regarding development and update of comprehensive plan of care as evidenced by provider attestation and co-signature Inter-disciplinary care team collaboration (see longitudinal plan of care) patient will work with care management team to address care coordination and chronic disease management needs related to Disease Management Educational Needs Care Coordination Medication Management and Education Psychosocial Support   Interventions:  04/11/21 completed successful outbound call with wife [name] Evaluation of current treatment plan related to Depression, self-management and patient's adherence to plan as established by provider. Collaboration with [name], FNP regarding development and update of comprehensive plan of care as evidenced by provider attestation       and co-signature Inter-disciplinary care team collaboration (see longitudinal plan of care) Determined patient suffers from a past traumatic brain injury resulting in Moderate episode of recurrent major depressive disorder  Discussed wife's concerns related to patients recurrent depressive episodes as well as other behavior changes to include verbal outburst and ineffective coping Discussed a Psychiatry referral was sent by PCP on 03/02/21, however this referral looks to be closed to the provider, "[facility]" is out of network for patient Collaborated with PCP [name] FNP requesting follow up on this referral and requested a new referral be sent, patients wife is aware PCP will follow up on this referral Discussed plans with patient for ongoing care management follow up and provided patient with direct contact information for care management team Self Care Activities:  Self administers medications as prescribed Attends all scheduled provider appointments Calls pharmacy for medication refills Calls provider office for new concerns or questions Patient Goals: -  schedule counseling appointment  Follow Up Plan: Telephone follow up appointment with care management team member scheduled for: 05/04/21    Patient Care Plan: Fall Risk (Adult)     Problem Identified: Fall Risk   Priority: High     Long-Range Goal: Absence of Fall and Fall-Related Injury   Start Date: 04/11/2021  Expected End Date: 04/11/2022  This Visit's Progress: On track  Priority: High  Note:   Current Barriers:  Knowledge Deficits related to fall precautions in patient with  Decreased adherence to prescribed treatment for fall prevention Clinical Goal(s):  patient will demonstrate improved adherence to prescribed treatment plan for decreasing falls as evidenced by patient reporting and review of EMR patient will verbalize using fall risk reduction strategies discussed patient will not experience additional falls Patient will be evaluated for a power W/C to help promote independence and lower fall risk  Interventions:  04/11/21 completed successful outbound call with wife Hermelinda Medicus with [name], [place] regarding development and update of comprehensive plan of care as evidenced by provider attestation and co-signature Inter-disciplinary care team collaboration (see longitudinal plan of care) Evaluation of current treatment plan related to Impaired Physical Mobility and patient's adherence to plan as established by provider. Provided written and verbal education re: Potential causes of falls and Fall prevention strategies Reviewed medications and discussed potential side effects of medications such as dizziness and frequent urination Assessed patients/wife knowledge of fall risk prevention strategies Determined patient currently uses a manual W/C for mobility but is having difficulty maneuvering himself due to having decreased upper body strength and dexterity Collaborated with PCP provider [name] FNP regarding orders for a HSE for evaluation of a power W/C to be  sent to [place] wife [name] on  next steps and explained the process for obtaining a power W/C for patient  Discussed plans with patient for ongoing care management follow up and provided patient with direct contact information for care management team Patient Goals:  - Utilize wheelchair (assistive device) appropriately with all ambulation - De-clutter walkways - Change positions slowly - Wear secure fitting shoes at all times with ambulation - Utilize home lighting for dim lit areas - Demonstrate self and pet awareness at all times  Follow Up Plan: Telephone follow up appointment with care management team member scheduled for: 05/04/21      Goals Addressed      Begin and Stick with Counseling-Depression   On track    Timeframe:  Long-Range Goal Priority:  High Start Date:  04/11/21                           Expected End Date:  04/11/22                     Follow Up Date: 05/04/21    - schedule counseling appointment    Why is this important?   Beating depression may take some time.  If you don't feel better right away, don't give up on your treatment plan.    Notes:      Cope with Chronic Pain   On track    Timeframe:  Long-Range Goal Priority:  High Start Date:                             Expected End Date:                       Follow Up Date:  05/04/21    - learn relaxation techniques - practice relaxation or meditation daily - use distraction techniques - use relaxation during pain    Why is this important?   Stress makes chronic pain feel worse.  Feelings like depression, anxiety, stress and anger can make your body more sensitive to pain.  Learning ways to cope with stress or depression may help you find some relief from the pain.     Notes:      Prevent Falls and Injury   On track    Timeframe:  Long-Range Goal Priority:  High Start Date:  04/11/21                           Expected End Date:  04/11/22                      Follow Up Date: 05/04/21    Patient Goals:  - Utilize wheelchair (assistive device) appropriately with all ambulation - De-clutter walkways - Change positions slowly - Wear secure fitting shoes at all times with ambulation - Utilize home lighting for dim lit areas - Demonstrate self and pet awareness at all times   Why is this important?   Most falls happen when it is hard for you to walk safely. Your balance may be off because of an illness. You may have pain in your knees, hip or other joints.  You may be overly tired or taking medicines that make you sleepy. You may not be able to see or hear clearly.  Falls can lead to broken bones, bruises or other injuries.  There are things you can do  to help prevent falling.     Notes:

## 2021-04-11 NOTE — Chronic Care Management (AMB) (Signed)
Chronic Care Management   CCM RN Visit Note  04/11/2021 Name: Vincent Gobbi Sr. MRN: [id number] DOB: 1966/08/17  Subjective: Vincent Mems Sr. is a 55 y.o. year old male who is a primary care patient of [name], [place]. The care management team was consulted for assistance with disease management and care coordination needs.    Engaged with patient by telephone for initial visit in response to provider referral for case management and/or care coordination services.   Consent to Services:  The patient was given information about Chronic Care Management services, agreed to services, and gave verbal consent prior to initiation of services.  Please see initial visit note for detailed documentation.   Patient agreed to services and verbal consent obtained.   Assessment: Review of patient past medical history, allergies, medications, health status, including review of consultants reports, laboratory and other test data, was performed as part of comprehensive evaluation and provision of chronic care management services.   SDOH (Social Determinants of Health) assessments and interventions performed:  Yes, no acute challenges   CCM Care Plan  No Known Allergies  Outpatient Encounter Medications as of 04/11/2021  Medication Sig   diclofenac sodium (VOLTAREN) 1 % GEL Apply 2 g 4 (four) times daily topically.   docusate sodium (COLACE) 50 MG capsule Take 50 mg by mouth 2 (two) times daily.   furosemide (LASIX) 20 MG tablet TAKE 1 TABLET BY MOUTH EVERY DAY (Patient taking differently: daily as needed.)   gabapentin (NEURONTIN) 600 MG tablet TAKE 1 TABLET THREE TIMES DAILY (Patient taking differently: 2 (two) times daily.)   GNP MAGNESIUM OXIDE 250 MG TABS TAKE 1 TABLET (250 MG TOTAL) BY MOUTH EVERY EVENING.   latanoprost (XALATAN) 0.005 % ophthalmic solution Place 1 drop into both eyes nightly.   lidocaine (LIDODERM) 5 % Place 1 patch onto the skin daily. Remove & Discard patch  within 12 hours or as directed by MD   meloxicam (MOBIC) 7.5 MG tablet Take 1 tablet (7.5 mg total) by mouth daily. (Patient not taking: Reported on 03/02/2021)   methocarbamol (ROBAXIN) 500 MG tablet TAKE 1 TABLET EVERY 8 HOURS AS NEEDED FOR MUSCLE SPASM(S)   naphazoline-glycerin (CLEAR EYES) 0.012-0.2 % SOLN Place 2 drops 4 (four) times daily -  with meals and at bedtime into both eyes.   oxycodone-acetaminophen (PERCOCET) 2.5-325 MG tablet Take 1 tablet by mouth every 4 (four) hours as needed for pain. 5-3.25 per pt wife   sertraline (ZOLOFT) 50 MG tablet Take 75 mg by mouth daily.   tamsulosin (FLOMAX) 0.4 MG CAPS capsule TAKE 1 CAPSULE EVERY DAY   timolol (TIMOPTIC) 0.5 % ophthalmic solution    No facility-administered encounter medications on file as of 04/11/2021.    Patient Active Problem List   Diagnosis Date Noted   Pseudomonas urinary tract infection    Neurogenic bladder    Neck pain    Chronic pain syndrome    Transaminitis    Diffuse TBI w loss of consciousness of unsp duration, init ([place]) 05/21/2017   Tetraplegia ([place]) 05/21/2017   Fracture    Trauma    Polysubstance abuse (HCC)    Fever    Tachycardia    Post-operative pain    Leukocytosis    SIRS (systemic inflammatory response syndrome) (HCC)    Acute blood loss anemia    TBI (traumatic brain injury) ([place]) 05/03/2017   Polysubstance abuse ([place]) 01/12/2017    Conditions to be addressed/monitored: Traumatic brain injury, with loss of consciousness  of 1 hour to 5 hours 59 minutes, Chronic Pain Syndrome, Neurogenic bladder, Dysuria, Chronic neck pain, Moderate episode of recurrent major depressive disorder   Care Plan : Assist with Chronic Care Management and Care Coordination needs  Updates made by Lynne Logan, RN since 04/11/2021 12:00 AM  Completed 04/11/2021   Problem: Assist with Chronic Care Management and Care Coordination needs Resolved 04/11/2021  Priority: High     Long-Range Goal: Assist with Chronic  Care Management and Care Coordination needs Completed 04/11/2021  Start Date: 01/18/2021  Expected End Date: 03/09/2021  Recent Progress: On track  Priority: High  Note:   Current Barriers:  Chronic Disease Management support, education, and care coordination needs related to Traumatic brain injury, 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, Chronic Pain Syndrome, Neurogenic bladder, Dysuria, Chronic neck pain, Moderate episode of recurrent major depressive disorder  with RNCM, SW and Pharmacy Care Management and Care coordination needs Case Manager Clinical Goal(s):  Patient will work with BSW to address needs related to care coordination needs related to nephropathy in patient with Traumatic brain injury, 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, Chronic Pain Syndrome, Neurogenic bladder, Dysuria, Chronic neck pain, Moderate episode of recurrent major depressive disorder  with RNCM, SW and Pharmacy Care Management and Care Coordination needs Interventions:  Collaboration with Glendale Chard, MD regarding development and update of comprehensive plan of care as evidenced by provider attestation and co-signature Inter-disciplinary care team collaboration (see longitudinal plan of care) Collaborated with BSW to initiate plan of care to address needs related to care coordination needs related to Traumatic brain injury, 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, Chronic Pain Syndrome, Neurogenic bladder, Dysuria, Chronic neck pain, Moderate episode of recurrent major depressive disorder  Patient Goals/Self-Care Activities Patient will work with the CCM team to address care coordination needs and will continue to work with the clinical team to address health care and disease management related needs.    Follow Up Plan: The care management team will reach out to the patient again over the next 30 days.      Care Plan : Chronic Pain (Adult)  Updates made  by Lynne Logan, RN since 04/11/2021 12:00 AM     Problem: Chronic Pain Management (Chronic Pain)   Priority: High     Long-Range Goal: Chronic Pain Managed   Start Date: 04/11/2021  Expected End Date: 04/11/2022  This Visit's Progress: On track  Priority: High  Note:   Current Barriers:  Knowledge Deficits related to self-health management of acute or chronic pain Chronic Disease Management support and education needs related to chronic pain Chronic Disease Management support and education needs related to Traumatic brain injury, 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, Chronic Pain Syndrome, Neurogenic bladder, Dysuria, Chronic neck pain, Moderate episode of recurrent major depressive disorder  Transportation Limited Caregiver assistance  Clinical Goal(s):  patient will verbalize acceptable level of pain relief and ability to engage in desired activities Interventions:  04/11/21 completed successful outbound call with wife Hermelinda Medicus with [name], [place] regarding development and update of comprehensive plan of care as evidenced by provider attestation and co-signature Pain assessment performed Medications reviewed Discussed plans with patient for ongoing care management follow up and provided patient with direct contact information for care management team Evaluation of current treatment plan related to Chronic Pain and patient's adherence to plan  as established by provider. Reviewed medications with patient and discussed importance of medication adherence Determined patient is currently established with [facility], Dr. Vilinda Flake for pain management of Cervicalgia, pain in left leg, low back pain  Determined patient would like to see another provider within the same practice, wife plans to call this provider to request next visit be scheduled with provider Hubbard Robinson PA-C  Discussed plans with patient for ongoing care management follow  up and provided patient with direct contact information for care management team Patient Self Care Activities:  Will self-administer medications as prescribed Will attend all scheduled provider appointments Will call pharmacy for medication refills 7 days prior to needed refill date Patient will calls provider office for new concerns or questions Patient Goals:  - learn relaxation techniques - practice relaxation or meditation daily - use distraction techniques - use relaxation during pain  Follow Up Plan: Telephone follow up appointment with care management team member scheduled for: 05/04/21     Care Plan : Depression (Adult)  Updates made by Lynne Logan, RN since 04/11/2021 12:00 AM     Problem: Symptoms (Depression)   Priority: High     Long-Range Goal: Symptoms Monitored and Managed   Start Date: 04/11/2021  Expected End Date: 04/11/2022  This Visit's Progress: On track  Priority: High  Note:   Current Barriers:  Ineffective Self Health Maintenance in a patient with Traumatic brain injury, 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, Chronic Pain Syndrome, Neurogenic bladder, Dysuria, Chronic neck pain, Moderate episode of recurrent major depressive disorder  Transportation Limited Caregiver assistance  Clinical Goal(s):  Collaboration with [name], FNP regarding development and update of comprehensive plan of care as evidenced by provider attestation and co-signature Inter-disciplinary care team collaboration (see longitudinal plan of care) patient will work with care management team to address care coordination and chronic disease management needs related to Disease Management Educational Needs Care Coordination Medication Management and Education Psychosocial Support   Interventions:  04/11/21 completed successful outbound call with wife [name] Evaluation of current treatment plan related to Depression, self-management and patient's adherence to plan  as established by provider. Collaboration with [name], FNP regarding development and update of comprehensive plan of care as evidenced by provider attestation       and co-signature Inter-disciplinary care team collaboration (see longitudinal plan of care) Determined patient suffers from a past traumatic brain injury resulting in Moderate episode of recurrent major depressive disorder  Discussed wife's concerns related to patients recurrent depressive episodes as well as other behavior changes to include verbal outburst and ineffective coping Discussed a Psychiatry referral was sent by PCP on 03/02/21, however this referral looks to be closed to the provider, "[facility]" is out of network for patient Collaborated with PCP [name] FNP requesting follow up on this referral and requested a new referral be sent, patients wife is aware PCP will follow up on this referral Discussed plans with patient for ongoing care management follow up and provided patient with direct contact information for care management team Self Care Activities:  Self administers medications as prescribed Attends all scheduled provider appointments Calls pharmacy for medication refills Calls provider office for new concerns or questions Patient Goals: - schedule counseling appointment  Follow Up Plan: Telephone follow up appointment with care management team member scheduled for: 05/04/21    Care Plan : Fall Risk (Adult)  Updates made by Lynne Logan, RN since 04/11/2021 12:00 AM  Problem: Fall Risk   Priority: High     Long-Range Goal: Absence of Fall and Fall-Related Injury   Start Date: 04/11/2021  Expected End Date: 04/11/2022  This Visit's Progress: On track  Priority: High  Note:   Current Barriers:  Knowledge Deficits related to fall precautions in patient with  Decreased adherence to prescribed treatment for fall prevention Clinical Goal(s):  patient will demonstrate improved adherence to  prescribed treatment plan for decreasing falls as evidenced by patient reporting and review of EMR patient will verbalize using fall risk reduction strategies discussed patient will not experience additional falls Patient will be evaluated for a power W/C to help promote independence and lower fall risk  Interventions:  04/11/21 completed successful outbound call with wife Hermelinda Medicus with [name], [place] regarding development and update of comprehensive plan of care as evidenced by provider attestation and co-signature Inter-disciplinary care team collaboration (see longitudinal plan of care) Evaluation of current treatment plan related to Impaired Physical Mobility and patient's adherence to plan as established by provider. Provided written and verbal education re: Potential causes of falls and Fall prevention strategies Reviewed medications and discussed potential side effects of medications such as dizziness and frequent urination Assessed patients/wife knowledge of fall risk prevention strategies Determined patient currently uses a manual W/C for mobility but is having difficulty maneuvering himself due to having decreased upper body strength and dexterity Collaborated with PCP provider [name] FNP regarding orders for a HSE for evaluation of a power W/C to be sent to [place] wife [name] on next steps and explained the process for obtaining a power W/C for patient  Discussed plans with patient for ongoing care management follow up and provided patient with direct contact information for care management team Patient Goals:  - Utilize wheelchair (assistive device) appropriately with all ambulation - De-clutter walkways - Change positions slowly - Wear secure fitting shoes at all times with ambulation - Utilize home lighting for dim lit areas - Demonstrate self and pet awareness at all times  Follow Up Plan: Telephone follow up appointment with care management  team member scheduled for: 05/04/21    Plan:Telephone follow up appointment with care management team member scheduled for:  05/04/21  Barb Merino, RN, BSN, CCM Care Management Coordinator [place] [facility]  Direct Phone: [phone]

## 2021-04-17 ENCOUNTER — Telehealth: Payer: Medicare HMO

## 2021-04-18 ENCOUNTER — Encounter (HOSPITAL_COMMUNITY): Payer: Medicare HMO

## 2021-04-19 ENCOUNTER — Telehealth: Payer: Self-pay

## 2021-04-19 ENCOUNTER — Encounter (HOSPITAL_COMMUNITY): Payer: Medicare HMO

## 2021-04-19 ENCOUNTER — Telehealth: Payer: Medicare HMO

## 2021-04-19 NOTE — Telephone Encounter (Signed)
  Care Management   Follow Up Note   04/19/2021 Name: Vincent Adelson Sr. MRN: [id number] DOB: Oct 16, 1965   Referred by: [name], FNP Reason for referral : Chronic Care Management (Unsuccessful call)   An unsuccessful telephone outreach was attempted today. The patient was referred to the case management team for assistance with care management and care coordination. HIPAA compliant voice message was left requesting a return call.  Follow Up Plan: The care management team will reach out to the patient again over the next 21 days.   Daneen Schick, BSW, CDP Social Worker, Certified Dementia Practitioner [place] [facility] [place] [facility] [phone]

## 2021-04-22 DIAGNOSIS — M5442 Lumbago with sciatica, left side: Secondary | ICD-10-CM | POA: Diagnosis not present

## 2021-04-22 DIAGNOSIS — M546 Pain in thoracic spine: Secondary | ICD-10-CM | POA: Diagnosis not present

## 2021-04-22 DIAGNOSIS — M79605 Pain in left leg: Secondary | ICD-10-CM | POA: Diagnosis not present

## 2021-04-22 DIAGNOSIS — F331 Major depressive disorder, recurrent, moderate: Secondary | ICD-10-CM | POA: Diagnosis not present

## 2021-04-22 DIAGNOSIS — G894 Chronic pain syndrome: Secondary | ICD-10-CM | POA: Diagnosis not present

## 2021-04-22 DIAGNOSIS — F419 Anxiety disorder, unspecified: Secondary | ICD-10-CM | POA: Diagnosis not present

## 2021-04-22 DIAGNOSIS — M542 Cervicalgia: Secondary | ICD-10-CM | POA: Diagnosis not present

## 2021-04-22 DIAGNOSIS — H409 Unspecified glaucoma: Secondary | ICD-10-CM | POA: Diagnosis not present

## 2021-04-22 DIAGNOSIS — F259 Schizoaffective disorder, unspecified: Secondary | ICD-10-CM | POA: Diagnosis not present

## 2021-04-24 ENCOUNTER — Encounter: Payer: Medicare HMO | Admitting: Surgery

## 2021-04-24 DIAGNOSIS — M79605 Pain in left leg: Secondary | ICD-10-CM | POA: Diagnosis not present

## 2021-04-24 DIAGNOSIS — M542 Cervicalgia: Secondary | ICD-10-CM | POA: Diagnosis not present

## 2021-04-24 DIAGNOSIS — F419 Anxiety disorder, unspecified: Secondary | ICD-10-CM | POA: Diagnosis not present

## 2021-04-24 DIAGNOSIS — G894 Chronic pain syndrome: Secondary | ICD-10-CM | POA: Diagnosis not present

## 2021-04-24 DIAGNOSIS — M5442 Lumbago with sciatica, left side: Secondary | ICD-10-CM | POA: Diagnosis not present

## 2021-04-24 DIAGNOSIS — F331 Major depressive disorder, recurrent, moderate: Secondary | ICD-10-CM | POA: Diagnosis not present

## 2021-04-24 DIAGNOSIS — M546 Pain in thoracic spine: Secondary | ICD-10-CM | POA: Diagnosis not present

## 2021-04-24 DIAGNOSIS — H409 Unspecified glaucoma: Secondary | ICD-10-CM | POA: Diagnosis not present

## 2021-04-24 DIAGNOSIS — F259 Schizoaffective disorder, unspecified: Secondary | ICD-10-CM | POA: Diagnosis not present

## 2021-04-26 DIAGNOSIS — G894 Chronic pain syndrome: Secondary | ICD-10-CM | POA: Diagnosis not present

## 2021-04-26 DIAGNOSIS — F419 Anxiety disorder, unspecified: Secondary | ICD-10-CM | POA: Diagnosis not present

## 2021-04-26 DIAGNOSIS — M542 Cervicalgia: Secondary | ICD-10-CM | POA: Diagnosis not present

## 2021-04-26 DIAGNOSIS — F259 Schizoaffective disorder, unspecified: Secondary | ICD-10-CM | POA: Diagnosis not present

## 2021-04-26 DIAGNOSIS — M5442 Lumbago with sciatica, left side: Secondary | ICD-10-CM | POA: Diagnosis not present

## 2021-04-26 DIAGNOSIS — F331 Major depressive disorder, recurrent, moderate: Secondary | ICD-10-CM | POA: Diagnosis not present

## 2021-04-26 DIAGNOSIS — H409 Unspecified glaucoma: Secondary | ICD-10-CM | POA: Diagnosis not present

## 2021-04-26 DIAGNOSIS — M546 Pain in thoracic spine: Secondary | ICD-10-CM | POA: Diagnosis not present

## 2021-04-26 DIAGNOSIS — M79605 Pain in left leg: Secondary | ICD-10-CM | POA: Diagnosis not present

## 2021-05-01 DIAGNOSIS — G894 Chronic pain syndrome: Secondary | ICD-10-CM | POA: Diagnosis not present

## 2021-05-01 DIAGNOSIS — F259 Schizoaffective disorder, unspecified: Secondary | ICD-10-CM | POA: Diagnosis not present

## 2021-05-01 DIAGNOSIS — F419 Anxiety disorder, unspecified: Secondary | ICD-10-CM | POA: Diagnosis not present

## 2021-05-01 DIAGNOSIS — M79605 Pain in left leg: Secondary | ICD-10-CM | POA: Diagnosis not present

## 2021-05-01 DIAGNOSIS — H409 Unspecified glaucoma: Secondary | ICD-10-CM | POA: Diagnosis not present

## 2021-05-01 DIAGNOSIS — M5442 Lumbago with sciatica, left side: Secondary | ICD-10-CM | POA: Diagnosis not present

## 2021-05-01 DIAGNOSIS — M546 Pain in thoracic spine: Secondary | ICD-10-CM | POA: Diagnosis not present

## 2021-05-01 DIAGNOSIS — F331 Major depressive disorder, recurrent, moderate: Secondary | ICD-10-CM | POA: Diagnosis not present

## 2021-05-01 DIAGNOSIS — M542 Cervicalgia: Secondary | ICD-10-CM | POA: Diagnosis not present

## 2021-05-03 ENCOUNTER — Ambulatory Visit: Payer: Medicare HMO

## 2021-05-03 DIAGNOSIS — N319 Neuromuscular dysfunction of bladder, unspecified: Secondary | ICD-10-CM

## 2021-05-03 DIAGNOSIS — F331 Major depressive disorder, recurrent, moderate: Secondary | ICD-10-CM

## 2021-05-03 DIAGNOSIS — G894 Chronic pain syndrome: Secondary | ICD-10-CM

## 2021-05-03 DIAGNOSIS — S069X3S Unspecified intracranial injury 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, sequela: Secondary | ICD-10-CM

## 2021-05-03 NOTE — Patient Instructions (Signed)
Social Worker Visit Information  Goals we discussed today:   Goals Addressed             This Visit's Progress    Barriers to treatment identified and managed       Timeframe:  Long-Range Goal Priority:  High Start Date:   5.11.22                                   Next planned outreach: 9.22.22            Patient Goals/Self-Care Activities patient will: With the help of his spouse  - Contact SW as needed prior to next scheduled call -Engage with home health PT to evaluate need for an electric wheel chair -Follow up with home health regarding number of authorized visits         Follow Up Plan: SW will follow up with patient by phone over the next month   Daneen Schick, BSW, CDP Social Worker, Certified Dementia Practitioner [place] [facility] [place] [facility] [phone]

## 2021-05-03 NOTE — Chronic Care Management (AMB) (Signed)
Chronic Care Management    Social Work Note  05/03/2021 Name: Vincent Yoho Sr. MRN: [id number] DOB: 07/26/1966  Vincent Mems Sr. is a 55 y.o. year old male who is a primary care patient of [name], [place]. The CCM team was consulted to assist the patient with chronic disease management and/or care coordination needs related to:  Traumatic Brain Injury, Chronic Pain Syndrome, Neurogenic Bladder, and Moderate Episode of Recurrent Major Depressive Disorder .   Engaged with patients spouse by phone  for follow up visit in response to provider referral for social work chronic care management and care coordination services.   Consent to Services:  The patient was given information about Chronic Care Management services, agreed to services, and gave verbal consent prior to initiation of services.  Please see initial visit note for detailed documentation.   Patient agreed to services and consent obtained.   Assessment: Review of patient past medical history, allergies, medications, and health status, including review of relevant consultants reports was performed today as part of a comprehensive evaluation and provision of chronic care management and care coordination services.     SDOH (Social Determinants of Health) assessments and interventions performed:    Advanced Directives Status: Not addressed in this encounter.  CCM Care Plan  No Known Allergies  Outpatient Encounter Medications as of 05/03/2021  Medication Sig   diclofenac sodium (VOLTAREN) 1 % GEL Apply 2 g 4 (four) times daily topically.   docusate sodium (COLACE) 50 MG capsule Take 50 mg by mouth 2 (two) times daily.   furosemide (LASIX) 20 MG tablet TAKE 1 TABLET BY MOUTH EVERY DAY (Patient taking differently: daily as needed.)   gabapentin (NEURONTIN) 600 MG tablet TAKE 1 TABLET THREE TIMES DAILY (Patient taking differently: 2 (two) times daily.)   GNP MAGNESIUM OXIDE 250 MG TABS TAKE 1 TABLET (250 MG TOTAL)  BY MOUTH EVERY EVENING.   latanoprost (XALATAN) 0.005 % ophthalmic solution Place 1 drop into both eyes nightly.   lidocaine (LIDODERM) 5 % Place 1 patch onto the skin daily. Remove & Discard patch within 12 hours or as directed by MD   meloxicam (MOBIC) 7.5 MG tablet Take 1 tablet (7.5 mg total) by mouth daily. (Patient not taking: Reported on 03/02/2021)   methocarbamol (ROBAXIN) 500 MG tablet TAKE 1 TABLET EVERY 8 HOURS AS NEEDED FOR MUSCLE SPASM(S)   naphazoline-glycerin (CLEAR EYES) 0.012-0.2 % SOLN Place 2 drops 4 (four) times daily -  with meals and at bedtime into both eyes.   oxycodone-acetaminophen (PERCOCET) 2.5-325 MG tablet Take 1 tablet by mouth every 4 (four) hours as needed for pain. 5-3.25 per pt wife   sertraline (ZOLOFT) 50 MG tablet Take 75 mg by mouth daily.   tamsulosin (FLOMAX) 0.4 MG CAPS capsule TAKE 1 CAPSULE EVERY DAY   timolol (TIMOPTIC) 0.5 % ophthalmic solution    No facility-administered encounter medications on file as of 05/03/2021.    Patient Active Problem List   Diagnosis Date Noted   Pseudomonas urinary tract infection    Neurogenic bladder    Neck pain    Chronic pain syndrome    Transaminitis    Diffuse TBI w loss of consciousness of unsp duration, init ([place]) 05/21/2017   Tetraplegia ([place]) 05/21/2017   Fracture    Trauma    Polysubstance abuse (HCC)    Fever    Tachycardia    Post-operative pain    Leukocytosis    SIRS (systemic inflammatory response syndrome) ([place])  Acute blood loss anemia    TBI (traumatic brain injury) ([place]) 05/03/2017   Polysubstance abuse ([place]) 01/12/2017    Conditions to be addressed/monitored:  Traumatic Brain Injury, Chronic Pain Syndrome, Neurogenic Bladder, Moderate Episode of Recurrent Major Depressive Disorder ; Limited access to caregiver  Care Plan : Social Work [facility] Care Plan  Updates made by Daneen Schick since 05/03/2021 12:00 AM     Problem: Barriers to Treatment      Long-Range Goal: Barriers to  Treatment Identified and Managed   Start Date: 01/18/2021  Recent Progress: On track  Priority: High  Note:   Current Barriers:  Chronic disease management support and education needs related to  Traumatic Brain Injury, Chronic Pain Syndrome, Neurogenic Bladder, and Moderate Episode of Recurrent Major Depressive Disorder   Transportation Limited access to a caregiver Social Worker Clinical Goal(s):  patient will work with SW to identify and address any acute and/or chronic care coordination needs related to the self health management of  Traumatic Brain Injury, Chronic Pain Syndrome, Neurogenic Bladder, and Moderate Episode of Recurrent Major Depressive Disorder   patient and his spouse will work with SW to identify resources to assist with transportation patient and his spouse will identify caregiver resource options SW Interventions:  Inter-disciplinary care team collaboration (see longitudinal plan of care) Collaboration with [name], FNP regarding development and update of comprehensive plan of care as evidenced by provider attestation and co-signature Successful outbound call placed to the patients spouse to assist with care coordination needs Confirmed patient does have home health in place- current disciplines include PT, OT, and aid Assessed for initiation of paperwork for an electric wheelchair - Mrs. Marmol indicates the patient did tell his therapist he would like to obtain an electric wheel chair. PT is also working to get the patient a shower bench Discussed Mrs. Beltrame will have a hernia repair on September 19th - she is inquiring if home health will remain in pace through September as she will need help caring for the patient Educated Mrs. Etchells that home health is short term and based on health plan benefits and how many visits are authorized Advised Mrs. Hebdon to contact her surgeon to determine if she will qualify for home health services and what her limitations  may be post surgery Encouraged Mrs. Guidone to speak with the patients therapist regarding upcomming surgery and desire to continue home health through at least the end of September Discussed the therapist can communicate current number of visits authorized and possibly request an extension based on the patients progress with therapy Scheduled follow up call over the next month  Patient Goals/Self-Care Activities patient will: With the help of his spouse  -  Contact SW as needed prior to next scheduled call -Engage with home health PT to evaluate need for an electric wheel chair -Follow up with home health regarding number of authorized visits  Follow Up Plan:  SW will follow up with the patient over the next month       Follow Up Plan: SW will follow up with patient by phone over the next month      Daneen Schick, BSW, CDP Social Worker, Certified Dementia Practitioner [place] [facility] [place] [facility] [phone]

## 2021-05-04 ENCOUNTER — Ambulatory Visit: Payer: Self-pay

## 2021-05-04 ENCOUNTER — Telehealth: Payer: Medicare HMO

## 2021-05-04 DIAGNOSIS — M79605 Pain in left leg: Secondary | ICD-10-CM | POA: Diagnosis not present

## 2021-05-04 DIAGNOSIS — F259 Schizoaffective disorder, unspecified: Secondary | ICD-10-CM | POA: Diagnosis not present

## 2021-05-04 DIAGNOSIS — G8929 Other chronic pain: Secondary | ICD-10-CM

## 2021-05-04 DIAGNOSIS — M546 Pain in thoracic spine: Secondary | ICD-10-CM | POA: Diagnosis not present

## 2021-05-04 DIAGNOSIS — G894 Chronic pain syndrome: Secondary | ICD-10-CM

## 2021-05-04 DIAGNOSIS — N319 Neuromuscular dysfunction of bladder, unspecified: Secondary | ICD-10-CM

## 2021-05-04 DIAGNOSIS — M5442 Lumbago with sciatica, left side: Secondary | ICD-10-CM | POA: Diagnosis not present

## 2021-05-04 DIAGNOSIS — H409 Unspecified glaucoma: Secondary | ICD-10-CM | POA: Diagnosis not present

## 2021-05-04 DIAGNOSIS — M542 Cervicalgia: Secondary | ICD-10-CM | POA: Diagnosis not present

## 2021-05-04 DIAGNOSIS — F331 Major depressive disorder, recurrent, moderate: Secondary | ICD-10-CM

## 2021-05-04 DIAGNOSIS — R300 Dysuria: Secondary | ICD-10-CM

## 2021-05-04 DIAGNOSIS — S069X3S Unspecified intracranial injury 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, sequela: Secondary | ICD-10-CM

## 2021-05-04 DIAGNOSIS — F419 Anxiety disorder, unspecified: Secondary | ICD-10-CM | POA: Diagnosis not present

## 2021-05-04 NOTE — Chronic Care Management (AMB) (Signed)
  Care Management   Follow Up Note   05/04/2021 Name: Martavious Miera Sr. MRN: [id number] DOB: [birth year]   Referred by: [name], FNP Reason for referral : Chronic Care Management (Case Closure )   Third unsuccessful telephone outreach was attempted today. The patient was referred to the case management team for assistance with care management and care coordination. The patient's primary care provider has been notified of our unsuccessful attempts to make or maintain contact with the patient. The care management team is pleased to engage with this patient at any time in the future should he/she be interested in assistance from the care management team.   Follow Up Plan: We have been unable to make contact with the patient for follow up. The care management team is available to follow up with the patient after provider conversation with the patient regarding recommendation for care management engagement and subsequent re-referral to the care management team.   Barb Merino, RN, BSN, CCM Care Management Coordinator [place] [facility]  Direct Phone: [phone]

## 2021-05-08 DIAGNOSIS — Z1159 Encounter for screening for other viral diseases: Secondary | ICD-10-CM | POA: Diagnosis not present

## 2021-05-08 DIAGNOSIS — M542 Cervicalgia: Secondary | ICD-10-CM | POA: Diagnosis not present

## 2021-05-08 DIAGNOSIS — F32A Depression, unspecified: Secondary | ICD-10-CM | POA: Diagnosis not present

## 2021-05-08 DIAGNOSIS — Z79899 Other long term (current) drug therapy: Secondary | ICD-10-CM | POA: Diagnosis not present

## 2021-05-09 DIAGNOSIS — F419 Anxiety disorder, unspecified: Secondary | ICD-10-CM | POA: Diagnosis not present

## 2021-05-09 DIAGNOSIS — F331 Major depressive disorder, recurrent, moderate: Secondary | ICD-10-CM | POA: Diagnosis not present

## 2021-05-09 DIAGNOSIS — G894 Chronic pain syndrome: Secondary | ICD-10-CM | POA: Diagnosis not present

## 2021-05-09 DIAGNOSIS — M5442 Lumbago with sciatica, left side: Secondary | ICD-10-CM | POA: Diagnosis not present

## 2021-05-09 DIAGNOSIS — M542 Cervicalgia: Secondary | ICD-10-CM | POA: Diagnosis not present

## 2021-05-09 DIAGNOSIS — F259 Schizoaffective disorder, unspecified: Secondary | ICD-10-CM | POA: Diagnosis not present

## 2021-05-09 DIAGNOSIS — H409 Unspecified glaucoma: Secondary | ICD-10-CM | POA: Diagnosis not present

## 2021-05-09 DIAGNOSIS — M79605 Pain in left leg: Secondary | ICD-10-CM | POA: Diagnosis not present

## 2021-05-09 DIAGNOSIS — R339 Retention of urine, unspecified: Secondary | ICD-10-CM | POA: Diagnosis not present

## 2021-05-09 DIAGNOSIS — M546 Pain in thoracic spine: Secondary | ICD-10-CM | POA: Diagnosis not present

## 2021-05-10 DIAGNOSIS — N319 Neuromuscular dysfunction of bladder, unspecified: Secondary | ICD-10-CM | POA: Diagnosis not present

## 2021-05-10 DIAGNOSIS — G8929 Other chronic pain: Secondary | ICD-10-CM

## 2021-05-10 DIAGNOSIS — F331 Major depressive disorder, recurrent, moderate: Secondary | ICD-10-CM | POA: Diagnosis not present

## 2021-05-11 ENCOUNTER — Other Ambulatory Visit: Payer: Self-pay | Admitting: Nurse Practitioner

## 2021-05-11 DIAGNOSIS — R269 Unspecified abnormalities of gait and mobility: Secondary | ICD-10-CM

## 2021-05-11 DIAGNOSIS — G8250 Quadriplegia, unspecified: Secondary | ICD-10-CM

## 2021-05-12 ENCOUNTER — Telehealth: Payer: Medicare HMO

## 2021-05-12 ENCOUNTER — Ambulatory Visit (INDEPENDENT_AMBULATORY_CARE_PROVIDER_SITE_OTHER): Payer: Medicare HMO

## 2021-05-12 DIAGNOSIS — F331 Major depressive disorder, recurrent, moderate: Secondary | ICD-10-CM | POA: Diagnosis not present

## 2021-05-12 DIAGNOSIS — G894 Chronic pain syndrome: Secondary | ICD-10-CM | POA: Diagnosis not present

## 2021-05-12 DIAGNOSIS — S069X3S Unspecified intracranial injury 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, sequela: Secondary | ICD-10-CM

## 2021-05-12 DIAGNOSIS — G8929 Other chronic pain: Secondary | ICD-10-CM

## 2021-05-12 DIAGNOSIS — M542 Cervicalgia: Secondary | ICD-10-CM

## 2021-05-12 DIAGNOSIS — M546 Pain in thoracic spine: Secondary | ICD-10-CM | POA: Diagnosis not present

## 2021-05-12 DIAGNOSIS — M5442 Lumbago with sciatica, left side: Secondary | ICD-10-CM | POA: Diagnosis not present

## 2021-05-12 DIAGNOSIS — F259 Schizoaffective disorder, unspecified: Secondary | ICD-10-CM | POA: Diagnosis not present

## 2021-05-12 DIAGNOSIS — M79605 Pain in left leg: Secondary | ICD-10-CM | POA: Diagnosis not present

## 2021-05-12 DIAGNOSIS — R300 Dysuria: Secondary | ICD-10-CM

## 2021-05-12 DIAGNOSIS — F419 Anxiety disorder, unspecified: Secondary | ICD-10-CM | POA: Diagnosis not present

## 2021-05-12 DIAGNOSIS — H409 Unspecified glaucoma: Secondary | ICD-10-CM | POA: Diagnosis not present

## 2021-05-12 DIAGNOSIS — N319 Neuromuscular dysfunction of bladder, unspecified: Secondary | ICD-10-CM

## 2021-05-18 DIAGNOSIS — M546 Pain in thoracic spine: Secondary | ICD-10-CM | POA: Diagnosis not present

## 2021-05-18 DIAGNOSIS — G894 Chronic pain syndrome: Secondary | ICD-10-CM | POA: Diagnosis not present

## 2021-05-18 DIAGNOSIS — F331 Major depressive disorder, recurrent, moderate: Secondary | ICD-10-CM | POA: Diagnosis not present

## 2021-05-18 DIAGNOSIS — F259 Schizoaffective disorder, unspecified: Secondary | ICD-10-CM | POA: Diagnosis not present

## 2021-05-18 DIAGNOSIS — F419 Anxiety disorder, unspecified: Secondary | ICD-10-CM | POA: Diagnosis not present

## 2021-05-18 DIAGNOSIS — M5442 Lumbago with sciatica, left side: Secondary | ICD-10-CM | POA: Diagnosis not present

## 2021-05-18 DIAGNOSIS — M542 Cervicalgia: Secondary | ICD-10-CM | POA: Diagnosis not present

## 2021-05-18 DIAGNOSIS — M79605 Pain in left leg: Secondary | ICD-10-CM | POA: Diagnosis not present

## 2021-05-18 DIAGNOSIS — H409 Unspecified glaucoma: Secondary | ICD-10-CM | POA: Diagnosis not present

## 2021-05-19 DIAGNOSIS — G894 Chronic pain syndrome: Secondary | ICD-10-CM | POA: Diagnosis not present

## 2021-05-19 DIAGNOSIS — M546 Pain in thoracic spine: Secondary | ICD-10-CM | POA: Diagnosis not present

## 2021-05-19 DIAGNOSIS — M79605 Pain in left leg: Secondary | ICD-10-CM | POA: Diagnosis not present

## 2021-05-19 DIAGNOSIS — F259 Schizoaffective disorder, unspecified: Secondary | ICD-10-CM | POA: Diagnosis not present

## 2021-05-19 DIAGNOSIS — F331 Major depressive disorder, recurrent, moderate: Secondary | ICD-10-CM | POA: Diagnosis not present

## 2021-05-19 DIAGNOSIS — M5442 Lumbago with sciatica, left side: Secondary | ICD-10-CM | POA: Diagnosis not present

## 2021-05-19 DIAGNOSIS — M542 Cervicalgia: Secondary | ICD-10-CM | POA: Diagnosis not present

## 2021-05-19 DIAGNOSIS — F419 Anxiety disorder, unspecified: Secondary | ICD-10-CM | POA: Diagnosis not present

## 2021-05-19 DIAGNOSIS — H409 Unspecified glaucoma: Secondary | ICD-10-CM | POA: Diagnosis not present

## 2021-05-19 NOTE — Patient Instructions (Signed)
Goals Addressed      Maintain Functional Ability   On track    Timeframe:  Long-Range Goal Priority:  High Start Date:  05/12/21                           Expected End Date:  11/09/21  Next Scheduled follow up date: 06/12/21  Patient Goals: - work with in home PT  - complete PT evaluation for power W/C

## 2021-05-19 NOTE — Chronic Care Management (AMB) (Addendum)
Chronic Care Management   CCM RN Visit Note  05/12/2021 Name: Vincent Klippel Sr. MRN: [id number] DOB: April 30, 1966  Subjective: Vincent Mems Sr. is a 55 y.o. year old male who is a primary care patient of [name], [name]. The care management team was consulted for assistance with disease management and care coordination needs.    Engaged with patient by telephone for follow up visit in response to provider referral for case management and/or care coordination services.   Consent to Services:  The patient was given the following information about Chronic Care Management services today, agreed to services, and gave verbal consent: 1. CCM service includes personalized support from designated clinical staff supervised by the primary care provider, including individualized plan of care and coordination with other care providers 2. 24/7 contact phone numbers for assistance for urgent and routine care needs. 3. Service will only be billed when office clinical staff spend 20 minutes or more in a month to coordinate care. 4. Only one practitioner may furnish and bill the service in a calendar month. 5.The patient may stop CCM services at any time (effective at the end of the month) by phone call to the office staff. 6. The patient will be responsible for cost sharing (co-pay) of up to 20% of the service fee (after annual deductible is met). Patient agreed to services and consent obtained.  Patient agreed to services and verbal consent obtained.   Assessment: Review of patient past medical history, allergies, medications, health status, including review of consultants reports, laboratory and other test data, was performed as part of comprehensive evaluation and provision of chronic care management services.   SDOH (Social Determinants of Health) assessments and interventions performed:  Yes, no acute challenges   CCM Care Plan  No Known Allergies  Outpatient Encounter Medications as of  05/12/2021  Medication Sig   diclofenac sodium (VOLTAREN) 1 % GEL Apply 2 g 4 (four) times daily topically.   docusate sodium (COLACE) 50 MG capsule Take 50 mg by mouth 2 (two) times daily.   furosemide (LASIX) 20 MG tablet TAKE 1 TABLET BY MOUTH EVERY DAY (Patient taking differently: daily as needed.)   gabapentin (NEURONTIN) 600 MG tablet TAKE 1 TABLET THREE TIMES DAILY (Patient taking differently: 2 (two) times daily.)   GNP MAGNESIUM OXIDE 250 MG TABS TAKE 1 TABLET (250 MG TOTAL) BY MOUTH EVERY EVENING.   latanoprost (XALATAN) 0.005 % ophthalmic solution Place 1 drop into both eyes nightly.   lidocaine (LIDODERM) 5 % Place 1 patch onto the skin daily. Remove & Discard patch within 12 hours or as directed by MD   meloxicam (MOBIC) 7.5 MG tablet Take 1 tablet (7.5 mg total) by mouth daily. (Patient not taking: Reported on 03/02/2021)   methocarbamol (ROBAXIN) 500 MG tablet TAKE 1 TABLET EVERY 8 HOURS AS NEEDED FOR MUSCLE SPASM(S)   naphazoline-glycerin (CLEAR EYES) 0.012-0.2 % SOLN Place 2 drops 4 (four) times daily -  with meals and at bedtime into both eyes.   oxycodone-acetaminophen (PERCOCET) 2.5-325 MG tablet Take 1 tablet by mouth every 4 (four) hours as needed for pain. 5-3.25 per pt wife   sertraline (ZOLOFT) 50 MG tablet Take 75 mg by mouth daily.   tamsulosin (FLOMAX) 0.4 MG CAPS capsule TAKE 1 CAPSULE EVERY DAY   timolol (TIMOPTIC) 0.5 % ophthalmic solution    No facility-administered encounter medications on file as of 05/12/2021.    Patient Active Problem List   Diagnosis Date Noted   Pseudomonas urinary  tract infection    Neurogenic bladder    Neck pain    Chronic pain syndrome    Transaminitis    Diffuse TBI w loss of consciousness of unsp duration, init (HCC) 05/21/2017   Tetraplegia ([place]) 05/21/2017   Fracture    Trauma    Polysubstance abuse (HCC)    Fever    Tachycardia    Post-operative pain    Leukocytosis    SIRS (systemic inflammatory response syndrome) (HCC)     Acute blood loss anemia    TBI (traumatic brain injury) ([place]) 05/03/2017   Polysubstance abuse ([place]) 01/12/2017    Conditions to be addressed/monitored: Traumatic brain injury, 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, Chronic Pain Syndrome, Neurogenic bladder, Dysuria, Chronic neck pain, Moderate episode of recurrent major depressive disorder   Care Plan : Impaired Physical Mobility  Updates made by Lynne Logan, RN since 05/12/2021 12:00 AM     Problem: Impaired Physical Mobility   Priority: High     Long-Range Goal: Maintain functional ability   Start Date: 05/12/2021  Expected End Date: 11/09/2021  This Visit's Progress: On track  Priority: High  Note:   Current Barriers:  Ineffective Self Health Maintenance in a patient with    Unable to self administer medications as prescribed Unable to perform ADLs independently Unable to perform IADLs independently Impaired Physical Mobility  Clinical Goal(s):  Collaboration with Vincent Brine, FNP regarding development and update of comprehensive plan of care as evidenced by provider attestation and co-signature Inter-disciplinary care team collaboration (see longitudinal plan of care) patient will work with care management team to address care coordination and chronic disease management needs related to Disease Management Educational Needs Care Coordination Medication Management and Education Medication Reconciliation Psychosocial Support Caregiver Stress support   Interventions:  05/12/21 completed inbound call with wife [name] Evaluation of current treatment plan related to  Impaired Physical Mobility  , self-management and patient's adherence to plan as established by provider. Collaboration with Vincent Brine, FNP regarding development and update of comprehensive plan of care as evidenced by provider attestation       and co-signature Inter-disciplinary care team collaboration (see longitudinal plan of  care) Determined patient needs and Rx for a power W/C to be sent to [place] Discussed patient currently uses a manual W/C, however he is having difficulty with dexterity and strengthening making it difficult to manually use this DME Determined patient is currently receiving in home PT for strengthening by [facility] Wife is requesting an Rx for a power W/C and PT evaluation per [facility]  Collaborated with PCP Vincent Brine FNP requesting orders, reply received advising this has been completed, wife [name] made aware Discussed plans with patient for ongoing care management follow up and provided patient with direct contact information for care management team Self Care Activities:  Attends all scheduled provider appointments Calls pharmacy for medication refills Calls provider office for new concerns or questions Patient Goals: - work with in home PT  - complete PT evaluation for power W/C  Follow Up Plan: Telephone follow up appointment with care management team member scheduled for: 06/12/21      Plan:Telephone follow up appointment with care management team member scheduled for:  06/12/21  Barb Merino, RN, BSN, CCM Care Management Coordinator [place] [facility]  Direct Phone: [phone]

## 2021-05-22 DIAGNOSIS — G894 Chronic pain syndrome: Secondary | ICD-10-CM | POA: Diagnosis not present

## 2021-05-22 DIAGNOSIS — H409 Unspecified glaucoma: Secondary | ICD-10-CM | POA: Diagnosis not present

## 2021-05-22 DIAGNOSIS — M546 Pain in thoracic spine: Secondary | ICD-10-CM | POA: Diagnosis not present

## 2021-05-22 DIAGNOSIS — M542 Cervicalgia: Secondary | ICD-10-CM | POA: Diagnosis not present

## 2021-05-22 DIAGNOSIS — F259 Schizoaffective disorder, unspecified: Secondary | ICD-10-CM | POA: Diagnosis not present

## 2021-05-22 DIAGNOSIS — F419 Anxiety disorder, unspecified: Secondary | ICD-10-CM | POA: Diagnosis not present

## 2021-05-22 DIAGNOSIS — M5442 Lumbago with sciatica, left side: Secondary | ICD-10-CM | POA: Diagnosis not present

## 2021-05-22 DIAGNOSIS — F331 Major depressive disorder, recurrent, moderate: Secondary | ICD-10-CM | POA: Diagnosis not present

## 2021-05-22 DIAGNOSIS — M79605 Pain in left leg: Secondary | ICD-10-CM | POA: Diagnosis not present

## 2021-05-23 DIAGNOSIS — M4722 Other spondylosis with radiculopathy, cervical region: Secondary | ICD-10-CM | POA: Diagnosis not present

## 2021-05-23 DIAGNOSIS — M50122 Cervical disc disorder at C5-C6 level with radiculopathy: Secondary | ICD-10-CM | POA: Diagnosis not present

## 2021-05-23 DIAGNOSIS — M4322 Fusion of spine, cervical region: Secondary | ICD-10-CM | POA: Diagnosis not present

## 2021-05-23 DIAGNOSIS — M5412 Radiculopathy, cervical region: Secondary | ICD-10-CM | POA: Diagnosis not present

## 2021-05-25 DIAGNOSIS — M546 Pain in thoracic spine: Secondary | ICD-10-CM | POA: Diagnosis not present

## 2021-05-25 DIAGNOSIS — M542 Cervicalgia: Secondary | ICD-10-CM | POA: Diagnosis not present

## 2021-05-25 DIAGNOSIS — M79605 Pain in left leg: Secondary | ICD-10-CM | POA: Diagnosis not present

## 2021-05-25 DIAGNOSIS — F259 Schizoaffective disorder, unspecified: Secondary | ICD-10-CM | POA: Diagnosis not present

## 2021-05-25 DIAGNOSIS — H409 Unspecified glaucoma: Secondary | ICD-10-CM | POA: Diagnosis not present

## 2021-05-25 DIAGNOSIS — F331 Major depressive disorder, recurrent, moderate: Secondary | ICD-10-CM | POA: Diagnosis not present

## 2021-05-25 DIAGNOSIS — M5442 Lumbago with sciatica, left side: Secondary | ICD-10-CM | POA: Diagnosis not present

## 2021-05-25 DIAGNOSIS — F419 Anxiety disorder, unspecified: Secondary | ICD-10-CM | POA: Diagnosis not present

## 2021-05-25 DIAGNOSIS — G894 Chronic pain syndrome: Secondary | ICD-10-CM | POA: Diagnosis not present

## 2021-05-30 DIAGNOSIS — M5442 Lumbago with sciatica, left side: Secondary | ICD-10-CM | POA: Diagnosis not present

## 2021-05-30 DIAGNOSIS — M542 Cervicalgia: Secondary | ICD-10-CM | POA: Diagnosis not present

## 2021-05-30 DIAGNOSIS — F331 Major depressive disorder, recurrent, moderate: Secondary | ICD-10-CM | POA: Diagnosis not present

## 2021-05-30 DIAGNOSIS — M546 Pain in thoracic spine: Secondary | ICD-10-CM | POA: Diagnosis not present

## 2021-05-30 DIAGNOSIS — F419 Anxiety disorder, unspecified: Secondary | ICD-10-CM | POA: Diagnosis not present

## 2021-05-30 DIAGNOSIS — H409 Unspecified glaucoma: Secondary | ICD-10-CM | POA: Diagnosis not present

## 2021-05-30 DIAGNOSIS — G894 Chronic pain syndrome: Secondary | ICD-10-CM | POA: Diagnosis not present

## 2021-05-30 DIAGNOSIS — M79605 Pain in left leg: Secondary | ICD-10-CM | POA: Diagnosis not present

## 2021-05-30 DIAGNOSIS — F259 Schizoaffective disorder, unspecified: Secondary | ICD-10-CM | POA: Diagnosis not present

## 2021-06-01 ENCOUNTER — Ambulatory Visit: Payer: Medicare HMO

## 2021-06-01 DIAGNOSIS — S069X3S Unspecified intracranial injury 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, sequela: Secondary | ICD-10-CM

## 2021-06-01 DIAGNOSIS — G894 Chronic pain syndrome: Secondary | ICD-10-CM

## 2021-06-01 DIAGNOSIS — F331 Major depressive disorder, recurrent, moderate: Secondary | ICD-10-CM

## 2021-06-01 DIAGNOSIS — R269 Unspecified abnormalities of gait and mobility: Secondary | ICD-10-CM

## 2021-06-01 DIAGNOSIS — N319 Neuromuscular dysfunction of bladder, unspecified: Secondary | ICD-10-CM

## 2021-06-01 NOTE — Patient Instructions (Signed)
Social Worker Visit Information  Goals we discussed today:   Goals Addressed             This Visit's Progress    Barriers to treatment identified and managed   On track    Timeframe:  Long-Range Goal Priority:  High Start Date:   5.11.22                                   Next planned outreach: 11.10.22            Patient Goals/Self-Care Activities patient will: With the help of his spouse  - Contact SW as needed prior to next scheduled call -Engage with home health PT to evaluate need for an electric wheel chair          Patient verbalizes understanding of instructions provided today and agrees to view in [place].   Follow Up Plan: SW will follow up with patient by phone over the next 60 days   Daneen Schick, BSW, CDP Social Worker, Certified Dementia Practitioner [place] [facility] [place] [facility] [phone]

## 2021-06-01 NOTE — Chronic Care Management (AMB) (Signed)
Chronic Care Management    Social Work Note  06/01/2021 Name: Vincent Farve Sr. MRN: [id number] DOB: 09/21/1965  Vincent Mems Sr. is a 55 y.o. year old male who is a primary care patient of [name], [place]. The CCM team was consulted to assist the patient with chronic disease management and/or care coordination needs related to:  Traumatic Brain Injury, Chronic Pain Syndrome, Neurogenic Bladder, Moderate Episode of Recurrent Major Depressive Disorder .   Engaged with patients spouse by phone  for follow up visit in response to provider referral for social work chronic care management and care coordination services.   Consent to Services:  The patient was given information about Chronic Care Management services, agreed to services, and gave verbal consent prior to initiation of services.  Please see initial visit note for detailed documentation.   Patient agreed to services and consent obtained.   Assessment: Review of patient past medical history, allergies, medications, and health status, including review of relevant consultants reports was performed today as part of a comprehensive evaluation and provision of chronic care management and care coordination services.     SDOH (Social Determinants of Health) assessments and interventions performed:    Advanced Directives Status: Not addressed in this encounter.  CCM Care Plan  No Known Allergies  Outpatient Encounter Medications as of 06/01/2021  Medication Sig   diclofenac sodium (VOLTAREN) 1 % GEL Apply 2 g 4 (four) times daily topically.   docusate sodium (COLACE) 50 MG capsule Take 50 mg by mouth 2 (two) times daily.   furosemide (LASIX) 20 MG tablet TAKE 1 TABLET BY MOUTH EVERY DAY (Patient taking differently: daily as needed.)   gabapentin (NEURONTIN) 600 MG tablet TAKE 1 TABLET THREE TIMES DAILY (Patient taking differently: 2 (two) times daily.)   GNP MAGNESIUM OXIDE 250 MG TABS TAKE 1 TABLET (250 MG TOTAL) BY  MOUTH EVERY EVENING.   latanoprost (XALATAN) 0.005 % ophthalmic solution Place 1 drop into both eyes nightly.   lidocaine (LIDODERM) 5 % Place 1 patch onto the skin daily. Remove & Discard patch within 12 hours or as directed by MD   meloxicam (MOBIC) 7.5 MG tablet Take 1 tablet (7.5 mg total) by mouth daily. (Patient not taking: Reported on 03/02/2021)   methocarbamol (ROBAXIN) 500 MG tablet TAKE 1 TABLET EVERY 8 HOURS AS NEEDED FOR MUSCLE SPASM(S)   naphazoline-glycerin (CLEAR EYES) 0.012-0.2 % SOLN Place 2 drops 4 (four) times daily -  with meals and at bedtime into both eyes.   oxycodone-acetaminophen (PERCOCET) 2.5-325 MG tablet Take 1 tablet by mouth every 4 (four) hours as needed for pain. 5-3.25 per pt wife   sertraline (ZOLOFT) 50 MG tablet Take 75 mg by mouth daily.   tamsulosin (FLOMAX) 0.4 MG CAPS capsule TAKE 1 CAPSULE EVERY DAY   timolol (TIMOPTIC) 0.5 % ophthalmic solution    No facility-administered encounter medications on file as of 06/01/2021.    Patient Active Problem List   Diagnosis Date Noted   Pseudomonas urinary tract infection    Neurogenic bladder    Neck pain    Chronic pain syndrome    Transaminitis    Diffuse TBI w loss of consciousness of unsp duration, init ([place]) 05/21/2017   Tetraplegia ([place]) 05/21/2017   Fracture    Trauma    Polysubstance abuse (HCC)    Fever    Tachycardia    Post-operative pain    Leukocytosis    SIRS (systemic inflammatory response syndrome) ([place])  Acute blood loss anemia    TBI (traumatic brain injury) ([employer]) 05/03/2017   Polysubstance abuse ([place]) 01/12/2017    Conditions to be addressed/monitored:  Traumatic Brain Injury, Chronic Pain Syndrome, Neurogenic Bladder, Moderate Episode of Recurrent Major Depressive Disorder ; Limited access to caregiver  Care Plan : Social Work [facility] Care Plan  Updates made by Daneen Schick since 06/01/2021 12:00 AM     Problem: Barriers to Treatment      Long-Range Goal: Barriers to  Treatment Identified and Managed   Start Date: 01/18/2021  This Visit's Progress: On track  Recent Progress: On track  Priority: High  Note:   Current Barriers:  Chronic disease management support and education needs related to  Traumatic Brain Injury, Chronic Pain Syndrome, Neurogenic Bladder, and Moderate Episode of Recurrent Major Depressive Disorder   Transportation Limited access to a caregiver Social Worker Clinical Goal(s):  patient will work with SW to identify and address any acute and/or chronic care coordination needs related to the self health management of  Traumatic Brain Injury, Chronic Pain Syndrome, Neurogenic Bladder, and Moderate Episode of Recurrent Major Depressive Disorder   patient and his spouse will work with SW to identify resources to assist with transportation patient and his spouse will identify caregiver resource options SW Interventions:  Inter-disciplinary care team collaboration (see longitudinal plan of care) Collaboration with [name], FNP regarding development and update of comprehensive plan of care as evidenced by provider attestation and co-signature Successful outbound call placed to the patients spouse to assist with care coordination needs Discussed Mrs. Schleyer is continuing to recover from a recent hernia repair surgery performed on September 19 Determined the patient continues to have home health services to support his needs in the home Assessed for status of electric wheel chair - Mrs. Reh reports a scheduled in home evaluation for this Friday  Discussed the patients friends are assisting with obtaining items from the grocery store as needed No acute SW needs at this time Scheduled follow up call over the next 60 days  Patient Goals/Self-Care Activities patient will: With the help of his spouse  -  Contact SW as needed prior to next scheduled call -Engage with home health PT to evaluate need for an electric wheel chair  Follow Up  Plan:  SW will follow up with the patient over the next 60 days       Follow Up Plan: SW will follow up with patient by phone over the next 60 days      Daneen Schick, BSW, CDP Social Worker, Certified Dementia Practitioner [place] [facility] [place] [facility] [phone]

## 2021-06-02 DIAGNOSIS — F331 Major depressive disorder, recurrent, moderate: Secondary | ICD-10-CM | POA: Diagnosis not present

## 2021-06-02 DIAGNOSIS — F419 Anxiety disorder, unspecified: Secondary | ICD-10-CM | POA: Diagnosis not present

## 2021-06-02 DIAGNOSIS — M542 Cervicalgia: Secondary | ICD-10-CM | POA: Diagnosis not present

## 2021-06-02 DIAGNOSIS — H409 Unspecified glaucoma: Secondary | ICD-10-CM | POA: Diagnosis not present

## 2021-06-02 DIAGNOSIS — M546 Pain in thoracic spine: Secondary | ICD-10-CM | POA: Diagnosis not present

## 2021-06-02 DIAGNOSIS — M5442 Lumbago with sciatica, left side: Secondary | ICD-10-CM | POA: Diagnosis not present

## 2021-06-02 DIAGNOSIS — G894 Chronic pain syndrome: Secondary | ICD-10-CM | POA: Diagnosis not present

## 2021-06-02 DIAGNOSIS — M79605 Pain in left leg: Secondary | ICD-10-CM | POA: Diagnosis not present

## 2021-06-02 DIAGNOSIS — F259 Schizoaffective disorder, unspecified: Secondary | ICD-10-CM | POA: Diagnosis not present

## 2021-06-05 ENCOUNTER — Encounter: Payer: Medicare HMO | Admitting: Surgery

## 2021-06-05 ENCOUNTER — Encounter (HOSPITAL_COMMUNITY): Payer: Medicare HMO

## 2021-06-05 DIAGNOSIS — Z23 Encounter for immunization: Secondary | ICD-10-CM | POA: Diagnosis not present

## 2021-06-05 DIAGNOSIS — F32A Depression, unspecified: Secondary | ICD-10-CM | POA: Diagnosis not present

## 2021-06-05 DIAGNOSIS — Z79899 Other long term (current) drug therapy: Secondary | ICD-10-CM | POA: Diagnosis not present

## 2021-06-05 DIAGNOSIS — F17210 Nicotine dependence, cigarettes, uncomplicated: Secondary | ICD-10-CM | POA: Diagnosis not present

## 2021-06-05 DIAGNOSIS — M542 Cervicalgia: Secondary | ICD-10-CM | POA: Diagnosis not present

## 2021-06-05 DIAGNOSIS — Z1159 Encounter for screening for other viral diseases: Secondary | ICD-10-CM | POA: Diagnosis not present

## 2021-06-05 DIAGNOSIS — Z6841 Body Mass Index (BMI) 40.0 and over, adult: Secondary | ICD-10-CM | POA: Diagnosis not present

## 2021-06-07 DIAGNOSIS — Z79899 Other long term (current) drug therapy: Secondary | ICD-10-CM | POA: Diagnosis not present

## 2021-06-09 DIAGNOSIS — N319 Neuromuscular dysfunction of bladder, unspecified: Secondary | ICD-10-CM

## 2021-06-09 DIAGNOSIS — F331 Major depressive disorder, recurrent, moderate: Secondary | ICD-10-CM | POA: Diagnosis not present

## 2021-06-09 DIAGNOSIS — Z87820 Personal history of traumatic brain injury: Secondary | ICD-10-CM

## 2021-06-12 ENCOUNTER — Telehealth: Payer: Medicare HMO

## 2021-06-12 ENCOUNTER — Ambulatory Visit (INDEPENDENT_AMBULATORY_CARE_PROVIDER_SITE_OTHER): Payer: Medicare HMO

## 2021-06-12 DIAGNOSIS — F331 Major depressive disorder, recurrent, moderate: Secondary | ICD-10-CM

## 2021-06-12 DIAGNOSIS — G894 Chronic pain syndrome: Secondary | ICD-10-CM

## 2021-06-12 DIAGNOSIS — S069X3S Unspecified intracranial injury 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, sequela: Secondary | ICD-10-CM

## 2021-06-12 DIAGNOSIS — R269 Unspecified abnormalities of gait and mobility: Secondary | ICD-10-CM

## 2021-06-12 DIAGNOSIS — G8929 Other chronic pain: Secondary | ICD-10-CM

## 2021-06-12 DIAGNOSIS — R300 Dysuria: Secondary | ICD-10-CM

## 2021-06-12 DIAGNOSIS — N319 Neuromuscular dysfunction of bladder, unspecified: Secondary | ICD-10-CM

## 2021-06-12 DIAGNOSIS — M542 Cervicalgia: Secondary | ICD-10-CM

## 2021-06-13 ENCOUNTER — Other Ambulatory Visit: Payer: Self-pay | Admitting: *Deleted

## 2021-06-13 DIAGNOSIS — R600 Localized edema: Secondary | ICD-10-CM

## 2021-06-14 ENCOUNTER — Other Ambulatory Visit: Payer: Self-pay | Admitting: Nurse Practitioner

## 2021-06-14 DIAGNOSIS — G894 Chronic pain syndrome: Secondary | ICD-10-CM

## 2021-06-16 DIAGNOSIS — H409 Unspecified glaucoma: Secondary | ICD-10-CM | POA: Diagnosis not present

## 2021-06-16 DIAGNOSIS — M542 Cervicalgia: Secondary | ICD-10-CM | POA: Diagnosis not present

## 2021-06-16 DIAGNOSIS — F259 Schizoaffective disorder, unspecified: Secondary | ICD-10-CM | POA: Diagnosis not present

## 2021-06-16 DIAGNOSIS — M79605 Pain in left leg: Secondary | ICD-10-CM | POA: Diagnosis not present

## 2021-06-16 DIAGNOSIS — G894 Chronic pain syndrome: Secondary | ICD-10-CM | POA: Diagnosis not present

## 2021-06-16 DIAGNOSIS — M546 Pain in thoracic spine: Secondary | ICD-10-CM | POA: Diagnosis not present

## 2021-06-16 DIAGNOSIS — F419 Anxiety disorder, unspecified: Secondary | ICD-10-CM | POA: Diagnosis not present

## 2021-06-16 DIAGNOSIS — M5442 Lumbago with sciatica, left side: Secondary | ICD-10-CM | POA: Diagnosis not present

## 2021-06-16 DIAGNOSIS — F331 Major depressive disorder, recurrent, moderate: Secondary | ICD-10-CM | POA: Diagnosis not present

## 2021-06-20 ENCOUNTER — Other Ambulatory Visit: Payer: Self-pay

## 2021-06-20 DIAGNOSIS — G894 Chronic pain syndrome: Secondary | ICD-10-CM

## 2021-06-25 NOTE — Progress Notes (Signed)
[facility] OF [place]  ASSESSMENT / PLAN: Vincent Hildebran Sr. is a 55 y.o. male with R>L lower extremity edema. Venous duplex is significant for no superficial venous reflux. Reflux noted only at saphenofemoral junction. Recommend compression and elevation for symptomatic relief. Follow up with me as needed.  CHIEF COMPLAINT: swelling in RLE  HISTORY OF PRESENT ILLNESS: Vincent Rodelo Sr. is a 55 y.o. male referred to clinic for evaluation of possible chronic venous insufficiency.  The patient is minimally ambulatory, and gets around possibly in a wheelchair.  He is able to use exercise equipment at low intensities.  He notices left leg swells occasionally.  He notices the leg is swollen when he is on it more often.  Leg swelling is not terribly symptomatic to him.  He does report some healing wounds on his pretibial skin, but reports these are from trauma.  Past Medical History:  Diagnosis Date   Brain injury    Chronic back pain    History of suicidal ideation 01/2017   with overdose/ [place] admission   Lumbago with sciatica    left side   Vitamin D deficiency     Past Surgical History:  Procedure Laterality Date   BACK SURGERY     yrs ago   CLOSED REDUCTION MANDIBLE N/A 05/07/2017   Procedure: CLOSED REDUCTION MANDIBULAR;  Surgeon: Melida Quitter, MD;  Location: [facility] OR;  Service: ENT;  Laterality: N/A;   ESOPHAGOGASTRODUODENOSCOPY N/A 05/07/2017   Procedure: ESOPHAGOGASTRODUODENOSCOPY (EGD);  Surgeon: Georganna Skeans, MD;  Location: [place];  Service: General;  Laterality: N/A;   EXTERNAL FIXATION LEG Left 05/03/2017   Procedure: EXTERNAL FIXATION LEFT LOWER LEG;  Surgeon: Meredith Pel, MD;  Location: [place];  Service: Orthopedics;  Laterality: Left;   EXTERNAL FIXATION REMOVAL Left 05/16/2017   Procedure: REMOVAL EXTERNAL FIXATION LEG;  Surgeon: Shona Needles, MD;  Location: [place];  Service: Orthopedics;  Laterality: Left;   FEMUR IM NAIL Left  05/03/2017   Procedure: LEFT INTRAMEDULLARY (IM) NAIL FEMORAL;  Surgeon: Meredith Pel, MD;  Location: [place];  Service: Orthopedics;  Laterality: Left;   I & D EXTREMITY Left 05/03/2017   Procedure: IRRIGATION AND DEBRIDEMENT LEFT LOWER EXTREMITY;  Surgeon: Meredith Pel, MD;  Location: [place];  Service: Orthopedics;  Laterality: Left;   I & D EXTREMITY Left 05/07/2017   Procedure: IRRIGATION AND DEBRIDEMENT, LEFT TIBIA  EX-FIX ADJUSTMENT;  Surgeon: Shona Needles, MD;  Location: [place];  Service: Orthopedics;  Laterality: Left;   INCISION AND DRAINAGE OF WOUND Right 05/03/2017   Procedure: IRRIGATION AND DEBRIDEMENT WOUND;  Surgeon: Meredith Pel, MD;  Location: [place];  Service: Orthopedics;  Laterality: Right;   MANDIBULAR HARDWARE REMOVAL N/A 06/26/2017   Procedure: MANDIBULAR HARDWARE REMOVAL;  Surgeon: Melida Quitter, MD;  Location: [place];  Service: ENT;  Laterality: N/A;   ORIF TIBIA FRACTURE Left 05/16/2017   Procedure: OPEN REDUCTION INTERNAL FIXATION (ORIF) LEFT DISTAL TIBIA FRACTURE, REMOVAL OF EX-FIX;  Surgeon: Shona Needles, MD;  Location: [place];  Service: Orthopedics;  Laterality: Left;   PEG PLACEMENT N/A 05/07/2017   Procedure: PERCUTANEOUS ENDOSCOPIC GASTROSTOMY (PEG) PLACEMENT;  Surgeon: Georganna Skeans, MD;  Location: [place];  Service: General;  Laterality: N/A;   POSTERIOR CERVICAL FUSION/FORAMINOTOMY N/A 05/10/2017   Procedure: POSTERIOR CERVICAL FUSION OCCIPUT- CERVICAL FOUR;  Surgeon: Consuella Lose, MD;  Location: [place];  Service: Neurosurgery;  Laterality: N/A;   TRACHEOSTOMY TUBE PLACEMENT N/A 05/07/2017  Procedure: TRACHEOSTOMY;  Surgeon: Melida Quitter, MD;  Location: [facility] OR;  Service: ENT;  Laterality: N/A;    Family History  Problem Relation Age of Onset   Hypothyroidism Mother    Hypertension Father     Social History   Socioeconomic History   Marital status: Married    Spouse name: [name]   Number of children: 4   Years of education: 12    Highest education level: Not on file  Occupational History    Comment: unemployed  Tobacco Use   Smoking status: Every Day    Packs/day: 0.50    Years: 8.00    Pack years: 4.00    Types: Cigarettes   Smokeless tobacco: Never  Vaping Use   Vaping Use: Every day  Substance and Sexual Activity   Alcohol use: Not Currently   Drug use: Not Currently   Sexual activity: Not on file  Other Topics Concern   Not on file  Social History Narrative   ** Merged History Encounter **       Lives at home wife, children Caffeine use- coffee 2-3 cups    Social Determinants of Health   Financial Resource Strain: Low Risk    Difficulty of Paying Living Expenses: Not very hard  Food Insecurity: No Food Insecurity   Worried About Charity fundraiser in the Last Year: Never true   Ran Out of Food in the Last Year: Never true  Transportation Needs: No Transportation Needs   Lack of Transportation (Medical): No   Lack of Transportation (Non-Medical): No  Physical Activity: Inactive   Days of Exercise per Week: 0 days   Minutes of Exercise per Session: 0 min  Stress: Stress Concern Present   Feeling of Stress : Very much  Social Connections: Not on file  Intimate Partner Violence: Not on file    No Known Allergies  Current Outpatient Medications  Medication Sig Dispense Refill   diclofenac sodium (VOLTAREN) 1 % GEL Apply 2 g 4 (four) times daily topically. 4 Tube 0   furosemide (LASIX) 20 MG tablet TAKE 1 TABLET BY MOUTH EVERY DAY (Patient taking differently: daily as needed.) 90 tablet 1   gabapentin (NEURONTIN) 600 MG tablet TAKE 1 TABLET THREE TIMES DAILY (Patient taking differently: 2 (two) times daily.) 90 tablet 3   GNP MAGNESIUM OXIDE 250 MG TABS TAKE 1 TABLET (250 MG TOTAL) BY MOUTH EVERY EVENING. 90 tablet 1   latanoprost (XALATAN) 0.005 % ophthalmic solution Place 1 drop into both eyes nightly.     lidocaine (LIDODERM) 5 % Place 1 patch onto the skin daily. Remove & Discard patch  within 12 hours or as directed by MD 30 patch 0   meloxicam (MOBIC) 7.5 MG tablet Take 1 tablet (7.5 mg total) by mouth daily. 90 tablet 1   methocarbamol (ROBAXIN) 500 MG tablet TAKE 1 TABLET EVERY 8 HOURS AS NEEDED FOR MUSCLE SPASM(S) 30 tablet 0   naphazoline-glycerin (CLEAR EYES) 0.012-0.2 % SOLN Place 2 drops 4 (four) times daily -  with meals and at bedtime into both eyes. 30 mL 0   oxycodone-acetaminophen (PERCOCET) 2.5-325 MG tablet Take 1 tablet by mouth every 4 (four) hours as needed for pain. 5-3.25 per pt wife     sertraline (ZOLOFT) 50 MG tablet Take 75 mg by mouth daily.     tamsulosin (FLOMAX) 0.4 MG CAPS capsule TAKE 1 CAPSULE EVERY DAY 90 capsule 1   timolol (TIMOPTIC) 0.5 % ophthalmic solution  docusate sodium (COLACE) 50 MG capsule Take 50 mg by mouth 2 (two) times daily. (Patient not taking: Reported on 06/27/2021)     No current facility-administered medications for this visit.    REVIEW OF SYSTEMS:  [X]  denotes positive finding, [ ]  denotes negative finding Cardiac  Comments:  Chest pain or chest pressure:    Shortness of breath upon exertion:    Short of breath when lying flat:    Irregular heart rhythm:        Vascular    Pain in calf, thigh, or hip brought on by ambulation:    Pain in feet at night that wakes you up from your sleep:     Blood clot in your veins:    Leg swelling:         Pulmonary    Oxygen at home:    Productive cough:     Wheezing:         Neurologic    Sudden weakness in arms or legs:     Sudden numbness in arms or legs:     Sudden onset of difficulty speaking or slurred speech:    Temporary loss of vision in one eye:     Problems with dizziness:         Gastrointestinal    Blood in stool:     Vomited blood:         Genitourinary    Burning when urinating:     Blood in urine:        Psychiatric    Major depression:         Hematologic    Bleeding problems:    Problems with blood clotting too easily:        Skin     Rashes or ulcers:        Constitutional    Fever or chills:      PHYSICAL EXAM Vitals:   06/27/21 1517  BP: 120/76  Pulse: 66  Resp: 20  Temp: 98.7 F (37.1 C)  SpO2: 97%  Weight: 247 lb (112 kg)  Height: 5\' 5"  (1.651 m)    Constitutional: well appearing. no distress. Appears well nourished.  Neurologic: CN intact. no focal findings. no sensory loss. In wheelchair. Psychiatric:  Mood and affect symmetric and appropriate. Eyes:  No icterus. No conjunctival pallor. Ears, nose, throat:  mucous membranes moist. Midline trachea.  Cardiac: regular rate and rhythm.  Respiratory:  unlabored. Abdominal:  soft, non-tender, non-distended.  Peripheral vascular: palpable pedal pulses. Extremity: trace RLE edema. no cyanosis. no pallor.  Skin: no gangrene. Mild, healing ulceration about right anterior tibial skin.  Lymphatic: no Stemmer's sign. no palpable lymphadenopathy.  PERTINENT LABORATORY AND RADIOLOGIC DATA  Most recent CBC CBC Latest Ref Rng & Units 12/22/2020 03/16/2020 04/01/2019  WBC 3.4 - 10.8 x10E3/uL 5.7 6.4 5.1  Hemoglobin 13.0 - 17.7 g/dL 13.7 13.2 14.2  Hematocrit 37.5 - 51.0 % 41.1 41.5 44.4  Platelets 150 - 450 x10E3/uL 206 207 236     Most recent CMP CMP Latest Ref Rng & Units 12/22/2020 03/16/2020 04/01/2019  Glucose 65 - 99 mg/dL 118(H) 117(H) 116(H)  BUN 6 - 24 mg/dL 7 13 8   Creatinine 0.76 - 1.27 mg/dL 0.85 0.81 0.75(L)  Sodium 134 - 144 mmol/L 138 139 141  Potassium 3.5 - 5.2 mmol/L 4.4 4.5 4.9  Chloride 96 - 106 mmol/L 101 102 103  CO2 20 - 29 mmol/L 22 26 24   Calcium 8.7 - 10.2 mg/dL 9.5 9.3  9.7  Total Protein 6.0 - 8.5 g/dL 7.0 7.0 7.1  Total Bilirubin 0.0 - 1.2 mg/dL 0.4 0.4 0.4  Alkaline Phos 44 - 121 IU/L 98 104 89  AST 0 - 40 IU/L 28 12 21   ALT 0 - 44 IU/L 39 16 21    Renal function CrCl cannot be calculated (Patient's most recent lab result is older than the maximum 21 days allowed.).  Hgb A1c MFr Bld (%)  Date Value  05/22/2017 5.7 (H)     LDL Cholesterol  Date Value Ref Range Status  01/12/2017 130 (H) 0 - 99 mg/dL Final    Comment:           Total Cholesterol/HDL:CHD Risk Coronary Heart Disease Risk Table                     Men   Women  1/2 Average Risk   3.4   3.3  Average Risk       5.0   4.4  2 X Average Risk   9.6   7.1  3 X Average Risk  23.4   11.0        Use the calculated Patient Ratio above and the CHD Risk Table to determine the patient's CHD Risk.        ATP III CLASSIFICATION (LDL):  <100     mg/dL   Optimal  100-129  mg/dL   Near or Above                    Optimal  130-159  mg/dL   Borderline  160-189  mg/dL   High  >190     mg/dL   Very High Performed at [facility] [address] [place], [place] [address]     Lower Venous Reflux Study   Patient Name:  Vincent Boulet Sr.  Date of Exam:   06/27/2021  Medical Rec #: [id number]                  Accession #:    [id number]  Date of Birth: 31-Aug-1966                  Patient Gender: M  Patient Age:   60 years  Exam Location:  [facility]  Procedure:      VAS [place] LOWER EXTREMITY VENOUS REFLUX  Referring Phys: Jamelle Haring    ---------------------------------------------------------------------------  -----     Indications: Swelling, and Pain.      Comparison Study: 12/26/2020: No evidence of deep vein thrombosis  bilaterally.   Performing Technologist: Ivan Croft      Examination Guidelines: A complete evaluation includes B-mode imaging,  spectral  Doppler, color Doppler, and power Doppler as needed of all accessible  portions  of each vessel. Bilateral testing is considered an integral part of a  complete  examination. Limited examinations for reoccurring indications may be  performed  as noted. The reflux portion of the exam is performed with the patient in  reverse Trendelenburg.  Significant venous reflux is defined as >500 ms in the superficial venous  system, and >1 second in the deep  venous system.      +--------------+---------+------+-----------+------------+--------+  LEFT          Reflux NoRefluxReflux TimeDiameter cmsComments                          Yes                                   +--------------+---------+------+-----------+------------+--------+  CFV           no                                              +--------------+---------+------+-----------+------------+--------+  FV mid        no                                              +--------------+---------+------+-----------+------------+--------+  Popliteal     no                                              +--------------+---------+------+-----------+------------+--------+  GSV at SFJ              yes    >500 ms      0.80              +--------------+---------+------+-----------+------------+--------+  GSV prox thighno                            0.42              +--------------+---------+------+-----------+------------+--------+  GSV mid thigh no                            0.42              +--------------+---------+------+-----------+------------+--------+  GSV dist thighno                            0.38              +--------------+---------+------+-----------+------------+--------+  GSV at knee   no                            0.41              +--------------+---------+------+-----------+------------+--------+  GSV prox calf no                            0.31              +--------------+---------+------+-----------+------------+--------+  SSV Pop Fossa no                            0.25              +--------------+---------+------+-----------+------------+--------+  SSV prox calf no                            0.24              +--------------+---------+------+-----------+------------+--------+  SSV mid calf  no                            0.18               +--------------+---------+------+-----------+------------+--------+           Summary:  Left:  - No evidence of deep vein thrombosis seen in the left lower extremity,  from the common femoral through the popliteal veins.  - No evidence of superficial venous thrombosis in the left lower  extremity.     - Venous reflux is noted in the left sapheno-femoral junction.     *See table(s) above for measurements and observations.  Yevonne Aline. Stanford Breed, MD [facility] Phone Number: [phone] 06/27/2021 5:43 PM  Total time spent on preparing this encounter including chart review, data review, collecting history, examining the patient, coordinating care for this new patient, 60 minutes.  Portions of this report may have been transcribed using voice recognition software.  Every effort has been made to ensure accuracy; however, inadvertent computerized transcription errors may still be present.

## 2021-06-27 ENCOUNTER — Other Ambulatory Visit: Payer: Self-pay

## 2021-06-27 ENCOUNTER — Ambulatory Visit: Payer: Medicare HMO | Admitting: Vascular Surgery

## 2021-06-27 ENCOUNTER — Encounter: Payer: Self-pay | Admitting: Vascular Surgery

## 2021-06-27 ENCOUNTER — Ambulatory Visit (HOSPITAL_COMMUNITY)
Admission: RE | Admit: 2021-06-27 | Discharge: 2021-06-27 | Disposition: A | Discharge status: Home / Self Care | Payer: Medicare HMO | Source: Ambulatory Visit | Attending: Vascular Surgery | Admitting: Vascular Surgery

## 2021-06-27 VITALS — BP 120/76 | HR 66 | Temp 98.7°F | Resp 20 | Ht 65.0 in | Wt 247.0 lb

## 2021-06-27 DIAGNOSIS — R600 Localized edema: Secondary | ICD-10-CM | POA: Diagnosis not present

## 2021-06-29 ENCOUNTER — Ambulatory Visit (INDEPENDENT_AMBULATORY_CARE_PROVIDER_SITE_OTHER): Payer: Medicare HMO | Admitting: Nurse Practitioner

## 2021-06-29 ENCOUNTER — Encounter: Payer: Self-pay | Admitting: Nurse Practitioner

## 2021-06-29 ENCOUNTER — Ambulatory Visit: Payer: Medicare HMO | Admitting: Nurse Practitioner

## 2021-06-29 ENCOUNTER — Other Ambulatory Visit: Payer: Self-pay

## 2021-06-29 VITALS — BP 102/80 | HR 65 | Temp 98.1°F | Ht 65.0 in | Wt 244.2 lb

## 2021-06-29 DIAGNOSIS — G8929 Other chronic pain: Secondary | ICD-10-CM | POA: Diagnosis not present

## 2021-06-29 DIAGNOSIS — Z23 Encounter for immunization: Secondary | ICD-10-CM | POA: Diagnosis not present

## 2021-06-29 DIAGNOSIS — M5442 Lumbago with sciatica, left side: Secondary | ICD-10-CM | POA: Diagnosis not present

## 2021-06-29 DIAGNOSIS — L89152 Pressure ulcer of sacral region, stage 2: Secondary | ICD-10-CM | POA: Diagnosis not present

## 2021-06-29 DIAGNOSIS — Z1211 Encounter for screening for malignant neoplasm of colon: Secondary | ICD-10-CM | POA: Diagnosis not present

## 2021-06-29 DIAGNOSIS — G894 Chronic pain syndrome: Secondary | ICD-10-CM

## 2021-06-29 DIAGNOSIS — F331 Major depressive disorder, recurrent, moderate: Secondary | ICD-10-CM

## 2021-06-29 DIAGNOSIS — Z8659 Personal history of other mental and behavioral disorders: Secondary | ICD-10-CM | POA: Diagnosis not present

## 2021-06-29 DIAGNOSIS — M542 Cervicalgia: Secondary | ICD-10-CM

## 2021-06-29 NOTE — Patient Instructions (Signed)
Visit Information  PATIENT GOALS:  Goals Addressed      Maintain Functional Ability       Timeframe:  Long-Range Goal Priority:  High Start Date:  05/12/21                           Expected End Date:  11/09/21  Next Scheduled follow up date: 08/11/21  Patient Goals: - work with in home PT  - complete PT evaluation for power W/C                        The patient verbalized understanding of instructions, educational materials, and care plan provided today and declined offer to receive copy of patient instructions, educational materials, and care plan.   Telephone follow up appointment with care management team member scheduled for: 08/11/21  Barb Merino, RN, BSN, CCM Care Management Coordinator [place] [facility]  Direct Phone: [phone]

## 2021-06-29 NOTE — Chronic Care Management (AMB) (Signed)
Chronic Care Management   CCM RN Visit Note  06/12/2021 Name: Vincent Halls Sr. MRN: [id number] DOB: 1966-08-17  Subjective: Vincent Mems Sr. is a 55 y.o. year old male who is a primary care patient of [name], [place]. The care management team was consulted for assistance with disease management and care coordination needs.    Engaged with patient by telephone for follow up visit in response to provider referral for case management and/or care coordination services.   Consent to Services:  The patient was given information about Chronic Care Management services, agreed to services, and gave verbal consent prior to initiation of services.  Please see initial visit note for detailed documentation.   Patient agreed to services and verbal consent obtained.   Assessment: Review of patient past medical history, allergies, medications, health status, including review of consultants reports, laboratory and other test data, was performed as part of comprehensive evaluation and provision of chronic care management services.   SDOH (Social Determinants of Health) assessments and interventions performed:    CCM Care Plan  No Known Allergies  Outpatient Encounter Medications as of 06/12/2021  Medication Sig   diclofenac sodium (VOLTAREN) 1 % GEL Apply 2 g 4 (four) times daily topically.   docusate sodium (COLACE) 50 MG capsule Take 50 mg by mouth 2 (two) times daily. (Patient not taking: Reported on 06/27/2021)   furosemide (LASIX) 20 MG tablet TAKE 1 TABLET BY MOUTH EVERY DAY (Patient taking differently: daily as needed.)   gabapentin (NEURONTIN) 600 MG tablet TAKE 1 TABLET THREE TIMES DAILY (Patient taking differently: 2 (two) times daily.)   GNP MAGNESIUM OXIDE 250 MG TABS TAKE 1 TABLET (250 MG TOTAL) BY MOUTH EVERY EVENING.   latanoprost (XALATAN) 0.005 % ophthalmic solution Place 1 drop into both eyes nightly.   lidocaine (LIDODERM) 5 % Place 1 patch onto the skin daily.  Remove & Discard patch within 12 hours or as directed by MD   meloxicam (MOBIC) 7.5 MG tablet Take 1 tablet (7.5 mg total) by mouth daily.   naphazoline-glycerin (CLEAR EYES) 0.012-0.2 % SOLN Place 2 drops 4 (four) times daily -  with meals and at bedtime into both eyes.   oxycodone-acetaminophen (PERCOCET) 2.5-325 MG tablet Take 1 tablet by mouth every 4 (four) hours as needed for pain. 5-3.25 per pt wife   sertraline (ZOLOFT) 50 MG tablet Take 75 mg by mouth daily.   tamsulosin (FLOMAX) 0.4 MG CAPS capsule TAKE 1 CAPSULE EVERY DAY   timolol (TIMOPTIC) 0.5 % ophthalmic solution    [DISCONTINUED] methocarbamol (ROBAXIN) 500 MG tablet TAKE 1 TABLET EVERY 8 HOURS AS NEEDED FOR MUSCLE SPASM(S)   No facility-administered encounter medications on file as of 06/12/2021.    Patient Active Problem List   Diagnosis Date Noted   Pseudomonas urinary tract infection    Neurogenic bladder    Neck pain    Chronic pain syndrome    Transaminitis    Diffuse TBI w loss of consciousness of unsp duration, init ([place]) 05/21/2017   Tetraplegia ([place]) 05/21/2017   Fracture    Trauma    Polysubstance abuse (HCC)    Fever    Tachycardia    Post-operative pain    Leukocytosis    SIRS (systemic inflammatory response syndrome) ([place])    Acute blood loss anemia    TBI (traumatic brain injury) 05/03/2017   Polysubstance abuse ([place]) 01/12/2017    Conditions to be addressed/monitored: Traumatic brain injury, with loss of consciousness of 1 hour  to 5 hours 59 minutes, Chronic Pain Syndrome, Neurogenic bladder, Dysuria, Chronic neck pain, Moderate episode of recurrent major depressive disorder   Care Plan : Impaired Physical Mobility  Updates made by Lynne Logan, RN since 06/29/2021 12:00 AM     Problem: Impaired Physical Mobility   Priority: High     Long-Range Goal: Maintain functional ability   Start Date: 05/12/2021  Expected End Date: 11/09/2021  Recent Progress: On track  Priority: High  Note:    Current Barriers:  Ineffective Self Health Maintenance in a patient with Traumatic brain injury, 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, Chronic Pain Syndrome, Neurogenic bladder, Dysuria, Chronic neck pain, Moderate episode of recurrent major depressive disorder  Unable to self administer medications as prescribed Unable to perform ADLs independently Unable to perform IADLs independently Impaired Physical Mobility  Clinical Goal(s):  Collaboration with [name], FNP regarding development and update of comprehensive plan of care as evidenced by provider attestation and co-signature Inter-disciplinary care team collaboration (see longitudinal plan of care) patient will work with care management team to address care coordination and chronic disease management needs related to Disease Management Educational Needs Care Coordination Medication Management and Education Medication Reconciliation Psychosocial Support Caregiver Stress support   Interventions:  06/12/21 completed inbound call with wife [name] Evaluation of current treatment plan related to  Impaired Physical Mobility  , self-management and patient's adherence to plan as established by provider. Collaboration with [name], FNP regarding development and update of comprehensive plan of care as evidenced by provider attestation       and co-signature Inter-disciplinary care team collaboration (see longitudinal plan of care) Discussed patient continues to receive in home PT with good results Determined patient's power W/C is in progress through [place] Encouraged wife to reinforce the importance of adhering to the HEP for ongoing support for strengthening and endurance Instructed to notify this RN CM if additional assistance is needed for Coordination of DME  Discussed plans with patient for ongoing care management follow up and provided patient with direct contact information for care  management team Self Care Activities:  Attends all scheduled provider appointments Calls pharmacy for medication refills Calls provider office for new concerns or questions Patient Goals: - work with in home PT  - complete PT evaluation for power W/C  Follow Up Plan: Telephone follow up appointment with care management team member scheduled for: 08/11/21      Plan:Telephone follow up appointment with care management team member scheduled for:  08/11/21  Barb Merino, RN, BSN, CCM Care Management Coordinator [place] [facility]  Direct Phone: [phone]

## 2021-06-29 NOTE — Patient Instructions (Addendum)
Depression Screening Depression screening is a tool that your health care provider can use to learn if you have symptoms of depression. Depression is a common condition with many symptoms that are also often found in other conditions. Depression is treatable, but it must first be diagnosed. You may not know that certain feelings, thoughts, and behaviors that you are having can be symptoms of depression. Taking a depression screening test can help you and your health care provider decide if you need more assessment, or if you should be referred to a mental health care provider. What are the screening tests? You may have a physical exam to see if another condition is affecting your mental health. You may have a blood or urine sample taken during the physical exam. You may be interviewed or offered a written test using a screening tool that was developed from research, such as one of these: Patient Health Questionnaire (PHQ). This is a set of either 2 or 9 questions. A health care provider who has been trained to score this screening test uses a guide to assess if your symptoms suggest that you may have depression. Hamilton Depression Rating Scale (HAM-D). This is a set of either 17 or 24 questions. You may be asked to take it again during or after your treatment, to see if your depression has gotten better. Beck Depression Inventory (BDI). This is a set of 21 multiple choice questions. Your health care provider scores your answers to assess: Your level of depression, ranging from mild to severe. Your response to treatment. Your health care provider may talk with you about your daily activities, such as eating, sleeping, work, and recreation, and ask if you have had any changes in activity. Your health care provider may ask you to see a mental health specialist, such as a psychiatrist or psychologist, for more evaluation. Who should be screened for depression?  All adults, including adults with a family  history of a mental health disorder. People who are 10-21 years old. People who are recovering from an acute condition, such as myocardial infarction (MI) or stroke. Pregnant women, or women who have given birth. People who have a long-term (chronic) illness. Anyone who has been diagnosed with another type of mental health disorder. Anyone who has symptoms that could show depression. What do my results mean? Your health care provider will review the results of your depression screening, physical exam, and lab tests. Positive screens suggest that you may have depression. Screening is the first step in getting the care that you may need. It will be important for you to know the results of your tests. Ask your health care provider, or the department that is doing your screening tests, when your results will be ready. Talk with your health care provider about your results, diagnosis, and recommendations for follow-up. A diagnosis of depression is made using information from the Diagnostic and Statistical Manual of Mental Disorders (DSM-5). This is a book that lists the number and type of symptoms that must be present for a health care provider to give a specific diagnosis. Your health care provider may work with you to treat your symptoms of depression, or your health care provider may help you find a mental health provider who can assess and help you develop a plan to treat your depression. Get help right away if: You have thoughts about hurting yourself or others. If you ever feel like you may hurt yourself or others, or have thoughts about taking your own life,   get help right away. Go to your nearest emergency department or: Call your local emergency services (911 in the [place] Call a suicide crisis helpline, such as the [facility] at 336-413-9223. This is open 24 hours a day in the [place] Text the Crisis Text Line at 705-876-4211 (in the [place].). Summary Depression screening is  the first step in getting the help that you may need. If your screening test shows symptoms of depression (is positive), your health care provider may ask you to see a mental health provider who will help identify ways to treat your depression. Anyone aged 18 or older should be screened for depression. This information is not intended to replace advice given to you by your health care provider. Make sure you discuss any questions you have with your health care provider. Document Revised: 12/05/2020 Document Reviewed: 12/05/2020 Elsevier Patient Education  2022 [place] - call for appt for psychiatry help and counseling
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
## 2021-07-04 DIAGNOSIS — M129 Arthropathy, unspecified: Secondary | ICD-10-CM | POA: Diagnosis not present

## 2021-07-04 DIAGNOSIS — R0602 Shortness of breath: Secondary | ICD-10-CM | POA: Diagnosis not present

## 2021-07-04 DIAGNOSIS — Z6841 Body Mass Index (BMI) 40.0 and over, adult: Secondary | ICD-10-CM | POA: Diagnosis not present

## 2021-07-04 DIAGNOSIS — F32A Depression, unspecified: Secondary | ICD-10-CM | POA: Diagnosis not present

## 2021-07-04 DIAGNOSIS — E559 Vitamin D deficiency, unspecified: Secondary | ICD-10-CM | POA: Diagnosis not present

## 2021-07-04 DIAGNOSIS — Z23 Encounter for immunization: Secondary | ICD-10-CM | POA: Diagnosis not present

## 2021-07-04 DIAGNOSIS — Z113 Encounter for screening for infections with a predominantly sexual mode of transmission: Secondary | ICD-10-CM | POA: Diagnosis not present

## 2021-07-04 DIAGNOSIS — F17210 Nicotine dependence, cigarettes, uncomplicated: Secondary | ICD-10-CM | POA: Diagnosis not present

## 2021-07-04 DIAGNOSIS — Z79899 Other long term (current) drug therapy: Secondary | ICD-10-CM | POA: Diagnosis not present

## 2021-07-04 DIAGNOSIS — Z0000 Encounter for general adult medical examination without abnormal findings: Secondary | ICD-10-CM | POA: Diagnosis not present

## 2021-07-04 DIAGNOSIS — Z114 Encounter for screening for human immunodeficiency virus [HIV]: Secondary | ICD-10-CM | POA: Diagnosis not present

## 2021-07-04 DIAGNOSIS — R5383 Other fatigue: Secondary | ICD-10-CM | POA: Diagnosis not present

## 2021-07-04 DIAGNOSIS — Z125 Encounter for screening for malignant neoplasm of prostate: Secondary | ICD-10-CM | POA: Diagnosis not present

## 2021-07-04 DIAGNOSIS — Z1159 Encounter for screening for other viral diseases: Secondary | ICD-10-CM | POA: Diagnosis not present

## 2021-07-04 DIAGNOSIS — M542 Cervicalgia: Secondary | ICD-10-CM | POA: Diagnosis not present

## 2021-07-05 DIAGNOSIS — Z8659 Personal history of other mental and behavioral disorders: Secondary | ICD-10-CM | POA: Diagnosis not present

## 2021-07-05 DIAGNOSIS — M5442 Lumbago with sciatica, left side: Secondary | ICD-10-CM | POA: Diagnosis not present

## 2021-07-05 DIAGNOSIS — G894 Chronic pain syndrome: Secondary | ICD-10-CM | POA: Diagnosis not present

## 2021-07-05 DIAGNOSIS — Z1211 Encounter for screening for malignant neoplasm of colon: Secondary | ICD-10-CM | POA: Diagnosis not present

## 2021-07-05 DIAGNOSIS — Z23 Encounter for immunization: Secondary | ICD-10-CM | POA: Diagnosis not present

## 2021-07-05 DIAGNOSIS — M542 Cervicalgia: Secondary | ICD-10-CM | POA: Diagnosis not present

## 2021-07-05 DIAGNOSIS — G8929 Other chronic pain: Secondary | ICD-10-CM | POA: Diagnosis not present

## 2021-07-05 DIAGNOSIS — L89152 Pressure ulcer of sacral region, stage 2: Secondary | ICD-10-CM | POA: Diagnosis not present

## 2021-07-05 DIAGNOSIS — F331 Major depressive disorder, recurrent, moderate: Secondary | ICD-10-CM | POA: Diagnosis not present

## 2021-07-06 ENCOUNTER — Encounter: Payer: Self-pay | Admitting: Nurse Practitioner

## 2021-07-06 DIAGNOSIS — Z79899 Other long term (current) drug therapy: Secondary | ICD-10-CM | POA: Diagnosis not present

## 2021-07-10 DIAGNOSIS — F331 Major depressive disorder, recurrent, moderate: Secondary | ICD-10-CM

## 2021-07-10 DIAGNOSIS — N319 Neuromuscular dysfunction of bladder, unspecified: Secondary | ICD-10-CM | POA: Diagnosis not present

## 2021-07-10 DIAGNOSIS — G8929 Other chronic pain: Secondary | ICD-10-CM

## 2021-07-11 ENCOUNTER — Other Ambulatory Visit: Payer: Self-pay | Admitting: Nurse Practitioner

## 2021-07-20 ENCOUNTER — Ambulatory Visit (INDEPENDENT_AMBULATORY_CARE_PROVIDER_SITE_OTHER): Payer: Medicare HMO

## 2021-07-20 DIAGNOSIS — G894 Chronic pain syndrome: Secondary | ICD-10-CM

## 2021-07-20 DIAGNOSIS — F331 Major depressive disorder, recurrent, moderate: Secondary | ICD-10-CM

## 2021-07-20 DIAGNOSIS — S069X3S Unspecified intracranial injury 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, sequela: Secondary | ICD-10-CM

## 2021-07-20 DIAGNOSIS — N319 Neuromuscular dysfunction of bladder, unspecified: Secondary | ICD-10-CM

## 2021-07-20 NOTE — Chronic Care Management (AMB) (Signed)
Chronic Care Management    Social Work Note  07/20/2021 Name: Vincent Magnan Sr. MRN: [id number] DOB: 01/25/1966  Vincent Mems Sr. is a 55 y.o. year old male who is a primary care patient of [name], [place]. The CCM team was consulted to assist the patient with chronic disease management and/or care coordination needs related to:  Traumatic Brain Injury, Chronic Pain Syndrome, Neurogenic Bladder, Moderate Episode of Recurrent Major Depressive Disorder .   Engaged with patients spouse and caregiver [name]  for follow up visit in response to provider referral for social work chronic care management and care coordination services.   Consent to Services:  The patient was given information about Chronic Care Management services, agreed to services, and gave verbal consent prior to initiation of services.  Please see initial visit note for detailed documentation.   Patient agreed to services and consent obtained.   Assessment: Review of patient past medical history, allergies, medications, and health status, including review of relevant consultants reports was performed today as part of a comprehensive evaluation and provision of chronic care management and care coordination services.     SDOH (Social Determinants of Health) assessments and interventions performed:    Advanced Directives Status: Not addressed in this encounter.  CCM Care Plan  No Known Allergies  Outpatient Encounter Medications as of 07/20/2021  Medication Sig   diclofenac sodium (VOLTAREN) 1 % GEL Apply 2 g 4 (four) times daily topically.   docusate sodium (COLACE) 50 MG capsule Take 50 mg by mouth 2 (two) times daily. (Patient not taking: Reported on 06/27/2021)   furosemide (LASIX) 20 MG tablet TAKE 1 TABLET BY MOUTH EVERY DAY (Patient taking differently: daily as needed.)   gabapentin (NEURONTIN) 600 MG tablet TAKE 1 TABLET THREE TIMES DAILY (Patient taking differently: 2 (two) times daily.)    GNP MAGNESIUM OXIDE 250 MG TABS TAKE 1 TABLET (250 MG TOTAL) BY MOUTH EVERY EVENING.   latanoprost (XALATAN) 0.005 % ophthalmic solution Place 1 drop into both eyes nightly.   lidocaine (LIDODERM) 5 % Place 1 patch onto the skin daily. Remove & Discard patch within 12 hours or as directed by MD   meloxicam (MOBIC) 7.5 MG tablet Take 1 tablet (7.5 mg total) by mouth daily.   methocarbamol (ROBAXIN) 500 MG tablet TAKE 1 TABLET EVERY 8 HOURS AS NEEDED FOR MUSCLE SPASM(S)   naphazoline-glycerin (CLEAR EYES) 0.012-0.2 % SOLN Place 2 drops 4 (four) times daily -  with meals and at bedtime into both eyes.   oxycodone-acetaminophen (PERCOCET) 2.5-325 MG tablet Take 1 tablet by mouth every 4 (four) hours as needed for pain. 5-3.25 per pt wife   sertraline (ZOLOFT) 50 MG tablet Take 75 mg by mouth daily.   tamsulosin (FLOMAX) 0.4 MG CAPS capsule TAKE 1 CAPSULE EVERY DAY   timolol (TIMOPTIC) 0.5 % ophthalmic solution    No facility-administered encounter medications on file as of 07/20/2021.    Patient Active Problem List   Diagnosis Date Noted   Pseudomonas urinary tract infection    Neurogenic bladder    Neck pain    Chronic pain syndrome    Transaminitis    Diffuse TBI w loss of consciousness of unsp duration, init ([place]) 05/21/2017   Tetraplegia ([place]) 05/21/2017   Fracture    Trauma    Polysubstance abuse (H[facility])    Fever    Tachycardia    Post-operative pain    Leukocytosis    SIRS (systemic inflammatory response syndrome) ([place])  Acute blood loss anemia    TBI (traumatic brain injury) 05/03/2017   Polysubstance abuse ([place]) 01/12/2017    Conditions to be addressed/monitored:  Traumatic Brain Injury, Chronic Pain Syndrome, Neurogenic Bladder, Moderate Episode of Recurrent Major Depressive Disorder ; ADL IADL limitations and Limited access to caregiver  Care Plan : Social Work [facility] Care Plan  Updates made by Daneen Schick since 07/20/2021 12:00 AM     Problem: Barriers to  Treatment      Long-Range Goal: Barriers to Treatment Identified and Managed   Start Date: 01/18/2021  This Visit's Progress: On track  Recent Progress: On track  Priority: High  Note:   Current Barriers:  Chronic disease management support and education needs related to  Traumatic Brain Injury, Chronic Pain Syndrome, Neurogenic Bladder, and Moderate Episode of Recurrent Major Depressive Disorder   Transportation Limited access to a caregiver Social Worker Clinical Goal(s):  patient will work with SW to identify and address any acute and/or chronic care coordination needs related to the self health management of  Traumatic Brain Injury, Chronic Pain Syndrome, Neurogenic Bladder, and Moderate Episode of Recurrent Major Depressive Disorder   patient and his spouse will work with SW to identify resources to assist with transportation patient and his spouse will identify caregiver resource options SW Interventions:  Inter-disciplinary care team collaboration (see longitudinal plan of care) Collaboration with Vincent Brine, FNP regarding development and update of comprehensive plan of care as evidenced by provider attestation and co-signature Successful outbound call placed to the patients spouse [name] to assess care coordination needs Discussed the patient has been discharged from home health but is doing well in the home - needs assistance with bathing but Mrs. Barkdull has healed and is able to assist Determined patient has yet to receive electric wheelchair from [employer] but has completed in home assessment and is awaiting insurance approval Noted patient remains on the wait list for a caregiver under CAP/DA program SDoH assessment performed - no acute challenges Discussed long term follow up with SW while patient remains engaged with RN Care Manager to address care management needs  Patient Goals/Self-Care Activities patient will: With the help of his spouse  -  Contact  SW as needed prior to next scheduled call -Engage with [name] from [employer] to obtain electric wheelchair  Follow Up Plan:  SW will follow up with the patient over the next 90 days       Follow Up Plan: SW will follow up with patient by phone over the next 90 days      Daneen Schick, BSW, CDP Social Worker, Certified Dementia Practitioner [place] [facility] [place] [facility] [phone]

## 2021-07-20 NOTE — Patient Instructions (Signed)
Social Worker Visit Information  Goals we discussed today:   Goals Addressed             This Visit's Progress    Barriers to treatment identified and managed   On track    Timeframe:  Long-Range Goal Priority:  High Start Date:   5.11.22                                   Next planned outreach: 2.10.23            Patient Goals/Self-Care Activities patient will: With the help of his spouse  - Contact SW as needed prior to next scheduled call -Engage with [name] from [employer] to obtain electric wheelchair          Materials Provided: Yes: Provided AVS via MyChart   Patient verbalizes understanding of instructions provided today and agrees to view in [place].   Follow Up Plan: SW will follow up with patient by phone over the next 90 days  Daneen Schick, BSW, CDP Social Worker, Certified Dementia Practitioner [place] [facility] [place] [facility] [phone]

## 2021-07-25 DIAGNOSIS — M5442 Lumbago with sciatica, left side: Secondary | ICD-10-CM | POA: Diagnosis not present

## 2021-07-25 DIAGNOSIS — M542 Cervicalgia: Secondary | ICD-10-CM | POA: Diagnosis not present

## 2021-07-25 DIAGNOSIS — L89152 Pressure ulcer of sacral region, stage 2: Secondary | ICD-10-CM | POA: Diagnosis not present

## 2021-07-25 DIAGNOSIS — G894 Chronic pain syndrome: Secondary | ICD-10-CM | POA: Diagnosis not present

## 2021-08-01 DIAGNOSIS — F32A Depression, unspecified: Secondary | ICD-10-CM | POA: Diagnosis not present

## 2021-08-01 DIAGNOSIS — F17210 Nicotine dependence, cigarettes, uncomplicated: Secondary | ICD-10-CM | POA: Diagnosis not present

## 2021-08-01 DIAGNOSIS — M8580 Other specified disorders of bone density and structure, unspecified site: Secondary | ICD-10-CM | POA: Diagnosis not present

## 2021-08-01 DIAGNOSIS — M542 Cervicalgia: Secondary | ICD-10-CM | POA: Diagnosis not present

## 2021-08-01 DIAGNOSIS — Z87891 Personal history of nicotine dependence: Secondary | ICD-10-CM | POA: Diagnosis not present

## 2021-08-01 DIAGNOSIS — Z1211 Encounter for screening for malignant neoplasm of colon: Secondary | ICD-10-CM | POA: Diagnosis not present

## 2021-08-01 DIAGNOSIS — Z6841 Body Mass Index (BMI) 40.0 and over, adult: Secondary | ICD-10-CM | POA: Diagnosis not present

## 2021-08-01 DIAGNOSIS — Z79899 Other long term (current) drug therapy: Secondary | ICD-10-CM | POA: Diagnosis not present

## 2021-08-01 DIAGNOSIS — R1084 Generalized abdominal pain: Secondary | ICD-10-CM | POA: Diagnosis not present

## 2021-08-04 DIAGNOSIS — Z79899 Other long term (current) drug therapy: Secondary | ICD-10-CM | POA: Diagnosis not present

## 2021-08-09 DIAGNOSIS — F331 Major depressive disorder, recurrent, moderate: Secondary | ICD-10-CM

## 2021-08-11 ENCOUNTER — Telehealth: Payer: Self-pay

## 2021-08-11 ENCOUNTER — Telehealth: Payer: Medicare HMO

## 2021-08-11 NOTE — Telephone Encounter (Signed)
  Care Management   Follow Up Note   08/11/2021 Name: Vincent Umar Sr. MRN: [id number] DOB: 04-14-1966   Referred by: [name], FNP Reason for referral : Chronic Care Management (RN CM Follow up call )   An unsuccessful telephone outreach was attempted today. The patient was referred to the case management team for assistance with care management and care coordination.   Follow Up Plan: A HIPPA compliant phone message was left for the patient providing contact information and requesting a return call.    Barb Merino, RN, BSN, CCM Care Management Coordinator [place] [facility]  Direct Phone: [phone]

## 2021-08-25 DIAGNOSIS — R030 Elevated blood-pressure reading, without diagnosis of hypertension: Secondary | ICD-10-CM | POA: Diagnosis not present

## 2021-08-25 DIAGNOSIS — F32A Depression, unspecified: Secondary | ICD-10-CM | POA: Diagnosis not present

## 2021-08-25 DIAGNOSIS — Z79899 Other long term (current) drug therapy: Secondary | ICD-10-CM | POA: Diagnosis not present

## 2021-08-25 DIAGNOSIS — M542 Cervicalgia: Secondary | ICD-10-CM | POA: Diagnosis not present

## 2021-08-25 DIAGNOSIS — Z87891 Personal history of nicotine dependence: Secondary | ICD-10-CM | POA: Diagnosis not present

## 2021-08-25 DIAGNOSIS — F17210 Nicotine dependence, cigarettes, uncomplicated: Secondary | ICD-10-CM | POA: Diagnosis not present

## 2021-08-25 DIAGNOSIS — Z6841 Body Mass Index (BMI) 40.0 and over, adult: Secondary | ICD-10-CM | POA: Diagnosis not present

## 2021-08-28 DIAGNOSIS — Z79899 Other long term (current) drug therapy: Secondary | ICD-10-CM | POA: Diagnosis not present

## 2021-08-29 DIAGNOSIS — Z1211 Encounter for screening for malignant neoplasm of colon: Secondary | ICD-10-CM | POA: Diagnosis not present

## 2021-08-29 DIAGNOSIS — Z1212 Encounter for screening for malignant neoplasm of rectum: Secondary | ICD-10-CM | POA: Diagnosis not present

## 2021-09-07 DIAGNOSIS — E669 Obesity, unspecified: Secondary | ICD-10-CM | POA: Diagnosis not present

## 2021-09-07 DIAGNOSIS — G479 Sleep disorder, unspecified: Secondary | ICD-10-CM | POA: Diagnosis not present

## 2021-09-07 DIAGNOSIS — R942 Abnormal results of pulmonary function studies: Secondary | ICD-10-CM | POA: Diagnosis not present

## 2021-09-18 ENCOUNTER — Ambulatory Visit (INDEPENDENT_AMBULATORY_CARE_PROVIDER_SITE_OTHER): Payer: Medicare HMO

## 2021-09-18 ENCOUNTER — Other Ambulatory Visit: Payer: Self-pay | Admitting: Nurse Practitioner

## 2021-09-18 ENCOUNTER — Telehealth: Payer: Medicare HMO

## 2021-09-18 DIAGNOSIS — F331 Major depressive disorder, recurrent, moderate: Secondary | ICD-10-CM

## 2021-09-18 DIAGNOSIS — G8929 Other chronic pain: Secondary | ICD-10-CM

## 2021-09-18 DIAGNOSIS — R300 Dysuria: Secondary | ICD-10-CM

## 2021-09-18 DIAGNOSIS — Z8659 Personal history of other mental and behavioral disorders: Secondary | ICD-10-CM

## 2021-09-18 DIAGNOSIS — S069X3S Unspecified intracranial injury 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, sequela: Secondary | ICD-10-CM

## 2021-09-18 DIAGNOSIS — M542 Cervicalgia: Secondary | ICD-10-CM

## 2021-09-18 DIAGNOSIS — N319 Neuromuscular dysfunction of bladder, unspecified: Secondary | ICD-10-CM

## 2021-09-18 DIAGNOSIS — G894 Chronic pain syndrome: Secondary | ICD-10-CM

## 2021-09-18 NOTE — Patient Instructions (Signed)
Visit Information  Thank you for taking time to visit with me today. Please don't hesitate to contact me if I can be of assistance to you before our next scheduled telephone appointment.  Following are the goals we discussed today:  (Copy and paste patient goals from clinical care plan here)  Our next appointment is by telephone on 10/17/21 at 12:45 PM   Please call the care guide team at [phone] if you need to cancel or reschedule your appointment.   If you are experiencing a Mental Health or [place] or need someone to talk to, please call 1-800-273-TALK (toll free, 24 hour hotline)   Patient verbalizes understanding of instructions provided today and agrees to view in [place].   Barb Merino, RN, BSN, CCM Care Management Coordinator [place] [facility]  Direct Phone: [phone]

## 2021-09-18 NOTE — Chronic Care Management (AMB) (Signed)
Chronic Care Management   CCM RN Visit Note  09/18/2021 Name: Vincent Vantrease Sr. MRN: [id number] DOB: 1965/12/22  Subjective: Vincent Mems Sr. is a 56 y.o. year old male who is a primary care patient of [name], [facility]. The care management team was consulted for assistance with disease management and care coordination needs.    Engaged with patient by telephone for follow up visit in response to provider referral for case management and/or care coordination services.   Consent to Services:  The patient was given information about Chronic Care Management services, agreed to services, and gave verbal consent prior to initiation of services.  Please see initial visit note for detailed documentation.   Patient agreed to services and verbal consent obtained.   Assessment: Review of patient past medical history, allergies, medications, health status, including review of consultants reports, laboratory and other test data, was performed as part of comprehensive evaluation and provision of chronic care management services.   SDOH (Social Determinants of Health) assessments and interventions performed:  Yes, no acute needs  CCM Care Plan  No Known Allergies  Outpatient Encounter Medications as of 09/18/2021  Medication Sig   diclofenac sodium (VOLTAREN) 1 % GEL Apply 2 g 4 (four) times daily topically.   docusate sodium (COLACE) 50 MG capsule Take 50 mg by mouth 2 (two) times daily. (Patient not taking: Reported on 06/27/2021)   furosemide (LASIX) 20 MG tablet TAKE 1 TABLET BY MOUTH EVERY DAY (Patient taking differently: daily as needed.)   gabapentin (NEURONTIN) 600 MG tablet TAKE 1 TABLET THREE TIMES DAILY (Patient taking differently: 2 (two) times daily.)   GNP MAGNESIUM OXIDE 250 MG TABS TAKE 1 TABLET (250 MG TOTAL) BY MOUTH EVERY EVENING.   latanoprost (XALATAN) 0.005 % ophthalmic solution Place 1 drop into both eyes nightly.   lidocaine (LIDODERM) 5 % Place 1 patch onto  the skin daily. Remove & Discard patch within 12 hours or as directed by MD   meloxicam (MOBIC) 7.5 MG tablet Take 1 tablet (7.5 mg total) by mouth daily.   methocarbamol (ROBAXIN) 500 MG tablet TAKE 1 TABLET EVERY 8 HOURS AS NEEDED FOR MUSCLE SPASM(S)   naphazoline-glycerin (CLEAR EYES) 0.012-0.2 % SOLN Place 2 drops 4 (four) times daily -  with meals and at bedtime into both eyes.   oxycodone-acetaminophen (PERCOCET) 2.5-325 MG tablet Take 1 tablet by mouth every 4 (four) hours as needed for pain. 5-3.25 per pt wife   sertraline (ZOLOFT) 50 MG tablet Take 75 mg by mouth daily.   tamsulosin (FLOMAX) 0.4 MG CAPS capsule TAKE 1 CAPSULE EVERY DAY   timolol (TIMOPTIC) 0.5 % ophthalmic solution    No facility-administered encounter medications on file as of 09/18/2021.    Patient Active Problem List   Diagnosis Date Noted   Pseudomonas urinary tract infection    Neurogenic bladder    Neck pain    Chronic pain syndrome    Transaminitis    Diffuse TBI w loss of consciousness of unsp duration, init ([place]) 05/21/2017   Tetraplegia ([place]) 05/21/2017   Fracture    Trauma    Polysubstance abuse (HCC)    Fever    Tachycardia    Post-operative pain    Leukocytosis    SIRS (systemic inflammatory response syndrome) ([place])    Acute blood loss anemia    TBI (traumatic brain injury) 05/03/2017   Polysubstance abuse ([place]) 01/12/2017    Conditions to be addressed/monitored:Traumatic brain injury, with loss of consciousness of 1  hour to 5 hours 59 minutes, Chronic Pain Syndrome, Neurogenic bladder, Dysuria, Chronic neck pain, Moderate episode of recurrent major depressive disorder   Care Plan : RN Care Manager Plan of Care  Updates made by Lynne Logan, RN since 09/18/2021 12:00 AM     Problem: No plan of care established for management of chronic disease states (Traumatic brain injury,Chronic Pain Syndrome,Neurogenic bladder,Dysuria,Chronic neck pain, Moderate episode of recurrent major depressive  disorder)   Priority: High     Long-Range Goal: Establishment of plan of care for management of chronic disease states ((Traumatic brain injury,Chronic Pain Syndrome,Neurogenic bladder,Dysuria,Chronic neck pain, Moderate episode of recurrent major depressive disorder)   Start Date: 09/18/2021  Expected End Date: 09/18/2022  This Visit's Progress: On track  Priority: High  Note:   Current Barriers:  Knowledge Deficits related to plan of care for management of (Traumatic brain injury,Chronic Pain Syndrome,Neurogenic bladder,Dysuria,Chronic neck pain, Moderate episode of recurrent major depressive disorder)  Chronic Disease Management support and education needs related to (Traumatic brain injury,Chronic Pain Syndrome,Neurogenic bladder,Dysuria,Chronic neck pain, Moderate episode of recurrent major depressive disorder)   RNCM Clinical Goal(s):  Patient will verbalize basic understanding of  Traumatic brain injury,Chronic Pain Syndrome,Neurogenic bladder,Dysuria,Chronic neck pain, Moderate episode of recurrent major depressive disorder disease process and self health management plan as evidenced by patient/wife will report having no disease exacerbation  take all medications exactly as prescribed and will call provider for medication related questions as evidenced by patient/wife will report having no missed doses of patient's prescribed medications  demonstrate Improved health management independence as evidenced by patient/wife will report 100% adherence to his prescribed treatment plan  continue to work with RN Care Manager to address care management and care coordination needs related to  Traumatic brain injury,Chronic Pain Syndrome,Neurogenic bladder,Dysuria,Chronic neck pain, Moderate episode of recurrent major depressive disorder as evidenced by adherence to CM Team Scheduled appointments through collaboration with RN Care manager, provider, and care team.   Interventions: 1:1 collaboration with  primary care provider regarding development and update of comprehensive plan of care as evidenced by provider attestation and co-signature Inter-disciplinary care team collaboration (see longitudinal plan of care) Evaluation of current treatment plan related to  self management and patient's adherence to plan as established by provider Completed successful outbound call with wife [name]   Depression Interventions:  (Status:  New goal.)  Long Term Goal Evaluation of current treatment plan related to Depression, self-management and patient's adherence to plan as established by provider Determined patient was not contacted by the [facility] following PCP referral sent in August Placed outbound call to [facility] to inquire about the Psychiatric referral for counseling  Determined [facility] did not receive the referral, however the [facility] will not be able to provide services to patient at this time Discussed alternative providers including, [facility] outbound call to obtain referral information and confirmed each of the providers listed above are in network with [employer] with PCP [name] FNP to advise of referral status and requested the referral be resent to one of the following providers  Discussed plans with patient for ongoing care management follow up and provided patient with direct contact information for care management team  Impaired Physical Mobility Interventions:  (Status:  Goal on track:  Yes.)  Long Term Goal Evaluation of current treatment plan related to  Impaired Physical Mobility , self-management and patient's adherence to plan as established  by provider Determined patient received his power WC and this has improved his quality of life and mobility within his home per his wife  Assessed for ongoing DME needs, wife denies  Assessed for falls, none reported  Encouraged wife to keep  this RN CM and or PCP well informed of ongoing mobility concerns and or DME needs  Discussed plans with patient for ongoing care management follow up and provided patient with direct contact information for care management team    Patient Goals/Self-Care Activities: Take all medications as prescribed Attend all scheduled provider appointments Call pharmacy for medication refills 3-7 days in advance of running out of medications Call provider office for new concerns or questions   Follow Up Plan:  Telephone follow up appointment with care management team member scheduled for:  10/17/21      Plan:Telephone follow up appointment with care management team member scheduled for:  10/17/21  Barb Merino, RN, BSN, CCM Care Management Coordinator [facility]  Direct Phone: [phone]

## 2021-09-25 DIAGNOSIS — Z79899 Other long term (current) drug therapy: Secondary | ICD-10-CM | POA: Diagnosis not present

## 2021-09-25 DIAGNOSIS — F32A Depression, unspecified: Secondary | ICD-10-CM | POA: Diagnosis not present

## 2021-09-25 DIAGNOSIS — Z114 Encounter for screening for human immunodeficiency virus [HIV]: Secondary | ICD-10-CM | POA: Diagnosis not present

## 2021-09-25 DIAGNOSIS — E7800 Pure hypercholesterolemia, unspecified: Secondary | ICD-10-CM | POA: Diagnosis not present

## 2021-09-25 DIAGNOSIS — F17210 Nicotine dependence, cigarettes, uncomplicated: Secondary | ICD-10-CM | POA: Diagnosis not present

## 2021-09-25 DIAGNOSIS — R0602 Shortness of breath: Secondary | ICD-10-CM | POA: Diagnosis not present

## 2021-09-25 DIAGNOSIS — N400 Enlarged prostate without lower urinary tract symptoms: Secondary | ICD-10-CM | POA: Diagnosis not present

## 2021-09-25 DIAGNOSIS — Z0000 Encounter for general adult medical examination without abnormal findings: Secondary | ICD-10-CM | POA: Diagnosis not present

## 2021-09-25 DIAGNOSIS — Z125 Encounter for screening for malignant neoplasm of prostate: Secondary | ICD-10-CM | POA: Diagnosis not present

## 2021-09-25 DIAGNOSIS — Z6841 Body Mass Index (BMI) 40.0 and over, adult: Secondary | ICD-10-CM | POA: Diagnosis not present

## 2021-09-25 DIAGNOSIS — E559 Vitamin D deficiency, unspecified: Secondary | ICD-10-CM | POA: Diagnosis not present

## 2021-09-25 DIAGNOSIS — R5383 Other fatigue: Secondary | ICD-10-CM | POA: Diagnosis not present

## 2021-09-25 DIAGNOSIS — Z113 Encounter for screening for infections with a predominantly sexual mode of transmission: Secondary | ICD-10-CM | POA: Diagnosis not present

## 2021-09-25 DIAGNOSIS — M129 Arthropathy, unspecified: Secondary | ICD-10-CM | POA: Diagnosis not present

## 2021-09-25 DIAGNOSIS — M542 Cervicalgia: Secondary | ICD-10-CM | POA: Diagnosis not present

## 2021-09-27 DIAGNOSIS — Z79899 Other long term (current) drug therapy: Secondary | ICD-10-CM | POA: Diagnosis not present

## 2021-10-04 DIAGNOSIS — F332 Major depressive disorder, recurrent severe without psychotic features: Secondary | ICD-10-CM | POA: Diagnosis not present

## 2021-10-04 DIAGNOSIS — F1911 Other psychoactive substance abuse, in remission: Secondary | ICD-10-CM | POA: Diagnosis not present

## 2021-10-05 DIAGNOSIS — R8271 Bacteriuria: Secondary | ICD-10-CM | POA: Diagnosis not present

## 2021-10-05 DIAGNOSIS — N3000 Acute cystitis without hematuria: Secondary | ICD-10-CM | POA: Diagnosis not present

## 2021-10-10 DIAGNOSIS — F331 Major depressive disorder, recurrent, moderate: Secondary | ICD-10-CM

## 2021-10-17 ENCOUNTER — Telehealth: Payer: Medicare HMO

## 2021-10-17 ENCOUNTER — Ambulatory Visit (INDEPENDENT_AMBULATORY_CARE_PROVIDER_SITE_OTHER): Payer: Medicare HMO

## 2021-10-17 DIAGNOSIS — N319 Neuromuscular dysfunction of bladder, unspecified: Secondary | ICD-10-CM

## 2021-10-17 DIAGNOSIS — G894 Chronic pain syndrome: Secondary | ICD-10-CM

## 2021-10-17 DIAGNOSIS — G8929 Other chronic pain: Secondary | ICD-10-CM

## 2021-10-17 DIAGNOSIS — F331 Major depressive disorder, recurrent, moderate: Secondary | ICD-10-CM

## 2021-10-17 DIAGNOSIS — M542 Cervicalgia: Secondary | ICD-10-CM

## 2021-10-17 DIAGNOSIS — R300 Dysuria: Secondary | ICD-10-CM

## 2021-10-17 DIAGNOSIS — S069X3S Unspecified intracranial injury 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, sequela: Secondary | ICD-10-CM

## 2021-10-17 NOTE — Chronic Care Management (AMB) (Signed)
Chronic Care Management   CCM RN Visit Note  10/17/2021 Name: Vincent Eguia Sr. MRN: [id number] DOB: March 20, 1966  Subjective: Vincent Mems Sr. is a 56 y.o. year old male who is a primary care patient of [name], [place]. The care management team was consulted for assistance with disease management and care coordination needs.    Engaged with patient by telephone for follow up visit in response to provider referral for case management and/or care coordination services.   Consent to Services:  The patient was given information about Chronic Care Management services, agreed to services, and gave verbal consent prior to initiation of services.  Please see initial visit note for detailed documentation.   Patient agreed to services and verbal consent obtained.   Assessment: Review of patient past medical history, allergies, medications, health status, including review of consultants reports, laboratory and other test data, was performed as part of comprehensive evaluation and provision of chronic care management services.   SDOH (Social Determinants of Health) assessments and interventions performed:  Yes, no acute needs   CCM Care Plan  No Known Allergies  Outpatient Encounter Medications as of 10/17/2021  Medication Sig   diclofenac sodium (VOLTAREN) 1 % GEL Apply 2 g 4 (four) times daily topically.   docusate sodium (COLACE) 50 MG capsule Take 50 mg by mouth 2 (two) times daily. (Patient not taking: Reported on 06/27/2021)   furosemide (LASIX) 20 MG tablet TAKE 1 TABLET BY MOUTH EVERY DAY (Patient taking differently: daily as needed.)   gabapentin (NEURONTIN) 600 MG tablet TAKE 1 TABLET THREE TIMES DAILY (Patient taking differently: 2 (two) times daily.)   GNP MAGNESIUM OXIDE 250 MG TABS TAKE 1 TABLET (250 MG TOTAL) BY MOUTH EVERY EVENING.   latanoprost (XALATAN) 0.005 % ophthalmic solution Place 1 drop into both eyes nightly.   lidocaine (LIDODERM) 5 % Place 1 patch onto  the skin daily. Remove & Discard patch within 12 hours or as directed by MD   meloxicam (MOBIC) 7.5 MG tablet Take 1 tablet (7.5 mg total) by mouth daily.   methocarbamol (ROBAXIN) 500 MG tablet TAKE 1 TABLET EVERY 8 HOURS AS NEEDED FOR MUSCLE SPASM(S)   naphazoline-glycerin (CLEAR EYES) 0.012-0.2 % SOLN Place 2 drops 4 (four) times daily -  with meals and at bedtime into both eyes.   oxycodone-acetaminophen (PERCOCET) 2.5-325 MG tablet Take 1 tablet by mouth every 4 (four) hours as needed for pain. 5-3.25 per pt wife   sertraline (ZOLOFT) 50 MG tablet Take 75 mg by mouth daily.   tamsulosin (FLOMAX) 0.4 MG CAPS capsule TAKE 1 CAPSULE EVERY DAY   timolol (TIMOPTIC) 0.5 % ophthalmic solution    No facility-administered encounter medications on file as of 10/17/2021.    Patient Active Problem List   Diagnosis Date Noted   Pseudomonas urinary tract infection    Neurogenic bladder    Neck pain    Chronic pain syndrome    Transaminitis    Diffuse TBI w loss of consciousness of unsp duration, init ([place]) 05/21/2017   Tetraplegia ([place]) 05/21/2017   Fracture    Trauma    Polysubstance abuse (HCC)    Fever    Tachycardia    Post-operative pain    Leukocytosis    SIRS (systemic inflammatory response syndrome) ([place])    Acute blood loss anemia    TBI (traumatic brain injury) 05/03/2017   Polysubstance abuse ([place]) 01/12/2017    Conditions to be addressed/monitored: Traumatic brain injury, with loss of consciousness  of 1 hour to 5 hours 59 minutes, Chronic Pain Syndrome, Neurogenic bladder, Dysuria, Chronic neck pain, Moderate episode of recurrent major depressive disorder   Care Plan : RN Care Manager Plan of Care  Updates made by Vincent Logan, RN since 10/17/2021 12:00 AM     Problem: No plan of care established for management of chronic disease states (Traumatic brain injury,Chronic Pain Syndrome,Neurogenic bladder,Dysuria,Chronic neck pain, Moderate episode of recurrent major depressive  disorder)   Priority: High     Long-Range Goal: Establishment of plan of care for management of chronic disease states ((Traumatic brain injury,Chronic Pain Syndrome,Neurogenic bladder,Dysuria,Chronic neck pain, Moderate episode of recurrent major depressive disorder)   Start Date: 09/18/2021  Expected End Date: 09/18/2022  Recent Progress: On track  Priority: High  Note:   Current Barriers:  Knowledge Deficits related to plan of care for management of (Traumatic brain injury,Chronic Pain Syndrome,Neurogenic bladder,Dysuria,Chronic neck pain, Moderate episode of recurrent major depressive disorder)  Chronic Disease Management support and education needs related to (Traumatic brain injury,Chronic Pain Syndrome,Neurogenic bladder,Dysuria,Chronic neck pain, Moderate episode of recurrent major depressive disorder)   RNCM Clinical Goal(s):  Patient will verbalize basic understanding of  Traumatic brain injury,Chronic Pain Syndrome,Neurogenic bladder,Dysuria,Chronic neck pain, Moderate episode of recurrent major depressive disorder disease process and self health management plan as evidenced by patient/wife will report having no disease exacerbation  take all medications exactly as prescribed and will call provider for medication related questions as evidenced by patient/wife will report having no missed doses of patient's prescribed medications  demonstrate Improved health management independence as evidenced by patient/wife will report 100% adherence to his prescribed treatment plan  continue to work with RN Care Manager to address care management and care coordination needs related to  Traumatic brain injury,Chronic Pain Syndrome,Neurogenic bladder,Dysuria,Chronic neck pain, Moderate episode of recurrent major depressive disorder as evidenced by adherence to CM Team Scheduled appointments through collaboration with RN Care manager, provider, and care team.   Interventions: 1:1 collaboration with  primary care provider regarding development and update of comprehensive plan of care as evidenced by provider attestation and co-signature Inter-disciplinary care team collaboration (see longitudinal plan of care) Evaluation of current treatment plan related to  self management and patient's adherence to plan as established by provider Completed successful outbound call with wife [name]   Depression Interventions:  (Status:  Goal on track:  Yes.)  Long Term Goal Completed successful outbound call with wife and caregiver Taye Cato  Evaluation of current treatment plan related to Depression, self-management and patient's adherence to plan as established by provider Discussed PCP referral to [facility] and counseling Determined patient received his initial visit with Dr. Altamese Camanche and feels satisfied with this provider for ongoing treatment, wife denies having questions or concerns at this time Discussed plans with patient for ongoing care management follow up and provided patient with direct contact information for care management team  Impaired Physical Mobility Interventions:  (Status:  Condition stable.  Not addressed this visit.)  Long Term Goal Evaluation of current treatment plan related to  Impaired Physical Mobility , self-management and patient's adherence to plan as established by provider Determined patient received his power WC and this has improved his quality of life and mobility within his home per his wife  Assessed for ongoing DME needs, wife denies  Assessed for falls, none reported  Encouraged wife to keep this RN CM and or PCP well informed of ongoing mobility concerns and or DME needs  Discussed plans with patient for ongoing care management follow up and provided patient with direct contact information for care management team   Patient Goals/Self-Care Activities: Take all medications as prescribed Attend all scheduled provider appointments Call  pharmacy for medication refills 3-7 days in advance of running out of medications Call provider office for new concerns or questions   Follow Up Plan:  Telephone follow up appointment with care management team member scheduled for:  01/03/22      Plan:Telephone follow up appointment with care management team member scheduled for:  01/03/22  Barb Merino, RN, BSN, CCM Care Management Coordinator [facility]  Direct Phone: [phone]

## 2021-10-17 NOTE — Patient Instructions (Signed)
Visit Information  Thank you for taking time to visit with me today. Please don't hesitate to contact me if I can be of assistance to you before our next scheduled telephone appointment.  Following are the goals we discussed today:  (Copy and paste patient goals from clinical care plan here)  Our next appointment is by telephone on 01/03/22 at 11:15 AM   Please call the care guide team at [phone] if you need to cancel or reschedule your appointment.   If you are experiencing a Mental Health or [place] or need someone to talk to, please call 1-800-273-TALK (toll free, 24 hour hotline)   Patient verbalizes understanding of instructions and care plan provided today and agrees to view in [facility]. Active MyChart status confirmed with patient.    Barb Merino, RN, BSN, CCM Care Management Coordinator [facility]  Direct Phone: [phone]

## 2021-10-20 ENCOUNTER — Ambulatory Visit: Payer: Medicare HMO

## 2021-10-20 DIAGNOSIS — G894 Chronic pain syndrome: Secondary | ICD-10-CM

## 2021-10-20 DIAGNOSIS — S069X3S Unspecified intracranial injury 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, sequela: Secondary | ICD-10-CM

## 2021-10-20 DIAGNOSIS — F331 Major depressive disorder, recurrent, moderate: Secondary | ICD-10-CM

## 2021-10-20 DIAGNOSIS — N319 Neuromuscular dysfunction of bladder, unspecified: Secondary | ICD-10-CM

## 2021-10-20 NOTE — Chronic Care Management (AMB) (Signed)
Chronic Care Management    Social Work Note  10/20/2021 Name: Vincent Alexopoulos Sr. MRN: [id number] DOB: [birth year]  Vincent Mems Sr. is a 56 y.o. year old male who is a primary care patient of [name], [place]. The CCM team was consulted to assist the patient with chronic disease management and/or care coordination needs related to:  Traumatic Brain Injury, Chronic Pain Syndrome, Neurogenic Bladder, Moderate Episode of Recurrent Major Depressive Disorder .   Engaged with patients wife and caregiver [name]  for follow up visit in response to provider referral for social work chronic care management and care coordination services.   Consent to Services:  The patient was given information about Chronic Care Management services, agreed to services, and gave verbal consent prior to initiation of services.  Please see initial visit note for detailed documentation.   Patient agreed to services and consent obtained.   Assessment: Review of patient past medical history, allergies, medications, and health status, including review of relevant consultants reports was performed today as part of a comprehensive evaluation and provision of chronic care management and care coordination services.     SDOH (Social Determinants of Health) assessments and interventions performed:  SDOH Interventions    Flowsheet Row Most Recent Value  SDOH Interventions   Food Insecurity Interventions Intervention Not Indicated  Housing Interventions Intervention Not Indicated  Transportation Interventions Intervention Not Indicated        Advanced Directives Status: Not addressed in this encounter.  CCM Care Plan  No Known Allergies  Outpatient Encounter Medications as of 10/20/2021  Medication Sig   diclofenac sodium (VOLTAREN) 1 % GEL Apply 2 g 4 (four) times daily topically.   docusate sodium (COLACE) 50 MG capsule Take 50 mg by mouth 2 (two) times daily. (Patient not taking: Reported on  06/27/2021)   furosemide (LASIX) 20 MG tablet TAKE 1 TABLET BY MOUTH EVERY DAY (Patient taking differently: daily as needed.)   gabapentin (NEURONTIN) 600 MG tablet TAKE 1 TABLET THREE TIMES DAILY (Patient taking differently: 2 (two) times daily.)   GNP MAGNESIUM OXIDE 250 MG TABS TAKE 1 TABLET (250 MG TOTAL) BY MOUTH EVERY EVENING.   latanoprost (XALATAN) 0.005 % ophthalmic solution Place 1 drop into both eyes nightly.   lidocaine (LIDODERM) 5 % Place 1 patch onto the skin daily. Remove & Discard patch within 12 hours or as directed by MD   meloxicam (MOBIC) 7.5 MG tablet Take 1 tablet (7.5 mg total) by mouth daily.   methocarbamol (ROBAXIN) 500 MG tablet TAKE 1 TABLET EVERY 8 HOURS AS NEEDED FOR MUSCLE SPASM(S)   naphazoline-glycerin (CLEAR EYES) 0.012-0.2 % SOLN Place 2 drops 4 (four) times daily -  with meals and at bedtime into both eyes.   oxycodone-acetaminophen (PERCOCET) 2.5-325 MG tablet Take 1 tablet by mouth every 4 (four) hours as needed for pain. 5-3.25 per pt wife   sertraline (ZOLOFT) 50 MG tablet Take 75 mg by mouth daily.   tamsulosin (FLOMAX) 0.4 MG CAPS capsule TAKE 1 CAPSULE EVERY DAY   timolol (TIMOPTIC) 0.5 % ophthalmic solution    No facility-administered encounter medications on file as of 10/20/2021.    Patient Active Problem List   Diagnosis Date Noted   Pseudomonas urinary tract infection    Neurogenic bladder    Neck pain    Chronic pain syndrome    Transaminitis    Diffuse TBI w loss of consciousness of unsp duration, init ([place]) 05/21/2017   Tetraplegia ([place]) 05/21/2017  Fracture    Trauma    Polysubstance abuse (HCC)    Fever    Tachycardia    Post-operative pain    Leukocytosis    SIRS (systemic inflammatory response syndrome) (HCC)    Acute blood loss anemia    TBI (traumatic brain injury) 05/03/2017   Polysubstance abuse ([place]) 01/12/2017    Conditions to be addressed/monitored: Traumatic Brain Injury, Chronic Pain Syndrome, Neurogenic Bladder,  Moderate Episode of Recurrent Major Depressive Disorder; Limited access to caregiver  Care Plan : Social Work [facility] Care Plan  Updates made by Daneen Schick since 10/20/2021 12:00 AM  Completed 10/20/2021   Problem: Barriers to Treatment Resolved 10/20/2021     Long-Range Goal: Barriers to Treatment Identified and Managed Completed 10/20/2021  Start Date: 01/18/2021  This Visit's Progress: On track  Recent Progress: On track  Priority: High  Note:   Current Barriers:  Chronic disease management support and education needs related to  Traumatic Brain Injury, Chronic Pain Syndrome, Neurogenic Bladder, and Moderate Episode of Recurrent Major Depressive Disorder   Transportation Limited access to a caregiver Social Worker Clinical Goal(s):  patient will work with SW to identify and address any acute and/or chronic care coordination needs related to the self health management of  Traumatic Brain Injury, Chronic Pain Syndrome, Neurogenic Bladder, and Moderate Episode of Recurrent Major Depressive Disorder   patient and his spouse will work with SW to identify resources to assist with transportation patient and his spouse will identify caregiver resource options SW Interventions:  Inter-disciplinary care team collaboration (see longitudinal plan of care) Collaboration with [name], FNP regarding development and update of comprehensive plan of care as evidenced by provider attestation and co-signature Telephonic visit completed with the patients wife and caregiver [name] to assess goal progression Confirmed patient did receive his electric wheelchair which has improved his quality of life and ability to independently move throughout the home Assessed for progress of CAP/DA caregiver; Mrs. Juncaj indicates she received a letter indicating the patient is 10th on the waiting list Discussed plan for patient to remain engaged with RN Care Manager to assist with care management needs and  contact SW as needed   Patient Goals/Self-Care Activities patient will: With the help of his spouse  -  Contact SW as needed  -Engage with CAP/DA caregiver once services are offered         Follow Up Plan:  No SW follow up planned at this time. The patient will remain engaged with RN Care Manager to address care management needs.      Daneen Schick, BSW, CDP Social Worker, Certified Dementia Practitioner [place] [facility] [place] [facility] [phone]

## 2021-10-20 NOTE — Patient Instructions (Signed)
Social Worker Visit Information  Goals we discussed today:   Goals Addressed             This Visit's Progress    COMPLETED: Barriers to treatment identified and managed       Timeframe:  Long-Range Goal Priority:  High Start Date:   5.11.22                                   Patient Goals/Self-Care Activities patient will: With the help of his spouse  - Contact SW as needed  -Engage with CAP/DA caregiver once services are offered           Patient verbalizes understanding of instructions and care plan provided today and agrees to view in [employer]. Active MyChart status confirmed with patient.    Follow Up Plan:  No SW follow up planned at this time. Please contact me as needed.  Daneen Schick, BSW, CDP Social Worker, Certified Dementia Practitioner [place] [facility] [place] [facility] [phone]

## 2021-10-23 DIAGNOSIS — Z6841 Body Mass Index (BMI) 40.0 and over, adult: Secondary | ICD-10-CM | POA: Diagnosis not present

## 2021-10-23 DIAGNOSIS — Z79899 Other long term (current) drug therapy: Secondary | ICD-10-CM | POA: Diagnosis not present

## 2021-10-23 DIAGNOSIS — F32A Depression, unspecified: Secondary | ICD-10-CM | POA: Diagnosis not present

## 2021-10-23 DIAGNOSIS — F17210 Nicotine dependence, cigarettes, uncomplicated: Secondary | ICD-10-CM | POA: Diagnosis not present

## 2021-10-23 DIAGNOSIS — M542 Cervicalgia: Secondary | ICD-10-CM | POA: Diagnosis not present

## 2021-10-23 DIAGNOSIS — R0602 Shortness of breath: Secondary | ICD-10-CM | POA: Diagnosis not present

## 2021-10-25 DIAGNOSIS — Z79899 Other long term (current) drug therapy: Secondary | ICD-10-CM | POA: Diagnosis not present

## 2021-10-31 DIAGNOSIS — F1911 Other psychoactive substance abuse, in remission: Secondary | ICD-10-CM | POA: Diagnosis not present

## 2021-10-31 DIAGNOSIS — F332 Major depressive disorder, recurrent severe without psychotic features: Secondary | ICD-10-CM | POA: Diagnosis not present

## 2021-11-07 DIAGNOSIS — F331 Major depressive disorder, recurrent, moderate: Secondary | ICD-10-CM

## 2021-11-14 DIAGNOSIS — F332 Major depressive disorder, recurrent severe without psychotic features: Secondary | ICD-10-CM | POA: Diagnosis not present

## 2021-11-14 DIAGNOSIS — F1911 Other psychoactive substance abuse, in remission: Secondary | ICD-10-CM | POA: Diagnosis not present

## 2021-11-20 DIAGNOSIS — F17210 Nicotine dependence, cigarettes, uncomplicated: Secondary | ICD-10-CM | POA: Diagnosis not present

## 2021-11-20 DIAGNOSIS — N400 Enlarged prostate without lower urinary tract symptoms: Secondary | ICD-10-CM | POA: Diagnosis not present

## 2021-11-20 DIAGNOSIS — Z79899 Other long term (current) drug therapy: Secondary | ICD-10-CM | POA: Diagnosis not present

## 2021-11-20 DIAGNOSIS — Z6841 Body Mass Index (BMI) 40.0 and over, adult: Secondary | ICD-10-CM | POA: Diagnosis not present

## 2021-11-20 DIAGNOSIS — E7800 Pure hypercholesterolemia, unspecified: Secondary | ICD-10-CM | POA: Diagnosis not present

## 2021-11-20 DIAGNOSIS — M542 Cervicalgia: Secondary | ICD-10-CM | POA: Diagnosis not present

## 2021-11-20 DIAGNOSIS — F32A Depression, unspecified: Secondary | ICD-10-CM | POA: Diagnosis not present

## 2021-11-22 DIAGNOSIS — Z79899 Other long term (current) drug therapy: Secondary | ICD-10-CM | POA: Diagnosis not present

## 2021-11-23 ENCOUNTER — Telehealth (HOSPITAL_COMMUNITY): Payer: Self-pay | Admitting: Professional

## 2022-01-01 ENCOUNTER — Ambulatory Visit: Payer: Medicare HMO | Admitting: Nurse Practitioner

## 2022-01-03 ENCOUNTER — Telehealth: Payer: Medicare HMO

## 2022-01-03 ENCOUNTER — Ambulatory Visit (INDEPENDENT_AMBULATORY_CARE_PROVIDER_SITE_OTHER): Payer: Medicare HMO

## 2022-01-03 DIAGNOSIS — G8929 Other chronic pain: Secondary | ICD-10-CM

## 2022-01-03 DIAGNOSIS — N319 Neuromuscular dysfunction of bladder, unspecified: Secondary | ICD-10-CM

## 2022-01-03 DIAGNOSIS — G894 Chronic pain syndrome: Secondary | ICD-10-CM

## 2022-01-03 DIAGNOSIS — S069X3S Unspecified intracranial injury 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, sequela: Secondary | ICD-10-CM

## 2022-01-03 DIAGNOSIS — F331 Major depressive disorder, recurrent, moderate: Secondary | ICD-10-CM

## 2022-01-03 DIAGNOSIS — R300 Dysuria: Secondary | ICD-10-CM

## 2022-01-04 ENCOUNTER — Telehealth: Payer: Self-pay

## 2022-01-04 ENCOUNTER — Ambulatory Visit: Payer: Medicare HMO

## 2022-01-04 NOTE — Telephone Encounter (Signed)
This nurse attempted to call patient due to missed AWV appointment. Message states person is not able to accept calls at this time. Unable to leave a message. ?

## 2022-01-04 NOTE — Chronic Care Management (AMB) (Addendum)
?Chronic Care Management  ? ?CCM RN Visit Note ? ?01/03/2022 ?Name: Vincent Lill Sr. MRN: [id number] DOB: 11/29/1965 ? ?Subjective: ?Vincent Guettler Sr. is a 56 y.o. year old male who is a primary care patient of [name], [place]. The care management team was consulted for assistance with disease management and care coordination needs.   ? ?Engaged with patient by telephone for follow up visit in response to provider referral for case management and/or care coordination services.  ? ?Consent to Services:  ?The patient was given information about Chronic Care Management services, agreed to services, and gave verbal consent prior to initiation of services.  Please see initial visit note for detailed documentation.  ? ?Patient agreed to services and verbal consent obtained.  ? ?Assessment: Review of patient past medical history, allergies, medications, health status, including review of consultants reports, laboratory and other test data, was performed as part of comprehensive evaluation and provision of chronic care management services.  ? ?SDOH (Social Determinants of Health) assessments and interventions performed:  Yes, no acute needs  ? ?CCM Care Plan ? ?No Known Allergies ? ?Outpatient Encounter Medications as of 01/03/2022  ?Medication Sig  ? diclofenac sodium (VOLTAREN) 1 % GEL Apply 2 g 4 (four) times daily topically.  ? docusate sodium (COLACE) 50 MG capsule Take 50 mg by mouth 2 (two) times daily. (Patient not taking: Reported on 06/27/2021)  ? furosemide (LASIX) 20 MG tablet TAKE 1 TABLET BY MOUTH EVERY DAY (Patient taking differently: daily as needed.)  ? gabapentin (NEURONTIN) 600 MG tablet TAKE 1 TABLET THREE TIMES DAILY (Patient taking differently: 2 (two) times daily.)  ? GNP MAGNESIUM OXIDE 250 MG TABS TAKE 1 TABLET (250 MG TOTAL) BY MOUTH EVERY EVENING.  ? latanoprost (XALATAN) 0.005 % ophthalmic solution Place 1 drop into both eyes nightly.  ? lidocaine (LIDODERM) 5 % Place 1 patch  onto the skin daily. Remove & Discard patch within 12 hours or as directed by MD  ? meloxicam (MOBIC) 7.5 MG tablet Take 1 tablet (7.5 mg total) by mouth daily.  ? methocarbamol (ROBAXIN) 500 MG tablet TAKE 1 TABLET EVERY 8 HOURS AS NEEDED FOR MUSCLE SPASM(S)  ? naphazoline-glycerin (CLEAR EYES) 0.012-0.2 % SOLN Place 2 drops 4 (four) times daily -  with meals and at bedtime into both eyes.  ? oxycodone-acetaminophen (PERCOCET) 2.5-325 MG tablet Take 1 tablet by mouth every 4 (four) hours as needed for pain. 5-3.25 per pt wife  ? sertraline (ZOLOFT) 50 MG tablet Take 75 mg by mouth daily.  ? tamsulosin (FLOMAX) 0.4 MG CAPS capsule TAKE 1 CAPSULE EVERY DAY  ? timolol (TIMOPTIC) 0.5 % ophthalmic solution   ? ?No facility-administered encounter medications on file as of 01/03/2022.  ? ? ?Patient Active Problem List  ? Diagnosis Date Noted  ? Pseudomonas urinary tract infection   ? Neurogenic bladder   ? Neck pain   ? Chronic pain syndrome   ? Transaminitis   ? Diffuse TBI w loss of consciousness of unsp duration, init ([place]) 05/21/2017  ? Tetraplegia ([place]) 05/21/2017  ? Fracture   ? Trauma   ? Polysubstance abuse ([place])   ? Fever   ? Tachycardia   ? Post-operative pain   ? Leukocytosis   ? SIRS (systemic inflammatory response syndrome) (HCC)   ? Acute blood loss anemia   ? TBI (traumatic brain injury) ([place]) 05/03/2017  ? Polysubstance abuse ([place]) 01/12/2017  ? ? ?Conditions to be addressed/monitored: Traumatic brain injury, with loss of  consciousness of 1 hour to 5 hours 59 minutes, Chronic Pain Syndrome, Neurogenic bladder, Dysuria, Chronic neck pain, Moderate episode of recurrent major depressive disorder  ? ?Care Plan : RN Care Manager Plan of Care  ?Updates made by Lynne Logan, RN since 01/03/2022 12:00 AM  ?  ? ?Problem: No plan of care established for management of chronic disease states (Traumatic brain injury,Chronic Pain Syndrome,Neurogenic bladder,Dysuria,Chronic neck pain, Moderate episode of recurrent major  depressive disorder)   ?Priority: High  ?  ? ?Long-Range Goal: Establishment of plan of care for management of chronic disease states ((Traumatic brain injury,Chronic Pain Syndrome,Neurogenic bladder,Dysuria,Chronic neck pain, Moderate episode of recurrent major depressive disorder)   ?Start Date: 09/18/2021  ?Expected End Date: 09/18/2022  ?Recent Progress: On track  ?Priority: High  ?Note:   ?Current Barriers:  ?Knowledge Deficits related to plan of care for management of (Traumatic brain injury,Chronic Pain Syndrome,Neurogenic bladder,Dysuria,Chronic neck pain, Moderate episode of recurrent major depressive disorder)  ?Chronic Disease Management support and education needs related to (Traumatic brain injury,Chronic Pain Syndrome,Neurogenic bladder,Dysuria,Chronic neck pain, Moderate episode of recurrent major depressive disorder)  ? ?RNCM Clinical Goal(s):  ?Patient will verbalize basic understanding of  Traumatic brain injury,Chronic Pain Syndrome,Neurogenic bladder,Dysuria,Chronic neck pain, Moderate episode of recurrent major depressive disorder disease process and self health management plan as evidenced by patient/wife will report having no disease exacerbation  ?take all medications exactly as prescribed and will call provider for medication related questions as evidenced by patient/wife will report having no missed doses of patient's prescribed medications  ?demonstrate Improved health management independence as evidenced by patient/wife will report 100% adherence to his prescribed treatment plan  ?continue to work with RN Care Manager to address care management and care coordination needs related to  Traumatic brain injury,Chronic Pain Syndrome,Neurogenic bladder,Dysuria,Chronic neck pain, Moderate episode of recurrent major depressive disorder as evidenced by adherence to CM Team Scheduled appointments through collaboration with RN Care manager, provider, and care team.  ? ?Interventions: ?1:1 collaboration  with primary care provider regarding development and update of comprehensive plan of care as evidenced by provider attestation and co-signature ?Inter-disciplinary care team collaboration (see longitudinal plan of care) ?Evaluation of current treatment plan related to  self management and patient's adherence to plan as established by provider ?Completed successful outbound call with wife [name]  ? ?Depression Interventions:  (Status:  Goal on track:  Yes.)  Long Term Goal ?Completed successful outbound call with wife and caregiver [name]  ?Evaluation of current treatment plan related to Depression, self-management and patient's adherence to plan as established by provider ?Discussed PCP referral to [facility] for Psychiatric care and counseling ?Determined patient feels satisfied with this provider for ongoing treatment, wife denies having questions or concerns at this time ?Discussed wife's concerns regarding patient may becoming anxious before leaving his home to attend outpatient PT, wife is requesting new referral (see other goal) ?Discussed plans with patient for ongoing care management follow up and provided patient with direct contact information for care management team ? ?Impaired Physical Mobility Interventions:  (Status:  Goal on track:  Yes.)  Long Term Goal ?Completed successful outbound call with wife [name]  ?Evaluation of current treatment plan related to  Impaired Physical Mobility , self-management and patient's adherence to plan as established by provider ?Assessed for ongoing DME needs, wife denies  ?Assessed for falls, none reported  ?Determined patient has missed several PT visits, wife [name] feels he becomes anxious the night before his appointment causing GI upset  ?  Wife [name] would like a new in home PT referral so patient is able to resume services without increased anxiety and improved adherence to his PT ?Collaboration with PCP regarding wife's request to send a new PT  referral for in home services  ?Encouraged wife to keep this RN CM and or PCP well informed of ongoing mobility concerns and or DME needs  ?Sent patient message requesting wife [name] rescheduled patient's rec

## 2022-01-04 NOTE — Patient Instructions (Signed)
Visit Information ? ?Thank you for taking time to visit with me today. Please don't hesitate to contact me if I can be of assistance to you before our next scheduled telephone appointment. ? ?Following are the goals we discussed today:  ?(Copy and paste patient goals from clinical care plan here) ? ?Our next appointment is by telephone on 04/04/22 at 10:30 AM  ? ?Please call the care guide team at [phone] if you need to cancel or reschedule your appointment.  ? ?If you are experiencing a Mental Health or [place] or need someone to talk to, please call 1-800-273-TALK (toll free, 24 hour hotline)  ? ?Patient verbalizes understanding of instructions and care plan provided today and agrees to view in [place]. Active MyChart status confirmed with patient.   ? ?Barb Merino, RN, BSN, CCM ?Care Management Coordinator ?[place] [facility]  ?Direct Phone: [phone] ? ? ?

## 2022-01-05 ENCOUNTER — Telehealth: Payer: Medicare HMO

## 2022-01-07 DIAGNOSIS — F331 Major depressive disorder, recurrent, moderate: Secondary | ICD-10-CM

## 2022-01-09 ENCOUNTER — Telehealth: Payer: Self-pay | Admitting: Nurse Practitioner

## 2022-01-09 NOTE — Telephone Encounter (Signed)
Tried calling patient to schedule Medicare Annual Wellness Visit (AWV) either virtually or in office. ? ?No answer ? ? ?Last AWV 12/29/20 ? please schedule at anytime with [facility] - [facility]  ? ? ? ?

## 2022-01-17 ENCOUNTER — Telehealth: Payer: Self-pay | Admitting: Nurse Practitioner

## 2022-01-17 NOTE — Telephone Encounter (Signed)
Tried calling patient to schedule Medicare Annual Wellness Visit (AWV) either virtually or in office.   ? ?No answer  ? ?Last AWV ;12/29/20 ? please schedule at anytime with [facility] - [facility]  ? ? ? ?

## 2022-02-01 ENCOUNTER — Telehealth: Payer: Self-pay | Admitting: Nurse Practitioner

## 2022-02-01 NOTE — Telephone Encounter (Signed)
Tried calling patient to schedule Medicare Annual Wellness Visit (AWV) either virtually or in office.    No answer  Last AWV ;12/29/20  please schedule at anytime with [facility]

## 2022-02-20 ENCOUNTER — Telehealth: Payer: Self-pay | Admitting: Nurse Practitioner

## 2022-02-20 NOTE — Telephone Encounter (Signed)
Tried called patient to schedule Medicare Annual Wellness Visit (AWV) either virtually or in office.    No answer  Last AWV 12/29/20 ; please schedule at anytime with [facility]    Also sent my chart message

## 2022-03-21 ENCOUNTER — Ambulatory Visit: Payer: Self-pay

## 2022-03-21 ENCOUNTER — Telehealth: Payer: Self-pay

## 2022-03-21 ENCOUNTER — Telehealth: Payer: Medicare HMO

## 2022-03-21 DIAGNOSIS — N319 Neuromuscular dysfunction of bladder, unspecified: Secondary | ICD-10-CM

## 2022-03-21 DIAGNOSIS — G894 Chronic pain syndrome: Secondary | ICD-10-CM

## 2022-03-21 DIAGNOSIS — R300 Dysuria: Secondary | ICD-10-CM

## 2022-03-21 DIAGNOSIS — S069X3S Unspecified intracranial injury 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, sequela: Secondary | ICD-10-CM

## 2022-03-21 DIAGNOSIS — F331 Major depressive disorder, recurrent, moderate: Secondary | ICD-10-CM

## 2022-03-21 NOTE — Patient Instructions (Addendum)
Visit Information  Thank you for taking time to visit with me today. Please don't hesitate to contact me if I can be of assistance to you before our next scheduled telephone appointment.  Following are the goals we discussed today:  Take all medications as prescribed Attend all scheduled provider appointments Call pharmacy for medication refills 3-7 days in advance of running out of medications Call provider office for new concerns or questions  You should receive a call from [place] within the next 2 weeks to schedule your initial home visit  Please review the printed diabetes educational materials and let your health care team know if you have questions (materials will be mailed to your home) Your PCP will send in a new prescription for a glucometer, please use as directed and ask your pharmacist for help if needed   Our next appointment is by telephone on 04/04/22 at 10:30 AM   Please call the care guide team at [phone] if you need to cancel or reschedule your appointment.   If you are experiencing a Mental Health or [place] or need someone to talk to, please call 1-800-273-TALK (toll free, 24 hour hotline)   Patient verbalizes understanding of instructions and care plan provided today and agrees to view in [place]. Active MyChart status and patient understanding of how to access instructions and care plan via MyChart confirmed with patient.     Barb Merino, RN, BSN, CCM Care Management Coordinator [place] [facility]  Direct Phone: [phone]

## 2022-03-21 NOTE — Telephone Encounter (Signed)
  Care Management   Follow Up Note   03/21/2022 Name: Vincent Tesch Sr. MRN: [id number] DOB: 09/26/1965   Referred by: [name], FNP Reason for referral : Chronic Care Management (RN CM Follow up call )  Received voice message from wife [name] stating she needs assistance with coordination for in home PT and CNA.  An unsuccessful telephone outreach was attempted today. The patient was referred to the case management team for assistance with care management and care coordination. Spoke with wife [name] briefly, she states she is currently at the PCP office speaking with PCP provider [name] FNP.  Follow Up Plan: Telephone follow up appointment with care management team member scheduled for: 04/04/22  Barb Merino, RN, BSN, CCM Care Management Coordinator [place] [facility]  Direct Phone: [phone]

## 2022-03-21 NOTE — Chronic Care Management (AMB) (Signed)
Chronic Care Management   CCM RN Visit Note  03/21/2022 Name: Vincent Tabora Sr. MRN: [id number] DOB: Jun 02, 1966  Subjective: Vincent Mems Sr. is a 56 y.o. year old male who is a primary care patient of [name], [place]. The care management team was consulted for assistance with disease management and care coordination needs.    Engaged with patient by telephone for follow up visit in response to provider referral for case management and/or care coordination services.   Consent to Services:  The patient was given information about Chronic Care Management services, agreed to services, and gave verbal consent prior to initiation of services.  Please see initial visit note for detailed documentation.   Patient agreed to services and verbal consent obtained.   Assessment: Review of patient past medical history, allergies, medications, health status, including review of consultants reports, laboratory and other test data, was performed as part of comprehensive evaluation and provision of chronic care management services.   SDOH (Social Determinants of Health) assessments and interventions performed:  Yes, patient referral sent to [facility] per PCP  CCM Care Plan  No Known Allergies  Outpatient Encounter Medications as of 03/21/2022  Medication Sig   diclofenac sodium (VOLTAREN) 1 % GEL Apply 2 g 4 (four) times daily topically.   docusate sodium (COLACE) 50 MG capsule Take 50 mg by mouth 2 (two) times daily. (Patient not taking: Reported on 06/27/2021)   furosemide (LASIX) 20 MG tablet TAKE 1 TABLET BY MOUTH EVERY DAY (Patient taking differently: daily as needed.)   gabapentin (NEURONTIN) 600 MG tablet TAKE 1 TABLET THREE TIMES DAILY (Patient taking differently: 2 (two) times daily.)   GNP MAGNESIUM OXIDE 250 MG TABS TAKE 1 TABLET (250 MG TOTAL) BY MOUTH EVERY EVENING.   latanoprost (XALATAN) 0.005 % ophthalmic solution Place 1 drop into both eyes nightly.   lidocaine  (LIDODERM) 5 % Place 1 patch onto the skin daily. Remove & Discard patch within 12 hours or as directed by MD   meloxicam (MOBIC) 7.5 MG tablet Take 1 tablet (7.5 mg total) by mouth daily.   methocarbamol (ROBAXIN) 500 MG tablet TAKE 1 TABLET EVERY 8 HOURS AS NEEDED FOR MUSCLE SPASM(S)   naphazoline-glycerin (CLEAR EYES) 0.012-0.2 % SOLN Place 2 drops 4 (four) times daily -  with meals and at bedtime into both eyes.   oxycodone-acetaminophen (PERCOCET) 2.5-325 MG tablet Take 1 tablet by mouth every 4 (four) hours as needed for pain. 5-3.25 per pt wife   sertraline (ZOLOFT) 50 MG tablet Take 75 mg by mouth daily.   tamsulosin (FLOMAX) 0.4 MG CAPS capsule TAKE 1 CAPSULE EVERY DAY   timolol (TIMOPTIC) 0.5 % ophthalmic solution    No facility-administered encounter medications on file as of 03/21/2022.    Patient Active Problem List   Diagnosis Date Noted   Pseudomonas urinary tract infection    Neurogenic bladder    Neck pain    Chronic pain syndrome    Transaminitis    Diffuse TBI w loss of consciousness of unsp duration, init ([place]) 05/21/2017   Tetraplegia ([place]) 05/21/2017   Fracture    Trauma    Polysubstance abuse (HCC)    Fever    Tachycardia    Post-operative pain    Leukocytosis    SIRS (systemic inflammatory response syndrome) (HCC)    Acute blood loss anemia    TBI (traumatic brain injury) ([place]) 05/03/2017   Polysubstance abuse ([place]) 01/12/2017    Conditions to be addressed/monitored: Traumatic brain  injury, 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, Chronic Pain Syndrome, Neurogenic bladder, Dysuria, Chronic neck pain, Moderate episode of recurrent major depressive disorder   Care Plan : RN Care Manager Plan of Care  Updates made by Lynne Logan, RN since 03/21/2022 12:00 AM     Problem: No plan of care established for management of chronic disease states (Traumatic brain injury,Chronic Pain Syndrome,Neurogenic bladder,Dysuria,Chronic neck pain,  Moderate episode of recurrent major depressive disorder)   Priority: High     Long-Range Goal: Establishment of plan of care for management of chronic disease states ((Traumatic brain injury,Chronic Pain Syndrome,Neurogenic bladder,Dysuria,Chronic neck pain, Moderate episode of recurrent major depressive disorder)   Start Date: 09/18/2021  Expected End Date: 09/18/2022  Recent Progress: On track  Priority: High  Note:   Current Barriers:  Knowledge Deficits related to plan of care for management of (Traumatic brain injury,Chronic Pain Syndrome,Neurogenic bladder,Dysuria,Chronic neck pain, Moderate episode of recurrent major depressive disorder)  Chronic Disease Management support and education needs related to (Traumatic brain injury,Chronic Pain Syndrome,Neurogenic bladder,Dysuria,Chronic neck pain, Moderate episode of recurrent major depressive disorder)   RNCM Clinical Goal(s):  Patient will verbalize basic understanding of  Traumatic brain injury,Chronic Pain Syndrome,Neurogenic bladder,Dysuria,Chronic neck pain, Moderate episode of recurrent major depressive disorder disease process and self health management plan as evidenced by patient/wife will report having no disease exacerbation  take all medications exactly as prescribed and will call provider for medication related questions as evidenced by patient/wife will report having no missed doses of patient's prescribed medications  demonstrate Improved health management independence as evidenced by patient/wife will report 100% adherence to his prescribed treatment plan  continue to work with RN Care Manager to address care management and care coordination needs related to  Traumatic brain injury,Chronic Pain Syndrome,Neurogenic bladder,Dysuria,Chronic neck pain, Moderate episode of recurrent major depressive disorder as evidenced by adherence to CM Team Scheduled appointments through collaboration with RN Care manager, provider, and care team.    Interventions: 1:1 collaboration with primary care provider regarding development and update of comprehensive plan of care as evidenced by provider attestation and co-signature Inter-disciplinary care team collaboration (see longitudinal plan of care) Evaluation of current treatment plan related to  self management and patient's adherence to plan as established by provider Completed successful outbound call with wife [name]   Depression Interventions:  (Status:  Condition stable.  Not addressed this visit.)  Long Term Goal Completed successful outbound call with wife and caregiver [name]  Evaluation of current treatment plan related to Depression, self-management and patient's adherence to plan as established by provider Discussed PCP referral to [facility] for Psychiatric care and counseling Determined patient feels satisfied with this provider for ongoing treatment, wife denies having questions or concerns at this time Discussed wife's concerns regarding patient may becoming anxious before leaving his home to attend outpatient PT, wife is requesting new referral (see other goal) Discussed plans with patient for ongoing care management follow up and provided patient with direct contact information for care management team  Impaired Physical Mobility Interventions:  (Status:  Goal on track:  Yes.)  Long Term Goal Completed successful inbound call with wife [name]  Evaluation of current treatment plan related to  Impaired Physical Mobility , self-management and patient's adherence to plan as established by provider Determined wife is requesting assistance with coordination of care including in home PT and nurse aide Determined patient has missed multiple appointments with PCP, wife reports having several barriers  including difficulty getting patient to keep appointments, she also has other barriers related to her own health  Educated wife on Remote Health services and discussed  patient may benefit from transitioning to this provider for primary care services Determined wife feels patient is more likely to receive the medical care he needs by transitioning to a provider who can make house calls and provide the additional services needed including in home PT and lab checks Collaborated with PCP [name] FNP regarding wife's request to have patient transition to Remote [facility], Doreene Burke will forward the referral  Educated wife on referral process and next steps  Discussed plans with patient for ongoing care management follow up and provided patient with direct contact information for care management team   Diabetes Interventions:  (Status:  New goal.) Long Term Goal Completed successful inbound call with wife [name]  Determined patient's pain doctor checked his A1c during his in office visit Discussed per wife the patient's A1c has increased to 8.0 Assessed patient's understanding of A1c goal: <7% Provided education to patient about basic DM disease process Counseled on importance of regular laboratory monitoring as prescribed Discussed patient does not have a glucometer at this time Sent message to PCP provider requesting an Rx be sent to patient's preferred pharmacy for a glucometer Mailed printed educational materials related to Diabetes Management  Lab Results  Component Value Date   HGBA1C 8.0 (H) 03/20/2022 (outside lab)  Patient Goals/Self-Care Activities: Take all medications as prescribed Attend all scheduled provider appointments Call pharmacy for medication refills 3-7 days in advance of running out of medications Call provider office for new concerns or questions  You should receive a call from [facility] within the next 2 weeks to schedule your initial home visit  Please review the printed diabetes educational materials and let your health care team know if you have questions (materials will be mailed to your home) Your PCP will send in a new  prescription for a glucometer, please use as directed and ask your pharmacist for help if needed   Follow Up Plan:  Telephone follow up appointment with care management team member scheduled for:  04/04/22     Barb Merino, RN, BSN, CCM Care Management Coordinator [place] [facility]  Direct Phone: [phone]

## 2022-03-26 ENCOUNTER — Telehealth: Payer: Self-pay | Admitting: Nurse Practitioner

## 2022-03-26 NOTE — Telephone Encounter (Signed)
Left message for patient to call back and schedule Medicare Annual Wellness Visit (AWV) either virtually or in office.  Left both my jabber number [phone] and office number    Last AWV ;12/29/20  please schedule at anytime with [facility]

## 2022-03-28 ENCOUNTER — Ambulatory Visit: Payer: Self-pay

## 2022-03-28 ENCOUNTER — Telehealth: Payer: Medicare HMO

## 2022-03-28 NOTE — Chronic Care Management (AMB) (Signed)
Care Management    RN Visit Note  03/28/2022 Name: Vincent Dauphinais Sr. MRN: [id number] DOB: July 27, 1966  Subjective: Vincent Mems Sr. is a 56 y.o. year old male who is a primary care patient of [name], [place]. The care management team was consulted for assistance with disease management and care coordination needs.    Engaged with patient by telephone for follow up visit in response to provider referral for case management and/or care coordination services.   Consent to Services:   [name] was given information about Care Management services today including:  Care Management services includes personalized support from designated clinical staff supervised by his physician, including individualized plan of care and coordination with other care providers 24/7 contact phone numbers for assistance for urgent and routine care needs. The patient may stop case management services at any time by phone call to the office staff.  Patient agreed to services and consent obtained.   Assessment: Review of patient past medical history, allergies, medications, health status, including review of consultants reports, laboratory and other test data, was performed as part of comprehensive evaluation and provision of chronic care management services.   SDOH (Social Determinants of Health) assessments and interventions performed:    Care Plan  No Known Allergies  Outpatient Encounter Medications as of 03/28/2022  Medication Sig   diclofenac sodium (VOLTAREN) 1 % GEL Apply 2 g 4 (four) times daily topically.   docusate sodium (COLACE) 50 MG capsule Take 50 mg by mouth 2 (two) times daily. (Patient not taking: Reported on 06/27/2021)   furosemide (LASIX) 20 MG tablet TAKE 1 TABLET BY MOUTH EVERY DAY (Patient taking differently: daily as needed.)   gabapentin (NEURONTIN) 600 MG tablet TAKE 1 TABLET THREE TIMES DAILY (Patient taking differently: 2 (two) times daily.)   GNP MAGNESIUM  OXIDE 250 MG TABS TAKE 1 TABLET (250 MG TOTAL) BY MOUTH EVERY EVENING.   latanoprost (XALATAN) 0.005 % ophthalmic solution Place 1 drop into both eyes nightly.   lidocaine (LIDODERM) 5 % Place 1 patch onto the skin daily. Remove & Discard patch within 12 hours or as directed by MD   meloxicam (MOBIC) 7.5 MG tablet Take 1 tablet (7.5 mg total) by mouth daily.   methocarbamol (ROBAXIN) 500 MG tablet TAKE 1 TABLET EVERY 8 HOURS AS NEEDED FOR MUSCLE SPASM(S)   naphazoline-glycerin (CLEAR EYES) 0.012-0.2 % SOLN Place 2 drops 4 (four) times daily -  with meals and at bedtime into both eyes.   oxycodone-acetaminophen (PERCOCET) 2.5-325 MG tablet Take 1 tablet by mouth every 4 (four) hours as needed for pain. 5-3.25 per pt wife   sertraline (ZOLOFT) 50 MG tablet Take 75 mg by mouth daily.   tamsulosin (FLOMAX) 0.4 MG CAPS capsule TAKE 1 CAPSULE EVERY DAY   timolol (TIMOPTIC) 0.5 % ophthalmic solution    No facility-administered encounter medications on file as of 03/28/2022.    Patient Active Problem List   Diagnosis Date Noted   Pseudomonas urinary tract infection    Neurogenic bladder    Neck pain    Chronic pain syndrome    Transaminitis    Diffuse TBI w loss of consciousness of unsp duration, init ([place]) 05/21/2017   Tetraplegia ([place]) 05/21/2017   Fracture    Trauma    Polysubstance abuse (HCC)    Fever    Tachycardia    Post-operative pain    Leukocytosis    SIRS (systemic inflammatory response syndrome) (HCC)    Acute blood loss  anemia    TBI (traumatic brain injury) ([place]) 05/03/2017   Polysubstance abuse ([place]) 01/12/2017    Conditions to be addressed/monitored: Traumatic brain injury,Chronic Pain Syndrome,Neurogenic bladder,Dysuria,Chronic neck pain, Moderate episode of recurrent major depressive disorder  Care Plan : RN Care Manager Plan of Care  Updates made by Lynne Logan, RN since 03/28/2022 12:00 AM     Problem: No plan of care established for management of chronic  disease states (Traumatic brain injury,Chronic Pain Syndrome,Neurogenic bladder,Dysuria,Chronic neck pain, Moderate episode of recurrent major depressive disorder)   Priority: High     Long-Range Goal: Establishment of plan of care for management of chronic disease states ((Traumatic brain injury,Chronic Pain Syndrome,Neurogenic bladder,Dysuria,Chronic neck pain, Moderate episode of recurrent major depressive disorder)   Start Date: 09/18/2021  Expected End Date: 09/18/2022  Recent Progress: On track  Priority: High  Note:   Current Barriers:  Knowledge Deficits related to plan of care for management of (Traumatic brain injury,Chronic Pain Syndrome,Neurogenic bladder,Dysuria,Chronic neck pain, Moderate episode of recurrent major depressive disorder)  Chronic Disease Management support and education needs related to (Traumatic brain injury,Chronic Pain Syndrome,Neurogenic bladder,Dysuria,Chronic neck pain, Moderate episode of recurrent major depressive disorder)   RNCM Clinical Goal(s):  Patient will verbalize basic understanding of  Traumatic brain injury,Chronic Pain Syndrome,Neurogenic bladder,Dysuria,Chronic neck pain, Moderate episode of recurrent major depressive disorder disease process and self health management plan as evidenced by patient/wife will report having no disease exacerbation  take all medications exactly as prescribed and will call provider for medication related questions as evidenced by patient/wife will report having no missed doses of patient's prescribed medications  demonstrate Improved health management independence as evidenced by patient/wife will report 100% adherence to his prescribed treatment plan  continue to work with RN Care Manager to address care management and care coordination needs related to  Traumatic brain injury,Chronic Pain Syndrome,Neurogenic bladder,Dysuria,Chronic neck pain, Moderate episode of recurrent major depressive disorder as evidenced by  adherence to CM Team Scheduled appointments through collaboration with RN Care manager, provider, and care team.   Interventions: 1:1 collaboration with primary care provider regarding development and update of comprehensive plan of care as evidenced by provider attestation and co-signature Inter-disciplinary care team collaboration (see longitudinal plan of care) Evaluation of current treatment plan related to  self management and patient's adherence to plan as established by provider Completed successful outbound call with wife [name]   Impaired Physical Mobility Interventions:  (Status:  Goal on track:  Yes.)  Long Term Goal Completed successful inbound call with wife [name]  Evaluation of current treatment plan related to  Impaired Physical Mobility , self-management and patient's adherence to plan as established by provider Determined wife is requesting assistance with coordination of care including in home PT and nurse aide Determined patient has missed multiple appointments with PCP, wife reports having several barriers including difficulty getting patient to keep appointments, she also has other barriers related to her own health  Educated wife on Remote Health services and discussed patient may benefit from transitioning to this provider for primary care services Determined wife feels patient is more likely to receive the medical care he needs by transitioning to a provider who can make house calls and provide the additional services needed including in home PT and lab checks Collaborated with PCP [name] FNP regarding wife's request to have patient transition to Remote [facility], Doreene Burke will forward the referral  Educated wife on referral process and next steps  Discussed plans with patient for ongoing care  management follow up and provided patient with direct contact information for care management team Inbound call received from wife [name] Determined wife [name] has not received a  call from [place] and she would like to follow up on the referral Placed outbound call to [facility] [phone], spoke with Vincent Rubin RN regarding referral  Completed case collaboration with [name] to determine patient's current medical needs and active problems Determined [name] will contact [name] to schedule a home visit, [name] will notify this RN once a determination is made if patient will be excepted by this provider   Patient Goals/Self-Care Activities: Take all medications as prescribed Attend all scheduled provider appointments Call pharmacy for medication refills 3-7 days in advance of running out of medications Call provider office for new concerns or questions  You should receive a call from [employer] within the next 2 weeks to schedule your initial home visit  Please review the printed diabetes educational materials and let your health care team know if you have questions (materials will be mailed to your home) Your PCP will send in a new prescription for a glucometer, please use as directed and ask your pharmacist for help if needed   Follow Up Plan:  Telephone follow up appointment with care management team member scheduled for:  04/04/22     Barb Merino, RN, BSN, CCM Care Management Coordinator [facility] [facility]  Direct Phone: [phone]

## 2022-03-28 NOTE — Chronic Care Management (AMB) (Deleted)
Chronic   Care Management    RN Visit Note  03/28/2022 Name: [name] Sr. MRN: [id number] DOB: 02-08-1966  Subjective: Vincent Mems Sr. is a 56 y.o. year old male who is a primary care patient of [name], [place]. The care management team was consulted for assistance with disease management and care coordination needs.    Engaged with patient by telephone for follow up visit in response to provider referral for case management and/or care coordination services.   Consent to Services:   [name] was given information about Care Management services today including:  Care Management services includes personalized support from designated clinical staff supervised by his physician, including individualized plan of care and coordination with other care providers 24/7 contact phone numbers for assistance for urgent and routine care needs. The patient may stop case management services at any time by phone call to the office staff.  Patient agreed to services and consent obtained.   Assessment: Review of patient past medical history, allergies, medications, health status, including review of consultants reports, laboratory and other test data, was performed as part of comprehensive evaluation and provision of chronic care management services.   SDOH (Social Determinants of Health) assessments and interventions performed:    Care Plan   No Known Allergies  Outpatient Encounter Medications as of 03/21/2022  Medication Sig   diclofenac sodium (VOLTAREN) 1 % GEL Apply 2 g 4 (four) times daily topically.   docusate sodium (COLACE) 50 MG capsule Take 50 mg by mouth 2 (two) times daily. (Patient not taking: Reported on 06/27/2021)   furosemide (LASIX) 20 MG tablet TAKE 1 TABLET BY MOUTH EVERY DAY (Patient taking differently: daily as needed.)   gabapentin (NEURONTIN) 600 MG tablet TAKE 1 TABLET THREE TIMES DAILY (Patient taking differently: 2 (two) times daily.)   GNP  MAGNESIUM OXIDE 250 MG TABS TAKE 1 TABLET (250 MG TOTAL) BY MOUTH EVERY EVENING.   latanoprost (XALATAN) 0.005 % ophthalmic solution Place 1 drop into both eyes nightly.   lidocaine (LIDODERM) 5 % Place 1 patch onto the skin daily. Remove & Discard patch within 12 hours or as directed by MD   meloxicam (MOBIC) 7.5 MG tablet Take 1 tablet (7.5 mg total) by mouth daily.   methocarbamol (ROBAXIN) 500 MG tablet TAKE 1 TABLET EVERY 8 HOURS AS NEEDED FOR MUSCLE SPASM(S)   naphazoline-glycerin (CLEAR EYES) 0.012-0.2 % SOLN Place 2 drops 4 (four) times daily -  with meals and at bedtime into both eyes.   oxycodone-acetaminophen (PERCOCET) 2.5-325 MG tablet Take 1 tablet by mouth every 4 (four) hours as needed for pain. 5-3.25 per pt wife   sertraline (ZOLOFT) 50 MG tablet Take 75 mg by mouth daily.   tamsulosin (FLOMAX) 0.4 MG CAPS capsule TAKE 1 CAPSULE EVERY DAY   timolol (TIMOPTIC) 0.5 % ophthalmic solution    No facility-administered encounter medications on file as of 03/21/2022.    Patient Active Problem List   Diagnosis Date Noted   Pseudomonas urinary tract infection    Neurogenic bladder    Neck pain    Chronic pain syndrome    Transaminitis    Diffuse TBI w loss of consciousness of unsp duration, init ([place]) 05/21/2017   Tetraplegia ([place]) 05/21/2017   Fracture    Trauma    Polysubstance abuse (HCC)    Fever    Tachycardia    Post-operative pain    Leukocytosis    SIRS (systemic inflammatory response syndrome) ([place])  Acute blood loss anemia    TBI (traumatic brain injury) ([place]) 05/03/2017   Polysubstance abuse ([place]) 01/12/2017    Conditions to be addressed/monitored: Traumatic brain injury, 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, Chronic Pain Syndrome, Neurogenic bladder, Dysuria, Chronic neck pain, Moderate episode of recurrent major depressive disorder   Care Plan : RN Care Manager Plan of Care  Updates made by Lynne Logan, RN since 03/21/2022 12:00 AM      Problem: No plan of care established for management of chronic disease states (Traumatic brain injury,Chronic Pain Syndrome,Neurogenic bladder,Dysuria,Chronic neck pain, Moderate episode of recurrent major depressive disorder)   Priority: High     Long-Range Goal: Establishment of plan of care for management of chronic disease states ((Traumatic brain injury,Chronic Pain Syndrome,Neurogenic bladder,Dysuria,Chronic neck pain, Moderate episode of recurrent major depressive disorder)   Start Date: 09/18/2021  Expected End Date: 09/18/2022  Recent Progress: On track  Priority: High  Note:   Current Barriers:  Knowledge Deficits related to plan of care for management of (Traumatic brain injury,Chronic Pain Syndrome,Neurogenic bladder,Dysuria,Chronic neck pain, Moderate episode of recurrent major depressive disorder)  Chronic Disease Management support and education needs related to (Traumatic brain injury,Chronic Pain Syndrome,Neurogenic bladder,Dysuria,Chronic neck pain, Moderate episode of recurrent major depressive disorder)   RNCM Clinical Goal(s):  Patient will verbalize basic understanding of  Traumatic brain injury,Chronic Pain Syndrome,Neurogenic bladder,Dysuria,Chronic neck pain, Moderate episode of recurrent major depressive disorder disease process and self health management plan as evidenced by patient/wife will report having no disease exacerbation  take all medications exactly as prescribed and will call provider for medication related questions as evidenced by patient/wife will report having no missed doses of patient's prescribed medications  demonstrate Improved health management independence as evidenced by patient/wife will report 100% adherence to his prescribed treatment plan  continue to work with RN Care Manager to address care management and care coordination needs related to  Traumatic brain injury,Chronic Pain Syndrome,Neurogenic bladder,Dysuria,Chronic neck pain, Moderate  episode of recurrent major depressive disorder as evidenced by adherence to CM Team Scheduled appointments through collaboration with RN Care manager, provider, and care team.   Interventions: 1:1 collaboration with primary care provider regarding development and update of comprehensive plan of care as evidenced by provider attestation and co-signature Inter-disciplinary care team collaboration (see longitudinal plan of care) Evaluation of current treatment plan related to  self management and patient's adherence to plan as established by provider Completed successful outbound call with wife [name]   Depression Interventions:  (Status:  Condition stable.  Not addressed this visit.)  Long Term Goal Completed successful outbound call with wife and caregiver Oney Folz  Evaluation of current treatment plan related to Depression, self-management and patient's adherence to plan as established by provider Discussed PCP referral to [facility] for Psychiatric care and counseling Determined patient feels satisfied with this provider for ongoing treatment, wife denies having questions or concerns at this time Discussed wife's concerns regarding patient may becoming anxious before leaving his home to attend outpatient PT, wife is requesting new referral (see other goal) Discussed plans with patient for ongoing care management follow up and provided patient with direct contact information for care management team  Impaired Physical Mobility Interventions:  (Status:  Goal on track:  Yes.)  Long Term Goal Completed successful inbound call with wife [name]  Evaluation of current treatment plan related to  Impaired Physical Mobility , self-management and patient's adherence to plan as established by provider Determined  wife is requesting assistance with coordination of care including in home PT and nurse aide Determined patient has missed multiple appointments with PCP, wife reports having several barriers  including difficulty getting patient to keep appointments, she also has other barriers related to her own health  Educated wife on Remote [facility] services and discussed patient may benefit from transitioning to this provider for primary care services Determined wife feels patient is more likely to receive the medical care he needs by transitioning to a provider who can make house calls and provide the additional services needed including in home PT and lab checks Collaborated with PCP [name] FNP regarding wife's request to have patient transition to [facility], Doreene Burke will forward the referral  Educated wife on referral process and next steps  Discussed plans with patient for ongoing care management follow up and provided patient with direct contact information for care management team   Diabetes Interventions:  (Status:  New goal.) Long Term Goal Completed successful inbound call with wife [name]  Determined patient's pain doctor checked his A1c during his in office visit Discussed per wife the patient's A1c has increased to 8.0 Assessed patient's understanding of A1c goal: <7% Provided education to patient about basic DM disease process Counseled on importance of regular laboratory monitoring as prescribed Discussed patient does not have a glucometer at this time Sent message to PCP provider requesting an Rx be sent to patient's preferred pharmacy for a glucometer Mailed printed educational materials related to Diabetes Management  Lab Results  Component Value Date   HGBA1C 8.0 (H) 03/20/2022 (outside lab)  Patient Goals/Self-Care Activities: Take all medications as prescribed Attend all scheduled provider appointments Call pharmacy for medication refills 3-7 days in advance of running out of medications Call provider office for new concerns or questions  You should receive a call from [place] within the next 2 weeks to schedule your initial home visit  Please review the  printed diabetes educational materials and let your health care team know if you have questions (materials will be mailed to your home) Your PCP will send in a new prescription for a glucometer, please use as directed and ask your pharmacist for help if needed   Follow Up Plan:  Telephone follow up appointment with care management team member scheduled for:  04/04/22     Barb Merino, RN, BSN, CCM Care Management Coordinator [place] [facility]  Direct Phone: [phone]

## 2022-04-03 ENCOUNTER — Telehealth: Payer: Self-pay | Admitting: Nurse Practitioner

## 2022-04-03 NOTE — Telephone Encounter (Signed)
Tried calling patient to schedule Medicare Annual Wellness Visit (AWV) either virtually or in office.  Left both my jabber number [phone] and office number   No answer   Last AWV  12/29/20 ; please schedule at anytime with [facility]

## 2022-04-04 ENCOUNTER — Telehealth: Payer: Medicare HMO

## 2022-04-04 ENCOUNTER — Ambulatory Visit: Payer: Self-pay

## 2022-04-04 DIAGNOSIS — S069X3S Unspecified intracranial injury 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, sequela: Secondary | ICD-10-CM

## 2022-04-04 DIAGNOSIS — F331 Major depressive disorder, recurrent, moderate: Secondary | ICD-10-CM

## 2022-04-04 DIAGNOSIS — G894 Chronic pain syndrome: Secondary | ICD-10-CM

## 2022-04-04 DIAGNOSIS — N319 Neuromuscular dysfunction of bladder, unspecified: Secondary | ICD-10-CM

## 2022-04-04 DIAGNOSIS — G8929 Other chronic pain: Secondary | ICD-10-CM

## 2022-04-04 DIAGNOSIS — Z8659 Personal history of other mental and behavioral disorders: Secondary | ICD-10-CM

## 2022-04-04 DIAGNOSIS — M542 Cervicalgia: Secondary | ICD-10-CM

## 2022-04-04 DIAGNOSIS — R300 Dysuria: Secondary | ICD-10-CM

## 2022-04-04 NOTE — Chronic Care Management (AMB) (Signed)
Care Management    RN Visit Note  04/04/2022 Name: Vincent Highley Sr. MRN: [id number] DOB: 1966/01/18  Subjective: Vincent Mems Sr. is a 56 y.o. year old male who is a primary care patient of [name], [place]. The care management team was consulted for assistance with disease management and care coordination needs.    Collaboration with Reche Dixon RN with [facility]   for  patient status update  in response to provider referral for case management and/or care coordination services.   Consent to Services:   [name] was given information about Care Management services today including:  Care Management services includes personalized support from designated clinical staff supervised by his physician, including individualized plan of care and coordination with other care providers 24/7 contact phone numbers for assistance for urgent and routine care needs. The patient may stop case management services at any time by phone call to the office staff.  Patient agreed to services and consent obtained.   Assessment: Review of patient past medical history, allergies, medications, health status, including review of consultants reports, laboratory and other test data, was performed as part of comprehensive evaluation and provision of chronic care management services.   SDOH (Social Determinants of Health) assessments and interventions performed:    Care Plan  No Known Allergies  Outpatient Encounter Medications as of 04/04/2022  Medication Sig   diclofenac sodium (VOLTAREN) 1 % GEL Apply 2 g 4 (four) times daily topically.   docusate sodium (COLACE) 50 MG capsule Take 50 mg by mouth 2 (two) times daily. (Patient not taking: Reported on 06/27/2021)   furosemide (LASIX) 20 MG tablet TAKE 1 TABLET BY MOUTH EVERY DAY (Patient taking differently: daily as needed.)   gabapentin (NEURONTIN) 600 MG tablet TAKE 1 TABLET THREE TIMES DAILY (Patient taking differently: 2 (two)  times daily.)   GNP MAGNESIUM OXIDE 250 MG TABS TAKE 1 TABLET (250 MG TOTAL) BY MOUTH EVERY EVENING.   latanoprost (XALATAN) 0.005 % ophthalmic solution Place 1 drop into both eyes nightly.   lidocaine (LIDODERM) 5 % Place 1 patch onto the skin daily. Remove & Discard patch within 12 hours or as directed by MD   meloxicam (MOBIC) 7.5 MG tablet Take 1 tablet (7.5 mg total) by mouth daily.   methocarbamol (ROBAXIN) 500 MG tablet TAKE 1 TABLET EVERY 8 HOURS AS NEEDED FOR MUSCLE SPASM(S)   naphazoline-glycerin (CLEAR EYES) 0.012-0.2 % SOLN Place 2 drops 4 (four) times daily -  with meals and at bedtime into both eyes.   oxycodone-acetaminophen (PERCOCET) 2.5-325 MG tablet Take 1 tablet by mouth every 4 (four) hours as needed for pain. 5-3.25 per pt wife   sertraline (ZOLOFT) 50 MG tablet Take 75 mg by mouth daily.   tamsulosin (FLOMAX) 0.4 MG CAPS capsule TAKE 1 CAPSULE EVERY DAY   timolol (TIMOPTIC) 0.5 % ophthalmic solution    No facility-administered encounter medications on file as of 04/04/2022.    Patient Active Problem List   Diagnosis Date Noted   Pseudomonas urinary tract infection    Neurogenic bladder    Neck pain    Chronic pain syndrome    Transaminitis    Diffuse TBI w loss of consciousness of unsp duration, init ([place]) 05/21/2017   Tetraplegia ([place]) 05/21/2017   Fracture    Trauma    Polysubstance abuse (HCC)    Fever    Tachycardia    Post-operative pain    Leukocytosis    SIRS (systemic inflammatory response syndrome) (  [place])    Acute blood loss anemia    TBI (traumatic brain injury) ([place]) 05/03/2017   Polysubstance abuse ([place]) 01/12/2017    Conditions to be addressed/monitored:  Traumatic brain injury,Chronic Pain Syndrome,Neurogenic bladder,Dysuria,Chronic neck pain, Moderate episode of recurrent major depressive disorder)  Care Plan : RN Care Manager Plan of Care  Updates made by Lynne Logan, RN since 04/04/2022 12:00 AM  Completed 04/04/2022   Problem: No  plan of care established for management of chronic disease states (Traumatic brain injury,Chronic Pain Syndrome,Neurogenic bladder,Dysuria,Chronic neck pain, Moderate episode of recurrent major depressive disorder) Resolved 04/04/2022  Priority: High     Long-Range Goal: Establishment of plan of care for management of chronic disease states ((Traumatic brain injury,Chronic Pain Syndrome,Neurogenic bladder,Dysuria,Chronic neck pain, Moderate episode of recurrent major depressive disorder) Completed 04/04/2022  Start Date: 09/18/2021  Expected End Date: 09/18/2022  Recent Progress: On track  Priority: High  Note:   Current Barriers:  Knowledge Deficits related to plan of care for management of (Traumatic brain injury,Chronic Pain Syndrome,Neurogenic bladder,Dysuria,Chronic neck pain, Moderate episode of recurrent major depressive disorder)  Chronic Disease Management support and education needs related to (Traumatic brain injury,Chronic Pain Syndrome,Neurogenic bladder,Dysuria,Chronic neck pain, Moderate episode of recurrent major depressive disorder)   RNCM Clinical Goal(s):  Patient will verbalize basic understanding of  Traumatic brain injury,Chronic Pain Syndrome,Neurogenic bladder,Dysuria,Chronic neck pain, Moderate episode of recurrent major depressive disorder disease process and self health management plan as evidenced by patient/wife will report having no disease exacerbation  take all medications exactly as prescribed and will call provider for medication related questions as evidenced by patient/wife will report having no missed doses of patient's prescribed medications  demonstrate Improved health management independence as evidenced by patient/wife will report 100% adherence to his prescribed treatment plan  continue to work with RN Care Manager to address care management and care coordination needs related to  Traumatic brain injury,Chronic Pain Syndrome,Neurogenic bladder,Dysuria,Chronic  neck pain, Moderate episode of recurrent major depressive disorder as evidenced by adherence to CM Team Scheduled appointments through collaboration with RN Care manager, provider, and care team.   Interventions: 1:1 collaboration with primary care provider regarding development and update of comprehensive plan of care as evidenced by provider attestation and co-signature Inter-disciplinary care team collaboration (see longitudinal plan of care) Evaluation of current treatment plan related to  self management and patient's adherence to plan as established by provider Completed successful outbound call with wife [name]   Depression Interventions:  (Status:    unknown due to patient transitioned to [facility]  )  Long Term Goal Completed successful outbound call with wife and caregiver [name]  Evaluation of current treatment plan related to Depression, self-management and patient's adherence to plan as established by provider Discussed PCP referral to [facility] for Psychiatric care and counseling Determined patient feels satisfied with this provider for ongoing treatment, wife denies having questions or concerns at this time Discussed wife's concerns regarding patient may becoming anxious before leaving his home to attend outpatient PT, wife is requesting new referral (see other goal) Discussed plans with patient for ongoing care management follow up and provided patient with direct contact information for care management team  Impaired Physical Mobility Interventions:  (Status:    unknown due to patient transitioned to [facility]   )  Long Term Goal Completed successful inbound call with wife [name]  Evaluation of current treatment plan related to  Impaired Physical Mobility , self-management and patient's adherence to plan as  established by provider Determined wife is requesting assistance with coordination of care including in home PT and nurse aide Determined patient has missed  multiple appointments with PCP, wife reports having several barriers including difficulty getting patient to keep appointments, she also has other barriers related to her own health  Educated wife on Remote [facility] services and discussed patient may benefit from transitioning to this provider for primary care services Determined wife feels patient is more likely to receive the medical care he needs by transitioning to a provider who can make house calls and provide the additional services needed including in home PT and lab checks Collaborated with PCP [name] FNP regarding wife's request to have patient transition to [facility], Doreene Burke will forward the referral  Educated wife on referral process and next steps  Discussed plans with patient for ongoing care management follow up and provided patient with direct contact information for care management team Inbound call received from wife [name] Determined wife [name] has not received a call from [place] and she would like to follow up on the referral Placed outbound call to [place] [phone], spoke with Cecille Rubin RN regarding referral  Completed case collaboration with Cecille Rubin to determine patient's current medical needs and active problems Determined Cecille Rubin will contact [name] to schedule a home visit, Cecille Rubin will notify this RN once a determination is made if patient will be excepted by this provider   Diabetes Interventions:  (Status:   unknown due to patient transitioned to [facility]  ) Long Term Goal Completed successful inbound call with wife [name]  Determined patient's pain doctor checked his A1c during his in office visit Discussed per wife the patient's A1c has increased to 8.0 Assessed patient's understanding of A1c goal: <7% Provided education to patient about basic DM disease process Counseled on importance of regular laboratory monitoring as prescribed Discussed patient does not have a glucometer at this  time Sent message to PCP provider requesting an Rx be sent to patient's preferred pharmacy for a glucometer Mailed printed educational materials related to Diabetes Management  Lab Results  Component Value Date   HGBA1C 8.0 (H) 03/20/2022 (outside lab)  Patient Goals/Self-Care Activities: Take all medications as prescribed Attend all scheduled provider appointments Call pharmacy for medication refills 3-7 days in advance of running out of medications Call provider office for new concerns or questions  You should receive a call from [place] within the next 2 weeks to schedule your initial home visit  Please review the printed diabetes educational materials and let your health care team know if you have questions (materials will be mailed to your home) Your PCP will send in a new prescription for a glucometer, please use as directed and ask your pharmacist for help if needed   Follow Up Plan:  No further follow up required     Barb Merino, RN, BSN, CCM Care Management Coordinator [facility] [place] [facility]  Direct Phone: [phone]

## 2022-04-06 NOTE — Progress Notes (Signed)
This encounter was created in error - please disregard.

## 2022-04-11 ENCOUNTER — Telehealth: Payer: Self-pay | Admitting: Adult Health

## 2022-04-11 NOTE — Telephone Encounter (Signed)
Tried calling patient to schedule Medicare Annual Wellness Visit (AWV) either virtually or in office.  Left both my jabber number [phone] and office number    Last AWV ;12/29/20  please schedule at anytime with [facility]     No answer

## 2022-06-09 ENCOUNTER — Other Ambulatory Visit: Payer: Self-pay

## 2022-06-09 ENCOUNTER — Emergency Department (HOSPITAL_COMMUNITY)
Admission: EM | Admit: 2022-06-09 | Discharge: 2022-06-12 | Disposition: A | Discharge status: Home / Self Care | Payer: Medicare HMO | Attending: Emergency Medicine | Admitting: Emergency Medicine

## 2022-06-09 DIAGNOSIS — X838XXA Intentional self-harm by other specified means, initial encounter: Secondary | ICD-10-CM | POA: Insufficient documentation

## 2022-06-09 DIAGNOSIS — G8250 Quadriplegia, unspecified: Secondary | ICD-10-CM | POA: Insufficient documentation

## 2022-06-09 DIAGNOSIS — Z79899 Other long term (current) drug therapy: Secondary | ICD-10-CM | POA: Diagnosis not present

## 2022-06-09 DIAGNOSIS — F332 Major depressive disorder, recurrent severe without psychotic features: Secondary | ICD-10-CM | POA: Diagnosis not present

## 2022-06-09 DIAGNOSIS — Z7289 Other problems related to lifestyle: Secondary | ICD-10-CM

## 2022-06-09 DIAGNOSIS — T1491XA Suicide attempt, initial encounter: Secondary | ICD-10-CM | POA: Diagnosis not present

## 2022-06-09 DIAGNOSIS — F1910 Other psychoactive substance abuse, uncomplicated: Secondary | ICD-10-CM | POA: Diagnosis not present

## 2022-06-09 DIAGNOSIS — R45851 Suicidal ideations: Secondary | ICD-10-CM

## 2022-06-09 DIAGNOSIS — S0990XA Unspecified injury of head, initial encounter: Secondary | ICD-10-CM | POA: Diagnosis present

## 2022-06-09 DIAGNOSIS — Z20822 Contact with and (suspected) exposure to covid-19: Secondary | ICD-10-CM | POA: Insufficient documentation

## 2022-06-09 DIAGNOSIS — S069XAA Unspecified intracranial injury with loss of consciousness status unknown, initial encounter: Secondary | ICD-10-CM | POA: Diagnosis present

## 2022-06-09 LAB — CBC
HCT: 41.6 % (ref 39.0–52.0)
Hemoglobin: 13.1 g/dL (ref 13.0–17.0)
MCH: 24.3 pg — ABNORMAL LOW (ref 26.0–34.0)
MCHC: 31.5 g/dL (ref 30.0–36.0)
MCV: 77.3 fL — ABNORMAL LOW (ref 80.0–100.0)
Platelets: 240 10*3/uL (ref 150–400)
RBC: 5.38 MIL/uL (ref 4.22–5.81)
RDW: 19.5 % — ABNORMAL HIGH (ref 11.5–15.5)
WBC: 9.1 10*3/uL (ref 4.0–10.5)
nRBC: 0 % (ref 0.0–0.2)

## 2022-06-09 LAB — COMPREHENSIVE METABOLIC PANEL
ALT: 24 U/L (ref 0–44)
AST: 22 U/L (ref 15–41)
Albumin: 4.1 g/dL (ref 3.5–5.0)
Alkaline Phosphatase: 105 U/L (ref 38–126)
Anion gap: 10 (ref 5–15)
BUN: 18 mg/dL (ref 6–20)
CO2: 23 mmol/L (ref 22–32)
Calcium: 9.2 mg/dL (ref 8.9–10.3)
Chloride: 102 mmol/L (ref 98–111)
Creatinine, Ser: 0.94 mg/dL (ref 0.61–1.24)
GFR, Estimated: >60 mL/min (ref >60)
Glucose, Bld: 97 mg/dL (ref 70–99)
Potassium: 3.5 mmol/L (ref 3.5–5.1)
Sodium: 135 mmol/L (ref 135–145)
Total Bilirubin: 0.7 mg/dL (ref 0.3–1.2)
Total Protein: 8.3 g/dL — ABNORMAL HIGH (ref 6.5–8.1)

## 2022-06-09 LAB — SALICYLATE LEVEL: Salicylate Lvl: <7 mg/dL — ABNORMAL LOW (ref 7.0–30.0)

## 2022-06-09 LAB — ETHANOL: Alcohol, Ethyl (B): <10 mg/dL (ref <10)

## 2022-06-09 LAB — RAPID URINE DRUG SCREEN, HOSP PERFORMED
Amphetamines: NOT DETECTED
Barbiturates: NOT DETECTED
Benzodiazepines: NOT DETECTED
Cocaine: POSITIVE — AB
Opiates: NOT DETECTED
Tetrahydrocannabinol: POSITIVE — AB

## 2022-06-09 LAB — RESP PANEL BY RT-PCR (FLU A&B, COVID) ARPGX2
Influenza A by PCR: NEGATIVE
Influenza B by PCR: NEGATIVE
SARS Coronavirus 2 by RT PCR: NEGATIVE

## 2022-06-09 LAB — ACETAMINOPHEN LEVEL: Acetaminophen (Tylenol), Serum: <10 ug/mL — ABNORMAL LOW (ref 10–30)

## 2022-06-09 MED ORDER — ACETAMINOPHEN 500 MG PO TABS
1000.0000 mg | ORAL_TABLET | Freq: Once | ORAL | Status: AC
  Administered 2022-06-09: 1000 mg via ORAL
  Filled 2022-06-09: qty 2

## 2022-06-09 MED ORDER — SERTRALINE HCL 50 MG PO TABS
75.0000 mg | ORAL_TABLET | Freq: Every day | ORAL | Status: DC
  Administered 2022-06-09 – 2022-06-12 (×4): 75 mg via ORAL
  Filled 2022-06-09 (×4): qty 2

## 2022-06-09 MED ORDER — METHOCARBAMOL 500 MG PO TABS
500.0000 mg | ORAL_TABLET | Freq: Three times a day (TID) | ORAL | Status: DC | PRN
  Administered 2022-06-09 – 2022-06-11 (×2): 500 mg via ORAL
  Filled 2022-06-09 (×2): qty 1

## 2022-06-09 MED ORDER — FUROSEMIDE 20 MG PO TABS
20.0000 mg | ORAL_TABLET | Freq: Every day | ORAL | Status: DC
  Administered 2022-06-09 – 2022-06-12 (×4): 20 mg via ORAL
  Filled 2022-06-09 (×4): qty 1

## 2022-06-09 MED ORDER — TAMSULOSIN HCL 0.4 MG PO CAPS
0.4000 mg | ORAL_CAPSULE | Freq: Every day | ORAL | Status: DC
  Administered 2022-06-09 – 2022-06-12 (×4): 0.4 mg via ORAL
  Filled 2022-06-09 (×4): qty 1

## 2022-06-09 MED ORDER — GABAPENTIN 300 MG PO CAPS
600.0000 mg | ORAL_CAPSULE | Freq: Three times a day (TID) | ORAL | Status: DC
  Administered 2022-06-09 – 2022-06-12 (×9): 600 mg via ORAL
  Filled 2022-06-09 (×9): qty 2
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
## 2022-06-09 NOTE — ED Notes (Signed)
Pt asleep at this time.  Will move pt as soon as he is awake or if room needed.

## 2022-06-09 NOTE — ED Notes (Addendum)
Pt personal belongings collected; pt dressed in burgundy scrubs. Items include hat, jacket, cloth collar, phone, apple watch, bracelet, two necklaces, ring, shorts. Necklaces and ring separated; apple watch separated and placed in labeled specimen cups (two cups total). Cups and other personal items listed all placed together in one belongings bag; placed in triage cabinet. Pt has possession of personal denture container for denture storage. [employer]

## 2022-06-09 NOTE — [facility] Notes (Signed)
Per Quintella Reichert, NP, patient meets criteria for inpatient treatment. There are no available beds at [facility] today. CSW faxed referrals to the following facilities for review:  [address] [place] [address] [phone] --  [place] N/A [address] [place] [address] [phone] --  [address] [place] [address] [phone] --  [place] N/A [place], [place] [address] [phone] --  [place] [address] [place], [place] [address] [phone] --  [place] [address] [place] [address] [phone] --  [facility]  Pending - [place] N/A [address] [place] [address] [place] [address] [place] [address] [phone] --  [facility]  Pending - [place] N/A [address] [place] [address] [phone] --  [facility]  Pending - [place] Sent N/A [address], [place] [address] [phone] --  [facility]  Pending - Request Sent N/A [place], [place] [address] [phone] --  [facility]  Pending - [place] Sent N/A [address] [place] [address] [phone] --  [place] N/A [address] [place] [address] [phone] --   TTS will continue to seek bed placement.  [place]ie [place], MSW, [facility] [place] Phone: [phone] Disposition/TOC

## 2022-06-10 MED ORDER — ACETAMINOPHEN 325 MG PO TABS
650.0000 mg | ORAL_TABLET | Freq: Four times a day (QID) | ORAL | Status: DC | PRN
  Administered 2022-06-10 – 2022-06-11 (×3): 650 mg via ORAL
  Filled 2022-06-10 (×3): qty 2

## 2022-06-10 MED ORDER — DIVALPROEX SODIUM ER 250 MG PO TB24
250.0000 mg | ORAL_TABLET | Freq: Every day | ORAL | Status: DC
  Administered 2022-06-10 – 2022-06-11 (×2): 250 mg via ORAL
  Filled 2022-06-10 (×2): qty 1

## 2022-06-10 MED ORDER — IBUPROFEN 200 MG PO TABS
600.0000 mg | ORAL_TABLET | Freq: Once | ORAL | Status: AC
  Administered 2022-06-10: 600 mg via ORAL
  Filled 2022-06-10: qty 3
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
## 2022-06-10 NOTE — ED Notes (Signed)
Pt was given a sprite, 3 peanut butters, and 3 graham crackers.

## 2022-06-10 NOTE — ED Notes (Signed)
Pt upset he is not getting anything to else to eat at this time.  Pt was given peanut butter and crackers with night meds.  Informed pt at that time that he would not get anything else to eat due to the specifications of the unit.  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  Per report pt ate dinner and was given snacks prior earlier in the evening.  Pt up to sit in a chair by grabbing at door/wall/bed, refusing to get into bed.  Pt states, "I'm not doing anything until you give me something to eat, I'll yell until then, you need to get your superior and I'm not going to stop until I talk to someone else."  Pt to bathroom by grabbing wall and scooting chair. Pt refusing help, yelling at staff.  Charge RN informed.

## 2022-06-10 NOTE — ED Notes (Addendum)
Pt had a phone call with his wife. Pt was informed that phone have to be before 9pm.

## 2022-06-10 NOTE — ED Provider Notes (Signed)
  Physical Exam  BP 127/86 (BP Location: Right Arm)   Pulse 98   Temp (!) 97.4 F (36.3 C) (Oral)   Resp 18   SpO2 94%   Physical Exam  Procedures  Procedures  ED Course / MDM   Clinical Course as of 06/10/22 1420  Sat Jun 09, 2022  0434 TTS completed. Quintella Reichert, NP recommends inpatient psychiatric treatment. Appropriate facilities will be contacted for placement. [KH]    Clinical Course User Index [KH] Antonietta Breach, PA-C   Medical Decision Making Amount and/or Complexity of Data Reviewed Labs: ordered.  Risk Prescription drug management.   Patient pending inpatient treatment.  Has had adjustment of medications.  Reportedly agitated about some food issues.       Davonna Belling, MD 06/10/22 1422

## 2022-06-11 MED ORDER — TIMOLOL MALEATE 0.5 % OP SOLN
1.0000 [drp] | Freq: Two times a day (BID) | OPHTHALMIC | Status: DC
  Administered 2022-06-12: 1 [drp] via OPHTHALMIC
  Filled 2022-06-11: qty 5

## 2022-06-11 MED ORDER — LATANOPROST 0.005 % OP SOLN
1.0000 [drp] | Freq: Every day | OPHTHALMIC | Status: DC
  Administered 2022-06-11: 1 [drp] via OPHTHALMIC
  Filled 2022-06-11: qty 2.5

## 2022-06-11 NOTE — Progress Notes (Signed)
CSW requested that pt be reviewed by Day shift [place], RN at 10:49am. And at 8:40pm CSW requested that night [facility] AC Wynonia Hazard, RN to review pt. CSW will assist and follow up.   Benjaman Kindler, MSW, [facility] 06/11/2022 9:58 PM
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
## 2022-06-11 NOTE — ED Notes (Addendum)
Pt alert and calm throughout duration of shift. No aggression, confusion, agitation noted. Pt adhered to medication treatments.

## 2022-06-11 NOTE — ED Notes (Signed)
Patient alert this shift. Patient medication compliant. Guarded. Patient denied suicidal ideation at this time, states he "he just ready to go home".  Patient denied homicidal ideation at this time.  Poor eye contact. Cooperative with care.

## 2022-06-11 NOTE — ED Provider Notes (Signed)
Emergency Medicine Observation Re-evaluation Note  Mickey Esguerra Sr. is a 56 y.o. male, seen on rounds today.  Pt initially presented to the ED for complaints of Suicide Attempt Currently, the patient is lying in bed  Physical Exam  BP 132/73   Pulse 95   Temp 97.6 F (36.4 C) (Oral)   Resp 18   SpO2 95%  Physical Exam General: Awake, alert, no physical complaints Cardiac: Extremities well-perfused Lungs: Breathing is unlabored Psych: No agitation  ED Course / MDM  EKG:EKG Interpretation  Date/Time:  Saturday June 09 2022 20:31:47 EDT Ventricular Rate:  80 PR Interval:  200 QRS Duration: 76 QT Interval:  366 QTC Calculation: 422 R Axis:   41 Text Interpretation: Normal sinus rhythm with sinus arrhythmia T wave abnormality, consider inferior ischemia Abnormal ECG When compared with ECG of 25-Jun-2018 15:46, T wave abnormality is slightly more prominent Confirmed by Delora Fuel ([id number]) on 06/11/2022 4:06:52 AM  I have reviewed the labs performed to date as well as medications administered while in observation.  Recent changes in the last 24 hours include evaluation by TTS with recommendation for inpatient psychiatric admission.  Plan  Current plan is for inpatient psychiatric hospitalization.    Godfrey Pick, MD 06/11/22 [phone]

## 2022-06-12 DIAGNOSIS — T1491XA Suicide attempt, initial encounter: Secondary | ICD-10-CM

## 2022-06-12 MED ORDER — GABAPENTIN 300 MG PO CAPS: 600.0000 mg | ORAL_CAPSULE | Freq: Three times a day (TID) | ORAL | 0 refills | Status: DC

## 2022-06-12 MED ORDER — METHOCARBAMOL 500 MG PO TABS: 500.0000 mg | ORAL_TABLET | Freq: Three times a day (TID) | ORAL | 0 refills | Status: AC | PRN

## 2022-06-12 MED ORDER — DIVALPROEX SODIUM ER 250 MG PO TB24: 250.0000 mg | ORAL_TABLET | Freq: Every day | ORAL | 0 refills | Status: AC

## 2022-06-12 NOTE — Discharge Instructions (Addendum)
Mental Health Resources National Suicide Prevention Lifeline 1-800-273-TALK (8255) http://www.suicidepreventionlifeline.org/ "988"-Mental Health Crisis Line  National Schizophrenia Foundation https://sczaction.org/ National Institute of Mental Health 1-866-615-6464 nimhinfo@nih.gov (e-mail) www.nimh.nih.gov Schizophrenia & Psychosis Action Alliance 800-493-2094 info@sczaction.org https://sczaction.org/  5 Additional Healthline identified "Best online Schizophrenia Support Groups" Students with Psychosis  Schizophrenia Spectrum Support  Supportiv ($30 monthly fee)   NAMI Connection Recovery Support Group  Schizophrenia Alliance  Local Resources: Outpatient:   [facility]:  [phone] [address]   [place], [place]  [address]     https://www.[place].gov/services/[place]-[facility]al-health-centers#contact   [place]  [facility] [place] [address] [place], [place] [address] [phone]  [place]  [facility] [place] [address] [place], [place]  [address] [phone] Local Resources (Inpatient)  [facility] [address]  [place], [place] [address] [phone]  [facility] Behavioral Medicine Unit and Geriatric Psychiatric Unit   [address]  [place], [place] [address] [phone]   NAMI -[facility] [place] https://naminwpiedmontnc.org/support-and-education/mental-health-education/  NAMI -[place] https://namigu[place]ford.org/  Community Housing and Support Resources:  [facility] Homelessness https://pehgc.org/  [facility] Center https://www.interactiveresourcecenter.org/  [place] [facility] https://www.greensborourbanministry.org/  [facility] [place] (adult men's shelter) www.opendoorministrieshp.org [address] [place], [place] [address] [phone] [facility] [place] [facility] [address] [place], [place] [address] [phone]   VA's National Homeless Call Center 1-877-4AID VET (1-877-424-3838)  Veterans Crisis Line 1-800-273-8255 press 1 Confidential chat-VeteransCrisisLine.net Or Text to 838255  United Way Call 211 or 1-888-892-1162 www.NC211.org  Affordable Housing Resources in [place] nchousingsearch.com   1-877-428-8844  Shelters  [place] Housing Coalition Housing Hotline 336-691-9521 8:30am-5:30pm [facility] - Vet Safety Net [address] [place], [place]  [address] [phone] Male veterans 18+ with substance abuse issues Eligibility:  By Referral Only  [facility] [address] [place], [place] [address] [phone] Ext. 347 Adult Men & Women Eligibility: Valid ID & Social Security Card www.greensborourbanministry.org  [facility] [address] [place], [place]  [address] [phone] Male veterans 18+ with substance abuse issues Eligibility:  By Referral Only  [facility] [address] [place], [place]  [address] [phone] Single women 18+ without dependents Open 6pm-8am Eligibility:  Valid ID & Social Security Card Call to check availability  http://westendministries.org/leslieshouse.aspx  [facility] [address] [place], [place]  [address] [phone] Male veterans 18+ with substance abuse/mental health issues Eligibility:  By Referral Only  [facility] [address] [place], [place] [address] [phone] Call to check availability Males 18+ Eligibility: Valid ID & Social Security Card www.odm-hp.org  [facility] [place] [address] [place], [place] [address] [phone] Women 18+ & Families with children Eligibility:  Valid ID & Criminal  Background Check www.[facility]vation[facility].org/commands/[place]   Community Care of  (Care Management): [phone] [facility] [place] [facility]: Behavioral Health 24 Hour Phone Line 1-800-853-5163 NAMI Hotline 336-370-4264 NAMI [place] 919-788-0801  2-1-1 Referral Service United Way 211 Call 211 or 1-888-892-1162 www.NC211.org A free United Way, 24/7 telephone information and referral service to help link citizens who are seeking help with the community resources they need. In [place], [place],  and [place] counties, just dial 211 from your phone. Mental Health Association in [place] 336-373-1402 Support groups for anxiety, depression and bipolar disorder, schizophrenia,   family and friends, aftermath of suicide, and mental wellness for Latinos (in Spanish). Mental Health Association in [place] [phone] Offers support groups, [facility] program offers. Psychological, vocational, educational and other rehabilitation services to those who suffer from mental health illness of the 18 and up (5 days a week/5hours a day), offer out patient services like diagnostic evaluation, comprehensive clinical assessments, individual counseling, group therapy, psycho-educational workshops, referral to other specialists, referral to a psychiatrist for an evaluation for medication, consultation, and outreach/training. [facility]S (Alcohol and Drug Services) (336) [phone] Substance Abuse education, prevention and treatment (detox, assessments, intensive outpatient and inpatient counseling and programs). [facility] [place] (336) 370-0195, [place] (336) [phone] Support groups for posttraumatic stress, depression, and schizophrenia? as well as day programs for individuals with severe mental illness. [facility] [place] ([phone]) [phone] [facility]-FREE-336-275-7896 [facility] [phone] or www.kellinfoundation.org Telehealth platform, Individual counseling across the  lifespan for both mental health and substance use, support groups, advocacy, case management, virtual villages, resource coordination. [facility]- 1-800-256-2452 [facility]-FREE-336-676-6840 or 1-800-853-5163 336-676-6849. Provides mental health services to all residents regardless of ability to pay. [address], [place]. [place]. 24 Domestic Violence Crisis Line [facility] Call for shelter and/or safety planning [facility] [phone] (24/7) [facility] [phone] (24/7)  VA Homeless Hotline 877-424-3838  Veterans Crisis Line 1-800-273-8255 press 1 Confidential chat-VeteransCrisisLine.net Or Text to 838255  [facility] [place] [facility] [address] [place], [place] [address] [phone] Hours: Mon-Fri. 8am-5pm [facility] Mobile Crisis Management Mobile crisis response for mental health, substance abuse or intellectual/developmental disabilities 1-877-626-1772     Disclaimer: This resource list is subject to change at any time and is a starting point for resource identification as of 09/08/2021.   
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
## 2022-06-12 NOTE — ED Provider Notes (Addendum)
Emergency Medicine Observation Re-evaluation Note  Vincent Perz Sr. is a 56 y.o. male, seen on rounds today.  Pt initially presented to the ED for complaints of Suicide Attempt Currently, the patient is resting in bed.  Physical Exam  BP (!) 173/91 (BP Location: Right Arm)   Pulse 81   Temp 98.4 F (36.9 C) (Oral)   Resp 18   SpO2 92%  Physical Exam General: no distress Cardiac: well perfused Lungs: even and unlabored Psych: no agitation  ED Course / MDM  EKG:EKG Interpretation  Date/Time:  Saturday June 09 2022 20:31:47 EDT Ventricular Rate:  80 PR Interval:  200 QRS Duration: 76 QT Interval:  366 QTC Calculation: 422 R Axis:   41 Text Interpretation: Normal sinus rhythm with sinus arrhythmia T wave abnormality, consider inferior ischemia Abnormal ECG When compared with ECG of 25-Jun-2018 15:46, T wave abnormality is slightly more prominent Confirmed by Delora Fuel ([id number]) on 06/11/2022 4:06:52 AM  I have reviewed the labs performed to date as well as medications administered while in observation.  Recent changes in the last 24 hours include pt is no longer endorsing SI. Had been evaluated by psychiatry yesterday and had been deemed to meet criteria for inpatient.  Plan  Current plan is for Re-engage psychiatry to determine continued need for inpatient admission.  1008 Pt re-evaluated by psychiatry, cleared for discharge at this time. Outpatient resources provided for the patient.    Regan Lemming, MD 06/12/22 4782    Regan Lemming, MD 06/12/22 1008

## 2022-06-12 NOTE — Consult Note (Signed)
Brief Psychiatry Consult Note  This pt is being followed by TTS service as he is in ED; they plan to see today. Inpt psychiatry consult service to sign off. Will dc order to inpt psych.    We will sign off at this time. This has been communicated to the primary team. If issues arise in the future, don't hesitate to reconsult the Psychiatry Inpatient Consult Service.   [name] [name]

## 2022-06-12 NOTE — Progress Notes (Signed)
Transition of Care [facility]) - Emergency Department Mini Assessment   Patient Details  Name: Vincent Matassa Sr. MRN: [id number] Date of Birth: 1966-07-03  Transition of [facility]) CM/SW Contact:    Kimber Relic, LCSW Phone Number: 06/12/2022, 11:44 AM   Clinical Narrative: TOC consulted to provide pt with resources for outpatient behavioral health. This CSW has scheduled the pt a follow up appointment with [facility] Medicine. This CSW also attached resources to [facility] and to [facility] Partial Hospitalization for Adults program. TOC signing off.    ED Mini Assessment: What brought you to the Emergency Department? : SI.  Barriers to Discharge: No Barriers Identified     Means of departure: Not know       Patient Contact and Communications        ,                 Admission diagnosis:  suicide attempt Patient Active Problem List   Diagnosis Date Noted   Suicidal behavior with attempted self-injury ([place]) 06/09/2022   Pseudomonas urinary tract infection    Neurogenic bladder    Neck pain    Chronic pain syndrome    Transaminitis    Diffuse TBI w loss of consciousness of unsp duration, init ([place]) 05/21/2017   Tetraplegia ([place]) 05/21/2017   Fracture    Trauma    Polysubstance abuse ([place])    Fever    Tachycardia    Post-operative pain    Leukocytosis    SIRS (systemic inflammatory response syndrome) ([place])    Acute blood loss anemia    TBI (traumatic brain injury) ([place]) 05/03/2017   Polysubstance abuse ([place]) 01/12/2017   PCP:  [facility] Pharmacy:   [facility] [place], [place] [facility] [address] [place] [address] Phone: [phone] Fax: [phone]

## 2022-06-12 NOTE — Progress Notes (Signed)
Inpatient Behavioral Health Placement   Pt meets inpatient criteria per Delfin Gant, NP. There are no available beds at [facility] per [facility]. Referral was sent to the following facilities;   Destination Service Provider Address Phone Fax  [facility]  [address], [place] [address] [phone]  [place] [address] [place] [address] [phone]  [facility]  [address] [place] [address] [phone] [place]  [place], [place] [address] [phone]  [facility]  [address] [place], [place] [address] [phone] [place] [place] [address] [place]  [facility]  [address] [place] [address] [phone]  [facility]  [address] [place] [address] [phone]  [facility]  [address] [place] [address] [phone] [place]  [address] [place] [address] [phone] [facility]  [address] [place] [address] [phone]  [facility]  [address] [place] [address] [phone] [facility]  [address] [place] [address] [phone] [place], [place] [address] [phone] [facility]  [place], [place] [address] [phone]  [facility]  [place] [address], [place] [address] [phone]  [facility]  [address], [place] [address] [phone]  [facility]  [place], [place] [address] [phone] [facility]  [address], [place] [address] [phone] [place] [place], [place] [address] [phone]    Situation ongoing,  CSW will follow up.   Benjaman Kindler, MSW, LCSWA 06/12/2022  @ 8:07 AM

## 2022-06-25 NOTE — Progress Notes (Deleted)
CC: new patient visit  HPI:  [name] is a 56 y.o. male with past medical history of HTN, HLD, T2DM, TBI, tetraplegia, chronic pain syndrome, neurogenic bladder, polysubstance abuse, depression, and suicidal behavior that presents for a new patient visit.    Past medical history includes: Patient denies past medical history of HTN, DM, CHF, MI, DVT, stroke, or cancer.   Family history includes: Patient denies family history of HTN, DM, CHF, MI, DVT, stroke, or cancer.   Past surgical history: anterior cervical discectomy w/ fusion  Past social history: Patient lives at home with ***. Patient works as a ***. Patient denies current or prior tobacco use, alcohol use, or recreational drug use.   Medications:  Allergies as of 06/26/2022   No Known Allergies      Medication List        Accurate as of June 25, 2022 10:09 PM. If you have any questions, ask your nurse or doctor.          acetaminophen 650 MG CR tablet Commonly known as: TYLENOL Take 1,300 mg by mouth every 8 (eight) hours as needed for pain.   atorvastatin 40 MG tablet Commonly known as: LIPITOR Take 40 mg by mouth at bedtime.   diclofenac sodium 1 % Gel Commonly known as: VOLTAREN Apply 2 g 4 (four) times daily topically. What changed:  when to take this reasons to take this   divalproex 250 MG 24 hr tablet Commonly known as: DEPAKOTE ER Take 1 tablet (250 mg total) by mouth at bedtime.   fesoterodine 4 MG Tb24 tablet Commonly known as: TOVIAZ Take 4 mg by mouth daily.   furosemide 20 MG tablet Commonly known as: LASIX TAKE 1 TABLET BY MOUTH EVERY DAY What changed:  when to take this reasons to take this   gabapentin 300 MG capsule Commonly known as: NEURONTIN Take 2 capsules (600 mg total) by mouth 3 (three) times daily.   latanoprost 0.005 % ophthalmic solution Commonly known as: XALATAN Place 1 drop into both eyes at bedtime.   lidocaine 4 % Place 1 patch  onto the skin daily as needed (pain).   methocarbamol 500 MG tablet Commonly known as: ROBAXIN Take 1 tablet (500 mg total) by mouth every 8 (eight) hours as needed for muscle spasms.   mirtazapine 30 MG tablet Commonly known as: REMERON Take 30 mg by mouth at bedtime.   oxyCODONE-acetaminophen 7.5-325 MG tablet Commonly known as: PERCOCET Take 1 tablet by mouth every 8 (eight) hours as needed for pain.   Ozempic (0.25 or 0.5 MG/DOSE) 2 MG/3ML Sopn Generic drug: Semaglutide(0.25 or 0.'5MG'$ /DOS) Inject 0.5 mg into the skin once a week. Sunday   sertraline 50 MG tablet Commonly known as: ZOLOFT Take 75 mg by mouth daily.   tamsulosin 0.4 MG Caps capsule Commonly known as: FLOMAX TAKE 1 CAPSULE EVERY DAY   timolol 0.5 % ophthalmic solution Commonly known as: TIMOPTIC Place 1 drop into both eyes 2 (two) times daily.   venlafaxine XR 150 MG 24 hr capsule Commonly known as: EFFEXOR-XR Take 300 mg by mouth at bedtime.         Past Medical History:  Diagnosis Date   Brain injury    Chronic back pain    History of suicidal ideation 01/2017   with overdose/ [place] admission   Lumbago with sciatica    left side   Vitamin D deficiency    Review of Systems:  per HPI.   Physical  Exam: *** There were no vitals filed for this visit.  *** Constitutional: Well-developed, well-nourished, appears comfortable  HENT: Normocephalic and atraumatic.  Eyes: EOM are normal. PERRL.  Neck: Normal range of motion.  Cardiovascular: Regular rate, regular rhythm. No murmurs, rubs, or gallops. Normal radial and PT pulses bilaterally. No LE edema.  Pulmonary: Normal respiratory effort. No wheezes, rales, or rhonchi.   Abdominal: Soft. Non-distended. No tenderness. Normal bowel sounds.  Musculoskeletal: Normal range of motion.     Neurological: Alert and oriented to person, place, and time. Non-focal. Skin: warm and dry.    Assessment & Plan:   ?: ***  Depression/Suicidal behavior -  Current medications include divalproex 250 MG, mirtazapine 30 MG, sertraline 50 MG, and venlafaxine XR 150 MG (2X at bedtime). Patient is *** currently following up with psychiatry. Patient denies changes to his sleep, decreased interest in hobbies, increased feelings of guilt, changes to his energy level, difficulty concentrating, or changes in his activity level. He denies SI or HI.   Plan: - Continue divalproex 250 MG, mirtazapine 30 MG, sertraline 50 MG, and venlafaxine XR 150 MG (2X at bedtime) - F/u w/ psychiatry  2. T2DM - Current medications include Ozempic 0.5 mg weekly and gabapentin 300 MG (2X TID). Patient states that he is *** compliant with these medications. Patient does *** check his blood sugar at home regularly and notes values ***. Patient denies polyuria, polydipsia, fatigue. A1c was 8.0 in 03/2022. A1c today ***. Patient states that he does *** visit the ophthalmologist for yearly eye exams.   Plan: -  3. HTN - Patient is not currently taking any medications for this condition. Patient states that he does *** check his BP regularly at home. Patient denies HA, lightheadedness, dizziness, CP, or SOB. Initial BP today is ***. Repeat BP is ***.    Plan: -  3.  TBI, tetraplegia, chronic pain syndrome, neurogenic bladder - Recent note from care management discusses plans to transition patient to [facility] due missed doctor appointments and barriers leaving his house.   Plan: -  4. Health Screening: - Colonoscopy (),  - Medication refill?    See Encounters Tab for problem based charting.  Patient seen with Dr. Barbaraann Boys

## 2022-06-26 ENCOUNTER — Ambulatory Visit: Payer: Medicare HMO

## 2022-09-27 ENCOUNTER — Other Ambulatory Visit: Payer: Self-pay

## 2022-09-27 ENCOUNTER — Emergency Department (HOSPITAL_COMMUNITY)
Admission: EM | Admit: 2022-09-27 | Discharge: 2022-09-27 | Discharge status: Against Medical Advice | Payer: Medicare HMO | Attending: Emergency Medicine | Admitting: Emergency Medicine

## 2022-09-27 DIAGNOSIS — R059 Cough, unspecified: Secondary | ICD-10-CM | POA: Diagnosis not present

## 2022-09-27 DIAGNOSIS — Z5321 Procedure and treatment not carried out due to patient leaving prior to being seen by health care provider: Secondary | ICD-10-CM | POA: Diagnosis not present

## 2022-09-27 LAB — COMPREHENSIVE METABOLIC PANEL
ALT: 12 U/L (ref 0–44)
AST: 14 U/L — ABNORMAL LOW (ref 15–41)
Albumin: 3.4 g/dL — ABNORMAL LOW (ref 3.5–5.0)
Alkaline Phosphatase: 88 U/L (ref 38–126)
Anion gap: 9 (ref 5–15)
BUN: <5 mg/dL — ABNORMAL LOW (ref 6–20)
CO2: 25 mmol/L (ref 22–32)
Calcium: 9.5 mg/dL (ref 8.9–10.3)
Chloride: 104 mmol/L (ref 98–111)
Creatinine, Ser: 0.91 mg/dL (ref 0.61–1.24)
GFR, Estimated: >60 mL/min (ref >60)
Glucose, Bld: 98 mg/dL (ref 70–99)
Potassium: 4.4 mmol/L (ref 3.5–5.1)
Sodium: 138 mmol/L (ref 135–145)
Total Bilirubin: 0.3 mg/dL (ref 0.3–1.2)
Total Protein: 7.4 g/dL (ref 6.5–8.1)

## 2022-09-27 LAB — CBC
HCT: 43.2 % (ref 39.0–52.0)
Hemoglobin: 14 g/dL (ref 13.0–17.0)
MCH: 25.2 pg — ABNORMAL LOW (ref 26.0–34.0)
MCHC: 32.4 g/dL (ref 30.0–36.0)
MCV: 77.7 fL — ABNORMAL LOW (ref 80.0–100.0)
Platelets: 264 10*3/uL (ref 150–400)
RBC: 5.56 MIL/uL (ref 4.22–5.81)
RDW: 18.6 % — ABNORMAL HIGH (ref 11.5–15.5)
WBC: 8.1 10*3/uL (ref 4.0–10.5)
nRBC: 0 % (ref 0.0–0.2)

## 2022-09-27 LAB — LIPASE, BLOOD: Lipase: 37 U/L (ref 11–51)

## 2022-09-27 MED ORDER — OXYCODONE-ACETAMINOPHEN 5-325 MG PO TABS
1.0000 | ORAL_TABLET | Freq: Once | ORAL | Status: AC
  Administered 2022-09-27: 1 via ORAL
  Filled 2022-09-27: qty 1

## 2022-09-27 NOTE — ED Triage Notes (Signed)
Patient reports right upper groin pain onset yesterday morning worse when coughing , denies injury .

## 2022-09-27 NOTE — ED Notes (Signed)
Pt leaving to go to the urology  clinic

## 2022-09-27 NOTE — ED Provider Triage Note (Signed)
Emergency Medicine Provider Triage Evaluation Note  Vincent Broaddus Sr. , a 57 y.o. male  was evaluated in triage.  Pt complains of right-sided groin pain that began after he forcefully defecated earlier he states he also has had some pain when coughing no blood in his stool.  Has had several nonbloody bowel movements this morning.  No nausea or vomiting no fevers. No lightheadedness or dizziness  Review of Systems  Positive: Right groin pain Negative: Fever  Physical Exam  BP (!) 155/110   Pulse 96   Temp 98.4 F (36.9 C)   Resp 17   SpO2 95%  Gen:   Awake, no distress Resp:  Normal effort  MSK:   Moves extremities without difficulty  Other:  Abd soft NTTP  Medical Decision Making  Medically screening exam initiated at 4:16 AM.  Appropriate orders placed.  [place] was informed that the remainder of the evaluation will be completed by another provider, this initial triage assessment does not replace that evaluation, and the importance of remaining in the ED until their evaluation is complete.  Labs   Extremely difficult to examine this patient given body habitus, positioning, his inability to get his pants lowered, he is wearing jeans, he has poor mobility seemingly at baseline.  His symptoms are certainly consistent with hernia.  Will defer further examination workup to major care.   [name] [place], [place] 09/27/22 [phone]

## 2023-01-07 ENCOUNTER — Emergency Department (HOSPITAL_BASED_OUTPATIENT_CLINIC_OR_DEPARTMENT_OTHER): Payer: Medicare HMO

## 2023-01-07 ENCOUNTER — Encounter (HOSPITAL_BASED_OUTPATIENT_CLINIC_OR_DEPARTMENT_OTHER): Payer: Self-pay | Admitting: Emergency Medicine

## 2023-01-07 ENCOUNTER — Other Ambulatory Visit: Payer: Self-pay

## 2023-01-07 ENCOUNTER — Emergency Department (HOSPITAL_BASED_OUTPATIENT_CLINIC_OR_DEPARTMENT_OTHER)
Admission: EM | Admit: 2023-01-07 | Discharge: 2023-01-07 | Disposition: A | Discharge status: Home / Self Care | Payer: Medicare HMO | Attending: Emergency Medicine | Admitting: Emergency Medicine

## 2023-01-07 DIAGNOSIS — M542 Cervicalgia: Secondary | ICD-10-CM | POA: Insufficient documentation

## 2023-01-07 DIAGNOSIS — W010XXA Fall on same level from slipping, tripping and stumbling without subsequent striking against object, initial encounter: Secondary | ICD-10-CM | POA: Insufficient documentation

## 2023-01-07 DIAGNOSIS — W19XXXA Unspecified fall, initial encounter: Secondary | ICD-10-CM

## 2023-01-07 DIAGNOSIS — Y9301 Activity, walking, marching and hiking: Secondary | ICD-10-CM | POA: Diagnosis not present

## 2023-01-07 DIAGNOSIS — R519 Headache, unspecified: Secondary | ICD-10-CM | POA: Insufficient documentation

## 2023-01-07 DIAGNOSIS — Y92480 Sidewalk as the place of occurrence of the external cause: Secondary | ICD-10-CM | POA: Insufficient documentation

## 2023-01-07 MED ORDER — KETOROLAC TROMETHAMINE 15 MG/ML IJ SOLN
15.0000 mg | Freq: Once | INTRAMUSCULAR | Status: AC
  Administered 2023-01-07: 15 mg via INTRAVENOUS
  Filled 2023-01-07: qty 1

## 2023-01-07 MED ORDER — DIPHENHYDRAMINE HCL 50 MG/ML IJ SOLN
12.5000 mg | Freq: Once | INTRAMUSCULAR | Status: AC
  Administered 2023-01-07: 12.5 mg via INTRAVENOUS
  Filled 2023-01-07: qty 1

## 2023-01-07 MED ORDER — SODIUM CHLORIDE 0.9 % IV BOLUS
1000.0000 mL | Freq: Once | INTRAVENOUS | Status: AC
  Administered 2023-01-07: 1000 mL via INTRAVENOUS

## 2023-01-07 MED ORDER — PROCHLORPERAZINE EDISYLATE 10 MG/2ML IJ SOLN
10.0000 mg | Freq: Once | INTRAMUSCULAR | Status: AC
  Administered 2023-01-07: 10 mg via INTRAVENOUS
  Filled 2023-01-07: qty 2
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
## 2023-01-07 NOTE — Discharge Instructions (Signed)
Evaluation today was overall reassuring.  There was concern on the CT scan for a possible meningioma.  I spoke to one of our neurologist, Dr. Otelia Limes who advised that you follow-up with [facility] neurology and plan for further evaluation with MRI of the brain and orbits.  If you have worsening headache, facial droop, slurred speech, weakness or numbness in your extremities or any other concerning symptom please return emerged part for evaluation.

## 2023-01-07 NOTE — ED Triage Notes (Signed)
Fell 2 days backward , occipital headache , neck pain , Hx cervical surgery 2018. Alert and oriented x 4 . No blood thinners

## 2023-01-26 ENCOUNTER — Other Ambulatory Visit: Payer: Self-pay

## 2023-01-26 ENCOUNTER — Emergency Department (HOSPITAL_COMMUNITY): Payer: Medicare HMO

## 2023-01-26 ENCOUNTER — Emergency Department (HOSPITAL_COMMUNITY)
Admission: EM | Admit: 2023-01-26 | Discharge: 2023-01-26 | Disposition: A | Discharge status: Home / Self Care | Payer: Medicare HMO | Attending: Emergency Medicine | Admitting: Emergency Medicine

## 2023-01-26 ENCOUNTER — Encounter (HOSPITAL_COMMUNITY): Payer: Self-pay | Admitting: Emergency Medicine

## 2023-01-26 DIAGNOSIS — S7002XA Contusion of left hip, initial encounter: Secondary | ICD-10-CM | POA: Diagnosis not present

## 2023-01-26 DIAGNOSIS — S40012A Contusion of left shoulder, initial encounter: Secondary | ICD-10-CM | POA: Diagnosis not present

## 2023-01-26 DIAGNOSIS — W19XXXA Unspecified fall, initial encounter: Secondary | ICD-10-CM

## 2023-01-26 DIAGNOSIS — W050XXA Fall from non-moving wheelchair, initial encounter: Secondary | ICD-10-CM | POA: Diagnosis not present

## 2023-01-26 DIAGNOSIS — S0990XA Unspecified injury of head, initial encounter: Secondary | ICD-10-CM

## 2023-01-26 DIAGNOSIS — S4992XA Unspecified injury of left shoulder and upper arm, initial encounter: Secondary | ICD-10-CM | POA: Diagnosis present

## 2023-01-26 MED ORDER — ACETAMINOPHEN 325 MG PO TABS: 650.0000 mg | ORAL_TABLET | Freq: Four times a day (QID) | ORAL | Status: DC | PRN
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
## 2023-01-26 NOTE — ED Provider Notes (Signed)
[place] [facility]MENT AT [facility] Provider Note   CSN: [id number] Arrival date & time: 01/26/23  0043     History  Chief Complaint  Patient presents with   [name]. is a 57 y.o. male.  The history is provided by the patient.  Fall  [name]. is a 57 y.o. male who presents to the Emergency Department complaining of fall.  He presents to the emergency department for evaluation of injuries after he was in his wheelchair and it flipped when he was going over a curb and he fell backwards, striking his head and his left shoulder and hip.  He complains of pain to the back of his head and neck as well as left shoulder and hip.  This happened around 8 or 830.  He does have a history of multiple prior fractures.  He also has a history of diabetes.  He does not take any blood thinners.     Home Medications Prior to Admission medications   Medication Sig Start Date End Date Taking? Authorizing Provider  acetaminophen (TYLENOL) 650 MG CR tablet Take 1,300 mg by mouth every 8 (eight) hours as needed for pain.    [provider]  atorvastatin (LIPITOR) 40 MG tablet Take 40 mg by mouth at bedtime. 05/07/22   [provider]  diclofenac sodium (VOLTAREN) 1 % GEL Apply 2 g 4 (four) times daily topically. Patient taking differently: Apply 2 g topically 2 (two) times daily as needed (pain). 07/19/17   Love, Evlyn Kanner, PA-C  divalproex (DEPAKOTE ER) 250 MG 24 hr tablet Take 1 tablet (250 mg total) by mouth at bedtime. 06/12/22 07/12/22  Earney Navy, NP  fesoterodine (TOVIAZ) 4 MG TB24 tablet Take 4 mg by mouth daily.    [provider]  furosemide (LASIX) 20 MG tablet TAKE 1 TABLET BY MOUTH EVERY DAY Patient taking differently: Take 20 mg by mouth daily as needed for fluid. 03/01/21   Arnette Felts, FNP  gabapentin (NEURONTIN) 300 MG capsule Take 2 capsules (600 mg total) by mouth 3 (three) times daily. 06/12/22  07/12/22  Earney Navy, NP  latanoprost (XALATAN) 0.005 % ophthalmic solution Place 1 drop into both eyes at bedtime.    [provider]  lidocaine 4 % Place 1 patch onto the skin daily as needed (pain).    [provider]  mirtazapine (REMERON) 30 MG tablet Take 30 mg by mouth at bedtime.    [provider]  oxyCODONE-acetaminophen (PERCOCET) 7.5-325 MG tablet Take 1 tablet by mouth every 8 (eight) hours as needed for pain.    [provider]  OZEMPIC, 0.25 OR 0.5 MG/DOSE, 2 MG/3ML SOPN Inject 0.5 mg into the skin once a week. Sunday 05/29/22   [provider]  sertraline (ZOLOFT) 50 MG tablet Take 75 mg by mouth daily. Patient not taking: Reported on 06/09/2022    [provider]  tamsulosin (FLOMAX) 0.4 MG CAPS capsule TAKE 1 CAPSULE EVERY DAY Patient taking differently: Take 0.4 mg by mouth daily. 07/11/21   Arnette Felts, FNP  timolol (TIMOPTIC) 0.5 % ophthalmic solution Place 1 drop into both eyes 2 (two) times daily. 12/23/20   [provider]  venlafaxine XR (EFFEXOR-XR) 150 MG 24 hr capsule Take 300 mg by mouth at bedtime. 04/20/22   [provider]      Allergies    Patient has no known allergies.    Review of  Systems   Review of Systems  All other systems reviewed and are negative.   Physical Exam Updated Vital Signs BP (!) 150/100   Pulse 82   Temp 98.7 F (37.1 C)   Resp 18   Ht 5\' 5"  (1.651 m)   Wt 105.2 kg   SpO2 100%   BMI 38.61 kg/m  Physical Exam Vitals and nursing note reviewed.  Constitutional:      Appearance: He is well-developed.  HENT:     Head: Normocephalic and atraumatic.  Cardiovascular:     Rate and Rhythm: Normal rate and regular rhythm.     Heart sounds: No murmur heard. Pulmonary:     Effort: Pulmonary effort is normal.     Breath sounds: Normal breath sounds.  Abdominal:     Palpations: Abdomen is soft.     Tenderness: There is no abdominal tenderness. There is no  guarding or rebound.  Musculoskeletal:     Comments: There is mild tenderness to palpation over the left posterior shoulder as well as the left lateral hip.  He is able to range the shoulder but cannot fully abduct the shoulder.  He is able to range the hip.  Skin:    General: Skin is warm and dry.  Neurological:     Mental Status: He is alert and oriented to person, place, and time.  Psychiatric:        Behavior: Behavior normal.     ED Results / Procedures / Treatments   Labs (all labs ordered are listed, but only abnormal results are displayed) Labs Reviewed - No data to display  EKG None  Radiology CT Head Wo Contrast  Result Date: 01/26/2023 CLINICAL DATA:  Trauma. EXAM: CT HEAD WITHOUT CONTRAST CT CERVICAL SPINE WITHOUT CONTRAST TECHNIQUE: Multidetector CT imaging of the head and cervical spine was performed following the standard protocol without intravenous contrast. Multiplanar CT image reconstructions of the cervical spine were also generated. RADIATION DOSE REDUCTION: This exam was performed according to the departmental dose-optimization program which includes automated exposure control, adjustment of the mA and/or kV according to patient size and/or use of iterative reconstruction technique. COMPARISON:  CT dated 01/07/2023. FINDINGS: Evaluation is limited due to streak artifact caused by spinal hardware. CT HEAD FINDINGS Brain: The ventricles and sulci are appropriate size for the patient's age. The gray-white matter discrimination is preserved. There is no acute intracranial hemorrhage. No mass effect or midline shift. No extra-axial fluid collection. Vascular: No hyperdense vessel or unexpected calcification. Skull: Normal. Negative for fracture or focal lesion. Sinuses/Orbits: No acute finding. Other: None CT CERVICAL SPINE FINDINGS Alignment: No acute subluxation. Skull base and vertebrae: No acute fracture.  Osteopenia. Soft tissues and spinal canal: No prevertebral fluid or  swelling. No visible canal hematoma. Disc levels: No acute findings. Multilevel degenerative changes. Posterior fusion of the occiput and C1-C3 as well as anterior fusion at C5-C6. Upper chest: Negative. Other: None IMPRESSION: 1. No acute intracranial pathology. 2. No acute/traumatic cervical spine pathology. Electronically Signed   By: Elgie Collard M.D.   On: 01/26/2023 02:37   CT Cervical Spine Wo Contrast  Result Date: 01/26/2023 CLINICAL DATA:  Trauma. EXAM: CT HEAD WITHOUT CONTRAST CT CERVICAL SPINE WITHOUT CONTRAST TECHNIQUE: Multidetector CT imaging of the head and cervical spine was performed following the standard protocol without intravenous contrast. Multiplanar CT image reconstructions of the cervical spine were also generated. RADIATION DOSE REDUCTION: This exam was performed according to the departmental dose-optimization program which includes automated exposure  control, adjustment of the mA and/or kV according to patient size and/or use of iterative reconstruction technique. COMPARISON:  CT dated 01/07/2023. FINDINGS: Evaluation is limited due to streak artifact caused by spinal hardware. CT HEAD FINDINGS Brain: The ventricles and sulci are appropriate size for the patient's age. The gray-white matter discrimination is preserved. There is no acute intracranial hemorrhage. No mass effect or midline shift. No extra-axial fluid collection. Vascular: No hyperdense vessel or unexpected calcification. Skull: Normal. Negative for fracture or focal lesion. Sinuses/Orbits: No acute finding. Other: None CT CERVICAL SPINE FINDINGS Alignment: No acute subluxation. Skull base and vertebrae: No acute fracture.  Osteopenia. Soft tissues and spinal canal: No prevertebral fluid or swelling. No visible canal hematoma. Disc levels: No acute findings. Multilevel degenerative changes. Posterior fusion of the occiput and C1-C3 as well as anterior fusion at C5-C6. Upper chest: Negative. Other: None IMPRESSION: 1.  No acute intracranial pathology. 2. No acute/traumatic cervical spine pathology. Electronically Signed   By: Elgie Collard M.D.   On: 01/26/2023 02:37   DG Hip Unilat With Pelvis 2-3 Views Left  Result Date: 01/26/2023 CLINICAL DATA:  Larey Seat out of wheelchair EXAM: DG HIP (WITH OR WITHOUT PELVIS) 2-3V LEFT COMPARISON:  Radiographs 05/12/2018 FINDINGS: Postoperative changes of ORIF of the left femur across the healed proximal left femur diaphysis fracture. No acute fracture or dislocation. Mild degenerative arthritis both hips. IMPRESSION: No acute fracture or dislocation. Electronically Signed   By: Minerva Fester M.D.   On: 01/26/2023 02:10   DG Shoulder Left  Result Date: 01/26/2023 CLINICAL DATA:  Fall out of wheelchair with left shoulder pain EXAM: LEFT SHOULDER - 2+ VIEW COMPARISON:  Radiographs 06/11/2012 FINDINGS: There is no evidence of fracture or dislocation. Mild arthritis of the glenohumeral and AC joints. Soft tissues are unremarkable. IMPRESSION: No acute fracture or dislocation. Electronically Signed   By: Minerva Fester M.D.   On: 01/26/2023 02:08    Procedures Procedures    Medications Ordered in ED Medications  acetaminophen (TYLENOL) tablet 650 mg (has no administration in time range)    ED Course/ Medical Decision Making/ A&P                             Medical Decision Making Amount and/or Complexity of Data Reviewed Radiology: ordered.  Risk OTC drugs.   Patient here for evaluation of injuries after he had a mechanical fall out of his wheelchair.  CT head and neck are negative for acute abnormality.  Left shoulder and hip x-ray are negative for acute fracture or dislocation-images personally reviewed and interpreted, agree with radiologist interpretation.  Patient treated with acetaminophen for his headache.  Feel he is stable for discharge home with outpatient follow-up and return precautions.        Final Clinical Impression(s) / ED Diagnoses Final  diagnoses:  Fall, initial encounter  Minor head injury, initial encounter  Contusion of left shoulder, initial encounter  Contusion of left hip, initial encounter    Rx / DC Orders ED Discharge Orders     None         Tilden Fossa, MD 01/26/23 0310

## 2023-08-03 ENCOUNTER — Other Ambulatory Visit: Payer: Self-pay

## 2023-08-03 ENCOUNTER — Encounter (HOSPITAL_BASED_OUTPATIENT_CLINIC_OR_DEPARTMENT_OTHER): Payer: Self-pay

## 2023-08-03 ENCOUNTER — Emergency Department (HOSPITAL_BASED_OUTPATIENT_CLINIC_OR_DEPARTMENT_OTHER)
Admission: EM | Admit: 2023-08-03 | Discharge: 2023-08-03 | Disposition: A | Discharge status: Against Medical Advice | Payer: Medicare HMO | Attending: Emergency Medicine | Admitting: Emergency Medicine

## 2023-08-03 DIAGNOSIS — Z5321 Procedure and treatment not carried out due to patient leaving prior to being seen by health care provider: Secondary | ICD-10-CM | POA: Insufficient documentation

## 2023-08-03 DIAGNOSIS — M545 Low back pain, unspecified: Secondary | ICD-10-CM | POA: Diagnosis present

## 2023-08-03 DIAGNOSIS — M549 Dorsalgia, unspecified: Secondary | ICD-10-CM

## 2023-08-03 LAB — URINALYSIS, MICROSCOPIC (REFLEX): WBC, UA: >50 WBC/hpf (ref 0–5)

## 2023-08-03 LAB — URINALYSIS, ROUTINE W REFLEX MICROSCOPIC
Bilirubin Urine: NEGATIVE
Glucose, UA: NEGATIVE mg/dL
Hgb urine dipstick: NEGATIVE
Ketones, ur: NEGATIVE mg/dL
Nitrite: NEGATIVE
Protein, ur: 100 mg/dL — AB
Specific Gravity, Urine: >=1.03 (ref 1.005–1.030)
pH: 6.5 (ref 5.0–8.0)

## 2023-08-03 NOTE — ED Notes (Signed)
Pt's wife needed to be at work at 1500 but was willing to stay for pt to be seen. Pt in electric wheelchair and said "okay let's go" and speedily rolled away before wife could convince him to stay. Pt and wife did not return.   Robina Ade, RN

## 2023-08-03 NOTE — ED Provider Notes (Signed)
Patient eloped from the emergency department prior to my assessment per nursing.  I did not see or evaluate patient.   Dinari Stgermaine A, PA-C 08/03/23 1431    Alvira Monday, MD 08/03/23 1818

## 2023-08-03 NOTE — ED Triage Notes (Addendum)
The patient back pain for one week. No injury. Fall bracelet applied

## 2023-12-02 ENCOUNTER — Encounter: Payer: Self-pay | Admitting: Physical Medicine & Rehabilitation

## 2024-01-01 ENCOUNTER — Encounter: Payer: Self-pay | Admitting: Physical Medicine & Rehabilitation

## 2024-01-01 ENCOUNTER — Encounter: Attending: Physical Medicine & Rehabilitation | Admitting: Physical Medicine & Rehabilitation

## 2024-01-01 VITALS — BP 136/85 | HR 79 | Ht 65.0 in | Wt 242.0 lb

## 2024-01-01 DIAGNOSIS — G894 Chronic pain syndrome: Secondary | ICD-10-CM | POA: Diagnosis present

## 2024-01-01 DIAGNOSIS — G6181 Chronic inflammatory demyelinating polyneuritis: Secondary | ICD-10-CM | POA: Insufficient documentation

## 2024-01-01 DIAGNOSIS — M961 Postlaminectomy syndrome, not elsewhere classified: Secondary | ICD-10-CM | POA: Diagnosis present

## 2024-01-01 MED ORDER — GABAPENTIN 600 MG PO TABS
600.0000 mg | ORAL_TABLET | Freq: Four times a day (QID) | ORAL | 6 refills | Status: AC
Start: 2024-01-01 — End: ?

## 2024-01-01 MED ORDER — CYCLOBENZAPRINE HCL 10 MG PO TABS
10.0000 mg | ORAL_TABLET | Freq: Three times a day (TID) | ORAL | 4 refills | Status: AC | PRN
Start: 2024-01-01 — End: ?

## 2024-01-01 NOTE — Progress Notes (Signed)
 Subjective:    Patient ID: Vincent Duty Sr., male    DOB: [birth year], 58 y.o.   MRN: [id number]  HPI  This is an initial office visit for [name] whom I last saw in 2019 after a traumatic brain injury and C4 spinal cord injury. He suffers from major depression with a history of subtance abuse. Since we last met he has been diagnosed with CIDP and is dealing with chronic pain.   His primary pain is his neck. He struggles to hold his head up as his neck hurts when he extends. He tends to sit with his head forward. The neck pain can be sever enough where it causes headaches. He gets some relief with leaning backwards or using a lidocaine  patch. He had cervical fusion C1-3 and C5-6. Most recent CT from 2024 demonstrate:  Multilevel degenerative changes.Posterior fusion of the occiput and C1-C3 as well as anterior fusion at C5-C6.  He is also having low back  when he sits for too long or if he lays down. The pain occurs in both his back and his legs. The left leg bothers him more than the right. He has some chronic wounds in his left leg as well. He's had low back surgery as well.  He was not very to the point about the affects of CIDP on his pain. In fact, when I mentioned it, he didn't even know what it was. Nevertheless it appears he's having neuropathic pain in both of his legs, perhaps right more than left.   He has been seen by the [facility] clinic for the past 5 mos or so. He feels that they did help with his pain but they no longer take his insurance. He has been on gabapentin , percocet for his axial and nerve pain. He had been on a muscle relaxanst, flexeril  and robaxin ,  too. He last took percocet a month ago as his primary is not writing for it.  He told me he has been using CBD oil and CBD gummies since bein out of his percocet.   For exercise he does house work, walks the dog, goes to the gym on occasion using the elliptical, the pool. He essentially "soldiers" through it more  than anything else. It's not enjoyable to do any of those things because of the pain and postural issues.   From a mood standpoint he is involved with his church to give him support. He has a group he spends time with which particularly provides support.  He has lost a lot of people in his life including a daughter which has sent him into depression at times before he became more involved with church.  He is on effexor  and zoloft  but is not being followed by psychiatry as he has an outstanding bill.      Pain Inventory Average Pain 10 Pain Right Now 10 My pain is constant, sharp, burning, dull, stabbing, tingling, and aching  In the last 24 hours, has pain interfered with the following? General activity 7 Relation with others 7 Enjoyment of life 8 What TIME of day is your pain at its worst? night Sleep (in general) Poor  Pain is worse with: walking and some activites Pain improves with: heat/ice and medication Relief from Meds: 8  walk with assistance use a cane use a walker ability to climb steps?  yes do you drive?  no use a wheelchair needs help with transfers Do you have any goals in this area?  yes  disabled: date disabled 2018 I need assistance with the following:  dressing, bathing, and household duties Do you have any goals in this area?  yes  bladder control problems weakness numbness tingling trouble walking depression anxiety  Any changes since last visit?  no  Any changes since last visit?  no    Family History  Problem Relation Age of Onset   Hypothyroidism Mother    Hypertension Father    Social History   Socioeconomic History   Marital status: Married    Spouse name: Tyra Galley   Number of children: 4   Years of education: 12   Highest education level: Not on file  Occupational History    Comment: unemployed  Tobacco Use   Smoking status: Every Day    Current packs/day: 0.50    Average packs/day: 0.5 packs/day for 8.0 years (4.0 ttl  pk-yrs)    Types: Cigarettes   Smokeless tobacco: Never  Vaping Use   Vaping status: Every Day  Substance and Sexual Activity   Alcohol  use: Not Currently   Drug use: Not Currently   Sexual activity: Not on file  Other Topics Concern   Not on file  Social History Narrative   ** Merged History Encounter **       Lives at home wife, children Caffeine use- coffee 2-3 cups    Social Drivers of Health   Financial Resource Strain: Low Risk  (12/29/2020)   Overall Financial Resource Strain (CARDIA)    Difficulty of Paying Living Expenses: Not very hard  Food Insecurity: No Food Insecurity (10/20/2021)   Hunger Vital Sign    Worried About Running Out of Food in the Last Year: Never true    Ran Out of Food in the Last Year: Never true  Transportation Needs: No Transportation Needs (10/20/2021)   PRAPARE - Administrator, Civil Service (Medical): No    Lack of Transportation (Non-Medical): No  Physical Activity: Inactive (12/29/2020)   Exercise Vital Sign    Days of Exercise per Week: 0 days    Minutes of Exercise per Session: 0 min  Stress: Stress Concern Present (12/29/2020)   [employer] of Occupational Health - Occupational Stress Questionnaire    Feeling of Stress : Very much  Social Connections: Unknown (01/21/2022)   [facility]   Social Network    Social Network: Not on file   Past Surgical History:  Procedure Laterality Date   BACK SURGERY     yrs ago   CLOSED REDUCTION MANDIBLE N/A 05/07/2017   Procedure: CLOSED REDUCTION MANDIBULAR;  Surgeon: Virgina Grills, MD;  Location: [facility] OR;  Service: ENT;  Laterality: N/A;   ESOPHAGOGASTRODUODENOSCOPY N/A 05/07/2017   Procedure: ESOPHAGOGASTRODUODENOSCOPY (EGD);  Surgeon: Dorena Gander, MD;  Location: [facility] OR;  Service: General;  Laterality: N/A;   EXTERNAL FIXATION LEG Left 05/03/2017   Procedure: EXTERNAL FIXATION LEFT LOWER LEG;  Surgeon: Jasmine Mesi, MD;  Location: [facility] OR;   Service: Orthopedics;  Laterality: Left;   EXTERNAL FIXATION REMOVAL Left 05/16/2017   Procedure: REMOVAL EXTERNAL FIXATION LEG;  Surgeon: Laneta Pintos, MD;  Location: [facility] OR;  Service: Orthopedics;  Laterality: Left;   FEMUR IM NAIL Left 05/03/2017   Procedure: LEFT INTRAMEDULLARY (IM) NAIL FEMORAL;  Surgeon: Jasmine Mesi, MD;  Location: [facility] OR;  Service: Orthopedics;  Laterality: Left;   I & D EXTREMITY Left 05/03/2017   Procedure: IRRIGATION AND DEBRIDEMENT LEFT LOWER EXTREMITY;  Surgeon: Jasmine Mesi, MD;  Location: [facility] OR;  Service: Orthopedics;  Laterality: Left;   I & D EXTREMITY Left 05/07/2017   Procedure: IRRIGATION AND DEBRIDEMENT, LEFT TIBIA  EX-FIX ADJUSTMENT;  Surgeon: Laneta Pintos, MD;  Location: [facility] OR;  Service: Orthopedics;  Laterality: Left;   INCISION AND DRAINAGE OF WOUND Right 05/03/2017   Procedure: IRRIGATION AND DEBRIDEMENT WOUND;  Surgeon: Jasmine Mesi, MD;  Location: [facility] OR;  Service: Orthopedics;  Laterality: Right;   MANDIBULAR HARDWARE REMOVAL N/A 06/26/2017   Procedure: MANDIBULAR HARDWARE REMOVAL;  Surgeon: Virgina Grills, MD;  Location: [facility] OR;  Service: ENT;  Laterality: N/A;   ORIF TIBIA FRACTURE Left 05/16/2017   Procedure: OPEN REDUCTION INTERNAL FIXATION (ORIF) LEFT DISTAL TIBIA FRACTURE, REMOVAL OF EX-FIX;  Surgeon: Laneta Pintos, MD;  Location: [facility] OR;  Service: Orthopedics;  Laterality: Left;   PEG PLACEMENT N/A 05/07/2017   Procedure: PERCUTANEOUS ENDOSCOPIC GASTROSTOMY (PEG) PLACEMENT;  Surgeon: Dorena Gander, MD;  Location: [facility] OR;  Service: General;  Laterality: N/A;   POSTERIOR CERVICAL FUSION/FORAMINOTOMY N/A 05/10/2017   Procedure: POSTERIOR CERVICAL FUSION OCCIPUT- CERVICAL FOUR;  Surgeon: Augusto Blonder, MD;  Location: [facility] OR;  Service: Neurosurgery;  Laterality: N/A;   TRACHEOSTOMY TUBE PLACEMENT N/A 05/07/2017   Procedure: TRACHEOSTOMY;  Surgeon: Virgina Grills, MD;  Location: [facility] OR;  Service: ENT;  Laterality: N/A;   Past Medical  History:  Diagnosis Date   Brain injury (HCC)    Chronic back pain    History of suicidal ideation 01/2017   with overdose/ [facility] admission   Lumbago with sciatica    left side   Vitamin D  deficiency    There were no vitals taken for this visit.  Opioid Risk Score:   Fall Risk Score:  `1  Depression screen [facility] 2/9     06/29/2021   10:37 AM 03/02/2021   10:46 AM 12/29/2020   12:37 PM 11/02/2020   11:44 AM 03/15/2020    4:59 PM 09/24/2019    3:23 PM 07/01/2019   10:59 AM  Depression screen PHQ 2/9  Decreased Interest 3 3 0 3 0 0 2  Down, Depressed, Hopeless 3 2 0 3 2 0 2  PHQ - 2 Score 6 5 0 6 2 0 4  Altered sleeping 2 3  3 2  3   Tired, decreased energy 3 3  3  0  3  Change in appetite 1 3  3  0  0  Feeling bad or failure about yourself  0 1  2 0  0  Trouble concentrating 0 1  0 2  0  Moving slowly or fidgety/restless 0 0  1 2  0  Suicidal thoughts 0 1  1 0  0  PHQ-9 Score 12 17  19 8  10   Difficult doing work/chores Not difficult at all Somewhat difficult  Somewhat difficult Somewhat difficult  Very difficult    Review of Systems  Cardiovascular:  Positive for leg swelling.  Musculoskeletal:  Positive for gait problem.  Neurological:  Positive for weakness and numbness.  Psychiatric/Behavioral:         Depression  All other systems reviewed and are negative.      Objective:   Physical Exam Gen: no distress, normal appearing HEENT: oral mucosa pink and moist, NCAT Cardio: Reg rate Chest: normal effort, normal rate of breathing Abd: soft, non-distended Ext: no edema Psych: pleasant, normal affect Skin: chronic wound/scarring of LLE, almost psoriatic in appearnace.  Neuro: Alert and oriented x 3. Fair insight and awareness. Fair  Memory. Normal language and speech. Cranial nerve exam unremarkable except for his left eye which is weak with medial and vertical movement. MMT: LUE 3+/5 deltoids, 4/5 distally except hand intrinsics which are 4-/5. R.  RUE is 3 to 3+/5 deltoid,  4/5 biceps, triceps and 3-4/5 at hand with flexion contractures of digits 3-5 at least. BLE: 3/5 HF, KE, 3-4/5 APF and 1/5 ADF bilaterally. Decreased LT in both legs and arms, although he is able to sense pain and gross touch. DTR's are brisk at 3+ throughout. No abnl resting tone except for right finger flexors which were 1+/4.  Musculoskeletal: Pt with head forward posture. Scarring of prior cervical surgery site with atrophy. Pt unable to extend his neck past neutral. I saw no significant dextro or levo scoliosis although there was rotation of lumbar spine counter clockwise. Pt was limited with lumbar flexion and extension d/t stability in standing as well as pain. Lumbar surgery scar noted.  Both shoulders limited with ER/IR which caused pain. I was unable to abduct him past 85-90 degrees, and ER/IR past 30 degrees or so which produced pain       Assessment & Plan:  Chronic pain syndrome which is multifactorial and overlapping: Hx of TBI Hx of C4 spinal cord injury Postlaminectomy syndrome cervical/lumbar CIDP  These diagnoses have left him with the following symptoms Neck/shoulder girdle/low back pain Neuropathic pain in the LE's Spasticity with resting tone in the RUE Poor mechanics and posture  3. Hx of major depression which has impacted all of these symptoms as well     Plan: Referral to [facility] PT, OT to address posture, balance, rom, orthotics, modaliltes for pain, HEP Increase gabapentin  to 600mg  qid Flexeril  10mg  q8 hr prn Drug swab today. He says he used CBD gummies up to Friday. I am willing to rx percocet 7.5mg  18 prn if he stops. We discussed the slippery slope narcotics can be.  TENS unit for back/shoulders/neck---zynex ordered today Consider AFO's for foot drop Management of depression is important. I can help while he is paying off the bill at his psychiatrist Discussed other means for pain mgt including topical remedies, social activities, leisure,  church/social supports, etc I spent well over an hour reviewing his records, interviewing the patient, examining, and comiing up with a custom treatment plan.

## 2024-01-01 NOTE — Patient Instructions (Addendum)
 ALWAYS FEEL FREE TO CALL OUR OFFICE WITH ANY PROBLEMS OR QUESTIONS [phone])  **PLEASE NOTE** ALL MEDICATION REFILL REQUESTS (INCLUDING CONTROLLED SUBSTANCES) NEED TO BE MADE AT LEAST 7 DAYS PRIOR TO REFILL BEING DUE. ANY REFILL REQUESTS INSIDE THAT TIME FRAME MAY RESULT IN DELAYS IN RECEIVING YOUR PRESCRIPTION.     INCREASE GABAPENTIN  TO 600MG  4X DAILY  TENS COMPANY SHOULD CALL YOU  NO CBD PRODUCTS WHILE ON CONTROLLED SUBSTANCES

## 2024-01-04 LAB — DRUG TOX MONITOR 1 W/CONF, ORAL FLD
Amphetamines: NEGATIVE ng/mL (ref <10)
Barbiturates: NEGATIVE ng/mL (ref <10)
Benzodiazepines: NEGATIVE ng/mL (ref <0.50)
Buprenorphine: NEGATIVE ng/mL (ref <0.10)
Cocaine: NEGATIVE ng/mL (ref <5.0)
Cotinine: >250 ng/mL — ABNORMAL HIGH (ref <5.0)
Fentanyl: NEGATIVE ng/mL (ref <0.10)
Heroin Metabolite: NEGATIVE ng/mL (ref <1.0)
MARIJUANA: POSITIVE ng/mL — AB (ref <2.5)
MDMA: NEGATIVE ng/mL (ref <10)
Meprobamate: NEGATIVE ng/mL (ref <2.5)
Methadone: NEGATIVE ng/mL (ref <5.0)
Nicotine Metabolite: POSITIVE ng/mL — AB (ref <5.0)
Opiates: NEGATIVE ng/mL (ref <2.5)
Phencyclidine: NEGATIVE ng/mL (ref <10)
THC: 13.5 ng/mL — ABNORMAL HIGH (ref <2.5)
Tapentadol: NEGATIVE ng/mL (ref <5.0)
Tramadol: NEGATIVE ng/mL (ref <5.0)
Zolpidem: NEGATIVE ng/mL (ref <5.0)

## 2024-01-04 LAB — DRUG TOX ALC METAB W/CON, ORAL FLD: Alcohol Metabolite: NEGATIVE ng/mL (ref <25)

## 2024-01-15 ENCOUNTER — Telehealth: Payer: Self-pay | Admitting: *Deleted

## 2024-01-15 NOTE — Telephone Encounter (Signed)
 Mr [name] called requesting Dr Rachel Budds refill his pain medication.

## 2024-01-16 MED ORDER — OXYCODONE-ACETAMINOPHEN 7.5-325 MG PO TABS: 1.0000 | ORAL_TABLET | Freq: Two times a day (BID) | ORAL | 0 refills | Status: DC | PRN

## 2024-01-16 NOTE — Telephone Encounter (Signed)
 I gave him a limited supply which is to last 30 days. (He had been receiving nothing for almost 1 month+ prior to the appt with us ).  Further rx'es are contingent upon a clean UDS.

## 2024-01-20 NOTE — Therapy (Incomplete)
 OUTPATIENT OCCUPATIONAL THERAPY NEURO EVALUATION  Patient Name: Vincent Buckalew Sr. MRN: [id number] DOB:19-Nov-1965, 58 y.o., male Today's Date: 01/20/2024  PCP: unknown REFERRING PROVIDER: Dr. Rachel Budds  END OF SESSION:   Past Medical History:  Diagnosis Date   Brain injury [facility])    Chronic back pain    History of suicidal ideation 01/2017   with overdose/ [facility] admission   Lumbago with sciatica    left side   Vitamin D  deficiency    Past Surgical History:  Procedure Laterality Date   BACK SURGERY     yrs ago   CLOSED REDUCTION MANDIBLE N/A 05/07/2017   Procedure: CLOSED REDUCTION MANDIBULAR;  Surgeon: Virgina Grills, MD;  Location: [facility] OR;  Service: ENT;  Laterality: N/A;   ESOPHAGOGASTRODUODENOSCOPY N/A 05/07/2017   Procedure: ESOPHAGOGASTRODUODENOSCOPY (EGD);  Surgeon: Dorena Gander, MD;  Location: [facility] OR;  Service: General;  Laterality: N/A;   EXTERNAL FIXATION LEG Left 05/03/2017   Procedure: EXTERNAL FIXATION LEFT LOWER LEG;  Surgeon: Jasmine Mesi, MD;  Location: [facility];  Service: Orthopedics;  Laterality: Left;   EXTERNAL FIXATION REMOVAL Left 05/16/2017   Procedure: REMOVAL EXTERNAL FIXATION LEG;  Surgeon: Laneta Pintos, MD;  [facility] OR;  Service: Orthopedics;  Laterality: Left;   FEMUR IM NAIL Left 05/03/2017   Procedure: LEFT INTRAMEDULLARY (IM) NAIL FEMORAL;  Surgeon: Jasmine Mesi, MD;  Location: [facility] OR;  Service: Orthopedics;  Laterality: Left;   I & D EXTREMITY Left 05/03/2017   Procedure: IRRIGATION AND DEBRIDEMENT LEFT LOWER EXTREMITY;  Surgeon: Jasmine Mesi, MD;  Location: [facility] OR;  Service: Orthopedics;  Laterality: Left;   I & D EXTREMITY Left 05/07/2017   Procedure: IRRIGATION AND DEBRIDEMENT, LEFT TIBIA  EX-FIX ADJUSTMENT;  Surgeon: Laneta Pintos, MD;  Location: [facility] OR;  Service: Orthopedics;  Laterality: Left;   INCISION AND DRAINAGE OF WOUND Right 05/03/2017   Procedure: IRRIGATION AND DEBRIDEMENT WOUND;  Surgeon: Jasmine Mesi, MD;   Location: [facility] OR;  Service: Orthopedics;  Laterality: Right;   MANDIBULAR HARDWARE REMOVAL N/A 06/26/2017   Procedure: MANDIBULAR HARDWARE REMOVAL;  Surgeon: Virgina Grills, MD;  Location: [facility] OR;  Service: ENT;  Laterality: N/A;   ORIF TIBIA FRACTURE Left 05/16/2017   Procedure: OPEN REDUCTION INTERNAL FIXATION (ORIF) LEFT DISTAL TIBIA FRACTURE, REMOVAL OF EX-FIX;  Surgeon: Laneta Pintos, MD;  [facility] OR;  Service: Orthopedics;  Laterality: Left;   PEG PLACEMENT N/A 05/07/2017   Procedure: PERCUTANEOUS ENDOSCOPIC GASTROSTOMY (PEG) PLACEMENT;  Surgeon: Dorena Gander, MD;  Location: [facility] OR;  Service: General;  Laterality: N/A;   POSTERIOR CERVICAL FUSION/FORAMINOTOMY N/A 05/10/2017   Procedure: POSTERIOR CERVICAL FUSION OCCIPUT- CERVICAL FOUR;  Surgeon: Augusto Blonder, MD;  [facility] OR;  Service: Neurosurgery;  Laterality: N/A;   TRACHEOSTOMY TUBE PLACEMENT N/A 05/07/2017   Procedure: TRACHEOSTOMY;  Surgeon: Virgina Grills, MD;  Location: [facility] OR;  Service: ENT;  Laterality: N/A;   Patient Active Problem List   Diagnosis Date Noted   CIDP (chronic inflammatory demyelinating polyneuropathy) (HCC) 01/01/2024   Cervical post-laminectomy syndrome 01/01/2024   Suicidal behavior with attempted self-injury (HCC) 06/09/2022   Pseudomonas urinary tract infection    Neurogenic bladder    Neck pain    Chronic pain syndrome    Transaminitis    Diffuse TBI w loss of consciousness of unsp duration, init (HCC) 05/21/2017   Tetraplegia (HCC) 05/21/2017   Fracture    Trauma    Polysubstance abuse (HCC)    Fever    Tachycardia  Post-operative pain    Leukocytosis    SIRS (systemic inflammatory response syndrome) (HCC)    Acute blood loss anemia    TBI (traumatic brain injury) (HCC) 05/03/2017   Polysubstance abuse (HCC) 01/12/2017    ONSET DATE: 01/01/24- referral date  REFERRING DIAG: G89.4 (ICD-10-CM) - Chronic pain syndrome G61.81 (ICD-10-CM) - CIDP (chronic inflammatory demyelinating  polyneuropathy) (HCC) M96.1 (ICD-10-CM) - Cervical post-laminectomy syndrome  THERAPY DIAG:  No diagnosis found.  Rationale for Evaluation and Treatment: {HABREHAB:27488}  SUBJECTIVE:   SUBJECTIVE STATEMENT: *** Pt accompanied by: {accompnied:27141}  PERTINENT HISTORY: Pt. s/p TBI, C4 SCI in 2019 He had cervical fusion C1-3 and C5-6. CIDP. Most recent CT from 2024 demonstrate:  Multilevel degenerative changes.Posterior fusion of the occiput and C1-C3 as well as anterior fusion at C5-C6.  PRECAUTIONS: {Therapy precautions:24002}  WEIGHT BEARING RESTRICTIONS: {Yes ***/No:24003}  PAIN:  Are you having pain? {OPRCPAIN:27236}  FALLS: Has patient fallen in last 6 months? {fallsyesno:27318}  LIVING ENVIRONMENT: Lives with: {OPRC lives with:25569::"lives with their family"} Lives in: {Lives in:25570} Stairs: {opstairs:27293} Has following equipment at home: {Assistive devices:23999}  PLOF: {PLOF:24004}  PATIENT GOALS: ***  OBJECTIVE:  Note: Objective measures were completed at Evaluation unless otherwise noted.  HAND DOMINANCE: {MISC; OT HAND DOMINANCE:[id number]}  ADLs: Overall ADLs: *** Transfers/ambulation related to ADLs: Eating: *** Grooming: *** UB Dressing: *** LB Dressing: *** Toileting: *** Bathing: *** Tub Shower transfers: *** Equipment: {equipment:25573}  IADLs: Shopping: *** Light housekeeping: *** Meal Prep: *** Community mobility: *** Medication management: *** Financial management: *** Handwriting: {OTWRITTENEXPRESSION:25361}  MOBILITY STATUS: {OTMOBILITY:25360}  POSTURE COMMENTS:  {posture:25561} Sitting balance: {sitting balance:25483}  ACTIVITY TOLERANCE: Activity tolerance: ***  FUNCTIONAL OUTCOME MEASURES: {OTFUNCTIONALMEASURES:27238}  UPPER EXTREMITY ROM:    {AROM/PROM:27142} ROM Right eval Left eval  Shoulder flexion    Shoulder abduction    Shoulder adduction    Shoulder extension    Shoulder internal rotation     Shoulder external rotation    Elbow flexion    Elbow extension    Wrist flexion    Wrist extension    Wrist ulnar deviation    Wrist radial deviation    Wrist pronation    Wrist supination    (Blank rows = not tested)  UPPER EXTREMITY MMT:   MMT: LUE 3+/5 deltoids, 4/5 distally except hand intrinsics which are 4-/5. R.  RUE is 3 to 3+/5 deltoid, 4/5 biceps, triceps and 3-4/5 at hand with flexion contractures of digits 3-5 at least.  MMT Right eval Left eval  Shoulder flexion    Shoulder abduction    Shoulder adduction    Shoulder extension    Shoulder internal rotation    Shoulder external rotation    Middle trapezius    Lower trapezius    Elbow flexion    Elbow extension    Wrist flexion    Wrist extension    Wrist ulnar deviation    Wrist radial deviation    Wrist pronation    Wrist supination    (Blank rows = not tested)  HAND FUNCTION: {handfunction:27230}  COORDINATION: {otcoordination:27237}  SENSATION: {sensation:27233}  EDEMA: ***  MUSCLE TONE: {UETONE:25567}  COGNITION: Overall cognitive status: {cognition:24006}  VISION: Subjective report: *** Baseline vision: {OTBASELINEVISION:25363} Visual history: {OTVISUALHISTORY:25364}  VISION ASSESSMENT: {visionassessment:27231}  Patient has difficulty with following activities due to following visual impairments: ***  PERCEPTION: {Perception:25564}  PRAXIS: {Praxis:25565}  OBSERVATIONS: ***  TREATMENT DATE: ***         PATIENT EDUCATION: Education details: *** Person educated: {Person educated:25204} Education method: {Education Method:25205} Education comprehension: {Education Comprehension:25206}  HOME EXERCISE PROGRAM: ***   GOALS: Goals reviewed with patient? {yes/no:20286}  SHORT TERM GOALS: Target date: ***  *** Baseline: Goal status:  INITIAL  2.  *** Baseline:  Goal status: INITIAL  3.  *** Baseline:  Goal status: INITIAL  4.  *** Baseline:  Goal status: INITIAL  5.  *** Baseline:  Goal status: INITIAL  6.  *** Baseline:  Goal status: INITIAL  LONG TERM GOALS: Target date: ***  *** Baseline:  Goal status: INITIAL  2.  *** Baseline:  Goal status: INITIAL  3.  *** Baseline:  Goal status: INITIAL  4.  *** Baseline:  Goal status: INITIAL  5.  *** Baseline:  Goal status: INITIAL  6.  *** Baseline:  Goal status: INITIAL  ASSESSMENT:  CLINICAL IMPRESSION: Patient is a 58 y.o. male who was seen today for occupational therapy evaluation for Pt. s/p TBI, C4 SCI in 2019 He had cervical fusion C1-3 and C5-6. CIDP. Most recent CT from 2024 demonstrate:  Multilevel degenerative changes.Posterior fusion of the occiput and C1-C3 as well as anterior fusion at C5-C6.Pt presents with the performance deficits below. He can benefit from skille doccupational therapy to address these deficits in order to maximize pt's safety and I with ADLs/IADLs.   PERFORMANCE DEFICITS: in functional skills including {OT physical skills:25468}, cognitive skills including {OT cognitive skills:25469}, and psychosocial skills including {OT psychosocial skills:25470}.   IMPAIRMENTS: are limiting patient from {OT performance deficits:25471}.   CO-MORBIDITIES: {Comorbidities:25485} that affects occupational performance. Patient will benefit from skilled OT to address above impairments and improve overall function.  MODIFICATION OR ASSISTANCE TO COMPLETE EVALUATION: {OT modification:25474}  OT OCCUPATIONAL PROFILE AND HISTORY: {OT PROFILE AND HISTORY:25484}  CLINICAL DECISION MAKING: {OT CDM:25475}  REHAB POTENTIAL: {rehabpotential:25112}  EVALUATION COMPLEXITY: {Evaluation complexity:25115}    PLAN:  OT FREQUENCY: {rehab frequency:25116}  OT DURATION: {rehab duration:25117}  PLANNED INTERVENTIONS: {OT  Interventions:25467}  RECOMMENDED OTHER SERVICES: ***  CONSULTED AND AGREED WITH PLAN OF CARE: {ZOX:09604}  PLAN FOR NEXT SESSION: ***   Derral Flick, OT 01/20/2024, 9:34 AM

## 2024-01-22 NOTE — Therapy (Incomplete)
 OUTPATIENT PHYSICAL THERAPY NEURO EVALUATION   Patient Name: Vincent Wisham Sr. MRN: [id number] DOB:[birth year], 58 y.o., male Today's Date: 01/22/2024   PCP: *** REFERRING PROVIDER: ***  END OF SESSION:   Past Medical History:  Diagnosis Date   Brain injury (HCC)    Chronic back pain    History of suicidal ideation 01/2017   with overdose/ [facility] admission   Lumbago with sciatica    left side   Vitamin D  deficiency    Past Surgical History:  Procedure Laterality Date   BACK SURGERY     yrs ago   CLOSED REDUCTION MANDIBLE N/A 05/07/2017   Procedure: CLOSED REDUCTION MANDIBULAR;  Surgeon: Virgina Grills, MD;  Location: [facility] OR;  Service: ENT;  Laterality: N/A;   ESOPHAGOGASTRODUODENOSCOPY N/A 05/07/2017   Procedure: ESOPHAGOGASTRODUODENOSCOPY (EGD);  Surgeon: Dorena Gander, MD;  Location: [facility] OR;  Service: General;  Laterality: N/A;   EXTERNAL FIXATION LEG Left 05/03/2017   Procedure: EXTERNAL FIXATION LEFT LOWER LEG;  Surgeon: Jasmine Mesi, MD;  Location: [facility] OR;  Service: Orthopedics;  Laterality: Left;   EXTERNAL FIXATION REMOVAL Left 05/16/2017   Procedure: REMOVAL EXTERNAL FIXATION LEG;  Surgeon: Laneta Pintos, MD;  Location: [facility] OR;  Service: Orthopedics;  Laterality: Left;   FEMUR IM NAIL Left 05/03/2017   Procedure: LEFT INTRAMEDULLARY (IM) NAIL FEMORAL;  Surgeon: Jasmine Mesi, MD;  Location: [facility] OR;  Service: Orthopedics;  Laterality: Left;   I & D EXTREMITY Left 05/03/2017   Procedure: IRRIGATION AND DEBRIDEMENT LEFT LOWER EXTREMITY;  Surgeon: Jasmine Mesi, MD;  Location: [facility] OR;  Service: Orthopedics;  Laterality: Left;   I & D EXTREMITY Left 05/07/2017   Procedure: IRRIGATION AND DEBRIDEMENT, LEFT TIBIA  EX-FIX ADJUSTMENT;  Surgeon: Laneta Pintos, MD;  Location: [facility] OR;  Service: Orthopedics;  Laterality: Left;   INCISION AND DRAINAGE OF WOUND Right 05/03/2017   Procedure: IRRIGATION AND DEBRIDEMENT WOUND;  Surgeon: Jasmine Mesi, MD;  Location:  [facility] OR;  Service: Orthopedics;  Laterality: Right;   MANDIBULAR HARDWARE REMOVAL N/A 06/26/2017   Procedure: MANDIBULAR HARDWARE REMOVAL;  Surgeon: Virgina Grills, MD;  Location: [facility] OR;  Service: ENT;  Laterality: N/A;   ORIF TIBIA FRACTURE Left 05/16/2017   Procedure: OPEN REDUCTION INTERNAL FIXATION (ORIF) LEFT DISTAL TIBIA FRACTURE, REMOVAL OF EX-FIX;  Surgeon: Laneta Pintos, MD;  Location: [facility] OR;  Service: Orthopedics;  Laterality: Left;   PEG PLACEMENT N/A 05/07/2017   Procedure: PERCUTANEOUS ENDOSCOPIC GASTROSTOMY (PEG) PLACEMENT;  Surgeon: Dorena Gander, MD;  Location: [facility] OR;  Service: General;  Laterality: N/A;   POSTERIOR CERVICAL FUSION/FORAMINOTOMY N/A 05/10/2017   Procedure: POSTERIOR CERVICAL FUSION OCCIPUT- CERVICAL FOUR;  Surgeon: Augusto Blonder, MD;  Location: [facility] OR;  Service: Neurosurgery;  Laterality: N/A;   TRACHEOSTOMY TUBE PLACEMENT N/A 05/07/2017   Procedure: TRACHEOSTOMY;  Surgeon: Virgina Grills, MD;  Location: [facility] OR;  Service: ENT;  Laterality: N/A;   Patient Active Problem List   Diagnosis Date Noted   CIDP (chronic inflammatory demyelinating polyneuropathy) (HCC) 01/01/2024   Cervical post-laminectomy syndrome 01/01/2024   Suicidal behavior with attempted self-injury (HCC) 06/09/2022   Pseudomonas urinary tract infection    Neurogenic bladder    Neck pain    Chronic pain syndrome    Transaminitis    Diffuse TBI w loss of consciousness of unsp duration, init (HCC) 05/21/2017   Tetraplegia (HCC) 05/21/2017   Fracture    Trauma    Polysubstance abuse (HCC)    Fever  Tachycardia    Post-operative pain    Leukocytosis    SIRS (systemic inflammatory response syndrome) (HCC)    Acute blood loss anemia    TBI (traumatic brain injury) (HCC) 05/03/2017   Polysubstance abuse (HCC) 01/12/2017    ONSET DATE: ***  REFERRING DIAG: ***  THERAPY DIAG:  No diagnosis found.  Rationale for Evaluation and Treatment: {HABREHAB:27488}  SUBJECTIVE:                                                                                                                                                                                              SUBJECTIVE STATEMENT: *** Pt accompanied by: {accompnied:27141}  PERTINENT HISTORY:  HPI from 01/01/24   This is an initial office visit for [name] whom I last saw in 2019 after a traumatic brain injury and C4 spinal cord injury. He suffers from major depression with a history of subtance abuse. Since we last met he has been diagnosed with CIDP and is dealing with chronic pain.    His primary pain is his neck. He struggles to hold his head up as his neck hurts when he extends. He tends to sit with his head forward. The neck pain can be sever enough where it causes headaches. He gets some relief with leaning backwards or using a lidocaine  patch. He had cervical fusion C1-3 and C5-6. Most recent CT from 2024 demonstrate:  Multilevel degenerative changes.Posterior fusion of the occiput and C1-C3 as well as anterior fusion at C5-C6.   He is also having low back  when he sits for too long or if he lays down. The pain occurs in both his back and his legs. The left leg bothers him more than the right. He has some chronic wounds in his left leg as well. He's had low back surgery as well.  He was not very to the point about the affects of CIDP on his pain. In fact, when I mentioned it, he didn't even know what it was. Nevertheless it appears he's having neuropathic pain in both of his legs, perhaps right more than left.    He has been seen by the [facility] clinic for the past 5 mos or so. He feels that they did help with his pain but they no longer take his insurance. He has been on gabapentin , percocet for his axial and nerve pain. He had been on a muscle relaxanst, flexeril  and robaxin ,  too. He last took percocet a month ago as his primary is not writing for it.  He told me he has been using CBD oil and CBD gummies since bein  out of  his percocet.    For exercise he does house work, walks the dog, goes to the gym on occasion using the elliptical, the pool. He essentially "soldiers" through it more than anything else. It's not enjoyable to do any of those things because of the pain and postural issues.    From a mood standpoint he is involved with his church to give him support. He has a group he spends time with which particularly provides support.  He has lost a lot of people in his life including a daughter which has sent him into depression at times before he became more involved with church.  He is on effexor  and zoloft  but is not being followed by psychiatry as he has an outstanding bill.      Chronic pain syndrome which is multifactorial and overlapping: Hx of TBI Hx of C4 spinal cord injury Postlaminectomy syndrome cervical/lumbar CIDP   These diagnoses have left him with the following symptoms Neck/shoulder girdle/low back pain Neuropathic pain in the LE's Spasticity with resting tone in the RUE Poor mechanics and posture   3. Hx of major depression which has impacted all of these symptoms as we  PAIN:  Are you having pain? {OPRCPAIN:27236}  PRECAUTIONS: {Therapy precautions:24002}  RED FLAGS: {PT Red Flags:29287}   WEIGHT BEARING RESTRICTIONS: {Yes ***/No:24003}  FALLS: Has patient fallen in last 6 months? {fallsyesno:27318}  LIVING ENVIRONMENT: Lives with: {OPRC lives with:25569::"lives with their family"} Lives in: {Lives in:25570} Stairs: {opstairs:27293} Has following equipment at home: {Assistive devices:23999}  PLOF: {PLOF:24004}  PATIENT GOALS: ***  OBJECTIVE:  Note: Objective measures were completed at Evaluation unless otherwise noted.  DIAGNOSTIC FINDINGS:  IMPRESSION: 1. No acute intracranial pathology. 2. No acute/traumatic cervical spine pathology.  COGNITION: Overall cognitive status: {cognition:24006}   SENSATION: {sensation:27233} Decreased LT in both legs and arms,  although he is able to sense pain and gross touch. DTR's are brisk at 3+ throughout   COORDINATION: ***  EDEMA:  {edema:24020}  MUSCLE TONE: {LE tone:25568}  MUSCLE LENGTH: Hamstrings: Right *** deg; Left *** deg Andy Bannister test: Right *** deg; Left *** deg  DTRs:  {DTR SITE:24025}  POSTURE: {posture:25561}  LOWER EXTREMITY ROM:     {AROM/PROM:27142}  Right Eval Left Eval  Hip flexion    Hip extension    Hip abduction    Hip adduction    Hip internal rotation    Hip external rotation    Knee flexion    Knee extension    Ankle dorsiflexion    Ankle plantarflexion    Ankle inversion    Ankle eversion     (Blank rows = not tested)  LOWER EXTREMITY MMT:  BLE: 3/5 HF, KE, 3-4/5 APF and 1/5 ADF bilaterally.  MMT Right Eval Left Eval  Hip flexion    Hip extension    Hip abduction    Hip adduction    Hip internal rotation    Hip external rotation    Knee flexion    Knee extension    Ankle dorsiflexion    Ankle plantarflexion    Ankle inversion    Ankle eversion    (Blank rows = not tested)  BED MOBILITY:  {bed mobility:32615:p}  TRANSFERS: {transfers eval:32620}  RAMP:  {ramp eval:32616}  CURB:  {curb eval:32617}  STAIRS: {stairs eval:32618} GAIT: Findings: {GaitneuroPT:32644::"Distance walked: ***","Comments: ***"}  FUNCTIONAL TESTS:  {Functional tests:24029}  PATIENT SURVEYS:  {rehab surveys:24030}  TREATMENT DATE: ***    PATIENT EDUCATION: Education details: *** Person educated: {Person educated:25204} Education method: {Education Method:25205} Education comprehension: {Education Comprehension:25206}  HOME EXERCISE PROGRAM: ***  GOALS: Goals reviewed with patient? {yes/no:20286}  SHORT TERM GOALS: Target date: ***  *** Baseline: Goal status: INITIAL  2.  *** Baseline:  Goal status: INITIAL  3.   *** Baseline:  Goal status: INITIAL  4.  *** Baseline:  Goal status: INITIAL  5.  *** Baseline:  Goal status: INITIAL  6.  *** Baseline:  Goal status: INITIAL  LONG TERM GOALS: Target date: ***  *** Baseline:  Goal status: INITIAL  2.  *** Baseline:  Goal status: INITIAL  3.  *** Baseline:  Goal status: INITIAL  4.  *** Baseline:  Goal status: INITIAL  5.  *** Baseline:  Goal status: INITIAL  6.  *** Baseline:  Goal status: INITIAL  ASSESSMENT:  CLINICAL IMPRESSION: Patient is a *** y.o. *** who was seen today for physical therapy evaluation and treatment for ***.   OBJECTIVE IMPAIRMENTS: {opptimpairments:25111}.   ACTIVITY LIMITATIONS: {activitylimitations:27494}  PARTICIPATION LIMITATIONS: {participationrestrictions:25113}  PERSONAL FACTORS: {Personal factors:25162} are also affecting patient's functional outcome.   REHAB POTENTIAL: {rehabpotential:25112}  CLINICAL DECISION MAKING: {clinical decision making:25114}  EVALUATION COMPLEXITY: {Evaluation complexity:25115}  PLAN:  PT FREQUENCY: {rehab frequency:25116}  PT DURATION: {rehab duration:25117}  PLANNED INTERVENTIONS: {rehab planned interventions:25118::"97110-Therapeutic exercises","97530- Therapeutic [phone]- Neuromuscular re-education","97535- Self AOZH","08657- Manual therapy"}  PLAN FOR NEXT SESSION: ***   Donavon Fudge, PT 01/22/2024, 5:09 PM

## 2024-01-23 ENCOUNTER — Ambulatory Visit: Attending: Physical Medicine & Rehabilitation

## 2024-01-23 ENCOUNTER — Ambulatory Visit: Admitting: Occupational Therapy

## 2024-01-28 ENCOUNTER — Ambulatory Visit: Admitting: Occupational Therapy

## 2024-02-10 NOTE — Therapy (Addendum)
 OUTPATIENT OCCUPATIONAL THERAPY NEURO EVALUATION  Patient Name: Vincent Zaremba Sr. MRN: [id number] DOB:1966-05-20, 58 y.o., male Today's Date: 02/11/2024  PCP: unknown REFERRING PROVIDER: Dr. Rachel Budds  END OF SESSION:  OT End of Session - 02/11/24 1511     Visit Number 1    Number of Visits 13    Date for OT Re-Evaluation 05/05/24    Authorization Type [employer]    Authorization - Visit Number 1    Progress Note Due on Visit 10    OT Start Time 1318    OT Stop Time 1350    OT Time Calculation (min) 32 min    Activity Tolerance Patient tolerated treatment well    Behavior During Therapy WFL for tasks assessed/performed             Past Medical History:  Diagnosis Date   Brain injury (HCC)    Chronic back pain    History of suicidal ideation 01/2017   with overdose/ [facility] admission   Lumbago with sciatica    left side   Vitamin D  deficiency    Past Surgical History:  Procedure Laterality Date   BACK SURGERY     yrs ago   CLOSED REDUCTION MANDIBLE N/A 05/07/2017   Procedure: CLOSED REDUCTION MANDIBULAR;  Surgeon: Virgina Grills, MD;  Location: [facility] OR;  Service: ENT;  Laterality: N/A;   ESOPHAGOGASTRODUODENOSCOPY N/A 05/07/2017   Procedure: ESOPHAGOGASTRODUODENOSCOPY (EGD);  Surgeon: Dorena Gander, MD;  Location: [facility] OR;  Service: General;  Laterality: N/A;   EXTERNAL FIXATION LEG Left 05/03/2017   Procedure: EXTERNAL FIXATION LEFT LOWER LEG;  Surgeon: Jasmine Mesi, MD;  Location: [facility] OR;  Service: Orthopedics;  Laterality: Left;   EXTERNAL FIXATION REMOVAL Left 05/16/2017   Procedure: REMOVAL EXTERNAL FIXATION LEG;  Surgeon: Laneta Pintos, MD;  Location: [facility] OR;  Service: Orthopedics;  Laterality: Left;   FEMUR IM NAIL Left 05/03/2017   Procedure: LEFT INTRAMEDULLARY (IM) NAIL FEMORAL;  Surgeon: Jasmine Mesi, MD;  Location: [facility] OR;  Service: Orthopedics;  Laterality: Left;   I & D EXTREMITY Left 05/03/2017   Procedure: IRRIGATION AND DEBRIDEMENT LEFT LOWER  EXTREMITY;  Surgeon: Jasmine Mesi, MD;  Location: [facility] OR;  Service: Orthopedics;  Laterality: Left;   I & D EXTREMITY Left 05/07/2017   Procedure: IRRIGATION AND DEBRIDEMENT, LEFT TIBIA  EX-FIX ADJUSTMENT;  Surgeon: Laneta Pintos, MD;  Location: [facility] OR;  Service: Orthopedics;  Laterality: Left;   INCISION AND DRAINAGE OF WOUND Right 05/03/2017   Procedure: IRRIGATION AND DEBRIDEMENT WOUND;  Surgeon: Jasmine Mesi, MD;  Location: [facility] OR;  Service: Orthopedics;  Laterality: Right;   MANDIBULAR HARDWARE REMOVAL N/A 06/26/2017   Procedure: MANDIBULAR HARDWARE REMOVAL;  Surgeon: Virgina Grills, MD;  Location: [facility] OR;  Service: ENT;  Laterality: N/A;   ORIF TIBIA FRACTURE Left 05/16/2017   Procedure: OPEN REDUCTION INTERNAL FIXATION (ORIF) LEFT DISTAL TIBIA FRACTURE, REMOVAL OF EX-FIX;  Surgeon: Laneta Pintos, MD;  Location: [facility] OR;  Service: Orthopedics;  Laterality: Left;   PEG PLACEMENT N/A 05/07/2017   Procedure: PERCUTANEOUS ENDOSCOPIC GASTROSTOMY (PEG) PLACEMENT;  Surgeon: Dorena Gander, MD;  Location: [facility] OR;  Service: General;  Laterality: N/A;   POSTERIOR CERVICAL FUSION/FORAMINOTOMY N/A 05/10/2017   Procedure: POSTERIOR CERVICAL FUSION OCCIPUT- CERVICAL FOUR;  Surgeon: Augusto Blonder, MD;  Location: [facility] OR;  Service: Neurosurgery;  Laterality: N/A;   TRACHEOSTOMY TUBE PLACEMENT N/A 05/07/2017   Procedure: TRACHEOSTOMY;  Surgeon: Virgina Grills, MD;  Location: [facility] OR;  Service: ENT;  Laterality: N/A;   Patient Active Problem List   Diagnosis Date Noted   CIDP (chronic inflammatory demyelinating polyneuropathy) (HCC) 01/01/2024   Cervical post-laminectomy syndrome 01/01/2024   Suicidal behavior with attempted self-injury (HCC) 06/09/2022   Pseudomonas urinary tract infection    Neurogenic bladder    Neck pain    Chronic pain syndrome    Transaminitis    Diffuse TBI w loss of consciousness of unsp duration, init (HCC) 05/21/2017   Tetraplegia (HCC) 05/21/2017   Fracture    Trauma     Polysubstance abuse (HCC)    Fever    Tachycardia    Post-operative pain    Leukocytosis    SIRS (systemic inflammatory response syndrome) (HCC)    Acute blood loss anemia    TBI (traumatic brain injury) (HCC) 05/03/2017   Polysubstance abuse (HCC) 01/12/2017    ONSET DATE: 01/01/24- referral date  REFERRING DIAG: G89.4 (ICD-10-CM) - Chronic pain syndrome G61.81 (ICD-10-CM) - CIDP (chronic inflammatory demyelinating polyneuropathy) (HCC) M96.1 (ICD-10-CM) - Cervical post-laminectomy syndrome  THERAPY DIAG:  Muscle weakness (generalized) - Plan: Ot plan of care cert/re-cert  Other lack of coordination - Plan: Ot plan of care cert/re-cert  Pain in left elbow - Plan: Ot plan of care cert/re-cert  Other abnormalities of gait and mobility - Plan: Ot plan of care cert/re-cert  Stiffness of right hand, not elsewhere classified - Plan: Ot plan of care cert/re-cert  Stiffness of left hand, not elsewhere classified - Plan: Ot plan of care cert/re-cert  Frontal lobe and executive function deficit - Plan: Ot plan of care cert/re-cert  Rationale for Evaluation and Treatment: Rehabilitation  SUBJECTIVE:   SUBJECTIVE STATEMENT: Pt reports pain in back and neck Pt accompanied by: self  PERTINENT HISTORY: Pt. s/p TBI, C4 SCI in 2019 He had cervical fusion C1-3 and C5-6. CIDP. Most recent CT from 2024 demonstrate:  Multilevel degenerative changes.Posterior fusion of the occiput and C1-C3 as well as anterior fusion at C5-C6.  PRECAUTIONS: Fall  WEIGHT BEARING RESTRICTIONS: No  PAIN:  Are you having pain? Yes: NPRS scale:   Pain location: neck and left leg 9/10, L elbow 3/10, back 8/10 Pain description: aching Aggravating factors: pt is out of pain meds Relieving factors: meds  FALLS: Has patient fallen in last 6 months? Yes. Number of falls 1- Pt will see PT to address  LIVING ENVIRONMENT: Lives with: lives with their family and lives with their spouse Lives in:  House/apartment Stairs: No Has following equipment at home: Single point cane, Environmental consultant - 2 wheeled, Environmental consultant - 4 wheeled, Wheelchair (power), and shower chair  PLOF: Needs assistance with ADLs and Needs assistance with homemaking  PATIENT GOALS: improve use of RUE  OBJECTIVE:  Note: Objective measures were completed at Evaluation unless otherwise noted.  HAND DOMINANCE: Right, uses mainly LUE since accident  ADLs:Pt use electic W/C at home or cane at home Uses LUE more consistently due to difficulty using RUE. Transfers/ambulation related to ADLs: Eating: mod I, difficulty cutting food at times Grooming: mod I  UB Dressing: mod I LB Dressing: mod I has sock aide, shoe horn Toileting: mod I, uses catheter Bathing: mod I Tub Shower transfers: mod I Equipment: Shower seat with back and Long handled shoe horn  IADLs:Transfers mod I  Light housekeeping: Pt washes dishes and clothes Meal Prep: Pt does most of the cooking mod I Community mobility: w/c electric Medication management: Pt's wife assists and puts in Mining engineer management: pt's wife handles the finances Handwriting:  100% legible  MOBILITY STATUS: Independent       UPPER EXTREMITY ROM:    Active ROM Right eval Left eval  Shoulder flexion 70 100  Shoulder abduction    Shoulder adduction    Shoulder extension    Shoulder internal rotation    Shoulder external rotation    Elbow flexion  WFL  Elbow extension -40 WFL  Wrist flexion    Wrist extension    Wrist ulnar deviation    Wrist radial deviation    Wrist pronation    Wrist supination 90% 90%  Pt with clawing and flexion at PIP joints for digits 3-5 RUE, therapist is able to passively extend and 5th digit of LUE  UPPER EXTREMITY MMT:   MMT: RUE 3/5 deltoid, 4-/5 biceps, grossly 3/5 triceps  LUE grossly proximal grossly 3+/5, distal  4/5   HAND FUNCTION: Grip strength: Right: 17 lbs; Left: 40 lbs  COORDINATION: 9 Hole Peg test: Right: 59.08  sec; Left: 46.72 sec  SENSATION: impaired in left 5th digit    COGNITION: Overall cognitive status: Impaired, pt reports short term memory difficulties.    VISION ASSESSMENT: Not tested Impaired To be further assessed in functional context  Patient has difficulty with following activities due to following visual impairments: Pt reports vision is blurry at times.    OBSERVATIONS: Pleasant gentleman  motivated to improve.                                                                                                                             TREATMENT DATE: 02/11/24- eval          PATIENT EDUCATION: Education details: role of OT, potential goals Person educated: Patient Education method: Explanation Education comprehension: verbalized understanding  HOME EXERCISE PROGRAM: n/a   GOALS: Goals reviewed with patient? yes  SHORT TERM GOALS: Target date: 03/12/24  I with inital HEP  Goal status: INITIAL  2.  Pt will demonstrate improved RUE functional use as evidenced by decreasing 9 hole peg test score to 55 secs or less  Goal status: INITIAL  3.    I with positioning and splinting to minimize pain and  risk for contracture. Goal status: INITIAL  4.  Pt will increase RUE grip strength to 25 lbs or greater for increased functional use. Baseline: RUE 17 lbs, LUE 40 lbs Goal status: INITIAL  5.  Pt will verbalize understanding of compensatory strategies for short term memory deficits.  Goal status: INITIAL  6.  Pt will demonstrate 80* shoulder flexion with RUE for increased functional reach.  Goal status: INITIAL  LONG TERM GOALS: Target date: 05/05/24  I with updated HEP Baseline:  Goal status: INITIAL  2.   Pt will demonstrate improved RUE functional use as evidenced by decreasing 9 hole peg test score to 50 secs or less   Goal status: INITIAL  3.   Pt will demonstrate improved LUE functional use as evidenced by decreasing 9 hole  peg test score to   39 secs or less  Goal status: INITIAL  4.  Pt will report increased ease with using RUE to cut food  Goal status: INITIAL  5.  Pt will report using RUE consistently as his domainant hand , at least 90% of the time for ADLS.  Goal status: INITIAL    ASSESSMENT:  CLINICAL IMPRESSION: Patient is a 58 y.o. male who was seen today for occupational therapy evaluation for Pt. s/p TBI, C4 SCI in 2019 He had cervical fusion C1-3 and C5-6. CIDP. Most recent CT from 2024 demonstrate:  Multilevel degenerative changes.Posterior fusion of the occiput and C1-C3 as well as anterior fusion at C5-C6.Pt presents with the performance deficits below. He can benefit from skilled occupational therapy to address these deficits in order to maximize pt's safety and I with ADLs/IADLs.   PERFORMANCE DEFICITS: in functional skills including ADLs, IADLs, coordination, dexterity, sensation, tone, ROM, strength, pain, flexibility, Fine motor control, Gross motor control, mobility, balance, endurance, decreased knowledge of precautions, decreased knowledge of use of DME, vision, and UE functional use, cognitive skills including memory, and psychosocial skills including coping strategies, environmental adaptation, habits, interpersonal interactions, and routines and behaviors.   IMPAIRMENTS: are limiting patient from ADLs, IADLs, play, leisure, and social participation.   CO-MORBIDITIES: may have co-morbidities  that affects occupational performance. Patient will benefit from skilled OT to address above impairments and improve overall function.  MODIFICATION OR ASSISTANCE TO COMPLETE EVALUATION: No modification of tasks or assist necessary to complete an evaluation.  OT OCCUPATIONAL PROFILE AND HISTORY: Detailed assessment: Review of records and additional review of physical, cognitive, psychosocial history related to current functional performance.  CLINICAL DECISION MAKING: LOW - limited treatment options, no task  modification necessary  REHAB POTENTIAL: Good  EVALUATION COMPLEXITY: Low    PLAN:  OT FREQUENCY: 1x/week plus eval  OT DURATION: 12 weeks  PLANNED INTERVENTIONS: 97168 OT Re-evaluation, 97535 self care/ADL training, 96045 therapeutic exercise, 97530 therapeutic activity, 97112 neuromuscular re-education, 97140 manual therapy, 97113 aquatic therapy, 97035 ultrasound, 97018 paraffin, 40981 moist heat, 97010 cryotherapy, 97014 electrical stimulation unattended, 97129 Cognitive training (first 15 min), 19147 Cognitive training(each additional 15 min), 82956 Orthotic Initial, 97763 Orthotic/Prosthetic subsequent, manual lymph drainage, passive range of motion, balance training, functional mobility training, visual/perceptual remediation/compensation, psychosocial skills training, energy conservation, coping strategies training, patient/family education, and DME and/or AE instructions  RECOMMENDED OTHER SERVICES: n/a  CONSULTED AND AGREED WITH PLAN OF CARE: Patient  PLAN FOR NEXT SESSION: initial HEP, assess splinting needs, anti-claw?   Kalsey Lull, OT 02/11/2024, 3:27 PM

## 2024-02-11 ENCOUNTER — Ambulatory Visit: Attending: Physical Medicine & Rehabilitation | Admitting: Physical Therapy

## 2024-02-11 ENCOUNTER — Other Ambulatory Visit: Payer: Self-pay

## 2024-02-11 ENCOUNTER — Encounter: Payer: Self-pay | Admitting: Physical Therapy

## 2024-02-11 ENCOUNTER — Ambulatory Visit: Admitting: Occupational Therapy

## 2024-02-11 DIAGNOSIS — M25522 Pain in left elbow: Secondary | ICD-10-CM | POA: Diagnosis present

## 2024-02-11 DIAGNOSIS — G894 Chronic pain syndrome: Secondary | ICD-10-CM | POA: Insufficient documentation

## 2024-02-11 DIAGNOSIS — M6281 Muscle weakness (generalized): Secondary | ICD-10-CM | POA: Insufficient documentation

## 2024-02-11 DIAGNOSIS — M25642 Stiffness of left hand, not elsewhere classified: Secondary | ICD-10-CM

## 2024-02-11 DIAGNOSIS — M25641 Stiffness of right hand, not elsewhere classified: Secondary | ICD-10-CM | POA: Insufficient documentation

## 2024-02-11 DIAGNOSIS — R278 Other lack of coordination: Secondary | ICD-10-CM | POA: Insufficient documentation

## 2024-02-11 DIAGNOSIS — R41844 Frontal lobe and executive function deficit: Secondary | ICD-10-CM

## 2024-02-11 DIAGNOSIS — M542 Cervicalgia: Secondary | ICD-10-CM | POA: Diagnosis present

## 2024-02-11 DIAGNOSIS — R2689 Other abnormalities of gait and mobility: Secondary | ICD-10-CM | POA: Diagnosis present

## 2024-02-11 DIAGNOSIS — S069X3S Unspecified intracranial injury 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, sequela: Secondary | ICD-10-CM | POA: Insufficient documentation

## 2024-02-11 DIAGNOSIS — G6181 Chronic inflammatory demyelinating polyneuritis: Secondary | ICD-10-CM | POA: Diagnosis not present

## 2024-02-11 DIAGNOSIS — M961 Postlaminectomy syndrome, not elsewhere classified: Secondary | ICD-10-CM | POA: Insufficient documentation

## 2024-02-11 DIAGNOSIS — M5459 Other low back pain: Secondary | ICD-10-CM | POA: Diagnosis present

## 2024-02-11 DIAGNOSIS — R262 Difficulty in walking, not elsewhere classified: Secondary | ICD-10-CM | POA: Diagnosis present
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
## 2024-02-12 ENCOUNTER — Encounter: Payer: Self-pay | Admitting: Physical Medicine & Rehabilitation

## 2024-02-12 ENCOUNTER — Encounter: Attending: Physical Medicine & Rehabilitation | Admitting: Physical Medicine & Rehabilitation

## 2024-02-12 VITALS — BP 129/84 | HR 86 | Ht 65.0 in

## 2024-02-12 DIAGNOSIS — G8250 Quadriplegia, unspecified: Secondary | ICD-10-CM | POA: Insufficient documentation

## 2024-02-12 DIAGNOSIS — G894 Chronic pain syndrome: Secondary | ICD-10-CM | POA: Diagnosis not present

## 2024-02-12 DIAGNOSIS — M961 Postlaminectomy syndrome, not elsewhere classified: Secondary | ICD-10-CM | POA: Insufficient documentation

## 2024-02-12 MED ORDER — XTAMPZA ER 9 MG PO C12A
9.0000 mg | EXTENDED_RELEASE_CAPSULE | Freq: Two times a day (BID) | ORAL | 0 refills | Status: DC
Start: 2024-02-12 — End: 2024-02-20

## 2024-02-12 MED ORDER — OXYCODONE-ACETAMINOPHEN 7.5-325 MG PO TABS
1.0000 | ORAL_TABLET | Freq: Two times a day (BID) | ORAL | 0 refills | Status: AC | PRN
Start: 2024-02-12 — End: ?

## 2024-02-12 NOTE — Patient Instructions (Signed)
 ALWAYS FEEL FREE TO CALL OUR OFFICE WITH ANY PROBLEMS OR QUESTIONS [phone])  **PLEASE NOTE** ALL MEDICATION REFILL REQUESTS (INCLUDING CONTROLLED SUBSTANCES) NEED TO BE MADE AT LEAST 7 DAYS PRIOR TO REFILL BEING DUE. ANY REFILL REQUESTS INSIDE THAT TIME FRAME MAY RESULT IN DELAYS IN RECEIVING YOUR PRESCRIPTION.

## 2024-02-12 NOTE — Progress Notes (Addendum)
 Subjective:    Patient ID: Vincent Duty Sr., male    DOB: 12-02-1965, 58 y.o.   MRN: [id number]  HPI [name] is here in follow up of his chronic pain syndrome. He fell the other day and exacerbated his neck. His w/c foot rest hit the cement and tipped his chair.  He has some generalized pain from head to toe.  The Percocet helps to an extent when he has used it.  He is currently out of the supply he was given.  I only gave him 45 after last visit.  Again he asked me whether he could be using CBD cream or Gummies while he is taking controlled substances today.  His drug test at last visit did show a small amount of THC.  He did start in therapies over [employer] with both PT and OT.  They did evaluations primarily.  He is anxious to get moving some more and to build up some lower extremity strength.  We have discussed potentially AFOs for his lower extremities, particularly the right lower extremity.  I asked him if gabapentin  was helping him much from a standpoint of his neuropathic pain and he stated that it might be helping somewhat but is not really sure.  He has not had any issues tolerating the medication.Aaron Aas  He is still having swelling in his leg. [facility] sees him in his home and helps to manage his basic health issues. He was recently placed on zpack for a potential bronchitis.    Pain Inventory Average Pain 9 Pain Right Now 6 My pain is constant, sharp, burning, dull, stabbing, tingling, and aching  In the last 24 hours, has pain interfered with the following? General activity 9 Relation with others 10 Enjoyment of life 8 What TIME of day is your pain at its worst? morning , daytime, evening, and night Sleep (in general) Poor  Pain is worse with: walking, sitting, standing, and some activites Pain improves with: medication, TENS, and heat Relief from Meds: 8  Family History  Problem Relation Age of Onset   Hypothyroidism Mother    Hypertension  Father    Social History   Socioeconomic History   Marital status: Married    Spouse name: Tyra Galley   Number of children: 4   Years of education: 12   Highest education level: Not on file  Occupational History    Comment: unemployed  Tobacco Use   Smoking status: Every Day    Current packs/day: 0.50    Average packs/day: 0.5 packs/day for 8.0 years (4.0 ttl pk-yrs)    Types: Cigarettes   Smokeless tobacco: Never  Vaping Use   Vaping status: Former  Substance and Sexual Activity   Alcohol  use: Not Currently    Comment: rare   Drug use: Not Currently   Sexual activity: Not on file  Other Topics Concern   Not on file  Social History Narrative   ** Merged History Encounter **       Lives at home wife, children Caffeine use- coffee 2-3 cups    Social Drivers of Health   Financial Resource Strain: Low Risk  (12/29/2020)   Overall Financial Resource Strain (CARDIA)    Difficulty of Paying Living Expenses: Not very hard  Food Insecurity: No Food Insecurity (10/20/2021)   Hunger Vital Sign    Worried About Running Out of Food in the Last Year: Never true    Ran Out of Food in the Last Year:  Never true  Transportation Needs: No Transportation Needs (10/20/2021)   PRAPARE - Administrator, Civil Service (Medical): No    Lack of Transportation (Non-Medical): No  Physical Activity: Inactive (12/29/2020)   Exercise Vital Sign    Days of Exercise per Week: 0 days    Minutes of Exercise per Session: 0 min  Stress: Stress Concern Present (12/29/2020)   [employer] of Occupational Health - Occupational Stress Questionnaire    Feeling of Stress : Very much  Social Connections: Unknown (01/21/2022)   [facility] from [facility]   Social Network    Social Network: Not on file   Past Surgical History:  Procedure Laterality Date   BACK SURGERY     yrs ago   CLOSED REDUCTION MANDIBLE N/A 05/07/2017   Procedure: CLOSED REDUCTION MANDIBULAR;  Surgeon:  Virgina Grills, MD;  Location: [facility] OR;  Service: ENT;  Laterality: N/A;   ESOPHAGOGASTRODUODENOSCOPY N/A 05/07/2017   Procedure: ESOPHAGOGASTRODUODENOSCOPY (EGD);  Surgeon: Dorena Gander, MD;  Location: [facility] OR;  Service: General;  Laterality: N/A;   EXTERNAL FIXATION LEG Left 05/03/2017   Procedure: EXTERNAL FIXATION LEFT LOWER LEG;  Surgeon: Jasmine Mesi, MD;  Location: [facility] OR;  Service: Orthopedics;  Laterality: Left;   EXTERNAL FIXATION REMOVAL Left 05/16/2017   Procedure: REMOVAL EXTERNAL FIXATION LEG;  Surgeon: Laneta Pintos, MD;  Location: [facility] OR;  Service: Orthopedics;  Laterality: Left;   FEMUR IM NAIL Left 05/03/2017   Procedure: LEFT INTRAMEDULLARY (IM) NAIL FEMORAL;  Surgeon: Jasmine Mesi, MD;  Location: [facility] OR;  Service: Orthopedics;  Laterality: Left;   I & D EXTREMITY Left 05/03/2017   Procedure: IRRIGATION AND DEBRIDEMENT LEFT LOWER EXTREMITY;  Surgeon: Jasmine Mesi, MD;  Location: [facility] OR;  Service: Orthopedics;  Laterality: Left;   I & D EXTREMITY Left 05/07/2017   Procedure: IRRIGATION AND DEBRIDEMENT, LEFT TIBIA  EX-FIX ADJUSTMENT;  Surgeon: Laneta Pintos, MD;  Location: [facility] OR;  Service: Orthopedics;  Laterality: Left;   INCISION AND DRAINAGE OF WOUND Right 05/03/2017   Procedure: IRRIGATION AND DEBRIDEMENT WOUND;  Surgeon: Jasmine Mesi, MD;  Location: [facility] OR;  Service: Orthopedics;  Laterality: Right;   MANDIBULAR HARDWARE REMOVAL N/A 06/26/2017   Procedure: MANDIBULAR HARDWARE REMOVAL;  Surgeon: Virgina Grills, MD;  Location: [facility] OR;  Service: ENT;  Laterality: N/A;   ORIF TIBIA FRACTURE Left 05/16/2017   Procedure: OPEN REDUCTION INTERNAL FIXATION (ORIF) LEFT DISTAL TIBIA FRACTURE, REMOVAL OF EX-FIX;  Surgeon: Laneta Pintos, MD;  [facility] OR;  Service: Orthopedics;  Laterality: Left;   PEG PLACEMENT N/A 05/07/2017   Procedure: PERCUTANEOUS ENDOSCOPIC GASTROSTOMY (PEG) PLACEMENT;  Surgeon: Dorena Gander, MD;  Location: [facility] OR;  Service: General;   Laterality: N/A;   POSTERIOR CERVICAL FUSION/FORAMINOTOMY N/A 05/10/2017   Procedure: POSTERIOR CERVICAL FUSION OCCIPUT- CERVICAL FOUR;  Surgeon: Augusto Blonder, MD;  Location: [facility] OR;  Service: Neurosurgery;  Laterality: N/A;   TRACHEOSTOMY TUBE PLACEMENT N/A 05/07/2017   Procedure: TRACHEOSTOMY;  Surgeon: Virgina Grills, MD;  Location: [facility] OR;  Service: ENT;  Laterality: N/A;   Past Surgical History:  Procedure Laterality Date   BACK SURGERY     yrs ago   CLOSED REDUCTION MANDIBLE N/A 05/07/2017   Procedure: CLOSED REDUCTION MANDIBULAR;  Surgeon: Virgina Grills, MD;  Location: [facility] OR;  Service: ENT;  Laterality: N/A;   ESOPHAGOGASTRODUODENOSCOPY N/A 05/07/2017   Procedure: ESOPHAGOGASTRODUODENOSCOPY (EGD);  Surgeon: Dorena Gander, MD;  Location: [facility] OR;  Service: General;  Laterality:  N/A;   EXTERNAL FIXATION LEG Left 05/03/2017   Procedure: EXTERNAL FIXATION LEFT LOWER LEG;  Surgeon: Jasmine Mesi, MD;  Location: [facility] OR;  Service: Orthopedics;  Laterality: Left;   EXTERNAL FIXATION REMOVAL Left 05/16/2017   Procedure: REMOVAL EXTERNAL FIXATION LEG;  Surgeon: Laneta Pintos, MD;  [facility] OR;  Service: Orthopedics;  Laterality: Left;   FEMUR IM NAIL Left 05/03/2017   Procedure: LEFT INTRAMEDULLARY (IM) NAIL FEMORAL;  Surgeon: Jasmine Mesi, MD;  Location: [facility] OR;  Service: Orthopedics;  Laterality: Left;   I & D EXTREMITY Left 05/03/2017   Procedure: IRRIGATION AND DEBRIDEMENT LEFT LOWER EXTREMITY;  Surgeon: Jasmine Mesi, MD;  Location: [facility] OR;  Service: Orthopedics;  Laterality: Left;   I & D EXTREMITY Left 05/07/2017   Procedure: IRRIGATION AND DEBRIDEMENT, LEFT TIBIA  EX-FIX ADJUSTMENT;  Surgeon: Laneta Pintos, MD;  [facility] OR;  Service: Orthopedics;  Laterality: Left;   INCISION AND DRAINAGE OF WOUND Right 05/03/2017   Procedure: IRRIGATION AND DEBRIDEMENT WOUND;  Surgeon: Jasmine Mesi, MD;  Location: [facility] OR;  Service: Orthopedics;  Laterality: Right;    MANDIBULAR HARDWARE REMOVAL N/A 06/26/2017   Procedure: MANDIBULAR HARDWARE REMOVAL;  Surgeon: Virgina Grills, MD;  Location: [facility] OR;  Service: ENT;  Laterality: N/A;   ORIF TIBIA FRACTURE Left 05/16/2017   Procedure: OPEN REDUCTION INTERNAL FIXATION (ORIF) LEFT DISTAL TIBIA FRACTURE, REMOVAL OF EX-FIX;  Surgeon: Laneta Pintos, MD;  [facility] OR;  Service: Orthopedics;  Laterality: Left;   PEG PLACEMENT N/A 05/07/2017   Procedure: PERCUTANEOUS ENDOSCOPIC GASTROSTOMY (PEG) PLACEMENT;  Surgeon: Dorena Gander, MD;  Location: [facility] OR;  Service: General;  Laterality: N/A;   POSTERIOR CERVICAL FUSION/FORAMINOTOMY N/A 05/10/2017   Procedure: POSTERIOR CERVICAL FUSION OCCIPUT- CERVICAL FOUR;  Surgeon: Augusto Blonder, MD;  Location: [facility] OR;  Service: Neurosurgery;  Laterality: N/A;   TRACHEOSTOMY TUBE PLACEMENT N/A 05/07/2017   Procedure: TRACHEOSTOMY;  Surgeon: Virgina Grills, MD;  Location: [facility] OR;  Service: ENT;  Laterality: N/A;   Past Medical History:  Diagnosis Date   Brain injury (HCC)    Chronic back pain    History of suicidal ideation 01/2017   with overdose/ [facility] admission   Lumbago with sciatica    left side   Vitamin D  deficiency    BP 129/84   Pulse 86   Ht 5\' 5"  (1.651 m)   SpO2 95%   BMI 40.27 kg/m   Opioid Risk Score:   Fall Risk Score:  `1  Depression screen [facility] 2/9     01/01/2024    4:39 PM 06/29/2021   10:37 AM 03/02/2021   10:46 AM 12/29/2020   12:37 PM 11/02/2020   11:44 AM 03/15/2020    4:59 PM 09/24/2019    3:23 PM  Depression screen PHQ 2/9  Decreased Interest 2 3 3  0 3 0 0  Down, Depressed, Hopeless 1 3 2  0 3 2 0  PHQ - 2 Score 3 6 5  0 6 2 0  Altered sleeping 3 2 3  3 2    Tired, decreased energy 2 3 3  3  0   Change in appetite 0 1 3  3  0   Feeling bad or failure about yourself  1 0 1  2 0   Trouble concentrating 2 0 1  0 2   Moving slowly or fidgety/restless 1 0 0  1 2   Suicidal thoughts 0 0 1  1 0   PHQ-9 Score 12 12  17  19 8    Difficult doing  work/chores  Not difficult at all Somewhat difficult  Somewhat difficult Somewhat difficult     Review of Systems  Musculoskeletal:  Positive for back pain, gait problem and neck pain.       Left leg pain  All other systems reviewed and are negative.      Objective:   Physical Exam General: No acute distress HEENT: NCAT, EOMI, oral membranes moist Cards: reg rate  Chest: normal effort Abdomen: Soft, NT, ND Skin: dry, intact Extremities: no edema Psych: pleasant and appropriate  Skin: chronic wound/scarring of LLE, almost psoriatic in appearnace.  Neuro: Alert and oriented x 3. Fair insight and awareness. Fair Memory. Normal language and speech. Cranial nerve exam unremarkable except for his left eye which is weak with medial and vertical movement. MMT: LUE 3+/5 deltoids, 4/5 distally except hand intrinsics which are 4-/5. R.  RUE is 3 to 3+/5 deltoid, 4/5 biceps, triceps and 3-4/5 at hand with flexion contractures of digits 3-5 at least. BLE: 3/5 HF, KE, 3-4/5 APF and 1+/5 ADF right and 2 to 2+ on left. Decreased LT in both legs and arms, although he is able to sense pain and gross touch. DTR's are brisk at 3+ throughout. No abnl resting tone except for right finger flexors which were 1+/4.  Musculoskeletal: limited cervical rom due to his prior surgical fusion. Low back rom limited due to pain and surgery. Lumbar surgery scar noted.  Both shoulder limited in ROM d/t pain           Assessment & Plan:  Chronic pain syndrome which is multifactorial and overlapping: Hx of TBI Hx of C4 spinal cord injury Postlaminectomy syndrome cervical/lumbar CIDP   These diagnoses have left him with the following symptoms Neck/shoulder girdle/low back pain Neuropathic pain in the LE's Spasticity with resting tone in the RUE Poor mechanics and posture   3. Hx of major depression which has impacted all of these symptoms as well         Plan: Continue therapy with [employer] PT, OT to  address posture, balance, rom, orthotics, modaliltes for pain, HEP. Might benefit from AFO especially RLE Keep gabapentin  to 600mg  qid Flexeril  10mg  q8 hr prn Drug swab today. He says he used CBD gummies up to Friday. I am willing to rx percocet 7.5mg  18 prn if he stops. We discussed the slippery slope narcotics can be.  TENS unit for back/shoulders/neck---zynex ---has provided Xtampza  9mg  every q12, #60. Continue percocet 7.5 mg q12 prn for breakthrough pain.  -if xtampza  not covered, will consider other optoins -I was very clear that any CBD use will not be tolerated while taking controlled substances under my care.   We will continue the controlled substance monitoring program, this consists of regular clinic visits, examinations, routine drug screening, pill counts as well as use of [place]  Controlled Substance Reporting System. [place].   Management of depression is important. I can help while he is paying off the bill at his psychiatrist -effexor  for mood -pt does seem positive and motivated Discussed other means for pain mgt including topical remedies, social activities, leisure, church/social supports, etc. Reiterated these today   I spent well 30 minutes reviewing his records, interviewing the patient, examining, and comiing up with a custom treatment plan. Folow up with me or NP in a month.

## 2024-02-13 ENCOUNTER — Telehealth: Payer: Self-pay

## 2024-02-13 NOTE — Telephone Encounter (Signed)
 Xtampza  ER 9MG  Capsule is not covered by insurance. Morphine Buprenorpine maybe covered.   He was able to get the Percocet 7.5 with no problem. Do you want to remove the Xtampza  ER 9 MG Capsule?   Please advise.

## 2024-02-19 ENCOUNTER — Ambulatory Visit: Admitting: Physical Therapy

## 2024-02-20 MED ORDER — MORPHINE SULFATE ER 15 MG PO TBCR: 15.0000 mg | EXTENDED_RELEASE_TABLET | Freq: Two times a day (BID) | ORAL | 0 refills | Status: AC

## 2024-02-20 NOTE — Telephone Encounter (Signed)
 I don't see morphine with bupren as an option. I have ordered ms contin 15mg  q12 #60. This is covered/preferred but is tier 5.

## 2024-02-20 NOTE — Addendum Note (Signed)
 Addended by: Abelino Able T on: 02/20/2024 10:51 AM   Modules accepted: Orders

## 2024-02-25 ENCOUNTER — Encounter: Payer: Self-pay | Admitting: Physical Therapy

## 2024-02-25 ENCOUNTER — Ambulatory Visit: Admitting: Physical Therapy

## 2024-02-25 ENCOUNTER — Ambulatory Visit: Admitting: Occupational Therapy

## 2024-02-25 DIAGNOSIS — R278 Other lack of coordination: Secondary | ICD-10-CM

## 2024-02-25 DIAGNOSIS — S069X3S Unspecified intracranial injury 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, sequela: Secondary | ICD-10-CM

## 2024-02-25 DIAGNOSIS — M25641 Stiffness of right hand, not elsewhere classified: Secondary | ICD-10-CM

## 2024-02-25 DIAGNOSIS — R41844 Frontal lobe and executive function deficit: Secondary | ICD-10-CM

## 2024-02-25 DIAGNOSIS — R262 Difficulty in walking, not elsewhere classified: Secondary | ICD-10-CM

## 2024-02-25 DIAGNOSIS — M25642 Stiffness of left hand, not elsewhere classified: Secondary | ICD-10-CM

## 2024-02-25 DIAGNOSIS — M6281 Muscle weakness (generalized): Secondary | ICD-10-CM

## 2024-02-25 DIAGNOSIS — M5459 Other low back pain: Secondary | ICD-10-CM

## 2024-02-25 DIAGNOSIS — R2689 Other abnormalities of gait and mobility: Secondary | ICD-10-CM

## 2024-02-25 DIAGNOSIS — M542 Cervicalgia: Secondary | ICD-10-CM
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
## 2024-02-25 NOTE — Therapy (Signed)
 OUTPATIENT OCCUPATIONAL THERAPY NEURO EVALUATION  Patient Name: [name]. MRN: [id number] DOB:1966/09/09, 58 y.o., male Today's Date: 02/26/2024  PCP: unknown REFERRING PROVIDER: Dr. Rachel Budds  END OF SESSION:  OT End of Session - 02/26/24 0808     Visit Number 2    Number of Visits 13    Date for OT Re-Evaluation 05/05/24    Authorization Type [employer]    Authorization - Visit Number 2    Progress Note Due on Visit 10    OT Start Time 1449    OT Stop Time 1530    OT Time Calculation (min) 41 min           Past Medical History:  Diagnosis Date   Brain injury (HCC)    Chronic back pain    History of suicidal ideation 01/2017   with overdose/ [facility] admission   Lumbago with sciatica    left side   Vitamin D  deficiency    Past Surgical History:  Procedure Laterality Date   BACK SURGERY     yrs ago   CLOSED REDUCTION MANDIBLE N/A 05/07/2017   Procedure: CLOSED REDUCTION MANDIBULAR;  Surgeon: Virgina Grills, MD;  Location: [facility] OR;  Service: ENT;  Laterality: N/A;   ESOPHAGOGASTRODUODENOSCOPY N/A 05/07/2017   Procedure: ESOPHAGOGASTRODUODENOSCOPY (EGD);  Surgeon: Dorena Gander, MD;  Location: [facility] OR;  Service: General;  Laterality: N/A;   EXTERNAL FIXATION LEG Left 05/03/2017   Procedure: EXTERNAL FIXATION LEFT LOWER LEG;  Surgeon: Jasmine Mesi, MD;  Location: [facility] OR;  Service: Orthopedics;  Laterality: Left;   EXTERNAL FIXATION REMOVAL Left 05/16/2017   Procedure: REMOVAL EXTERNAL FIXATION LEG;  Surgeon: Laneta Pintos, MD;  Location: [facility] OR;  Service: Orthopedics;  Laterality: Left;   FEMUR IM NAIL Left 05/03/2017   Procedure: LEFT INTRAMEDULLARY (IM) NAIL FEMORAL;  Surgeon: Jasmine Mesi, MD;  Location: [facility] OR;  Service: Orthopedics;  Laterality: Left;   I & D EXTREMITY Left 05/03/2017   Procedure: IRRIGATION AND DEBRIDEMENT LEFT LOWER EXTREMITY;  Surgeon: Jasmine Mesi, MD;  Location: [facility] OR;  Service: Orthopedics;  Laterality: Left;   I & D  EXTREMITY Left 05/07/2017   Procedure: IRRIGATION AND DEBRIDEMENT, LEFT TIBIA  EX-FIX ADJUSTMENT;  Surgeon: Laneta Pintos, MD;  Location: [facility] OR;  Service: Orthopedics;  Laterality: Left;   INCISION AND DRAINAGE OF WOUND Right 05/03/2017   Procedure: IRRIGATION AND DEBRIDEMENT WOUND;  Surgeon: Jasmine Mesi, MD;  Location: [facility] OR;  Service: Orthopedics;  Laterality: Right;   MANDIBULAR HARDWARE REMOVAL N/A 06/26/2017   Procedure: MANDIBULAR HARDWARE REMOVAL;  Surgeon: Virgina Grills, MD;  Location: [facility] OR;  Service: ENT;  Laterality: N/A;   ORIF TIBIA FRACTURE Left 05/16/2017   Procedure: OPEN REDUCTION INTERNAL FIXATION (ORIF) LEFT DISTAL TIBIA FRACTURE, REMOVAL OF EX-FIX;  Surgeon: Laneta Pintos, MD;  Location: [facility] OR;  Service: Orthopedics;  Laterality: Left;   PEG PLACEMENT N/A 05/07/2017   Procedure: PERCUTANEOUS ENDOSCOPIC GASTROSTOMY (PEG) PLACEMENT;  Surgeon: Dorena Gander, MD;  Location: [facility] OR;  Service: General;  Laterality: N/A;   POSTERIOR CERVICAL FUSION/FORAMINOTOMY N/A 05/10/2017   Procedure: POSTERIOR CERVICAL FUSION OCCIPUT- CERVICAL FOUR;  Surgeon: Augusto Blonder, MD;  Location: [facility] OR;  Service: Neurosurgery;  Laterality: N/A;   TRACHEOSTOMY TUBE PLACEMENT N/A 05/07/2017   Procedure: TRACHEOSTOMY;  Surgeon: Virgina Grills, MD;  Location: [facility] OR;  Service: ENT;  Laterality: N/A;   Patient Active Problem List   Diagnosis Date Noted   CIDP (chronic inflammatory demyelinating polyneuropathy) (  HCC) 01/01/2024   Cervical post-laminectomy syndrome 01/01/2024   Suicidal behavior with attempted self-injury (HCC) 06/09/2022   Pseudomonas urinary tract infection    Neurogenic bladder    Neck pain    Chronic pain syndrome    Transaminitis    Diffuse TBI w loss of consciousness of unsp duration, init (HCC) 05/21/2017   Tetraplegia (HCC) 05/21/2017   Fracture    Trauma    Polysubstance abuse (HCC)    Fever    Tachycardia    Post-operative pain    Leukocytosis    SIRS (systemic  inflammatory response syndrome) (HCC)    Acute blood loss anemia    TBI (traumatic brain injury) (HCC) 05/03/2017   Polysubstance abuse (HCC) 01/12/2017    ONSET DATE: 01/01/24- referral date  REFERRING DIAG: G89.4 (ICD-10-CM) - Chronic pain syndrome G61.81 (ICD-10-CM) - CIDP (chronic inflammatory demyelinating polyneuropathy) (HCC) M96.1 (ICD-10-CM) - Cervical post-laminectomy syndrome  THERAPY DIAG:  Muscle weakness (generalized)  Other lack of coordination  Stiffness of right hand, not elsewhere classified  Stiffness of left hand, not elsewhere classified  Frontal lobe and executive function deficit  Other abnormalities of gait and mobility  Rationale for Evaluation and Treatment: Rehabilitation  SUBJECTIVE:   SUBJECTIVE STATEMENT: Pt reports he wants to be able to cater Pt accompanied by: self  PERTINENT HISTORY: Pt. s/p TBI, C4 SCI in 2019 He had cervical fusion C1-3 and C5-6. CIDP. Most recent CT from 2024 demonstrate:  Multilevel degenerative changes.Posterior fusion of the occiput and C1-C3 as well as anterior fusion at C5-C6.  PRECAUTIONS: Fall  WEIGHT BEARING RESTRICTIONS: No  PAIN:  Are you having pain? Yes: NPRS scale:   Pain location: neck 9/10,  Pain description: aching Aggravating factors: pt is out of pain meds Relieving factors: meds  FALLS: Has patient fallen in last 6 months? Yes. Number of falls 1- Pt will see PT to address  LIVING ENVIRONMENT: Lives with: lives with their family and lives with their spouse Lives in: House/apartment Stairs: No Has following equipment at home: Single point cane, Environmental consultant - 2 wheeled, Environmental consultant - 4 wheeled, Wheelchair (power), and shower chair  PLOF: Needs assistance with ADLs and Needs assistance with homemaking  PATIENT GOALS: improve use of RUE  OBJECTIVE:  Note: Objective measures were completed at Evaluation unless otherwise noted.  HAND DOMINANCE: Right, uses mainly LUE since accident  ADLs:Pt use  electic W/C at home or cane at home Uses LUE more consistently due to difficulty using RUE. Transfers/ambulation related to ADLs: Eating: mod I, difficulty cutting food at times Grooming: mod I  UB Dressing: mod I LB Dressing: mod I has sock aide, shoe horn Toileting: mod I, uses catheter Bathing: mod I Tub Shower transfers: mod I Equipment: Shower seat with back and Long handled shoe horn  IADLs:Transfers mod I  Light housekeeping: Pt washes dishes and clothes Meal Prep: Pt does most of the cooking mod I Community mobility: w/c electric Medication management: Pt's wife assists and puts in Designer, jewellery: pt's wife handles the finances Handwriting: 100% legible  MOBILITY STATUS: Independent       UPPER EXTREMITY ROM:    Active ROM Right eval Left eval  Shoulder flexion 70 100  Shoulder abduction    Shoulder adduction    Shoulder extension    Shoulder internal rotation    Shoulder external rotation    Elbow flexion  WFL  Elbow extension -40 WFL  Wrist flexion    Wrist extension    Wrist ulnar  deviation    Wrist radial deviation    Wrist pronation    Wrist supination 90% 90%  Pt with clawing and flexion at PIP joints for digits 3-5 RUE, therapist is able to passively extend and 5th digit of LUE  UPPER EXTREMITY MMT:   MMT: RUE 3/5 deltoid, 4-/5 biceps, grossly 3/5 triceps  LUE grossly proximal grossly 3+/5, distal  4/5   HAND FUNCTION: Grip strength: Right: 17 lbs; Left: 40 lbs  COORDINATION: 9 Hole Peg test: Right: 59.08 sec; Left: 46.72 sec  SENSATION: impaired in left 5th digit    COGNITION: Overall cognitive status: Impaired, pt reports short term memory difficulties.    VISION ASSESSMENT: Not tested Impaired To be further assessed in functional context  Patient has difficulty with following activities due to following visual impairments: Pt reports vision is blurry at times.    OBSERVATIONS: Pleasant gentleman  motivated to  improve.                                                                                                                             TREATMENT DATE: 02/25/24 Pt transferred from w/c to cahi with supervision. Pt was instructed initial HEP including IP extension with MP flexion, then coordination HEP. See pt instructions, min-mod v.c initally 02/11/24- eval    PATIENT EDUCATION: Education details: inital HEP Person educated: Patient Education method: Explanation, Demonstration, Tactile cues, Verbal cues, and Handouts Education comprehension: verbalized understanding, returned demonstration, and verbal cues required    HOME EXERCISE PROGRAM: 02/25/24 coordination and IP extension with MP flexion for RUE   GOALS: Goals reviewed with patient? yes  SHORT TERM GOALS: Target date: 03/12/24  I with inital HEP  Goal status: issued ongoing-02/24/24  2.  Pt will demonstrate improved RUE functional use as evidenced by decreasing 9 hole peg test score to 55 secs or less  Goal status:  ongoing 02/25/24  3.    I with positioning and splinting to minimize pain and  risk for contracture. Goal status:  ongoing- 02/25/24  4.  Pt will increase RUE grip strength to 25 lbs or greater for increased functional use. Baseline: RUE 17 lbs, LUE 40 lbs Goal status:  ongoing-02/25/24  5.  Pt will verbalize understanding of compensatory strategies for short term memory deficits.  Goal status: ongoing 02/25/24 6.  Pt will demonstrate 80* shoulder flexion with RUE for increased functional reach.  Goal status:  ongoing-02/25/24  LONG TERM GOALS: Target date: 05/05/24  I with updated HEP Baseline:  Goal status: INITIAL  2.   Pt will demonstrate improved RUE functional use as evidenced by decreasing 9 hole peg test score to 50 secs or less   Goal status: INITIAL  3.   Pt will demonstrate improved LUE functional use as evidenced by decreasing 9 hole peg test score to  39 secs or less  Goal status:  INITIAL  4.  Pt will report increased ease with using RUE to cut food  Goal status: INITIAL  5.  Pt will report using RUE consistently as his domainant hand , at least 90% of the time for ADLS.  Goal status: INITIAL    ASSESSMENT:  CLINICAL IMPRESSION: Patient is progressing towards goals. He demonstrates understanding of inital HEP.  PERFORMANCE DEFICITS: in functional skills including ADLs, IADLs, coordination, dexterity, sensation, tone, ROM, strength, pain, flexibility, Fine motor control, Gross motor control, mobility, balance, endurance, decreased knowledge of precautions, decreased knowledge of use of DME, vision, and UE functional use, cognitive skills including memory, and psychosocial skills including coping strategies, environmental adaptation, habits, interpersonal interactions, and routines and behaviors.   IMPAIRMENTS: are limiting patient from ADLs, IADLs, play, leisure, and social participation.   CO-MORBIDITIES: may have co-morbidities  that affects occupational performance. Patient will benefit from skilled OT to address above impairments and improve overall function.  MODIFICATION OR ASSISTANCE TO COMPLETE EVALUATION: No modification of tasks or assist necessary to complete an evaluation.  OT OCCUPATIONAL PROFILE AND HISTORY: Detailed assessment: Review of records and additional review of physical, cognitive, psychosocial history related to current functional performance.  CLINICAL DECISION MAKING: LOW - limited treatment options, no task modification necessary  REHAB POTENTIAL: Good  EVALUATION COMPLEXITY: Low    PLAN:  OT FREQUENCY: 1x/week plus eval  OT DURATION: 12 weeks  PLANNED INTERVENTIONS: 97168 OT Re-evaluation, 97535 self care/ADL training, 40981 therapeutic exercise, 97530 therapeutic activity, 97112 neuromuscular re-education, 97140 manual therapy, 97113 aquatic therapy, 97035 ultrasound, 97018 paraffin, 19147 moist heat, 97010 cryotherapy,  97014 electrical stimulation unattended, 97129 Cognitive training (first 15 min), 82956 Cognitive training(each additional 15 min), 21308 Orthotic Initial, 97763 Orthotic/Prosthetic subsequent, manual lymph drainage, passive range of motion, balance training, functional mobility training, visual/perceptual remediation/compensation, psychosocial skills training, energy conservation, coping strategies training, patient/family education, and DME and/or AE instructions  RECOMMENDED OTHER SERVICES: n/a  CONSULTED AND AGREED WITH PLAN OF CARE: Patient  PLAN FOR NEXT SESSION: anti-claw splint, functional use of RUE   Cindel Daugherty, OT 02/26/2024, 8:09 AM

## 2024-02-25 NOTE — Patient Instructions (Addendum)
    PIP Extension (Active Controlled With Wrist and MP Flexion)    Using other hand to hold wrist in gentle flexion and big knuckles, straighten end joints of fingers. Repeat __10__ times. Do __2__ sessions per day.  Copyright  [employer]. All rights reserved.    Coordination Activities  Perform the following activities for 10 minutes 1 times per day with both hand(s).  Flip cards 1 at a time  Pick up coins, buttons, marbles, dried beans/pasta of different sizes and place in container. Pick up coins and place in container or coin bank. Pick up coins and stack.for left hand Practice writing and/or typing.

## 2024-02-26 ENCOUNTER — Encounter: Payer: Self-pay | Admitting: Occupational Therapy

## 2024-03-03 ENCOUNTER — Ambulatory Visit: Admitting: Occupational Therapy

## 2024-03-03 ENCOUNTER — Ambulatory Visit: Admitting: Physical Therapy

## 2024-03-09 ENCOUNTER — Encounter: Admitting: Registered Nurse

## 2024-03-10 ENCOUNTER — Ambulatory Visit: Attending: Physical Medicine & Rehabilitation | Admitting: Occupational Therapy

## 2024-03-10 ENCOUNTER — Ambulatory Visit: Admitting: Physical Therapy

## 2024-03-10 NOTE — Therapy (Incomplete)
 OUTPATIENT OCCUPATIONAL THERAPY NEURO EVALUATION  Patient Name: Vincent Kellman Sr. MRN: [id number] DOB:1966-06-30, 58 y.o., male Today's Date: 03/10/2024  PCP: unknown REFERRING PROVIDER: Dr. Babs  END OF SESSION:     Past Medical History:  Diagnosis Date   Brain injury [facility])    Chronic back pain    History of suicidal ideation 01/2017   with overdose/ [facility] admission   Lumbago with sciatica    left side   Vitamin D  deficiency    Past Surgical History:  Procedure Laterality Date   BACK SURGERY     yrs ago   CLOSED REDUCTION MANDIBLE N/A 05/07/2017   Procedure: CLOSED REDUCTION MANDIBULAR;  Surgeon: Carlie Clark, MD;  Location: [facility] OR;  Service: ENT;  Laterality: N/A;   ESOPHAGOGASTRODUODENOSCOPY N/A 05/07/2017   Procedure: ESOPHAGOGASTRODUODENOSCOPY (EGD);  Surgeon: Sebastian Moles, MD;  Location: [facility] OR;  Service: General;  Laterality: N/A;   EXTERNAL FIXATION LEG Left 05/03/2017   Procedure: EXTERNAL FIXATION LEFT LOWER LEG;  Surgeon: Addie Cordella Hamilton, MD;  Location: [facility] OR;  Service: Orthopedics;  Laterality: Left;   EXTERNAL FIXATION REMOVAL Left 05/16/2017   Procedure: REMOVAL EXTERNAL FIXATION LEG;  Surgeon: Kendal Franky SQUIBB, MD;  Location: [facility] OR;  Service: Orthopedics;  Laterality: Left;   FEMUR IM NAIL Left 05/03/2017   Procedure: LEFT INTRAMEDULLARY (IM) NAIL FEMORAL;  Surgeon: Addie Cordella Hamilton, MD;  Location: [facility] OR;  Service: Orthopedics;  Laterality: Left;   I & D EXTREMITY Left 05/03/2017   Procedure: IRRIGATION AND DEBRIDEMENT LEFT LOWER EXTREMITY;  Surgeon: Addie Cordella Hamilton, MD;  Location: [facility] OR;  Service: Orthopedics;  Laterality: Left;   I & D EXTREMITY Left 05/07/2017   Procedure: IRRIGATION AND DEBRIDEMENT, LEFT TIBIA  EX-FIX ADJUSTMENT;  Surgeon: Kendal Franky SQUIBB, MD;  Location: [facility] OR;  Service: Orthopedics;  Laterality: Left;   INCISION AND DRAINAGE OF WOUND Right 05/03/2017   Procedure: IRRIGATION AND DEBRIDEMENT WOUND;  Surgeon: Addie Cordella Hamilton,  MD;  Location: [facility] OR;  Service: Orthopedics;  Laterality: Right;   MANDIBULAR HARDWARE REMOVAL N/A 06/26/2017   Procedure: MANDIBULAR HARDWARE REMOVAL;  Surgeon: Carlie Clark, MD;  Location: [facility] OR;  Service: ENT;  Laterality: N/A;   ORIF TIBIA FRACTURE Left 05/16/2017   Procedure: OPEN REDUCTION INTERNAL FIXATION (ORIF) LEFT DISTAL TIBIA FRACTURE, REMOVAL OF EX-FIX;  Surgeon: Kendal Franky SQUIBB, MD;  Location: [facility] OR;  Service: Orthopedics;  Laterality: Left;   PEG PLACEMENT N/A 05/07/2017   Procedure: PERCUTANEOUS ENDOSCOPIC GASTROSTOMY (PEG) PLACEMENT;  Surgeon: Sebastian Moles, MD;  Location: [facility] OR;  Service: General;  Laterality: N/A;   POSTERIOR CERVICAL FUSION/FORAMINOTOMY N/A 05/10/2017   Procedure: POSTERIOR CERVICAL FUSION OCCIPUT- CERVICAL FOUR;  Surgeon: Lanis Pupa, MD;  Location: [facility] OR;  Service: Neurosurgery;  Laterality: N/A;   TRACHEOSTOMY TUBE PLACEMENT N/A 05/07/2017   Procedure: TRACHEOSTOMY;  Surgeon: Carlie Clark, MD;  Location: [facility] OR;  Service: ENT;  Laterality: N/A;   Patient Active Problem List   Diagnosis Date Noted   CIDP (chronic inflammatory demyelinating polyneuropathy) (HCC) 01/01/2024   Cervical post-laminectomy syndrome 01/01/2024   Suicidal behavior with attempted self-injury (HCC) 06/09/2022   Pseudomonas urinary tract infection    Neurogenic bladder    Neck pain    Chronic pain syndrome    Transaminitis    Diffuse TBI w loss of consciousness of unsp duration, init (HCC) 05/21/2017   Tetraplegia (HCC) 05/21/2017   Fracture    Trauma    Polysubstance abuse (HCC)    Fever  Tachycardia    Post-operative pain    Leukocytosis    SIRS (systemic inflammatory response syndrome) (HCC)    Acute blood loss anemia    TBI (traumatic brain injury) (HCC) 05/03/2017   Polysubstance abuse (HCC) 01/12/2017    ONSET DATE: 01/01/24- referral date  REFERRING DIAG: G89.4 (ICD-10-CM) - Chronic pain syndrome G61.81 (ICD-10-CM) - CIDP (chronic inflammatory  demyelinating polyneuropathy) (HCC) M96.1 (ICD-10-CM) - Cervical post-laminectomy syndrome  THERAPY DIAG:  No diagnosis found.  Rationale for Evaluation and Treatment: Rehabilitation  SUBJECTIVE:   SUBJECTIVE STATEMENT: Pt reports he wants to be able to cater Pt accompanied by: self  PERTINENT HISTORY: Pt. s/p TBI, C4 SCI in 2019 He had cervical fusion C1-3 and C5-6. CIDP. Most recent CT from 2024 demonstrate:  Multilevel degenerative changes.Posterior fusion of the occiput and C1-C3 as well as anterior fusion at C5-C6.  PRECAUTIONS: Fall  WEIGHT BEARING RESTRICTIONS: No  PAIN:  Are you having pain? Yes: NPRS scale:   Pain location: neck 9/10,  Pain description: aching Aggravating factors: pt is out of pain meds Relieving factors: meds  FALLS: Has patient fallen in last 6 months? Yes. Number of falls 1- Pt will see PT to address  LIVING ENVIRONMENT: Lives with: lives with their family and lives with their spouse Lives in: House/apartment Stairs: No Has following equipment at home: Single point cane, Environmental consultant - 2 wheeled, Environmental consultant - 4 wheeled, Wheelchair (power), and shower chair  PLOF: Needs assistance with ADLs and Needs assistance with homemaking  PATIENT GOALS: improve use of RUE  OBJECTIVE:  Note: Objective measures were completed at Evaluation unless otherwise noted.  HAND DOMINANCE: Right, uses mainly LUE since accident  ADLs:Pt use electic W/C at home or cane at home Uses LUE more consistently due to difficulty using RUE. Transfers/ambulation related to ADLs: Eating: mod I, difficulty cutting food at times Grooming: mod I  UB Dressing: mod I LB Dressing: mod I has sock aide, shoe horn Toileting: mod I, uses catheter Bathing: mod I Tub Shower transfers: mod I Equipment: Shower seat with back and Long handled shoe horn  IADLs:Transfers mod I  Light housekeeping: Pt washes dishes and clothes Meal Prep: Pt does most of the cooking mod I Community mobility:  w/c electric Medication management: Pt's wife assists and puts in Designer, jewellery: pt's wife handles the finances Handwriting: 100% legible  MOBILITY STATUS: Independent       UPPER EXTREMITY ROM:    Active ROM Right eval Left eval  Shoulder flexion 70 100  Shoulder abduction    Shoulder adduction    Shoulder extension    Shoulder internal rotation    Shoulder external rotation    Elbow flexion  WFL  Elbow extension -40 WFL  Wrist flexion    Wrist extension    Wrist ulnar deviation    Wrist radial deviation    Wrist pronation    Wrist supination 90% 90%  Pt with clawing and flexion at PIP joints for digits 3-5 RUE, therapist is able to passively extend and 5th digit of LUE  UPPER EXTREMITY MMT:   MMT: RUE 3/5 deltoid, 4-/5 biceps, grossly 3/5 triceps  LUE grossly proximal grossly 3+/5, distal  4/5   HAND FUNCTION: Grip strength: Right: 17 lbs; Left: 40 lbs  COORDINATION: 9 Hole Peg test: Right: 59.08 sec; Left: 46.72 sec  SENSATION: impaired in left 5th digit    COGNITION: Overall cognitive status: Impaired, pt reports short term memory difficulties.    VISION ASSESSMENT: Not  tested Impaired To be further assessed in functional context  Patient has difficulty with following activities due to following visual impairments: Pt reports vision is blurry at times.    OBSERVATIONS: Pleasant gentleman  motivated to improve.                                                                                                                             TREATMENT DATE: 02/25/24 Pt transferred from w/c to cahi with supervision. Pt was instructed initial HEP including IP extension with MP flexion, then coordination HEP. See pt instructions, min-mod v.c initally 02/11/24- eval    PATIENT EDUCATION: Education details: inital HEP Person educated: Patient Education method: Explanation, Demonstration, Tactile cues, Verbal cues, and Handouts Education  comprehension: verbalized understanding, returned demonstration, and verbal cues required    HOME EXERCISE PROGRAM: 02/25/24 coordination and IP extension with MP flexion for RUE   GOALS: Goals reviewed with patient? yes  SHORT TERM GOALS: Target date: 03/12/24  I with inital HEP  Goal status: issued ongoing-02/24/24  2.  Pt will demonstrate improved RUE functional use as evidenced by decreasing 9 hole peg test score to 55 secs or less  Goal status:  ongoing 02/25/24  3.    I with positioning and splinting to minimize pain and  risk for contracture. Goal status:  ongoing- 02/25/24  4.  Pt will increase RUE grip strength to 25 lbs or greater for increased functional use. Baseline: RUE 17 lbs, LUE 40 lbs Goal status:  ongoing-02/25/24  5.  Pt will verbalize understanding of compensatory strategies for short term memory deficits.  Goal status: ongoing 02/25/24 6.  Pt will demonstrate 80* shoulder flexion with RUE for increased functional reach.  Goal status:  ongoing-02/25/24  LONG TERM GOALS: Target date: 05/05/24  I with updated HEP Baseline:  Goal status: INITIAL  2.   Pt will demonstrate improved RUE functional use as evidenced by decreasing 9 hole peg test score to 50 secs or less   Goal status: INITIAL  3.   Pt will demonstrate improved LUE functional use as evidenced by decreasing 9 hole peg test score to  39 secs or less  Goal status: INITIAL  4.  Pt will report increased ease with using RUE to cut food  Goal status: INITIAL  5.  Pt will report using RUE consistently as his domainant hand , at least 90% of the time for ADLS.  Goal status: INITIAL    ASSESSMENT:  CLINICAL IMPRESSION: Patient is progressing towards goals. He demonstrates understanding of inital HEP.  PERFORMANCE DEFICITS: in functional skills including ADLs, IADLs, coordination, dexterity, sensation, tone, ROM, strength, pain, flexibility, Fine motor control, Gross motor control, mobility,  balance, endurance, decreased knowledge of precautions, decreased knowledge of use of DME, vision, and UE functional use, cognitive skills including memory, and psychosocial skills including coping strategies, environmental adaptation, habits, interpersonal interactions, and routines and behaviors.   IMPAIRMENTS: are limiting patient from ADLs, IADLs, play,  leisure, and social participation.   CO-MORBIDITIES: may have co-morbidities  that affects occupational performance. Patient will benefit from skilled OT to address above impairments and improve overall function.  MODIFICATION OR ASSISTANCE TO COMPLETE EVALUATION: No modification of tasks or assist necessary to complete an evaluation.  OT OCCUPATIONAL PROFILE AND HISTORY: Detailed assessment: Review of records and additional review of physical, cognitive, psychosocial history related to current functional performance.  CLINICAL DECISION MAKING: LOW - limited treatment options, no task modification necessary  REHAB POTENTIAL: Good  EVALUATION COMPLEXITY: Low    PLAN:  OT FREQUENCY: 1x/week plus eval  OT DURATION: 12 weeks  PLANNED INTERVENTIONS: 97168 OT Re-evaluation, 97535 self care/ADL training, 02889 therapeutic exercise, 97530 therapeutic activity, 97112 neuromuscular re-education, 97140 manual therapy, 97113 aquatic therapy, 97035 ultrasound, 97018 paraffin, 02989 moist heat, 97010 cryotherapy, 97014 electrical stimulation unattended, 97129 Cognitive training (first 15 min), 02869 Cognitive training(each additional 15 min), 02239 Orthotic Initial, 97763 Orthotic/Prosthetic subsequent, manual lymph drainage, passive range of motion, balance training, functional mobility training, visual/perceptual remediation/compensation, psychosocial skills training, energy conservation, coping strategies training, patient/family education, and DME and/or AE instructions  RECOMMENDED OTHER SERVICES: n/a  CONSULTED AND AGREED WITH PLAN OF CARE:  Patient  PLAN FOR NEXT SESSION: anti-claw splint, functional use of RUE   Phala Schraeder, OT 03/10/2024, 8:15 AM

## 2024-03-11 ENCOUNTER — Ambulatory Visit: Admitting: Registered Nurse

## 2024-03-17 ENCOUNTER — Ambulatory Visit: Admitting: Physical Therapy

## 2024-03-24 ENCOUNTER — Ambulatory Visit: Admitting: Occupational Therapy

## 2024-03-31 ENCOUNTER — Ambulatory Visit: Admitting: Physical Therapy

## 2024-04-07 ENCOUNTER — Ambulatory Visit: Admitting: Occupational Therapy
